# Patient Record
Sex: Female | Born: 1935
Health system: Southern US, Community
[De-identification: ages and names within clinical notes are randomized; demographics above are authoritative.]

## PROBLEM LIST (undated history)

## (undated) DIAGNOSIS — W57XXXA Bitten or stung by nonvenomous insect and other nonvenomous arthropods, initial encounter: Secondary | ICD-10-CM

## (undated) DIAGNOSIS — K635 Polyp of colon: Secondary | ICD-10-CM

## (undated) DIAGNOSIS — R350 Frequency of micturition: Secondary | ICD-10-CM

## (undated) DIAGNOSIS — M8580 Other specified disorders of bone density and structure, unspecified site: Secondary | ICD-10-CM

## (undated) DIAGNOSIS — L03115 Cellulitis of right lower limb: Secondary | ICD-10-CM

## (undated) DIAGNOSIS — H409 Unspecified glaucoma: Secondary | ICD-10-CM

## (undated) DIAGNOSIS — R197 Diarrhea, unspecified: Secondary | ICD-10-CM

## (undated) DIAGNOSIS — N809 Endometriosis, unspecified: Secondary | ICD-10-CM

## (undated) DIAGNOSIS — G43909 Migraine, unspecified, not intractable, without status migrainosus: Secondary | ICD-10-CM

## (undated) DIAGNOSIS — M545 Low back pain, unspecified: Secondary | ICD-10-CM

## (undated) DIAGNOSIS — T8859XA Other complications of anesthesia, initial encounter: Secondary | ICD-10-CM

## (undated) DIAGNOSIS — J479 Bronchiectasis, uncomplicated: Secondary | ICD-10-CM

## (undated) DIAGNOSIS — K219 Gastro-esophageal reflux disease without esophagitis: Secondary | ICD-10-CM

## (undated) DIAGNOSIS — Z973 Presence of spectacles and contact lenses: Secondary | ICD-10-CM

## (undated) DIAGNOSIS — J302 Other seasonal allergic rhinitis: Secondary | ICD-10-CM

## (undated) DIAGNOSIS — D473 Essential (hemorrhagic) thrombocythemia: Principal | ICD-10-CM

## (undated) DIAGNOSIS — Z972 Presence of dental prosthetic device (complete) (partial): Secondary | ICD-10-CM

## (undated) DIAGNOSIS — G250 Essential tremor: Secondary | ICD-10-CM

## (undated) HISTORY — DX: Essential tremor: G25.0

## (undated) HISTORY — DX: Unspecified glaucoma: H40.9

## (undated) HISTORY — DX: Bitten or stung by nonvenomous insect and other nonvenomous arthropods, initial encounter: W57.XXXA

## (undated) HISTORY — DX: Essential (hemorrhagic) thrombocythemia: D47.3

## (undated) HISTORY — DX: Migraine, unspecified, not intractable, without status migrainosus: G43.909

## (undated) HISTORY — PX: TUBAL LIGATION: SHX77

## (undated) HISTORY — DX: Low back pain, unspecified: M54.50

## (undated) HISTORY — DX: Other seasonal allergic rhinitis: J30.2

## (undated) HISTORY — DX: Endometriosis, unspecified: N80.9

## (undated) HISTORY — DX: Low back pain: M54.5

## (undated) HISTORY — DX: Polyp of colon: K63.5

## (undated) HISTORY — DX: Other specified disorders of bone density and structure, unspecified site: M85.80

## (undated) HISTORY — PX: OTHER SURGICAL HISTORY: SHX169

## (undated) HISTORY — DX: Gastro-esophageal reflux disease without esophagitis: K21.9

---

## 1968-09-10 HISTORY — PX: TOTAL ABDOMINAL HYSTERECTOMY: SHX209

## 1998-09-11 ENCOUNTER — Other Ambulatory Visit: Admission: RE | Admit: 1998-09-11 | Discharge: 1998-09-11 | Payer: Self-pay | Admitting: *Deleted

## 1999-04-29 ENCOUNTER — Emergency Department (HOSPITAL_COMMUNITY): Admission: EM | Admit: 1999-04-29 | Discharge: 1999-04-29 | Payer: Self-pay | Admitting: Emergency Medicine

## 1999-04-29 ENCOUNTER — Encounter: Payer: Self-pay | Admitting: Emergency Medicine

## 2006-03-07 ENCOUNTER — Encounter: Admission: RE | Admit: 2006-03-07 | Discharge: 2006-03-07 | Payer: Self-pay | Admitting: General Surgery

## 2010-07-20 ENCOUNTER — Emergency Department (HOSPITAL_COMMUNITY)
Admission: EM | Admit: 2010-07-20 | Discharge: 2010-07-21 | Disposition: A | Discharge status: Home / Self Care | Payer: Medicare Other | Attending: Emergency Medicine | Admitting: Emergency Medicine

## 2010-07-20 DIAGNOSIS — N390 Urinary tract infection, site not specified: Secondary | ICD-10-CM | POA: Insufficient documentation

## 2010-07-20 DIAGNOSIS — H409 Unspecified glaucoma: Secondary | ICD-10-CM | POA: Insufficient documentation

## 2010-07-20 DIAGNOSIS — R112 Nausea with vomiting, unspecified: Secondary | ICD-10-CM | POA: Insufficient documentation

## 2010-07-21 LAB — BASIC METABOLIC PANEL
BUN: 20 mg/dL (ref 6–23)
CO2: 24 mEq/L (ref 19–32)
Calcium: 9.9 mg/dL (ref 8.4–10.5)
Chloride: 98 mEq/L (ref 96–112)
Creatinine, Ser: 0.68 mg/dL (ref 0.50–1.10)
GFR calc Af Amer: >60 mL/min (ref >60)
GFR calc non Af Amer: >60 mL/min (ref >60)
Glucose, Bld: 140 mg/dL — ABNORMAL HIGH (ref 70–99)
Potassium: 3.7 mEq/L (ref 3.5–5.1)
Sodium: 134 mEq/L — ABNORMAL LOW (ref 135–145)

## 2010-07-21 LAB — URINALYSIS, ROUTINE W REFLEX MICROSCOPIC
Bilirubin Urine: NEGATIVE
Glucose, UA: NEGATIVE mg/dL
Hgb urine dipstick: NEGATIVE
Ketones, ur: 15 mg/dL — AB
Nitrite: NEGATIVE
Protein, ur: NEGATIVE mg/dL
Specific Gravity, Urine: 1.023 (ref 1.005–1.030)
Urobilinogen, UA: 0.2 mg/dL (ref 0.0–1.0)
pH: 6 (ref 5.0–8.0)

## 2010-07-21 LAB — URINE MICROSCOPIC-ADD ON

## 2010-07-22 LAB — URINE CULTURE: Culture  Setup Time: 201207110529

## 2010-08-17 ENCOUNTER — Other Ambulatory Visit: Payer: Self-pay | Admitting: Family Medicine

## 2010-08-17 ENCOUNTER — Ambulatory Visit
Admission: RE | Admit: 2010-08-17 | Discharge: 2010-08-17 | Disposition: A | Discharge status: Home / Self Care | Payer: Medicare Other | Source: Ambulatory Visit | Attending: Family Medicine | Admitting: Family Medicine

## 2010-08-17 DIAGNOSIS — M545 Low back pain, unspecified: Secondary | ICD-10-CM

## 2011-02-15 DIAGNOSIS — H6980 Other specified disorders of Eustachian tube, unspecified ear: Secondary | ICD-10-CM | POA: Diagnosis not present

## 2011-05-02 DIAGNOSIS — T148 Other injury of unspecified body region: Secondary | ICD-10-CM | POA: Diagnosis not present

## 2011-05-02 DIAGNOSIS — W57XXXA Bitten or stung by nonvenomous insect and other nonvenomous arthropods, initial encounter: Secondary | ICD-10-CM | POA: Diagnosis not present

## 2011-05-03 DIAGNOSIS — H409 Unspecified glaucoma: Secondary | ICD-10-CM | POA: Diagnosis not present

## 2011-05-03 DIAGNOSIS — H4011X0 Primary open-angle glaucoma, stage unspecified: Secondary | ICD-10-CM | POA: Diagnosis not present

## 2011-05-24 DIAGNOSIS — L259 Unspecified contact dermatitis, unspecified cause: Secondary | ICD-10-CM | POA: Diagnosis not present

## 2011-06-03 DIAGNOSIS — H4011X0 Primary open-angle glaucoma, stage unspecified: Secondary | ICD-10-CM | POA: Diagnosis not present

## 2011-06-03 DIAGNOSIS — H2510 Age-related nuclear cataract, unspecified eye: Secondary | ICD-10-CM | POA: Diagnosis not present

## 2011-06-03 DIAGNOSIS — H409 Unspecified glaucoma: Secondary | ICD-10-CM | POA: Diagnosis not present

## 2011-06-17 DIAGNOSIS — H409 Unspecified glaucoma: Secondary | ICD-10-CM | POA: Diagnosis not present

## 2011-06-17 DIAGNOSIS — H4011X0 Primary open-angle glaucoma, stage unspecified: Secondary | ICD-10-CM | POA: Diagnosis not present

## 2011-08-08 DIAGNOSIS — G43009 Migraine without aura, not intractable, without status migrainosus: Secondary | ICD-10-CM | POA: Diagnosis not present

## 2011-08-08 DIAGNOSIS — J301 Allergic rhinitis due to pollen: Secondary | ICD-10-CM | POA: Diagnosis not present

## 2011-08-08 DIAGNOSIS — Z23 Encounter for immunization: Secondary | ICD-10-CM | POA: Diagnosis not present

## 2011-08-23 DIAGNOSIS — J019 Acute sinusitis, unspecified: Secondary | ICD-10-CM | POA: Diagnosis not present

## 2011-08-30 DIAGNOSIS — J342 Deviated nasal septum: Secondary | ICD-10-CM | POA: Diagnosis not present

## 2011-08-30 DIAGNOSIS — J019 Acute sinusitis, unspecified: Secondary | ICD-10-CM | POA: Diagnosis not present

## 2011-08-30 DIAGNOSIS — J310 Chronic rhinitis: Secondary | ICD-10-CM | POA: Diagnosis not present

## 2011-10-28 DIAGNOSIS — H409 Unspecified glaucoma: Secondary | ICD-10-CM | POA: Diagnosis not present

## 2011-10-28 DIAGNOSIS — H4011X0 Primary open-angle glaucoma, stage unspecified: Secondary | ICD-10-CM | POA: Diagnosis not present

## 2011-12-16 DIAGNOSIS — G43009 Migraine without aura, not intractable, without status migrainosus: Secondary | ICD-10-CM | POA: Diagnosis not present

## 2011-12-28 DIAGNOSIS — S0993XA Unspecified injury of face, initial encounter: Secondary | ICD-10-CM | POA: Diagnosis not present

## 2011-12-30 DIAGNOSIS — L0201 Cutaneous abscess of face: Secondary | ICD-10-CM | POA: Diagnosis not present

## 2011-12-30 DIAGNOSIS — K130 Diseases of lips: Secondary | ICD-10-CM | POA: Diagnosis not present

## 2011-12-30 DIAGNOSIS — L03211 Cellulitis of face: Secondary | ICD-10-CM | POA: Diagnosis not present

## 2012-01-24 DIAGNOSIS — L0201 Cutaneous abscess of face: Secondary | ICD-10-CM | POA: Diagnosis not present

## 2012-01-24 DIAGNOSIS — L03211 Cellulitis of face: Secondary | ICD-10-CM | POA: Diagnosis not present

## 2012-02-27 DIAGNOSIS — H409 Unspecified glaucoma: Secondary | ICD-10-CM | POA: Diagnosis not present

## 2012-02-27 DIAGNOSIS — H4011X0 Primary open-angle glaucoma, stage unspecified: Secondary | ICD-10-CM | POA: Diagnosis not present

## 2012-03-05 DIAGNOSIS — L03211 Cellulitis of face: Secondary | ICD-10-CM | POA: Diagnosis not present

## 2012-03-05 DIAGNOSIS — L0201 Cutaneous abscess of face: Secondary | ICD-10-CM | POA: Diagnosis not present

## 2012-05-09 DIAGNOSIS — Z01419 Encounter for gynecological examination (general) (routine) without abnormal findings: Secondary | ICD-10-CM | POA: Diagnosis not present

## 2012-05-09 DIAGNOSIS — B373 Candidiasis of vulva and vagina: Secondary | ICD-10-CM | POA: Diagnosis not present

## 2012-05-09 DIAGNOSIS — N951 Menopausal and female climacteric states: Secondary | ICD-10-CM | POA: Diagnosis not present

## 2012-05-09 DIAGNOSIS — M899 Disorder of bone, unspecified: Secondary | ICD-10-CM | POA: Diagnosis not present

## 2012-05-09 DIAGNOSIS — Z124 Encounter for screening for malignant neoplasm of cervix: Secondary | ICD-10-CM | POA: Diagnosis not present

## 2012-07-09 DIAGNOSIS — H409 Unspecified glaucoma: Secondary | ICD-10-CM | POA: Diagnosis not present

## 2012-07-09 DIAGNOSIS — H4011X0 Primary open-angle glaucoma, stage unspecified: Secondary | ICD-10-CM | POA: Diagnosis not present

## 2012-07-16 DIAGNOSIS — R229 Localized swelling, mass and lump, unspecified: Secondary | ICD-10-CM | POA: Diagnosis not present

## 2012-07-24 DIAGNOSIS — H01009 Unspecified blepharitis unspecified eye, unspecified eyelid: Secondary | ICD-10-CM | POA: Diagnosis not present

## 2012-07-26 DIAGNOSIS — IMO0001 Reserved for inherently not codable concepts without codable children: Secondary | ICD-10-CM | POA: Diagnosis not present

## 2012-08-09 DIAGNOSIS — H409 Unspecified glaucoma: Secondary | ICD-10-CM | POA: Diagnosis not present

## 2012-08-09 DIAGNOSIS — H4011X0 Primary open-angle glaucoma, stage unspecified: Secondary | ICD-10-CM | POA: Diagnosis not present

## 2012-08-16 DIAGNOSIS — M949 Disorder of cartilage, unspecified: Secondary | ICD-10-CM | POA: Diagnosis not present

## 2012-08-16 DIAGNOSIS — M899 Disorder of bone, unspecified: Secondary | ICD-10-CM | POA: Diagnosis not present

## 2012-08-16 DIAGNOSIS — Z1231 Encounter for screening mammogram for malignant neoplasm of breast: Secondary | ICD-10-CM | POA: Diagnosis not present

## 2012-10-08 DIAGNOSIS — H6980 Other specified disorders of Eustachian tube, unspecified ear: Secondary | ICD-10-CM | POA: Diagnosis not present

## 2012-10-11 DIAGNOSIS — Z23 Encounter for immunization: Secondary | ICD-10-CM | POA: Diagnosis not present

## 2012-10-17 DIAGNOSIS — R229 Localized swelling, mass and lump, unspecified: Secondary | ICD-10-CM | POA: Diagnosis not present

## 2012-10-18 DIAGNOSIS — H93299 Other abnormal auditory perceptions, unspecified ear: Secondary | ICD-10-CM | POA: Diagnosis not present

## 2012-10-18 DIAGNOSIS — J310 Chronic rhinitis: Secondary | ICD-10-CM | POA: Diagnosis not present

## 2012-10-18 DIAGNOSIS — H6980 Other specified disorders of Eustachian tube, unspecified ear: Secondary | ICD-10-CM | POA: Diagnosis not present

## 2012-10-18 DIAGNOSIS — R52 Pain, unspecified: Secondary | ICD-10-CM | POA: Diagnosis not present

## 2012-11-28 DIAGNOSIS — H01009 Unspecified blepharitis unspecified eye, unspecified eyelid: Secondary | ICD-10-CM | POA: Diagnosis not present

## 2012-12-13 DIAGNOSIS — H4011X0 Primary open-angle glaucoma, stage unspecified: Secondary | ICD-10-CM | POA: Diagnosis not present

## 2012-12-13 DIAGNOSIS — H409 Unspecified glaucoma: Secondary | ICD-10-CM | POA: Diagnosis not present

## 2012-12-20 DIAGNOSIS — J069 Acute upper respiratory infection, unspecified: Secondary | ICD-10-CM | POA: Diagnosis not present

## 2013-01-15 DIAGNOSIS — H2510 Age-related nuclear cataract, unspecified eye: Secondary | ICD-10-CM | POA: Diagnosis not present

## 2013-01-15 DIAGNOSIS — H409 Unspecified glaucoma: Secondary | ICD-10-CM | POA: Diagnosis not present

## 2013-01-15 DIAGNOSIS — H4011X0 Primary open-angle glaucoma, stage unspecified: Secondary | ICD-10-CM | POA: Diagnosis not present

## 2013-01-18 DIAGNOSIS — M25579 Pain in unspecified ankle and joints of unspecified foot: Secondary | ICD-10-CM | POA: Diagnosis not present

## 2013-02-07 DIAGNOSIS — H2510 Age-related nuclear cataract, unspecified eye: Secondary | ICD-10-CM | POA: Diagnosis not present

## 2013-02-07 DIAGNOSIS — H2589 Other age-related cataract: Secondary | ICD-10-CM | POA: Diagnosis not present

## 2013-02-07 HISTORY — PX: CATARACT EXTRACTION: SUR2

## 2013-02-12 DIAGNOSIS — G43009 Migraine without aura, not intractable, without status migrainosus: Secondary | ICD-10-CM | POA: Diagnosis not present

## 2013-02-12 DIAGNOSIS — R197 Diarrhea, unspecified: Secondary | ICD-10-CM | POA: Diagnosis not present

## 2013-02-28 DIAGNOSIS — H2510 Age-related nuclear cataract, unspecified eye: Secondary | ICD-10-CM | POA: Diagnosis not present

## 2013-02-28 DIAGNOSIS — H2589 Other age-related cataract: Secondary | ICD-10-CM | POA: Diagnosis not present

## 2013-05-15 DIAGNOSIS — J329 Chronic sinusitis, unspecified: Secondary | ICD-10-CM | POA: Diagnosis not present

## 2013-07-01 DIAGNOSIS — H01009 Unspecified blepharitis unspecified eye, unspecified eyelid: Secondary | ICD-10-CM | POA: Diagnosis not present

## 2013-07-01 DIAGNOSIS — H409 Unspecified glaucoma: Secondary | ICD-10-CM | POA: Diagnosis not present

## 2013-07-01 DIAGNOSIS — H4011X0 Primary open-angle glaucoma, stage unspecified: Secondary | ICD-10-CM | POA: Diagnosis not present

## 2013-08-14 DIAGNOSIS — H4011X0 Primary open-angle glaucoma, stage unspecified: Secondary | ICD-10-CM | POA: Diagnosis not present

## 2013-09-10 DIAGNOSIS — D75839 Thrombocytosis, unspecified: Secondary | ICD-10-CM

## 2013-09-10 HISTORY — DX: Thrombocytosis, unspecified: D75.839

## 2013-09-12 DIAGNOSIS — Z23 Encounter for immunization: Secondary | ICD-10-CM | POA: Diagnosis not present

## 2013-09-12 DIAGNOSIS — R259 Unspecified abnormal involuntary movements: Secondary | ICD-10-CM | POA: Diagnosis not present

## 2013-09-12 DIAGNOSIS — R5383 Other fatigue: Secondary | ICD-10-CM | POA: Diagnosis not present

## 2013-09-12 DIAGNOSIS — R5381 Other malaise: Secondary | ICD-10-CM | POA: Diagnosis not present

## 2013-09-12 DIAGNOSIS — R799 Abnormal finding of blood chemistry, unspecified: Secondary | ICD-10-CM | POA: Diagnosis not present

## 2013-09-12 DIAGNOSIS — R197 Diarrhea, unspecified: Secondary | ICD-10-CM | POA: Diagnosis not present

## 2013-09-13 DIAGNOSIS — L570 Actinic keratosis: Secondary | ICD-10-CM | POA: Diagnosis not present

## 2013-09-13 DIAGNOSIS — L821 Other seborrheic keratosis: Secondary | ICD-10-CM | POA: Diagnosis not present

## 2013-09-18 DIAGNOSIS — R7989 Other specified abnormal findings of blood chemistry: Secondary | ICD-10-CM | POA: Diagnosis not present

## 2013-09-20 ENCOUNTER — Telehealth: Payer: Self-pay | Admitting: Hematology

## 2013-09-20 NOTE — Telephone Encounter (Signed)
C/D 09/20/13 for appt. 10/01/13

## 2013-09-20 NOTE — Telephone Encounter (Signed)
S/W PATIENT AND GAVE NP APPT FOR 09/22 @ 1:30 W/DR. SEHBAI.

## 2013-10-01 ENCOUNTER — Encounter: Payer: Self-pay | Admitting: Hematology

## 2013-10-01 ENCOUNTER — Ambulatory Visit (HOSPITAL_BASED_OUTPATIENT_CLINIC_OR_DEPARTMENT_OTHER): Payer: Medicare Other | Admitting: Hematology

## 2013-10-01 ENCOUNTER — Telehealth: Payer: Self-pay | Admitting: Hematology

## 2013-10-01 ENCOUNTER — Encounter (INDEPENDENT_AMBULATORY_CARE_PROVIDER_SITE_OTHER): Payer: Self-pay

## 2013-10-01 ENCOUNTER — Ambulatory Visit: Payer: Medicare Other

## 2013-10-01 VITALS — BP 125/69 | HR 75 | Temp 98.0°F | Resp 18 | Wt 130.2 lb

## 2013-10-01 DIAGNOSIS — D473 Essential (hemorrhagic) thrombocythemia: Secondary | ICD-10-CM | POA: Diagnosis not present

## 2013-10-01 DIAGNOSIS — D75839 Thrombocytosis, unspecified: Secondary | ICD-10-CM

## 2013-10-01 NOTE — Progress Notes (Signed)
[name] HEMATOLOGY CONSULT NOTE 10/01/2013  Patient Care Team: Carlos Levering, PA-C as PCP - General (Family Medicine) Khori Rosevear Marla Roe, MD as Consulting Physician (Hematology)  CHIEF COMPLAINTS/PURPOSE OF CONSULTATION:   Thrombocytosis  HISTORY OF PRESENTING ILLNESS:   Chelsea Garcia 80 y.o. female  from [place] who is being referred here for an elevated platelet count. She recently had blood testing done by her primary care Carlos Levering PA. CBC collected on 09/12/2013 showed a white count of 10.1, hemoglobin 17.4 g, hematocrit 54.7, MCV 87.5, and platelet count of 515. A repeat CBC was done on 09/18/2013. It showed a white count 11.5, hemoglobin 16.5, hematocrit 51, MCV 86 and platelet count was 546. Patient does not have any iron deficiency. She does not have any stressors such as recent surgery, inflammation, infection which can lead to reactive thrombocytosis. Her metabolic panel showed normal glucose, normal kidney function, electrolytes and liver function panel. A serum ferritin was 112. B12 was 521 normal. TSH 7.65 mildly elevated consistent with hypothyroidism. Free T4 0.86 normal. Vitamin D level was 37.2. Tissue transglutaminase IgA was less than 2.  Patient has been very healthy and does not suffer from any chronic ailments. She does have a history of an irritable bowel and bowel habits sent to alternate between constipation and diarrhea. She occasionally gets migraine headaches. She is going to go for a colonoscopy. She does have some fatigue symptoms. She has had tick bites going out door in [place]. She has no prior history of blood disorder. She does have a chronic low back pain. She denies any fevers night sweats or weight loss. She does have history of glaucoma and a cataract which was removed this year. She has never had a cardiovascular event such as MI, CVA, TIA, DVT. She does not take aspirin. She is here for further evaluation.  MEDICAL  HISTORY:  Past Medical History  Diagnosis Date  . Thrombocytosis September 2015  . Migraine     started in 50's  . Glaucoma     started in 40's  . Essential tremor     since 40's  . Seasonal allergies     to Dust, mold and pollen.  . Low back pain     since 40's  . Osteopenia     2014  . Endometriosis     required TAH when young  . Tick bites     she likes out door activities, 2 this year (2015)  . Colon polyps     Benign. 2 colonoscopies in past. Due now again.    SURGICAL HISTORY: Past Surgical History  Procedure Laterality Date  . Total abdominal hysterectomy  1970's  . Tubal ligation    . Cataract extraction Left 02/07/2013    SOCIAL HISTORY: History   Social History  . Marital Status: Married    Spouse Name: N/A    Number of Children: N/A  . Years of Education: N/A   Occupational History  . Not on file.   Social History Main Topics  . Smoking status: Former Smoker -- 0.25 packs/day    Types: Cigarettes    Quit date: 07/01/1961  . Smokeless tobacco: Not on file  . Alcohol Use: No  . Drug Use: No  . Sexual Activity: Yes   Other Topics Concern  . Not on file   Social History Narrative   Patient lives in [place]. Married. Came by herself. She has got 2 sons ages 73 and 67 and 2 grankids  boy 22 and daughter 99. She is active, walks every day, semi-retired accountant.     FAMILY HISTORY: Family History  Problem Relation Age of Onset  . Thyroid disease Mother   . Cancer Father   . Cancer Maternal Aunt 80    ALLERGIES:  has No Known Allergies.  MEDICATIONS:  Current Outpatient Prescriptions  Medication Sig Dispense Refill  . azelastine (ASTELIN) 0.1 % nasal spray Place 1 spray into both nostrils 2 (two) times daily. Use in each nostril as directed      . bacitracin ophthalmic ointment Place 1 application into both eyes 4 (four) times daily. Rub on eyelids      . bimatoprost (LUMIGAN) 0.03 % ophthalmic solution Place 1 drop into both eyes at  bedtime.      . bismuth subsalicylate (PEPTO BISMOL) 262 MG/15ML suspension Take 30 mLs by mouth every 6 (six) hours as needed for diarrhea or loose stools.      . dorzolamide-timolol (COSOPT) 22.3-6.8 MG/ML ophthalmic solution Place 1 drop into both eyes 2 (two) times daily.      . naproxen sodium (ANAPROX) 220 MG tablet Take 220 mg by mouth 2 (two) times daily as needed. This is Aleve.      . SUMAtriptan (IMITREX) 100 MG tablet Take 100 mg by mouth every 2 (two) hours as needed for migraine or headache. May repeat in 2 hours if headache persists or recurs.       No current facility-administered medications for this visit.    REVIEW OF SYSTEMS:   Constitutional: Denies fevers, chills or abnormal night sweats, + Fatigue Eyes: Denies blurriness of vision, double vision or watery eyes Ears, nose, mouth, throat, and face: Denies mucositis or sore throat Respiratory: Denies cough, dyspnea or wheezes, +seasonal allergies Cardiovascular: Denies palpitation, chest discomfort or lower extremity swelling Gastrointestinal:  Denies nausea, heartburn,+ change in bowel habits constipation alt with diarrhea Skin: Denies abnormal skin rashes Lymphatics: Denies new lymphadenopathy or easy bruising Neurological:Denies numbness, tingling or new weaknesses, low back pain Behavioral/Psych: Mood is stable, no new changes  All other systems were reviewed with the patient and are negative.  PHYSICAL EXAMINATION: ECOG PERFORMANCE STATUS: 0  Filed Vitals:   10/01/13 1355  BP: 125/69  Pulse: 75  Temp: 98 F (36.7 C)  Resp: 18   Filed Weights   10/01/13 1355  Weight: 130 lb 3.2 oz (59.058 kg)    GENERAL:alert, no distress and comfortable SKIN: skin color, texture, turgor are normal, no rashes or significant lesions EYES: normal, conjunctiva are pink and non-injected, sclera clear OROPHARYNX:no exudate, no erythema and lips, buccal mucosa, and tongue normal  NECK: supple, thyroid normal size,  non-tender, without nodularity LYMPH:  no palpable lymphadenopathy in the cervical, axillary or inguinal LUNGS: clear to auscultation and percussion with normal breathing effort HEART: regular rate & rhythm and no murmurs and no lower extremity edema ABDOMEN:abdomen soft, non-tender and normal bowel sounds, no HSM Musculoskeletal:no cyanosis of digits and no clubbing  PSYCH: alert & oriented x 3 with fluent speech NEURO: no focal motor/sensory deficits  LABORATORY DATA:  I have reviewed the data as listed  CBC results listed in HPI.  RADIOGRAPHIC STUDIES:  08/17/2010 LUMBAR SPINE - COMPLETE 4+ VIEW Comparison: Left hip film of 08/30/2006 Findings: There is slight exaggeration of the lumbar lordosis. There is degenerative disc disease particularly at L2-3 and L3-4 levels with loss of disc space, sclerosis, and spurring. There is some degenerative change involving the facet joints particularly of L4-5  and L5-S1. The SI joints appear normal. The bowel gas pattern is nonspecific.  IMPRESSION: Degenerative disc disease primarily at L2-3 and L3-4. No acute abnormality.    ASSESSMENT & PLAN:   1. Rhetta Cleek is a pleasant 80 years old female in good health who is being referred here for an abnormal high platelet count.  2. On 2 occasions, she had an elevated platelet count, a phenomenon known as thrombocytosis.  3. Thrombocytosis can increase the risk for cardiovascular events like MI, TIA, CVA, DVT. That is why it is important to treated and control the platelet count and try to keep it in a normal range.  4. One of the common causes for thrombocytosis is iron deficiency. Patient's ferritin level was normal. The other differential includes the possibility of a myeloproliferative disorder such as essential thrombocytosis also known as ET. It is a clonal disorder and can persist for many years. It also increases the risk for myelofibrosis and acute leukemia.   5. An important differential is CML  or chronic myeloid leukemia which can also manifest as an elevated platelet count. I will check a blood test called JAK2 mutation for ET and another test called BCR ABL for CML.   6. I will also order the iron studies and ferritin to be repeated.  7. I advised the patient to start taking an aspirin 325 mg daily for thromboprophylaxis.   8. I am repeating her CBC in 2 months. I will also check a comprehensive metabolic panel, sedimentation rate, serum LDH at the time of followup.  9. Sometimes the thrombocytosis is reactive and may correct itself without any intervention and a two-month followup and establish whether it is our persistent problem or a temporary one. We can also consider the use of hydroxyurea which can effectively bring down the platelet count in the normal range.  10. Patient should continue getting age appropriate screening for colon cancer, breast cancer, cervical cancer.  11. She was also told to avoid going outdoor in the forest and woods because of her history of tick bites and wear proper clothing.  12. I will keep you posted about her progress as her clinical course evolves.  Orders Placed This Encounter  Procedures  . CBC with Differential    Standing Status: Future     Number of Occurrences:      Standing Expiration Date: 10/02/2015  . Sedimentation rate    Standing Status: Future     Number of Occurrences:      Standing Expiration Date: 10/02/2015  . Lactate dehydrogenase (LDH) - [facility]    Standing Status: Future     Number of Occurrences:      Standing Expiration Date: 10/02/2015  . Comprehensive metabolic panel (Cmet) - [facility]    Standing Status: Future     Number of Occurrences:      Standing Expiration Date: 10/02/2015  . bcr/abl    Standing Status: Future     Number of Occurrences:      Standing Expiration Date: 10/02/2015  . [id number]    Standing Status: Future     Number of Occurrences:      Standing Expiration Date: 10/02/2015  . Ferritin     Standing Status: Future     Number of Occurrences:      Standing Expiration Date: 10/02/2015  . Iron and TIBC [facility]    Standing Status: Future     Number of Occurrences:      Standing Expiration Date: 10/02/2015  All questions were answered. The patient knows to call the clinic with any problems, questions or concerns. I spent 45 minutes counseling the patient face to face. The total time spent in the appointment was 60 minutes and more than 50% was on counseling.    Bernadene Bell, MD Medical Hematologist/Oncologist [place] Pager: [phone] Office No: [phone]

## 2013-10-01 NOTE — Progress Notes (Signed)
Checked in new pt with no financial concerns.  Pt has 2 insurances so no financial assistance should be needed but she has Raquel's card for any future questions or concerns.

## 2013-10-01 NOTE — Telephone Encounter (Signed)
Pt confirmed labs/ov per 09/22 POF, gave pt AVS.....KJ °

## 2013-10-07 DIAGNOSIS — R5381 Other malaise: Secondary | ICD-10-CM | POA: Diagnosis not present

## 2013-10-07 DIAGNOSIS — R5383 Other fatigue: Secondary | ICD-10-CM | POA: Diagnosis not present

## 2013-10-30 DIAGNOSIS — R5383 Other fatigue: Secondary | ICD-10-CM | POA: Diagnosis not present

## 2013-11-14 ENCOUNTER — Telehealth: Payer: Self-pay | Admitting: Hematology

## 2013-11-14 NOTE — Telephone Encounter (Signed)
MOVED 11/17 APPT TO 11/18 DUE TO CALL DAY. S/W PT SHE IS AWARE - DAY PER PT CANNOT COME 12PM ON 11/17

## 2013-11-26 ENCOUNTER — Other Ambulatory Visit: Payer: Medicare Other

## 2013-11-26 ENCOUNTER — Ambulatory Visit: Payer: Medicare Other

## 2013-11-27 ENCOUNTER — Ambulatory Visit (HOSPITAL_BASED_OUTPATIENT_CLINIC_OR_DEPARTMENT_OTHER): Payer: Medicare Other | Admitting: Hematology

## 2013-11-27 ENCOUNTER — Encounter: Payer: Self-pay | Admitting: Hematology

## 2013-11-27 ENCOUNTER — Telehealth: Payer: Self-pay | Admitting: Hematology

## 2013-11-27 ENCOUNTER — Other Ambulatory Visit (HOSPITAL_BASED_OUTPATIENT_CLINIC_OR_DEPARTMENT_OTHER): Payer: Medicare Other

## 2013-11-27 VITALS — BP 130/65 | HR 67 | Temp 98.0°F | Resp 18 | Ht 62.0 in | Wt 135.1 lb

## 2013-11-27 DIAGNOSIS — D473 Essential (hemorrhagic) thrombocythemia: Secondary | ICD-10-CM

## 2013-11-27 DIAGNOSIS — D649 Anemia, unspecified: Secondary | ICD-10-CM

## 2013-11-27 DIAGNOSIS — D72829 Elevated white blood cell count, unspecified: Secondary | ICD-10-CM

## 2013-11-27 DIAGNOSIS — D75839 Thrombocytosis, unspecified: Secondary | ICD-10-CM

## 2013-11-27 LAB — COMPREHENSIVE METABOLIC PANEL (CC13)
ALT: 16 U/L (ref 0–55)
AST: 21 U/L (ref 5–34)
Albumin: 4.3 g/dL (ref 3.5–5.0)
Alkaline Phosphatase: 62 U/L (ref 40–150)
Anion Gap: 9 mEq/L (ref 3–11)
BILIRUBIN TOTAL: 0.49 mg/dL (ref 0.20–1.20)
BUN: 23 mg/dL (ref 7.0–26.0)
CHLORIDE: 106 meq/L (ref 98–109)
CO2: 28 mEq/L (ref 22–29)
CREATININE: 0.8 mg/dL (ref 0.6–1.1)
Calcium: 10.1 mg/dL (ref 8.4–10.4)
Glucose: 89 mg/dl (ref 70–140)
Potassium: 4.6 mEq/L (ref 3.5–5.1)
Sodium: 143 mEq/L (ref 136–145)
Total Protein: 6.8 g/dL (ref 6.4–8.3)

## 2013-11-27 LAB — CBC WITH DIFFERENTIAL/PLATELET
BASO%: 0.3 % (ref 0.0–2.0)
Basophils Absolute: 0 10*3/uL (ref 0.0–0.1)
EOS ABS: 0.2 10*3/uL (ref 0.0–0.5)
EOS%: 1.5 % (ref 0.0–7.0)
HCT: 45.9 % (ref 34.8–46.6)
HGB: 15.1 g/dL (ref 11.6–15.9)
LYMPH#: 0.9 10*3/uL (ref 0.9–3.3)
LYMPH%: 7.9 % — ABNORMAL LOW (ref 14.0–49.7)
MCH: 30.1 pg (ref 25.1–34.0)
MCHC: 32.9 g/dL (ref 31.5–36.0)
MCV: 91.6 fL (ref 79.5–101.0)
MONO#: 0.5 10*3/uL (ref 0.1–0.9)
MONO%: 3.9 % (ref 0.0–14.0)
NEUT%: 86.4 % — ABNORMAL HIGH (ref 38.4–76.8)
NEUTROS ABS: 10.1 10*3/uL — AB (ref 1.5–6.5)
NRBC: 0 % (ref 0–0)
Platelets: 646 10*3/uL — ABNORMAL HIGH (ref 145–400)
RBC: 5.01 10*6/uL (ref 3.70–5.45)
RDW: 17.8 % — AB (ref 11.2–14.5)
WBC: 11.7 10*3/uL — ABNORMAL HIGH (ref 3.9–10.3)

## 2013-11-27 LAB — IRON AND TIBC CHCC
%SAT: 46 % (ref 21–57)
Iron: 112 ug/dL (ref 41–142)
TIBC: 245 ug/dL (ref 236–444)
UIBC: 133 ug/dL (ref 120–384)

## 2013-11-27 LAB — SEDIMENTATION RATE: SED RATE: 1 mm/h (ref 0–22)

## 2013-11-27 LAB — LACTATE DEHYDROGENASE (CC13): LDH: 319 U/L — AB (ref 125–245)

## 2013-11-27 LAB — FERRITIN CHCC: Ferritin: 291 ng/ml — ABNORMAL HIGH (ref 9–269)

## 2013-11-27 MED ORDER — HYDROXYUREA 500 MG PO CAPS: 500.0000 mg | ORAL_CAPSULE | Freq: Every day | ORAL | Status: DC

## 2013-11-27 NOTE — Telephone Encounter (Signed)
Gave avs & cal for Dec. °

## 2013-11-27 NOTE — Patient Instructions (Signed)
Written info on hydrea given

## 2013-11-27 NOTE — Progress Notes (Signed)
Chelsea Garcia HEMATOLOGY FOLLOW UP NOTE DATE OF VISIT: 11/27/2013  Patient Care Team: Carlos Levering, PA-C as PCP - General (Family Medicine) Jamarkis Branam Marla Roe, MD as Consulting Physician (Hematology)  CHIEF COMPLAINTS/PURPOSE OF CONSULTATION:   Thrombocytosis  HISTORY OF PRESENTING ILLNESS:   Chelsea Garcia 80 y.o. female  from [place] who was being referred here for an elevated platelet count and seen on 10/01/2013. She recently had blood testing done by her primary care Carlos Levering PA. CBC collected on 09/12/2013 showed a white count of 10.1, hemoglobin 17.4 g, hematocrit 54.7, MCV 87.5, and platelet count of 515. A repeat CBC was done on 09/18/2013. It showed a white count 11.5, hemoglobin 16.5, hematocrit 51, MCV 86 and platelet count was 546. Patient does not have any iron deficiency. She does not have any stressors such as recent surgery, inflammation, infection which can lead to reactive thrombocytosis. Her metabolic panel showed normal glucose, normal kidney function, electrolytes and liver function panel. A serum ferritin was 112. B12 was 521 normal. TSH 7.65 mildly elevated consistent with hypothyroidism. Free T4 0.86 normal. Vitamin D level was 37.2. Tissue transglutaminase IgA was less than 2.  Patient has been very healthy and does not suffer from any chronic ailments. She does have a history of an irritable bowel and bowel habits sent to alternate between constipation and diarrhea. She occasionally gets migraine headaches. She is going to go for a colonoscopy. She does have some fatigue symptoms. She has had tick bites going out door in [place]. She has no prior history of blood disorder. She does have a chronic low back pain. She denies any fevers night sweats or weight loss. She does have history of glaucoma and a cataract which was removed this year. She has never had a cardiovascular event such as MI, CVA, TIA, DVT. She does not take aspirin. She  is here for further evaluation.  INTERVAL HISTORY:  Patient was placed on an aspirin 325 mg daily. I wanted to do a surveillance CBC in 2 months to assess the trend of platelet count figure out whether it is physiological or pathological thrombocytosis. A CBC was done in the office today and clearly shows an increase in the platelet count pointing that we are dealing with a primary bone marrow myeloproliferative disorder likely essential thrombocythemia and less likely CML or JAK-2 to get if myeloproliferative disease. Today patient did undergo a jak 2 mutation, BCR/ABL RT PCR testing, iron studies, citrate and we're waiting for those results. I did discuss with the patient regarding initiation of Hydrea and she will start at a dose of 500 mg daily. I'm giving her a follow-up in 2 weeks to repeat a CBC and go over the lab results. She may need a bone marrow aspirate and biopsy if her Barnabas Lister 2 mutation is negative and BCR ABL testing is negative. She denies any shortness of breath, chest pain, headaches, numbness and tingling in her hands. She is taking aspirin on a daily basis.  MEDICAL HISTORY:  Past Medical History  Diagnosis Date  . Thrombocytosis September 2015  . Migraine     started in 50's  . Glaucoma     started in 40's  . Essential tremor     since 40's  . Seasonal allergies     to Dust, mold and pollen.  . Low back pain     since 40's  . Osteopenia     2014  . Endometriosis     required  TAH when young  . Tick bites     she likes out door activities, 2 this year (2015)  . Colon polyps     Benign. 2 colonoscopies in past. Due now again.    SURGICAL HISTORY: Past Surgical History  Procedure Laterality Date  . Total abdominal hysterectomy  1970's  . Tubal ligation    . Cataract extraction Left 02/07/2013    SOCIAL HISTORY: History   Social History  . Marital Status: Married    Spouse Name: N/A    Number of Children: N/A  . Years of Education: N/A   Occupational  History  . Not on file.   Social History Main Topics  . Smoking status: Former Smoker -- 0.25 packs/day    Types: Cigarettes    Quit date: 07/01/1961  . Smokeless tobacco: Not on file  . Alcohol Use: No  . Drug Use: No  . Sexual Activity: Yes   Other Topics Concern  . Not on file   Social History Narrative   Patient lives in [place]. Married. Came by herself. She has got 2 sons ages 57 and 41 and 2 grankids boy 84 and daughter 3. She is active, walks every day, semi-retired accountant.     FAMILY HISTORY: Family History  Problem Relation Age of Onset  . Thyroid disease Mother   . Cancer Father   . Cancer Maternal Aunt 80    ALLERGIES:  has No Known Allergies.  MEDICATIONS:  Current Outpatient Prescriptions  Medication Sig Dispense Refill  . aspirin 325 MG EC tablet Take 325 mg by mouth daily.    Marland Kitchen azelastine (ASTELIN) 0.1 % nasal spray Place 1 spray into both nostrils 2 (two) times daily. Use in each nostril as directed    . bacitracin ophthalmic ointment Place 1 application into both eyes 4 (four) times daily. Rub on eyelids    . bimatoprost (LUMIGAN) 0.03 % ophthalmic solution Place 1 drop into both eyes at bedtime.    . bismuth subsalicylate (PEPTO BISMOL) 262 MG/15ML suspension Take 30 mLs by mouth every 6 (six) hours as needed for diarrhea or loose stools.    . dorzolamide-timolol (COSOPT) 22.3-6.8 MG/ML ophthalmic solution Place 1 drop into both eyes 2 (two) times daily.    . SUMAtriptan (IMITREX) 100 MG tablet Take 100 mg by mouth every 2 (two) hours as needed for migraine or headache. May repeat in 2 hours if headache persists or recurs.    . hydroxyurea (HYDREA) 500 MG capsule Take 1 capsule (500 mg total) by mouth daily. May take with food to minimize GI side effects. 30 capsule 5   No current facility-administered medications for this visit.    REVIEW OF SYSTEMS:   Constitutional: Denies fevers, chills or abnormal night sweats, + Fatigue Eyes: Denies  blurriness of vision, double vision or watery eyes Ears, nose, mouth, throat, and face: Denies mucositis or sore throat Respiratory: Denies cough, dyspnea or wheezes, +seasonal allergies Cardiovascular: Denies palpitation, chest discomfort or lower extremity swelling Gastrointestinal:  Denies nausea, heartburn,+ change in bowel habits constipation alt with diarrhea Skin: Denies abnormal skin rashes Lymphatics: Denies new lymphadenopathy or easy bruising Neurological:Denies numbness, tingling or new weaknesses, low back pain Behavioral/Psych: Mood is stable, no new changes  All other systems were reviewed with the patient and are negative.  PHYSICAL EXAMINATION: ECOG PERFORMANCE STATUS: 0  Filed Vitals:   11/27/13 1414  BP: 130/65  Pulse: 67  Temp: 98 F (36.7 C)  Resp: 18   Filed Weights  11/27/13 1414  Weight: 135 lb 1.6 oz (61.281 kg)    GENERAL:alert, no distress and comfortable SKIN: skin color, texture, turgor are normal, no rashes or significant lesions EYES: normal, conjunctiva are pink and non-injected, sclera clear OROPHARYNX:no exudate, no erythema and lips, buccal mucosa, and tongue normal  NECK: supple, thyroid normal size, non-tender, without nodularity LYMPH:  no palpable lymphadenopathy in the cervical, axillary or inguinal LUNGS: clear to auscultation and percussion with normal breathing effort HEART: regular rate & rhythm and no murmurs and no lower extremity edema ABDOMEN:abdomen soft, non-tender and normal bowel sounds, no HSM Musculoskeletal:no cyanosis of digits and no clubbing  PSYCH: alert & oriented x 3 with fluent speech NEURO: no focal motor/sensory deficits  LABORATORY DATA:  I have reviewed the data as listed    RADIOGRAPHIC STUDIES:  08/17/2010 LUMBAR SPINE - COMPLETE 4+ VIEW Comparison: Left hip film of 08/30/2006 Findings: There is slight exaggeration of the lumbar lordosis. There is degenerative disc disease particularly at L2-3 and L3-4  levels with loss of disc space, sclerosis, and spurring. There is some degenerative change involving the facet joints particularly of L4-5 and L5-S1. The SI joints appear normal. The bowel gas pattern is nonspecific.  IMPRESSION: Degenerative disc disease primarily at L2-3 and L3-4. No acute abnormality.    ASSESSMENT & PLAN:   1. Chelsea Garcia is a pleasant 80 years old female in good health who is being referred here for an abnormal high platelet count. This is thrombocytosis and likely she has a myeloproliferative disorder. She also have mild anemia and mild leukocytosis.  2. On 2 occasions, she had an elevated platelet count, a phenomenon known as thrombocytosis.  3. Thrombocytosis can increase the risk for cardiovascular events like MI, TIA, CVA, DVT. That is why it is important to treated and control the platelet count and try to keep it in a normal range. I'm starting her today on Hydrea 500 mg daily. Side effects were discussed and she was given written information.  4. One of the common causes for thrombocytosis is iron deficiency. Patient's ferritin level was normal. The other differential includes the possibility of a myeloproliferative disorder such as essential thrombocytosis also known as ET. It is a clonal disorder and can persist for many years. It also increases the risk for myelofibrosis and acute leukemia.   5. An important differential is CML or chronic myeloid leukemia which can also manifest as an elevated platelet count. I will check a blood test called JAK2 mutation for ET and another test called BCR ABL for CML.   6. I will also order the iron studies and ferritin to be repeated.  7. I advised the patient to start taking an aspirin 325 mg daily for thromboprophylaxis.   8. I am repeating her CBC in 2 weeks. I will also check a comprehensive metabolic at the time of followup.  9. Sometimes the thrombocytosis is reactive and may correct itself without any intervention and  a two-month followup and establish whether it is our persistent problem or a temporary one. We can also consider the use of hydroxyurea which can effectively bring down the platelet count in the normal range.  10. Patient should continue getting age appropriate screening for colon cancer, breast cancer, cervical cancer.  11. She was also told to avoid going outdoor in the forest and woods because of her history of tick bites and wear proper clothing.  12. I will keep you posted about her progress as her clinical course  evolves.  Orders Placed This Encounter  Procedures  . CBC with Differential    Standing Status: Future     Number of Occurrences:      Standing Expiration Date: 11/28/2014  . Comprehensive metabolic panel (Cmet) - [facility]    Standing Status: Future     Number of Occurrences:      Standing Expiration Date: 11/28/2014    All questions were answered. The patient knows to call the clinic with any problems, questions or concerns. I spent 20 minutes counseling the patient face to face. The total time spent in the appointment was 25 minutes and more than 50% was on counseling.    Bernadene Bell, MD Medical Hematologist/Oncologist [place] Pager: [phone] Office No: [phone]

## 2013-12-02 DIAGNOSIS — R946 Abnormal results of thyroid function studies: Secondary | ICD-10-CM | POA: Diagnosis not present

## 2013-12-02 DIAGNOSIS — R5383 Other fatigue: Secondary | ICD-10-CM | POA: Diagnosis not present

## 2013-12-02 DIAGNOSIS — R7989 Other specified abnormal findings of blood chemistry: Secondary | ICD-10-CM | POA: Diagnosis not present

## 2013-12-11 DIAGNOSIS — H4011X3 Primary open-angle glaucoma, severe stage: Secondary | ICD-10-CM | POA: Diagnosis not present

## 2013-12-12 ENCOUNTER — Telehealth: Payer: Self-pay | Admitting: Hematology

## 2013-12-12 ENCOUNTER — Other Ambulatory Visit (HOSPITAL_BASED_OUTPATIENT_CLINIC_OR_DEPARTMENT_OTHER): Payer: Medicare Other

## 2013-12-12 ENCOUNTER — Encounter: Payer: Self-pay | Admitting: Hematology

## 2013-12-12 ENCOUNTER — Ambulatory Visit (HOSPITAL_BASED_OUTPATIENT_CLINIC_OR_DEPARTMENT_OTHER): Payer: Medicare Other | Admitting: Hematology

## 2013-12-12 VITALS — BP 107/50 | HR 80 | Temp 97.7°F | Resp 18 | Ht 62.0 in | Wt 135.4 lb

## 2013-12-12 DIAGNOSIS — D72829 Elevated white blood cell count, unspecified: Secondary | ICD-10-CM

## 2013-12-12 DIAGNOSIS — D473 Essential (hemorrhagic) thrombocythemia: Secondary | ICD-10-CM

## 2013-12-12 LAB — CBC WITH DIFFERENTIAL/PLATELET
BASO%: 0.7 % (ref 0.0–2.0)
Basophils Absolute: 0.1 10*3/uL (ref 0.0–0.1)
EOS%: 1.6 % (ref 0.0–7.0)
Eosinophils Absolute: 0.2 10*3/uL (ref 0.0–0.5)
HCT: 45.4 % (ref 34.8–46.6)
HGB: 14.6 g/dL (ref 11.6–15.9)
LYMPH#: 0.8 10*3/uL — AB (ref 0.9–3.3)
LYMPH%: 6.3 % — ABNORMAL LOW (ref 14.0–49.7)
MCH: 29.9 pg (ref 25.1–34.0)
MCHC: 32.1 g/dL (ref 31.5–36.0)
MCV: 93 fL (ref 79.5–101.0)
MONO#: 0.5 10*3/uL (ref 0.1–0.9)
MONO%: 3.9 % (ref 0.0–14.0)
NEUT#: 10.8 10*3/uL — ABNORMAL HIGH (ref 1.5–6.5)
NEUT%: 87.5 % — ABNORMAL HIGH (ref 38.4–76.8)
Platelets: 535 10*3/uL — ABNORMAL HIGH (ref 145–400)
RBC: 4.88 10*6/uL (ref 3.70–5.45)
RDW: 17.8 % — ABNORMAL HIGH (ref 11.2–14.5)
WBC: 12.4 10*3/uL — AB (ref 3.9–10.3)

## 2013-12-12 LAB — COMPREHENSIVE METABOLIC PANEL (CC13)
ALT: 24 U/L (ref 0–55)
ANION GAP: 7 meq/L (ref 3–11)
AST: 20 U/L (ref 5–34)
Albumin: 4.1 g/dL (ref 3.5–5.0)
Alkaline Phosphatase: 59 U/L (ref 40–150)
BILIRUBIN TOTAL: 0.64 mg/dL (ref 0.20–1.20)
BUN: 22.1 mg/dL (ref 7.0–26.0)
CHLORIDE: 107 meq/L (ref 98–109)
CO2: 27 meq/L (ref 22–29)
Calcium: 9.5 mg/dL (ref 8.4–10.4)
Creatinine: 0.8 mg/dL (ref 0.6–1.1)
EGFR: 67 mL/min/{1.73_m2} — ABNORMAL LOW (ref >90)
Glucose: 73 mg/dl (ref 70–140)
Potassium: 4 mEq/L (ref 3.5–5.1)
SODIUM: 141 meq/L (ref 136–145)
TOTAL PROTEIN: 6.5 g/dL (ref 6.4–8.3)

## 2013-12-12 NOTE — Telephone Encounter (Signed)
Gave avs & cal for Jan 2016. °

## 2013-12-12 NOTE — Progress Notes (Signed)
[facility] FOLLOW UP NOTE DATE OF VISIT: 11/27/2013  Patient Care Team: Carlos Levering, PA-C as PCP - General (Family Medicine) Aasim Marla Roe, MD as Consulting Physician (Hematology)  DIAGNOSIS: Essential Thrombocytosis, diagnosed in Sep 2015  CURRENT THERAPY: Hydroxyuria 500mg  daily started 11/27/2013, and ASA 325mg  daily   HISTORY OF PRESENTING ILLNESS:  Chelsea Garcia 80 y.o. female  from [place] who was being referred here for an elevated platelet count and seen on 10/01/2013. She recently had blood testing done by her primary care Carlos Levering PA. CBC collected on 09/12/2013 showed a white count of 10.1, hemoglobin 17.4 g, hematocrit 54.7, MCV 87.5, and platelet count of 515. A repeat CBC was done on 09/18/2013. It showed a white count 11.5, hemoglobin 16.5, hematocrit 51, MCV 86 and platelet count was 546. Patient does not have any iron deficiency. She does not have any stressors such as recent surgery, inflammation, infection which can lead to reactive thrombocytosis. Her metabolic panel showed normal glucose, normal kidney function, electrolytes and liver function panel. A serum ferritin was 112. B12 was 521 normal. TSH 7.65 mildly elevated consistent with hypothyroidism. Free T4 0.86 normal. Vitamin D level was 37.2. Tissue transglutaminase IgA was less than 2.   INTERVAL HISTORY: Jeana returns for follow-up. She started hydroxyurea from last visit about 3 weeks ago. She was previously seen by Dr. Lona Kettle, who has left the practice. She has been tolerating the hydroxyurea very well, no noticeable side effects. She denies any bleeding, fatigue, dizziness, or any others symptoms. No recent hospitalization or other medical events.   Patient Active Problem List   Diagnosis Date Noted  . Thrombocytosis 10/01/2013     MEDICAL HISTORY:  Past Medical History  Diagnosis Date  . Thrombocytosis September 2015  . Migraine     started in  50's  . Glaucoma     started in 40's  . Essential tremor     since 40's  . Seasonal allergies     to Dust, mold and pollen.  . Low back pain     since 40's  . Osteopenia     2014  . Endometriosis     required TAH when young  . Tick bites     she likes out door activities, 2 this year (2015)  . Colon polyps     Benign. 2 colonoscopies in past. Due now again.    SURGICAL HISTORY: Past Surgical History  Procedure Laterality Date  . Total abdominal hysterectomy  1970's  . Tubal ligation    . Cataract extraction Left 02/07/2013    SOCIAL HISTORY: History   Social History  . Marital Status: Married    Spouse Name: N/A    Number of Children: N/A  . Years of Education: N/A   Occupational History  . Not on file.   Social History Main Topics  . Smoking status: Former Smoker -- 0.25 packs/day    Types: Cigarettes    Quit date: 07/01/1961  . Smokeless tobacco: Not on file  . Alcohol Use: No  . Drug Use: No  . Sexual Activity: Yes   Other Topics Concern  . Not on file   Social History Narrative   Patient lives in [place]. Married. Came by herself. She has got 2 sons ages 13 and 60 and 2 grankids boy 107 and daughter 74. She is active, walks every day, semi-retired accountant.     FAMILY HISTORY: Family History  Problem Relation Age of Onset  .  Thyroid disease Mother   . Cancer Father   . Cancer Maternal Aunt 80    ALLERGIES:  has No Known Allergies.  MEDICATIONS:  Current Outpatient Prescriptions  Medication Sig Dispense Refill  . aspirin 325 MG EC tablet Take 325 mg by mouth daily.    Marland Kitchen azelastine (ASTELIN) 0.1 % nasal spray Place 1 spray into both nostrils 2 (two) times daily. Use in each nostril as directed    . bacitracin ophthalmic ointment Place 1 application into both eyes 4 (four) times daily. Rub on eyelids    . bimatoprost (LUMIGAN) 0.03 % ophthalmic solution Place 1 drop into both eyes at bedtime.    . dorzolamide-timolol (COSOPT) 22.3-6.8 MG/ML  ophthalmic solution Place 1 drop into both eyes 2 (two) times daily.    . hydroxyurea (HYDREA) 500 MG capsule Take 1 capsule (500 mg total) by mouth daily. May take with food to minimize GI side effects. 30 capsule 5  . Probiotic Product (PROBIOTIC DAILY PO) Take 1 tablet by mouth daily.    . SUMAtriptan (IMITREX) 100 MG tablet Take 100 mg by mouth every 2 (two) hours as needed for migraine or headache. May repeat in 2 hours if headache persists or recurs.     No current facility-administered medications for this visit.    REVIEW OF SYSTEMS:   Constitutional: Denies fevers, chills or abnormal night sweats, + Fatigue Eyes: Denies blurriness of vision, double vision or watery eyes Ears, nose, mouth, throat, and face: Denies mucositis or sore throat Respiratory: Denies cough, dyspnea or wheezes, +seasonal allergies Cardiovascular: Denies palpitation, chest discomfort or lower extremity swelling Gastrointestinal:  Denies nausea, heartburn,+ change in bowel habits constipation alt with diarrhea Skin: Denies abnormal skin rashes Lymphatics: Denies new lymphadenopathy or easy bruising Neurological:Denies numbness, tingling or new weaknesses, low back pain Behavioral/Psych: Mood is stable, no new changes  All other systems were reviewed with the patient and are negative.  PHYSICAL EXAMINATION: ECOG PERFORMANCE STATUS: 0  Filed Vitals:   12/12/13 0952  BP: 107/50  Pulse: 80  Temp: 97.7 F (36.5 C)  Resp: 18   Filed Weights   12/12/13 0952  Weight: 135 lb 6.4 oz (61.417 kg)    GENERAL:alert, no distress and comfortable SKIN: skin color, texture, turgor are normal, no rashes or significant lesions EYES: normal, conjunctiva are pink and non-injected, sclera clear OROPHARYNX:no exudate, no erythema and lips, buccal mucosa, and tongue normal  NECK: supple, thyroid normal size, non-tender, without nodularity LYMPH:  no palpable lymphadenopathy in the cervical, axillary or inguinal LUNGS:  clear to auscultation and percussion with normal breathing effort HEART: regular rate & rhythm and no murmurs and no lower extremity edema ABDOMEN:abdomen soft, non-tender and normal bowel sounds, no HSM Musculoskeletal:no cyanosis of digits and no clubbing  PSYCH: alert & oriented x 3 with fluent speech NEURO: no focal motor/sensory deficits  LABORATORY DATA:  I have reviewed the data as listed CBC with Differential  Status: Finalresult Visible to patient:  Not Released Nextappt: 01/16/2014 at 09:30 AM in Oncology [facility] 1) Dx:  Essential thrombocythemia              Ref Range 9:30 AM  2wk ago     WBC 3.9 - 10.3 10e3/uL 12.4 (H) 11.7 (H)    NEUT# 1.5 - 6.5 10e3/uL 10.8 (H) 10.1 (H)    HGB 11.6 - 15.9 g/dL 14.6 15.1    HCT 34.8 - 46.6 % 45.4 45.9    Platelets 145 - 400 10e3/uL  535 (H) 646 (H)    MCV 79.5 - 101.0 fL 93.0 91.6    MCH 25.1 - 34.0 pg 29.9 30.1    MCHC 31.5 - 36.0 g/dL 32.1 32.9    RBC 3.70 - 5.45 10e6/uL 4.88 5.01    RDW 11.2 - 14.5 % 17.8 (H) 17.8 (H)    lymph# 0.9 - 3.3 10e3/uL 0.8 (L) 0.9    MONO# 0.1 - 0.9 10e3/uL 0.5 0.5    Eosinophils Absolute 0.0 - 0.5 10e3/uL 0.2 0.2    Basophils Absolute 0.0 - 0.1 10e3/uL 0.1 0.0    NEUT% 38.4 - 76.8 % 87.5 (H) 86.4 (H)    LYMPH% 14.0 - 49.7 % 6.3 (L) 7.9 (L)    MONO% 0.0 - 14.0 % 3.9 3.9    EOS% 0.0 - 7.0 % 1.6 1.5    BASO% 0.0 - 2.0 % 0.7 0.3        Comprehensive metabolic panel (Cmet) - [facility]  Status: Finalresult Visible to patient:  Not Released Nextappt: 01/16/2014 at 09:30 AM in Oncology [facility] 1) Dx:  Essential thrombocythemia              Ref Range 9:30 AM  2wk ago  58yrago     Sodium 136 - 145 mEq/L 141 143 134 (L)R    Potassium 3.5 - 5.1 mEq/L 4.0 4.6 3.7    Chloride 98 - 109 mEq/L 107 106 98R    CO2 22 - 29 mEq/L 27 28 24R    Glucose 70 - 140 mg/dl 73 89 140 (H)R    BUN 7.0 - 26.0 mg/dL 22.1 23.0 20R     Creatinine 0.6 - 1.1 mg/dL 0.8 0.8 0.68R, CM    Total Bilirubin 0.20 - 1.20 mg/dL 0.64 0.49     Alkaline Phosphatase 40 - 150 U/L 59 62     AST 5 - 34 U/L 20 21     ALT 0 - 55 U/L 24 16     Total Protein 6.4 - 8.3 g/dL 6.5 6.8     Albumin 3.5 - 5.0 g/dL 4.1 4.3     Calcium 8.4 - 10.4 mg/dL 9.5 10.1 9.9R    Anion Gap 3 - 11 mEq/L 7 9     EGFR >90 ml/min/1.73 m2 67 (L)           RADIOGRAPHIC STUDIES: No new scans since last visit.   ASSESSMENT & PLAN:  MNikayla Madarisis a pleasant 80years old female in good health who presented with mild leukocytosis and thrombocytosis with dated count around 500-650K. JAK2 mutation was positive, BCR-ABL was negative. She has essential thrombocytosis. No history of thrombosis.  1. Essential thrombocytosis, JAK2(+) -She is tolerating hydroxyurea very well. Her today's count is 12.4, hemoglobin 14.6, platelets 535. We'll continue the current dose. If her platelet count is still above 500k on the visit, I'll probably increase her hydroxyurea to 500 twice daily. -Continue aspirin 325 mg daily. -We discussed the risk of thrombosis. She knows what to watch. -We discussed the goal of therapy is palliative, and prevent thrombosis. Unfortunately it is not curable, and there is a small risk in the involved to acute leukemia down the road.  Follow-up I'll see her back in 1 month's repeated CBC and differential.   Orders Placed This Encounter  Procedures  . CBC with Differential    Standing Status: Future     Number of Occurrences:      Standing Expiration Date: 12/13/2014  .  Comprehensive metabolic panel (Cmet) - [facility]    Standing Status: Future     Number of Occurrences:      Standing Expiration Date: 12/13/2014    All questions were answered. The patient knows to call the clinic with any problems, questions or concerns.  I spent 20 minutes counseling the patient face to face. The total time spent in the appointment was 25 minutes and more than  50% was on counseling.    Truitt Merle  12/12/2013

## 2013-12-20 DIAGNOSIS — H409 Unspecified glaucoma: Secondary | ICD-10-CM | POA: Diagnosis not present

## 2013-12-20 DIAGNOSIS — J301 Allergic rhinitis due to pollen: Secondary | ICD-10-CM | POA: Diagnosis not present

## 2013-12-20 DIAGNOSIS — R251 Tremor, unspecified: Secondary | ICD-10-CM | POA: Diagnosis not present

## 2013-12-20 DIAGNOSIS — R7989 Other specified abnormal findings of blood chemistry: Secondary | ICD-10-CM | POA: Diagnosis not present

## 2013-12-20 DIAGNOSIS — K589 Irritable bowel syndrome without diarrhea: Secondary | ICD-10-CM | POA: Diagnosis not present

## 2013-12-20 DIAGNOSIS — D473 Essential (hemorrhagic) thrombocythemia: Secondary | ICD-10-CM | POA: Diagnosis not present

## 2013-12-20 DIAGNOSIS — R5383 Other fatigue: Secondary | ICD-10-CM | POA: Diagnosis not present

## 2014-01-16 ENCOUNTER — Encounter: Payer: Self-pay | Admitting: Hematology

## 2014-01-16 ENCOUNTER — Telehealth: Payer: Self-pay | Admitting: Hematology

## 2014-01-16 ENCOUNTER — Ambulatory Visit (HOSPITAL_BASED_OUTPATIENT_CLINIC_OR_DEPARTMENT_OTHER): Payer: Medicare Other | Admitting: Hematology

## 2014-01-16 ENCOUNTER — Other Ambulatory Visit (HOSPITAL_BASED_OUTPATIENT_CLINIC_OR_DEPARTMENT_OTHER): Payer: Medicare Other

## 2014-01-16 VITALS — BP 122/57 | HR 76 | Temp 98.2°F | Resp 18 | Ht 62.0 in | Wt 136.3 lb

## 2014-01-16 DIAGNOSIS — D473 Essential (hemorrhagic) thrombocythemia: Secondary | ICD-10-CM | POA: Diagnosis not present

## 2014-01-16 LAB — COMPREHENSIVE METABOLIC PANEL (CC13)
ALT: 13 U/L (ref 0–55)
AST: 18 U/L (ref 5–34)
Albumin: 4.1 g/dL (ref 3.5–5.0)
Alkaline Phosphatase: 57 U/L (ref 40–150)
Anion Gap: 7 mEq/L (ref 3–11)
BILIRUBIN TOTAL: 0.62 mg/dL (ref 0.20–1.20)
BUN: 23.1 mg/dL (ref 7.0–26.0)
CALCIUM: 9.8 mg/dL (ref 8.4–10.4)
CHLORIDE: 108 meq/L (ref 98–109)
CO2: 29 mEq/L (ref 22–29)
CREATININE: 0.8 mg/dL (ref 0.6–1.1)
EGFR: 70 mL/min/{1.73_m2} — ABNORMAL LOW (ref >90)
Glucose: 76 mg/dl (ref 70–140)
Potassium: 4.4 mEq/L (ref 3.5–5.1)
Sodium: 144 mEq/L (ref 136–145)
Total Protein: 6.7 g/dL (ref 6.4–8.3)

## 2014-01-16 LAB — CBC WITH DIFFERENTIAL/PLATELET
BASO%: 0.5 % (ref 0.0–2.0)
Basophils Absolute: 0.1 10*3/uL (ref 0.0–0.1)
EOS%: 1.6 % (ref 0.0–7.0)
Eosinophils Absolute: 0.2 10*3/uL (ref 0.0–0.5)
HEMATOCRIT: 44.1 % (ref 34.8–46.6)
HGB: 14.3 g/dL (ref 11.6–15.9)
LYMPH#: 0.7 10*3/uL — AB (ref 0.9–3.3)
LYMPH%: 6.1 % — ABNORMAL LOW (ref 14.0–49.7)
MCH: 31.7 pg (ref 25.1–34.0)
MCHC: 32.6 g/dL (ref 31.5–36.0)
MCV: 97.5 fL (ref 79.5–101.0)
MONO#: 0.5 10*3/uL (ref 0.1–0.9)
MONO%: 4.5 % (ref 0.0–14.0)
NEUT#: 9.8 10*3/uL — ABNORMAL HIGH (ref 1.5–6.5)
NEUT%: 87.3 % — ABNORMAL HIGH (ref 38.4–76.8)
Platelets: 460 10*3/uL — ABNORMAL HIGH (ref 145–400)
RBC: 4.52 10*6/uL (ref 3.70–5.45)
RDW: 18.9 % — ABNORMAL HIGH (ref 11.2–14.5)
WBC: 11.2 10*3/uL — AB (ref 3.9–10.3)

## 2014-01-16 NOTE — Telephone Encounter (Signed)
Gave avs & cal for Feb. °

## 2014-01-16 NOTE — Progress Notes (Signed)
[facility] HEMATOLOGY FOLLOW UP NOTE DATE OF VISIT: 11/27/2013  Patient Care Team: Carlos Levering, PA-C as PCP - General (Family Medicine) Aasim Marla Roe, MD as Consulting Physician (Hematology)  DIAGNOSIS: Essential Thrombocytosis, diagnosed in Sep 2015  CURRENT THERAPY: Hydroxyuria 500mg  daily started 11/27/2013, and ASA 325mg  daily   HISTORY OF PRESENTING ILLNESS:  Chelsea Garcia 81 y.o. female  from [place] who was being referred here for an elevated platelet count and seen on 10/01/2013. She recently had blood testing done by her primary care Carlos Levering PA. CBC collected on 09/12/2013 showed a white count of 10.1, hemoglobin 17.4 g, hematocrit 54.7, MCV 87.5, and platelet count of 515. A repeat CBC was done on 09/18/2013. It showed a white count 11.5, hemoglobin 16.5, hematocrit 51, MCV 86 and platelet count was 546. Patient does not have any iron deficiency. She does not have any stressors such as recent surgery, inflammation, infection which can lead to reactive thrombocytosis. Her metabolic panel showed normal glucose, normal kidney function, electrolytes and liver function panel. A serum ferritin was 112. B12 was 521 normal. TSH 7.65 mildly elevated consistent with hypothyroidism. Free T4 0.86 normal. Vitamin D level was 37.2. Tissue transglutaminase IgA was less than 2.   INTERVAL HISTORY: Lailana returns for follow-up. She is toerating the hydrea very well, no nausea or other issues. Has goo denergy and appetite, no fever, chills or night sweats. No weight loss.   Patient Active Problem List   Diagnosis Date Noted  . Thrombocytosis 10/01/2013     MEDICAL HISTORY:  Past Medical History  Diagnosis Date  . Thrombocytosis September 2015  . Migraine     started in 50's  . Glaucoma     started in 40's  . Essential tremor     since 40's  . Seasonal allergies     to Dust, mold and pollen.  . Low back pain     since 40's  . Osteopenia      2014  . Endometriosis     required TAH when young  . Tick bites     she likes out door activities, 2 this year (2015)  . Colon polyps     Benign. 2 colonoscopies in past. Due now again.    SURGICAL HISTORY: Past Surgical History  Procedure Laterality Date  . Total abdominal hysterectomy  1970's  . Tubal ligation    . Cataract extraction Left 02/07/2013    SOCIAL HISTORY: History   Social History  . Marital Status: Married    Spouse Name: N/A    Number of Children: N/A  . Years of Education: N/A   Occupational History  . Not on file.   Social History Main Topics  . Smoking status: Former Smoker -- 0.25 packs/day    Types: Cigarettes    Quit date: 07/01/1961  . Smokeless tobacco: Not on file  . Alcohol Use: No  . Drug Use: No  . Sexual Activity: Yes   Other Topics Concern  . Not on file   Social History Narrative   Patient lives in [place]. Married. Came by herself. She has got 2 sons ages 53 and 33 and 2 grankids boy 58 and daughter 52. She is active, walks every day, semi-retired accountant.     FAMILY HISTORY: Family History  Problem Relation Age of Onset  . Thyroid disease Mother   . Cancer Father   . Cancer Maternal Aunt 80    ALLERGIES:  has No Known Allergies.  MEDICATIONS:  Current Outpatient Prescriptions  Medication Sig Dispense Refill  . aspirin 325 MG EC tablet Take 325 mg by mouth daily.    Marland Kitchen azelastine (ASTELIN) 0.1 % nasal spray Place 1 spray into both nostrils 2 (two) times daily. Use in each nostril as directed    . bacitracin ophthalmic ointment Place 1 application into both eyes 4 (four) times daily. Rub on eyelids    . bimatoprost (LUMIGAN) 0.03 % ophthalmic solution Place 1 drop into both eyes at bedtime.    . dorzolamide-timolol (COSOPT) 22.3-6.8 MG/ML ophthalmic solution Place 1 drop into both eyes 2 (two) times daily.    . hydroxyurea (HYDREA) 500 MG capsule Take 1 capsule (500 mg total) by mouth daily. May take with food to  minimize GI side effects. 30 capsule 5  . Probiotic Product (PROBIOTIC DAILY PO) Take 1 tablet by mouth daily.    . SUMAtriptan (IMITREX) 100 MG tablet Take 100 mg by mouth every 2 (two) hours as needed for migraine or headache. May repeat in 2 hours if headache persists or recurs.     No current facility-administered medications for this visit.    REVIEW OF SYSTEMS:   Constitutional: Denies fevers, chills or abnormal night sweats, + Fatigue Eyes: Denies blurriness of vision, double vision or watery eyes Ears, nose, mouth, throat, and face: Denies mucositis or sore throat Respiratory: Denies cough, dyspnea or wheezes, +seasonal allergies Cardiovascular: Denies palpitation, chest discomfort or lower extremity swelling Gastrointestinal:  Denies nausea, heartburn,+ change in bowel habits constipation alt with diarrhea Skin: Denies abnormal skin rashes Lymphatics: Denies new lymphadenopathy or easy bruising Neurological:Denies numbness, tingling or new weaknesses, low back pain Behavioral/Psych: Mood is stable, no new changes  All other systems were reviewed with the patient and are negative.  PHYSICAL EXAMINATION: ECOG PERFORMANCE STATUS: 0  Filed Vitals:   01/16/14 0952  BP: 122/57  Pulse: 76  Temp: 98.2 F (36.8 C)  Resp: 18   Filed Weights   01/16/14 0952  Weight: 136 lb 4.8 oz (61.825 kg)    GENERAL:alert, no distress and comfortable SKIN: skin color, texture, turgor are normal, no rashes or significant lesions EYES: normal, conjunctiva are pink and non-injected, sclera clear OROPHARYNX:no exudate, no erythema and lips, buccal mucosa, and tongue normal  NECK: supple, thyroid normal size, non-tender, without nodularity LYMPH:  no palpable lymphadenopathy in the cervical, axillary or inguinal LUNGS: clear to auscultation and percussion with normal breathing effort HEART: regular rate & rhythm and no murmurs and no lower extremity edema ABDOMEN:abdomen soft, non-tender and  normal bowel sounds, no HSM Musculoskeletal:no cyanosis of digits and no clubbing  PSYCH: alert & oriented x 3 with fluent speech NEURO: no focal motor/sensory deficits  LABORATORY DATA:  I have reviewed the data as listed  CBC Latest Ref Rng 01/16/2014 12/12/2013 11/27/2013  WBC 3.9 - 10.3 10e3/uL 11.2(H) 12.4(H) 11.7(H)  Hemoglobin 11.6 - 15.9 g/dL 14.3 14.6 15.1  Hematocrit 34.8 - 46.6 % 44.1 45.4 45.9  Platelets 145 - 400 10e3/uL 460(H) 535(H) 646(H)    CMP Latest Ref Rng 01/16/2014 12/12/2013 11/27/2013  Glucose 70 - 140 mg/dl 76 73 89  BUN 7.0 - 26.0 mg/dL 23.1 22.1 23.0  Creatinine 0.6 - 1.1 mg/dL 0.8 0.8 0.8  Sodium 136 - 145 mEq/L 144 141 143  Potassium 3.5 - 5.1 mEq/L 4.4 4.0 4.6  Chloride 96 - 112 mEq/L - - -  CO2 22 - 29 mEq/L 29 27 28   Calcium 8.4 - 10.4 mg/dL 9.8  9.5 10.1  Total Protein 6.4 - 8.3 g/dL 6.7 6.5 6.8  Total Bilirubin 0.20 - 1.20 mg/dL 0.62 0.64 0.49  Alkaline Phos 40 - 150 U/L 57 59 62  AST 5 - 34 U/L 18 20 21   ALT 0 - 55 U/L 13 24 16        RADIOGRAPHIC STUDIES: No new scans since last visit.   ASSESSMENT & PLAN:  Huntleigh Doolen is a pleasant 81 years old female in good health who presented with mild leukocytosis and thrombocytosis with dated count around 500-650K. JAK2 mutation was positive, BCR-ABL was negative. She has essential thrombocytosis. No history of thrombosis.  1. Essential thrombocytosis, JAK2(+) -She is tolerating hydroxyurea very well. Her today's platelet is 460K.  -We'll continue the current hydrea dose. -Continue aspirin 325 mg daily. -We discussed the risk of thrombosis. She knows what to watch. -We discussed the goal of therapy is palliative, and prevent thrombosis. Unfortunately it is not curable, and there is a small risk in the involved to acute leukemia down the road.  Follow-up I'll see her back in 1 month's repeated CBC and differential.   All questions were answered. The patient knows to call the clinic with any  problems, questions or concerns.  I spent 10 minutes counseling the patient face to face. The total time spent in the appointment was 15 minutes and more than 50% was on counseling.    Truitt Merle  01/16/2014

## 2014-01-20 DIAGNOSIS — R7989 Other specified abnormal findings of blood chemistry: Secondary | ICD-10-CM | POA: Diagnosis not present

## 2014-01-20 DIAGNOSIS — R946 Abnormal results of thyroid function studies: Secondary | ICD-10-CM | POA: Diagnosis not present

## 2014-02-11 ENCOUNTER — Other Ambulatory Visit: Payer: Self-pay | Admitting: *Deleted

## 2014-02-11 DIAGNOSIS — D473 Essential (hemorrhagic) thrombocythemia: Secondary | ICD-10-CM

## 2014-02-12 ENCOUNTER — Telehealth: Payer: Self-pay | Admitting: Hematology

## 2014-02-12 ENCOUNTER — Ambulatory Visit (HOSPITAL_BASED_OUTPATIENT_CLINIC_OR_DEPARTMENT_OTHER): Payer: Medicare Other | Admitting: Hematology

## 2014-02-12 ENCOUNTER — Encounter: Payer: Self-pay | Admitting: Hematology

## 2014-02-12 ENCOUNTER — Other Ambulatory Visit (HOSPITAL_BASED_OUTPATIENT_CLINIC_OR_DEPARTMENT_OTHER): Payer: Medicare Other

## 2014-02-12 VITALS — BP 110/55 | HR 65 | Temp 98.2°F | Resp 18 | Ht 62.0 in | Wt 138.4 lb

## 2014-02-12 DIAGNOSIS — D473 Essential (hemorrhagic) thrombocythemia: Secondary | ICD-10-CM | POA: Diagnosis not present

## 2014-02-12 LAB — CBC WITH DIFFERENTIAL/PLATELET
BASO%: 0.5 % (ref 0.0–2.0)
BASOS ABS: 0.1 10*3/uL (ref 0.0–0.1)
EOS%: 2.3 % (ref 0.0–7.0)
Eosinophils Absolute: 0.3 10*3/uL (ref 0.0–0.5)
HEMATOCRIT: 44.4 % (ref 34.8–46.6)
HGB: 14.3 g/dL (ref 11.6–15.9)
LYMPH#: 0.7 10*3/uL — AB (ref 0.9–3.3)
LYMPH%: 6.8 % — ABNORMAL LOW (ref 14.0–49.7)
MCH: 32 pg (ref 25.1–34.0)
MCHC: 32.2 g/dL (ref 31.5–36.0)
MCV: 99.3 fL (ref 79.5–101.0)
MONO#: 0.5 10*3/uL (ref 0.1–0.9)
MONO%: 4.6 % (ref 0.0–14.0)
NEUT%: 85.8 % — AB (ref 38.4–76.8)
NEUTROS ABS: 9.4 10*3/uL — AB (ref 1.5–6.5)
PLATELETS: 412 10*3/uL — AB (ref 145–400)
RBC: 4.47 10*6/uL (ref 3.70–5.45)
RDW: 17.5 % — ABNORMAL HIGH (ref 11.2–14.5)
WBC: 11 10*3/uL — AB (ref 3.9–10.3)

## 2014-02-12 NOTE — Telephone Encounter (Signed)
gv adn prnted appt sched and avs for pt for Feb thru May

## 2014-02-12 NOTE — Progress Notes (Signed)
[facility] FOLLOW UP NOTE DATE OF VISIT: 11/27/2013  Patient Care Team: Carlos Levering, PA-C as PCP - General (Family Medicine) Aasim Marla Roe, MD as Consulting Physician (Hematology)  DIAGNOSIS: Essential Thrombocytosis, diagnosed in Sep 2015  CURRENT THERAPY: Hydroxyuria 500mg  daily started 11/27/2013, and ASA 325mg  daily   HISTORY OF PRESENTING ILLNESS:  Chelsea Garcia 81 y.o. female  from [place] who was being referred here for an elevated platelet count and seen on 10/01/2013. She recently had blood testing done by her primary care Carlos Levering PA. CBC collected on 09/12/2013 showed a white count of 10.1, hemoglobin 17.4 g, hematocrit 54.7, MCV 87.5, and platelet count of 515. A repeat CBC was done on 09/18/2013. It showed a white count 11.5, hemoglobin 16.5, hematocrit 51, MCV 86 and platelet count was 546. Patient does not have any iron deficiency. She does not have any stressors such as recent surgery, inflammation, infection which can lead to reactive thrombocytosis. Her metabolic panel showed normal glucose, normal kidney function, electrolytes and liver function panel. A serum ferritin was 112. B12 was 521 normal. TSH 7.65 mildly elevated consistent with hypothyroidism. Free T4 0.86 normal. Vitamin D level was 37.2. Tissue transglutaminase IgA was less than 2.   INTERVAL HISTORY: Shenise returns for follow-up. She is toerating the hydrea very well, no nausea or other issues. Has goo denergy and appetite, no fever, chills or night sweats. No weight loss. No other new complains.   Patient Active Problem List   Diagnosis Date Noted  . Essential thrombocytosis 10/01/2013     MEDICAL HISTORY:  Past Medical History  Diagnosis Date  . Thrombocytosis September 2015  . Migraine     started in 50's  . Glaucoma     started in 40's  . Essential tremor     since 40's  . Seasonal allergies     to Dust, mold and pollen.  . Low back  pain     since 40's  . Osteopenia     2014  . Endometriosis     required TAH when young  . Tick bites     she likes out door activities, 2 this year (2015)  . Colon polyps     Benign. 2 colonoscopies in past. Due now again.    SURGICAL HISTORY: Past Surgical History  Procedure Laterality Date  . Total abdominal hysterectomy  1970's  . Tubal ligation    . Cataract extraction Left 02/07/2013    SOCIAL HISTORY: History   Social History  . Marital Status: Married    Spouse Name: N/A    Number of Children: N/A  . Years of Education: N/A   Occupational History  . Not on file.   Social History Main Topics  . Smoking status: Former Smoker -- 0.25 packs/day    Types: Cigarettes    Quit date: 07/01/1961  . Smokeless tobacco: Not on file  . Alcohol Use: No  . Drug Use: No  . Sexual Activity: Yes   Other Topics Concern  . Not on file   Social History Narrative   Patient lives in [place]. Married. Came by herself. She has got 2 sons ages 52 and 3 and 2 grankids boy 18 and daughter 66. She is active, walks every day, semi-retired accountant.     FAMILY HISTORY: Family History  Problem Relation Age of Onset  . Thyroid disease Mother   . Cancer Father   . Cancer Maternal Aunt 80    ALLERGIES:  is allergic to pollen extract.  MEDICATIONS:  Current Outpatient Prescriptions  Medication Sig Dispense Refill  . aspirin 325 MG EC tablet Take 325 mg by mouth daily.    Marland Kitchen azelastine (ASTELIN) 0.1 % nasal spray Place 1 spray into both nostrils 2 (two) times daily. Use in each nostril as directed    . bacitracin ophthalmic ointment Place 1 application into both eyes 4 (four) times daily. Rub on eyelids    . bimatoprost (LUMIGAN) 0.03 % ophthalmic solution Place 1 drop into both eyes at bedtime.    . dorzolamide-timolol (COSOPT) 22.3-6.8 MG/ML ophthalmic solution Place 1 drop into both eyes 2 (two) times daily.    . hydroxyurea (HYDREA) 500 MG capsule Take 1 capsule (500 mg  total) by mouth daily. May take with food to minimize GI side effects. 30 capsule 5  . Probiotic Product (PROBIOTIC DAILY PO) Take 1 tablet by mouth daily.    . SUMAtriptan (IMITREX) 100 MG tablet Take 100 mg by mouth every 2 (two) hours as needed for migraine or headache. May repeat in 2 hours if headache persists or recurs.     No current facility-administered medications for this visit.    REVIEW OF SYSTEMS:   Constitutional: Denies fevers, chills or abnormal night sweats, + Fatigue Eyes: Denies blurriness of vision, double vision or watery eyes Ears, nose, mouth, throat, and face: Denies mucositis or sore throat Respiratory: Denies cough, dyspnea or wheezes, +seasonal allergies Cardiovascular: Denies palpitation, chest discomfort or lower extremity swelling Gastrointestinal:  Denies nausea, heartburn,+ change in bowel habits constipation alt with diarrhea Skin: Denies abnormal skin rashes Lymphatics: Denies new lymphadenopathy or easy bruising Neurological:Denies numbness, tingling or new weaknesses, low back pain Behavioral/Psych: Mood is stable, no new changes  All other systems were reviewed with the patient and are negative.  PHYSICAL EXAMINATION: ECOG PERFORMANCE STATUS: 0  Filed Vitals:   02/12/14 0942  BP: 110/55  Pulse: 65  Temp: 98.2 F (36.8 C)  Resp: 18   Filed Weights   02/12/14 0942  Weight: 138 lb 6.4 oz (62.778 kg)    GENERAL:alert, no distress and comfortable SKIN: skin color, texture, turgor are normal, no rashes or significant lesions EYES: normal, conjunctiva are pink and non-injected, sclera clear OROPHARYNX:no exudate, no erythema and lips, buccal mucosa, and tongue normal  NECK: supple, thyroid normal size, non-tender, without nodularity LYMPH:  no palpable lymphadenopathy in the cervical, axillary or inguinal LUNGS: clear to auscultation and percussion with normal breathing effort HEART: regular rate & rhythm and no murmurs and no lower extremity  edema ABDOMEN:abdomen soft, non-tender and normal bowel sounds, no HSM Musculoskeletal:no cyanosis of digits and no clubbing  PSYCH: alert & oriented x 3 with fluent speech NEURO: no focal motor/sensory deficits  LABORATORY DATA:  I have reviewed the data as listed  CBC Latest Ref Rng 02/12/2014 01/16/2014 12/12/2013  WBC 3.9 - 10.3 10e3/uL 11.0(H) 11.2(H) 12.4(H)  Hemoglobin 11.6 - 15.9 g/dL 14.3 14.3 14.6  Hematocrit 34.8 - 46.6 % 44.4 44.1 45.4  Platelets 145 - 400 10e3/uL 412(H) 460(H) 535(H)    CMP Latest Ref Rng 01/16/2014 12/12/2013 11/27/2013  Glucose 70 - 140 mg/dl 76 73 89  BUN 7.0 - 26.0 mg/dL 23.1 22.1 23.0  Creatinine 0.6 - 1.1 mg/dL 0.8 0.8 0.8  Sodium 136 - 145 mEq/L 144 141 143  Potassium 3.5 - 5.1 mEq/L 4.4 4.0 4.6  Chloride 96 - 112 mEq/L - - -  CO2 22 - 29 mEq/L 29 27 28  Calcium 8.4 - 10.4 mg/dL 9.8 9.5 10.1  Total Protein 6.4 - 8.3 g/dL 6.7 6.5 6.8  Total Bilirubin 0.20 - 1.20 mg/dL 0.62 0.64 0.49  Alkaline Phos 40 - 150 U/L 57 59 62  AST 5 - 34 U/L 18 20 21   ALT 0 - 55 U/L 13 24 16        RADIOGRAPHIC STUDIES: No new scans since last visit.   ASSESSMENT & PLAN:  Haya Hemler is a pleasant 81 years old female in good health who presented with mild leukocytosis and thrombocytosis with dated count around 500-650K. JAK2 mutation was positive, BCR-ABL was negative. She has essential thrombocytosis. No history of thrombosis.  1. Essential thrombocytosis, JAK2(+) -She is tolerating hydroxyurea very well. Her today's platelet is 412K.  -We'll continue the current hydrea dose. -Continue aspirin 325 mg daily. -We discussed the risk of thrombosis. She knows what to watch. -We discussed the goal of therapy is palliative, and prevent thrombosis. Unfortunately it is not curable, and there is a small risk in the involved to acute leukemia down the road.  Follow-up Repeat CBC every months. I'll see her back in 3 months.    All questions were answered. The patient  knows to call the clinic with any problems, questions or concerns.  I spent 10 minutes counseling the patient face to face. The total time spent in the appointment was 15 minutes and more than 50% was on counseling.    Truitt Merle  02/12/2014

## 2014-03-03 DIAGNOSIS — Z86010 Personal history of colonic polyps: Secondary | ICD-10-CM | POA: Diagnosis not present

## 2014-03-03 DIAGNOSIS — K625 Hemorrhage of anus and rectum: Secondary | ICD-10-CM | POA: Diagnosis not present

## 2014-03-12 ENCOUNTER — Other Ambulatory Visit (HOSPITAL_BASED_OUTPATIENT_CLINIC_OR_DEPARTMENT_OTHER): Payer: Medicare Other

## 2014-03-12 DIAGNOSIS — D473 Essential (hemorrhagic) thrombocythemia: Secondary | ICD-10-CM

## 2014-03-12 LAB — CBC WITH DIFFERENTIAL/PLATELET
BASO%: 0.3 % (ref 0.0–2.0)
Basophils Absolute: 0 10*3/uL (ref 0.0–0.1)
EOS ABS: 0.2 10*3/uL (ref 0.0–0.5)
EOS%: 1.7 % (ref 0.0–7.0)
HCT: 47.1 % — ABNORMAL HIGH (ref 34.8–46.6)
HGB: 15.4 g/dL (ref 11.6–15.9)
LYMPH%: 7.7 % — ABNORMAL LOW (ref 14.0–49.7)
MCH: 33.2 pg (ref 25.1–34.0)
MCHC: 32.7 g/dL (ref 31.5–36.0)
MCV: 101.5 fL — AB (ref 79.5–101.0)
MONO#: 0.4 10*3/uL (ref 0.1–0.9)
MONO%: 4.7 % (ref 0.0–14.0)
NEUT%: 85.6 % — ABNORMAL HIGH (ref 38.4–76.8)
NEUTROS ABS: 8.1 10*3/uL — AB (ref 1.5–6.5)
Platelets: 398 10*3/uL (ref 145–400)
RBC: 4.64 10*6/uL (ref 3.70–5.45)
RDW: 14.7 % — AB (ref 11.2–14.5)
WBC: 9.4 10*3/uL (ref 3.9–10.3)
lymph#: 0.7 10*3/uL — ABNORMAL LOW (ref 0.9–3.3)

## 2014-03-13 ENCOUNTER — Telehealth: Payer: Self-pay | Admitting: Hematology

## 2014-03-13 NOTE — Telephone Encounter (Signed)
I called patient for her lab test results from yesterday. CBC Latest Ref Rng 03/12/2014 02/12/2014 01/16/2014  WBC 3.9 - 10.3 10e3/uL 9.4 11.0(H) 11.2(H)  Hemoglobin 11.6 - 15.9 g/dL 15.4 14.3 14.3  Hematocrit 34.8 - 46.6 % 47.1(H) 44.4 44.1  Platelets 145 - 400 10e3/uL 398 412(H) 460(H)   She will continue current Hydrea dose at 500 mg daily.  Truitt Merle  03/13/2014

## 2014-04-16 ENCOUNTER — Other Ambulatory Visit (HOSPITAL_BASED_OUTPATIENT_CLINIC_OR_DEPARTMENT_OTHER): Payer: Medicare Other

## 2014-04-16 DIAGNOSIS — D473 Essential (hemorrhagic) thrombocythemia: Secondary | ICD-10-CM | POA: Diagnosis present

## 2014-04-16 LAB — CBC WITH DIFFERENTIAL/PLATELET
BASO%: 0.6 % (ref 0.0–2.0)
Basophils Absolute: 0 10*3/uL (ref 0.0–0.1)
EOS%: 1.8 % (ref 0.0–7.0)
Eosinophils Absolute: 0.1 10*3/uL (ref 0.0–0.5)
HEMATOCRIT: 47.2 % — AB (ref 34.8–46.6)
HGB: 15.1 g/dL (ref 11.6–15.9)
LYMPH%: 9.7 % — AB (ref 14.0–49.7)
MCH: 31.5 pg (ref 25.1–34.0)
MCHC: 32 g/dL (ref 31.5–36.0)
MCV: 98.2 fL (ref 79.5–101.0)
MONO#: 0.4 10*3/uL (ref 0.1–0.9)
MONO%: 5.1 % (ref 0.0–14.0)
NEUT%: 82.8 % — AB (ref 38.4–76.8)
NEUTROS ABS: 6.3 10*3/uL (ref 1.5–6.5)
PLATELETS: 339 10*3/uL (ref 145–400)
RBC: 4.81 10*6/uL (ref 3.70–5.45)
RDW: 14.3 % (ref 11.2–14.5)
WBC: 7.6 10*3/uL (ref 3.9–10.3)
lymph#: 0.7 10*3/uL — ABNORMAL LOW (ref 0.9–3.3)

## 2014-04-25 DIAGNOSIS — L508 Other urticaria: Secondary | ICD-10-CM | POA: Diagnosis not present

## 2014-04-30 DIAGNOSIS — R7989 Other specified abnormal findings of blood chemistry: Secondary | ICD-10-CM | POA: Diagnosis not present

## 2014-04-30 DIAGNOSIS — R946 Abnormal results of thyroid function studies: Secondary | ICD-10-CM | POA: Diagnosis not present

## 2014-05-13 ENCOUNTER — Telehealth: Payer: Self-pay | Admitting: Hematology

## 2014-05-13 NOTE — Telephone Encounter (Signed)
per pof to sch pt appt-gave pt copy of sch °

## 2014-05-15 ENCOUNTER — Ambulatory Visit: Payer: Medicare Other | Admitting: Hematology

## 2014-05-15 ENCOUNTER — Other Ambulatory Visit: Payer: Medicare Other

## 2014-05-23 ENCOUNTER — Other Ambulatory Visit (HOSPITAL_BASED_OUTPATIENT_CLINIC_OR_DEPARTMENT_OTHER): Payer: Medicare Other

## 2014-05-23 ENCOUNTER — Ambulatory Visit (HOSPITAL_BASED_OUTPATIENT_CLINIC_OR_DEPARTMENT_OTHER): Payer: Medicare Other | Admitting: Hematology

## 2014-05-23 ENCOUNTER — Encounter: Payer: Self-pay | Admitting: Hematology

## 2014-05-23 ENCOUNTER — Telehealth: Payer: Self-pay | Admitting: Hematology

## 2014-05-23 VITALS — BP 127/68 | HR 65 | Temp 98.1°F | Resp 18 | Ht 62.0 in | Wt 140.5 lb

## 2014-05-23 DIAGNOSIS — D473 Essential (hemorrhagic) thrombocythemia: Secondary | ICD-10-CM | POA: Diagnosis not present

## 2014-05-23 LAB — CBC WITH DIFFERENTIAL/PLATELET
BASO%: 0.3 % (ref 0.0–2.0)
Basophils Absolute: 0 10*3/uL (ref 0.0–0.1)
EOS%: 2.8 % (ref 0.0–7.0)
Eosinophils Absolute: 0.2 10*3/uL (ref 0.0–0.5)
HCT: 44.5 % (ref 34.8–46.6)
HGB: 14.5 g/dL (ref 11.6–15.9)
LYMPH#: 0.9 10*3/uL (ref 0.9–3.3)
LYMPH%: 10.9 % — ABNORMAL LOW (ref 14.0–49.7)
MCH: 31.4 pg (ref 25.1–34.0)
MCHC: 32.6 g/dL (ref 31.5–36.0)
MCV: 96.4 fL (ref 79.5–101.0)
MONO#: 0.5 10*3/uL (ref 0.1–0.9)
MONO%: 6 % (ref 0.0–14.0)
NEUT%: 80 % — AB (ref 38.4–76.8)
NEUTROS ABS: 6.3 10*3/uL (ref 1.5–6.5)
PLATELETS: 350 10*3/uL (ref 145–400)
RBC: 4.62 10*6/uL (ref 3.70–5.45)
RDW: 14.9 % — AB (ref 11.2–14.5)
WBC: 7.9 10*3/uL (ref 3.9–10.3)

## 2014-05-23 MED ORDER — HYDROXYUREA 500 MG PO CAPS: 500.0000 mg | ORAL_CAPSULE | Freq: Every day | ORAL | Status: DC

## 2014-05-23 NOTE — Progress Notes (Signed)
[facility]AT[facility] FOLLOW UP NOTE DATE OF VISIT: 11/27/2013  Patient Care Team: Carlos Levering, PA-C as PCP - General (Family Medicine) Bernadene Bell, MD as Consulting Physician (Hematology)  DIAGNOSIS: Essential Thrombocytosis, diagnosed in Sep 2015  CURRENT THERAPY: Hydroxyuria 500mg  daily started 11/27/2013, and ASA 325mg  daily   HISTORY OF PRESENTING ILLNESS:  Chelsea Garcia 81 y.o. female  from [place] who was being referred here for an elevated platelet count and seen on 10/01/2013. She recently had blood testing done by her primary care Carlos Levering PA. CBC collected on 09/12/2013 showed a white count of 10.1, hemoglobin 17.4 g, hematocrit 54.7, MCV 87.5, and platelet count of 515. A repeat CBC was done on 09/18/2013. It showed a white count 11.5, hemoglobin 16.5, hematocrit 51, MCV 86 and platelet count was 546. Patient does not have any iron deficiency. She does not have any stressors such as recent surgery, inflammation, infection which can lead to reactive thrombocytosis. Her metabolic panel showed normal glucose, normal kidney function, electrolytes and liver function panel. A serum ferritin was 112. B12 was 521 normal. TSH 7.65 mildly elevated consistent with hypothyroidism. Free T4 0.86 normal. Vitamin D level was 37.2. Tissue transglutaminase IgA was less than 2.   INTERVAL HISTORY: Chelsea Garcia returns for follow-up. She is doing very well overall. She is compliant with Hydrea. She has been having some skin rash hives lately, which itches, most in her trunk and thighs. She denies any other new drugs, she wants to reduce her aspirin dose to see if we'll get better. She otherwise feels well no other complaints.  Patient Active Problem List   Diagnosis Date Noted  . Essential thrombocytosis 10/01/2013     MEDICAL HISTORY:  Past Medical History  Diagnosis Date  . Thrombocytosis September 2015  . Migraine     started in 50's  . Glaucoma    started in 40's  . Essential tremor     since 40's  . Seasonal allergies     to Dust, mold and pollen.  . Low back pain     since 40's  . Osteopenia     2014  . Endometriosis     required TAH when young  . Tick bites     she likes out door activities, 2 this year (2015)  . Colon polyps     Benign. 2 colonoscopies in past. Due now again.    SURGICAL HISTORY: Past Surgical History  Procedure Laterality Date  . Total abdominal hysterectomy  1970's  . Tubal ligation    . Cataract extraction Left 02/07/2013    SOCIAL HISTORY: History   Social History  . Marital Status: Married    Spouse Name: N/A  . Number of Children: N/A  . Years of Education: N/A   Occupational History  . Not on file.   Social History Main Topics  . Smoking status: Former Smoker -- 0.25 packs/day    Types: Cigarettes    Quit date: 07/01/1961  . Smokeless tobacco: Not on file  . Alcohol Use: No  . Drug Use: No  . Sexual Activity: Yes   Other Topics Concern  . Not on file   Social History Narrative   Patient lives in [place]. Married. Came by herself. She has got 2 sons ages 22 and 78 and 2 grankids boy 30 and daughter 37. She is active, walks every day, semi-retired accountant.     FAMILY HISTORY: Family History  Problem Relation Age of Onset  .  Thyroid disease Mother   . Cancer Father   . Cancer Maternal Aunt 16    ALLERGIES:  is allergic to pollen extract.  MEDICATIONS:  Current Outpatient Prescriptions  Medication Sig Dispense Refill  . aspirin 325 MG EC tablet Take 325 mg by mouth daily.    Marland Kitchen azelastine (ASTELIN) 0.1 % nasal spray Place 1 spray into both nostrils 2 (two) times daily. Use in each nostril as directed    . bacitracin ophthalmic ointment Place 1 application into both eyes 4 (four) times daily. Rub on eyelids    . bimatoprost (LUMIGAN) 0.03 % ophthalmic solution Place 1 drop into both eyes at bedtime.    . dorzolamide-timolol (COSOPT) 22.3-6.8 MG/ML ophthalmic  solution Place 1 drop into both eyes 2 (two) times daily.    . hydroxyurea (HYDREA) 500 MG capsule Take 1 capsule (500 mg total) by mouth daily. May take with food to minimize GI side effects. 30 capsule 5  . Probiotic Product (PROBIOTIC DAILY PO) Take 1 tablet by mouth daily.    . SUMAtriptan (IMITREX) 100 MG tablet Take 100 mg by mouth every 2 (two) hours as needed for migraine or headache. May repeat in 2 hours if headache persists or recurs.     No current facility-administered medications for this visit.    REVIEW OF SYSTEMS:   Constitutional: Denies fevers, chills or abnormal night sweats, + Fatigue Eyes: Denies blurriness of vision, double vision or watery eyes Ears, nose, mouth, throat, and face: Denies mucositis or sore throat Respiratory: Denies cough, dyspnea or wheezes, +seasonal allergies Cardiovascular: Denies palpitation, chest discomfort or lower extremity swelling Gastrointestinal:  Denies nausea, heartburn,+ change in bowel habits constipation alt with diarrhea Skin: Denies abnormal skin rashes Lymphatics: Denies new lymphadenopathy or easy bruising Neurological:Denies numbness, tingling or new weaknesses, low back pain Behavioral/Psych: Mood is stable, no new changes  All other systems were reviewed with the patient and are negative.  PHYSICAL EXAMINATION: ECOG PERFORMANCE STATUS: 0  Filed Vitals:   05/23/14 1452  BP: 127/68  Pulse: 65  Temp: 98.1 F (36.7 C)  Resp: 18   Filed Weights   05/23/14 1452  Weight: 140 lb 8 oz (63.73 kg)    GENERAL:alert, no distress and comfortable SKIN: skin color, texture, turgor are normal, no rashes or significant lesions, except a a few rash on the left hip area.  EYES: normal, conjunctiva are pink and non-injected, sclera clear OROPHARYNX:no exudate, no erythema and lips, buccal mucosa, and tongue normal  NECK: supple, thyroid normal size, non-tender, without nodularity LYMPH:  no palpable lymphadenopathy in the cervical,  axillary or inguinal LUNGS: clear to auscultation and percussion with normal breathing effort HEART: regular rate & rhythm and no murmurs and no lower extremity edema ABDOMEN:abdomen soft, non-tender and normal bowel sounds, no HSM Musculoskeletal:no cyanosis of digits and no clubbing  PSYCH: alert & oriented x 3 with fluent speech NEURO: no focal motor/sensory deficits  LABORATORY DATA:  I have reviewed the data as listed  CBC Latest Ref Rng 05/23/2014 04/16/2014 03/12/2014  WBC 3.9 - 10.3 10e3/uL 7.9 7.6 9.4  Hemoglobin 11.6 - 15.9 g/dL 14.5 15.1 15.4  Hematocrit 34.8 - 46.6 % 44.5 47.2(H) 47.1(H)  Platelets 145 - 400 10e3/uL 350 339 398    CMP Latest Ref Rng 01/16/2014 12/12/2013 11/27/2013  Glucose 70 - 140 mg/dl 76 73 89  BUN 7.0 - 26.0 mg/dL 23.1 22.1 23.0  Creatinine 0.6 - 1.1 mg/dL 0.8 0.8 0.8  Sodium 136 - 145 mEq/L  144 141 143  Potassium 3.5 - 5.1 mEq/L 4.4 4.0 4.6  Chloride 96 - 112 mEq/L - - -  CO2 22 - 29 mEq/L 29 27 28   Calcium 8.4 - 10.4 mg/dL 9.8 9.5 10.1  Total Protein 6.4 - 8.3 g/dL 6.7 6.5 6.8  Total Bilirubin 0.20 - 1.20 mg/dL 0.62 0.64 0.49  Alkaline Phos 40 - 150 U/L 57 59 62  AST 5 - 34 U/L 18 20 21   ALT 0 - 55 U/L 13 24 16        RADIOGRAPHIC STUDIES: No new scans since last visit.   ASSESSMENT & PLAN:  Chelsea Garcia is a pleasant 81 years old female in good health who presented with mild leukocytosis and thrombocytosis with dated count around 500-650K. JAK2 mutation was positive, BCR-ABL was negative. She has essential thrombocytosis. No history of thrombosis.  1. Essential thrombocytosis, JAK2(+) -She is tolerating hydroxyurea very well. Her today's platelet is 412K.  -We'll continue the current hydrea dose. -We'll change in her aspirin from 325 mg to 81 mg once daily, due to the skin rash  -We discussed the risk of thrombosis. She knows what to watch. -We discussed the goal of therapy is palliative, and prevent thrombosis. Unfortunately it is not  curable, and there is a small risk in the involved to acute leukemia  and myelofibrosis down the road.  Follow-up Repeat CBC every 2  months. I'll see her back in 4 months.    All questions were answered. The patient knows to call the clinic with any problems, questions or concerns.  I spent 10 minutes counseling the patient face to face. The total time spent in the appointment was 15 minutes and more than 50% was on counseling.    Truitt Merle  05/23/2014

## 2014-05-23 NOTE — Telephone Encounter (Signed)
Gave adn printed appt sched and avs fo rpt for July and Sept

## 2014-05-25 ENCOUNTER — Other Ambulatory Visit: Payer: Self-pay | Admitting: Hematology

## 2014-06-18 DIAGNOSIS — R946 Abnormal results of thyroid function studies: Secondary | ICD-10-CM | POA: Diagnosis not present

## 2014-07-11 DIAGNOSIS — H4011X3 Primary open-angle glaucoma, severe stage: Secondary | ICD-10-CM | POA: Diagnosis not present

## 2014-07-18 ENCOUNTER — Other Ambulatory Visit (HOSPITAL_BASED_OUTPATIENT_CLINIC_OR_DEPARTMENT_OTHER): Payer: Medicare Other

## 2014-07-18 DIAGNOSIS — D473 Essential (hemorrhagic) thrombocythemia: Secondary | ICD-10-CM

## 2014-07-18 LAB — CBC WITH DIFFERENTIAL/PLATELET
BASO%: 0.1 % (ref 0.0–2.0)
Basophils Absolute: 0 10*3/uL (ref 0.0–0.1)
EOS%: 1.6 % (ref 0.0–7.0)
Eosinophils Absolute: 0.1 10*3/uL (ref 0.0–0.5)
HCT: 43.6 % (ref 34.8–46.6)
HGB: 14.5 g/dL (ref 11.6–15.9)
LYMPH#: 0.9 10*3/uL (ref 0.9–3.3)
LYMPH%: 11.5 % — AB (ref 14.0–49.7)
MCH: 32.6 pg (ref 25.1–34.0)
MCHC: 33.3 g/dL (ref 31.5–36.0)
MCV: 98 fL (ref 79.5–101.0)
MONO#: 0.5 10*3/uL (ref 0.1–0.9)
MONO%: 6.4 % (ref 0.0–14.0)
NEUT#: 6.1 10*3/uL (ref 1.5–6.5)
NEUT%: 80.4 % — ABNORMAL HIGH (ref 38.4–76.8)
PLATELETS: 339 10*3/uL (ref 145–400)
RBC: 4.45 10*6/uL (ref 3.70–5.45)
RDW: 15.2 % — AB (ref 11.2–14.5)
WBC: 7.5 10*3/uL (ref 3.9–10.3)

## 2014-08-01 DIAGNOSIS — L821 Other seborrheic keratosis: Secondary | ICD-10-CM | POA: Diagnosis not present

## 2014-09-05 ENCOUNTER — Telehealth: Payer: Self-pay | Admitting: Hematology

## 2014-09-05 NOTE — Telephone Encounter (Signed)
per Dr Burr Medico to move appt-cld & spoke to pt and gave pt r/s time & date

## 2014-09-12 ENCOUNTER — Ambulatory Visit: Payer: Medicare Other | Admitting: Hematology

## 2014-09-12 ENCOUNTER — Other Ambulatory Visit: Payer: Medicare Other

## 2014-09-17 ENCOUNTER — Ambulatory Visit (HOSPITAL_BASED_OUTPATIENT_CLINIC_OR_DEPARTMENT_OTHER): Payer: Medicare Other | Admitting: Hematology

## 2014-09-17 ENCOUNTER — Other Ambulatory Visit (HOSPITAL_BASED_OUTPATIENT_CLINIC_OR_DEPARTMENT_OTHER): Payer: Medicare Other

## 2014-09-17 ENCOUNTER — Telehealth: Payer: Self-pay | Admitting: Hematology

## 2014-09-17 ENCOUNTER — Encounter: Payer: Self-pay | Admitting: Hematology

## 2014-09-17 VITALS — BP 120/58 | HR 69 | Temp 98.1°F | Resp 17 | Ht 62.0 in | Wt 138.9 lb

## 2014-09-17 DIAGNOSIS — D473 Essential (hemorrhagic) thrombocythemia: Secondary | ICD-10-CM

## 2014-09-17 LAB — CBC WITH DIFFERENTIAL/PLATELET
BASO%: 0.3 % (ref 0.0–2.0)
Basophils Absolute: 0 10*3/uL (ref 0.0–0.1)
EOS ABS: 0.1 10*3/uL (ref 0.0–0.5)
EOS%: 1.6 % (ref 0.0–7.0)
HEMATOCRIT: 45.8 % (ref 34.8–46.6)
HEMOGLOBIN: 15.1 g/dL (ref 11.6–15.9)
LYMPH#: 0.5 10*3/uL — AB (ref 0.9–3.3)
LYMPH%: 7.8 % — ABNORMAL LOW (ref 14.0–49.7)
MCH: 32.2 pg (ref 25.1–34.0)
MCHC: 33 g/dL (ref 31.5–36.0)
MCV: 97.5 fL (ref 79.5–101.0)
MONO#: 0.4 10*3/uL (ref 0.1–0.9)
MONO%: 5.3 % (ref 0.0–14.0)
NEUT%: 85 % — ABNORMAL HIGH (ref 38.4–76.8)
NEUTROS ABS: 5.9 10*3/uL (ref 1.5–6.5)
Platelets: 307 10*3/uL (ref 145–400)
RBC: 4.7 10*6/uL (ref 3.70–5.45)
RDW: 15.7 % — AB (ref 11.2–14.5)
WBC: 7 10*3/uL (ref 3.9–10.3)

## 2014-09-17 NOTE — Telephone Encounter (Signed)
Gave adn printed appt sched and avs for pt for DEC adn March 2017

## 2014-09-17 NOTE — Progress Notes (Signed)
[facility] HEMATOLOGY FOLLOW UP NOTE DATE OF VISIT: 09/17/2014   Patient Care Team: Carlos Levering, PA-C as PCP - General (Family Medicine) Bernadene Bell, MD as Consulting Physician (Hematology)  DIAGNOSIS: Essential Thrombocytosis, diagnosed in Sep 2015  CURRENT THERAPY: Hydroxyuria 500mg  daily started 11/27/2013, and ASA 325mg  daily   HISTORY OF PRESENTING ILLNESS:  Chelsea Garcia 81 y.o. female  from [place] who was being referred here for an elevated platelet count and seen on 10/01/2013. She recently had blood testing done by her primary care Carlos Levering PA. CBC collected on 09/12/2013 showed a white count of 10.1, hemoglobin 17.4 g, hematocrit 54.7, MCV 87.5, and platelet count of 515. A repeat CBC was done on 09/18/2013. It showed a white count 11.5, hemoglobin 16.5, hematocrit 51, MCV 86 and platelet count was 546. Patient does not have any iron deficiency. She does not have any stressors such as recent surgery, inflammation, infection which can lead to reactive thrombocytosis. Her metabolic panel showed normal glucose, normal kidney function, electrolytes and liver function panel. A serum ferritin was 112. B12 was 521 normal. TSH 7.65 mildly elevated consistent with hypothyroidism. Free T4 0.86 normal. Vitamin D level was 37.2. Tissue transglutaminase IgA was less than 2.   INTERVAL HISTORY: Chelsea Garcia returns for follow-up. She is doing very well overall. Her skin rash resolved after she decreased aspirin dose to 81 mg daily. She is very compliant with Hydrea, takes one tablet daily, and has no noticeable side effects. She feels well, denies any pain or other symptoms. She remains physically active at home.  Patient Active Problem List   Diagnosis Date Noted  . Essential thrombocytosis 10/01/2013     MEDICAL HISTORY:  Past Medical History  Diagnosis Date  . Thrombocytosis September 2015  . Migraine     started in 50's  . Glaucoma     started in  40's  . Essential tremor     since 40's  . Seasonal allergies     to Dust, mold and pollen.  . Low back pain     since 40's  . Osteopenia     2014  . Endometriosis     required TAH when young  . Tick bites     she likes out door activities, 2 this year (2015)  . Colon polyps     Benign. 2 colonoscopies in past. Due now again.    SURGICAL HISTORY: Past Surgical History  Procedure Laterality Date  . Total abdominal hysterectomy  1970's  . Tubal ligation    . Cataract extraction Left 02/07/2013    SOCIAL HISTORY: Social History   Social History  . Marital Status: Married    Spouse Name: N/A  . Number of Children: N/A  . Years of Education: N/A   Occupational History  . Not on file.   Social History Main Topics  . Smoking status: Former Smoker -- 0.25 packs/day    Types: Cigarettes    Quit date: 07/01/1961  . Smokeless tobacco: Not on file  . Alcohol Use: No  . Drug Use: No  . Sexual Activity: Yes   Other Topics Concern  . Not on file   Social History Narrative   Patient lives in [place]. Married. Came by herself. She has got 2 sons ages 55 and 79 and 2 grankids boy 19 and daughter 15. She is active, walks every day, semi-retired accountant.     FAMILY HISTORY: Family History  Problem Relation Age of Onset  .  Thyroid disease Mother   . Cancer Father   . Cancer Maternal Aunt 15    ALLERGIES:  is allergic to pollen extract.  MEDICATIONS:  Current Outpatient Prescriptions  Medication Sig Dispense Refill  . aspirin 81 MG tablet Take 81 mg by mouth daily.    Marland Kitchen azelastine (ASTELIN) 0.1 % nasal spray Place 1 spray into both nostrils 2 (two) times daily. Use in each nostril as directed    . bacitracin ophthalmic ointment Place 1 application into both eyes daily. Rub on eyelids    . bimatoprost (LUMIGAN) 0.03 % ophthalmic solution Place 1 drop into both eyes at bedtime.    . dorzolamide-timolol (COSOPT) 22.3-6.8 MG/ML ophthalmic solution Place 1 drop into  both eyes 2 (two) times daily.    . hydroxyurea (HYDREA) 500 MG capsule Take 1 capsule (500 mg total) by mouth daily. May take with food to minimize GI side effects. 30 capsule 5  . Probiotic Product (PROBIOTIC DAILY PO) Take 1 tablet by mouth daily.    . SUMAtriptan (IMITREX) 100 MG tablet Take 100 mg by mouth every 2 (two) hours as needed for migraine or headache. May repeat in 2 hours if headache persists or recurs.     No current facility-administered medications for this visit.    REVIEW OF SYSTEMS:   Constitutional: Denies fevers, chills or abnormal night sweats, + Fatigue Eyes: Denies blurriness of vision, double vision or watery eyes Ears, nose, mouth, throat, and face: Denies mucositis or sore throat Respiratory: Denies cough, dyspnea or wheezes, +seasonal allergies Cardiovascular: Denies palpitation, chest discomfort or lower extremity swelling Gastrointestinal:  Denies nausea, heartburn,+ change in bowel habits constipation alt with diarrhea Skin: Denies abnormal skin rashes Lymphatics: Denies new lymphadenopathy or easy bruising Neurological:Denies numbness, tingling or new weaknesses, low back pain Behavioral/Psych: Mood is stable, no new changes  All other systems were reviewed with the patient and are negative.  PHYSICAL EXAMINATION: ECOG PERFORMANCE STATUS: 0  Filed Vitals:   09/17/14 0911  BP: 120/58  Pulse: 69  Temp: 98.1 F (36.7 C)  Resp: 17   Filed Weights   09/17/14 0911  Weight: 138 lb 14.4 oz (63.005 kg)    GENERAL:alert, no distress and comfortable SKIN: skin color, texture, turgor are normal, no rashes or significant lesions, except a a few rash on the left hip area.  EYES: normal, conjunctiva are pink and non-injected, sclera clear OROPHARYNX:no exudate, no erythema and lips, buccal mucosa, and tongue normal  NECK: supple, thyroid normal size, non-tender, without nodularity LYMPH:  no palpable lymphadenopathy in the cervical, axillary or  inguinal LUNGS: clear to auscultation and percussion with normal breathing effort HEART: regular rate & rhythm and no murmurs and no lower extremity edema ABDOMEN:abdomen soft, non-tender and normal bowel sounds, no HSM Musculoskeletal:no cyanosis of digits and no clubbing  PSYCH: alert & oriented x 3 with fluent speech NEURO: no focal motor/sensory deficits  LABORATORY DATA:  I have reviewed the data as listed  CBC Latest Ref Rng 09/17/2014 07/18/2014 05/23/2014  WBC 3.9 - 10.3 10e3/uL 7.0 7.5 7.9  Hemoglobin 11.6 - 15.9 g/dL 15.1 14.5 14.5  Hematocrit 34.8 - 46.6 % 45.8 43.6 44.5  Platelets 145 - 400 10e3/uL 307 339 350    CMP Latest Ref Rng 01/16/2014 12/12/2013 11/27/2013  Glucose 70 - 140 mg/dl 76 73 89  BUN 7.0 - 26.0 mg/dL 23.1 22.1 23.0  Creatinine 0.6 - 1.1 mg/dL 0.8 0.8 0.8  Sodium 136 - 145 mEq/L 144 141 143  Potassium 3.5 - 5.1 mEq/L 4.4 4.0 4.6  Chloride 96 - 112 mEq/L - - -  CO2 22 - 29 mEq/L 29 27 28   Calcium 8.4 - 10.4 mg/dL 9.8 9.5 10.1  Total Protein 6.4 - 8.3 g/dL 6.7 6.5 6.8  Total Bilirubin 0.20 - 1.20 mg/dL 0.62 0.64 0.49  Alkaline Phos 40 - 150 U/L 57 59 62  AST 5 - 34 U/L 18 20 21   ALT 0 - 55 U/L 13 24 16        RADIOGRAPHIC STUDIES: No new scans since last visit.   ASSESSMENT & PLAN:  Chelsea Garcia is a pleasant 81 years old female in good health who presented with mild leukocytosis and thrombocytosis with dated count around 500-650K. JAK2 mutation was positive, BCR-ABL was negative. She has essential thrombocytosis. No history of thrombosis.  1. Essential thrombocytosis, JAK2(+) -She is tolerating hydroxyurea very well. Her today's platelet is 307K toay  -We'll continue the current hydrea dose. -continue aspirin 81 mg once daily -We discussed the risk of thrombosis. She knows what to watch. -We discussed the goal of therapy is palliative, and prevent thrombosis. Unfortunately it is not curable, and there is a small risk in the involved to acute leukemia   and myelofibrosis down the road.  Follow-up Repeat CBC every 3  months. I'll see her back in 6 months.    All questions were answered. The patient knows to call the clinic with any problems, questions or concerns.  I spent 10 minutes counseling the patient face to face. The total time spent in the appointment was 15 minutes and more than 50% was on counseling.    Truitt Merle  09/17/2014

## 2014-09-19 DIAGNOSIS — R946 Abnormal results of thyroid function studies: Secondary | ICD-10-CM | POA: Diagnosis not present

## 2014-10-02 DIAGNOSIS — R161 Splenomegaly, not elsewhere classified: Secondary | ICD-10-CM | POA: Diagnosis not present

## 2014-10-02 DIAGNOSIS — D473 Essential (hemorrhagic) thrombocythemia: Secondary | ICD-10-CM | POA: Diagnosis not present

## 2014-10-02 DIAGNOSIS — R1032 Left lower quadrant pain: Secondary | ICD-10-CM | POA: Diagnosis not present

## 2014-10-02 DIAGNOSIS — R195 Other fecal abnormalities: Secondary | ICD-10-CM | POA: Diagnosis not present

## 2014-10-02 DIAGNOSIS — K5732 Diverticulitis of large intestine without perforation or abscess without bleeding: Secondary | ICD-10-CM | POA: Diagnosis not present

## 2014-10-02 DIAGNOSIS — K921 Melena: Secondary | ICD-10-CM | POA: Diagnosis not present

## 2014-10-09 DIAGNOSIS — Z23 Encounter for immunization: Secondary | ICD-10-CM | POA: Diagnosis not present

## 2014-10-09 DIAGNOSIS — K5732 Diverticulitis of large intestine without perforation or abscess without bleeding: Secondary | ICD-10-CM | POA: Diagnosis not present

## 2014-10-13 DIAGNOSIS — Z86010 Personal history of colonic polyps: Secondary | ICD-10-CM | POA: Diagnosis not present

## 2014-10-13 DIAGNOSIS — R197 Diarrhea, unspecified: Secondary | ICD-10-CM | POA: Diagnosis not present

## 2014-10-13 DIAGNOSIS — K5792 Diverticulitis of intestine, part unspecified, without perforation or abscess without bleeding: Secondary | ICD-10-CM | POA: Diagnosis not present

## 2014-10-16 DIAGNOSIS — R197 Diarrhea, unspecified: Secondary | ICD-10-CM | POA: Diagnosis not present

## 2014-10-17 DIAGNOSIS — R197 Diarrhea, unspecified: Secondary | ICD-10-CM | POA: Diagnosis not present

## 2014-10-24 ENCOUNTER — Encounter: Payer: Self-pay | Admitting: Podiatry

## 2014-10-24 ENCOUNTER — Ambulatory Visit (INDEPENDENT_AMBULATORY_CARE_PROVIDER_SITE_OTHER): Payer: Medicare Other | Admitting: Podiatry

## 2014-10-24 VITALS — BP 116/68 | HR 67 | Resp 12

## 2014-10-24 DIAGNOSIS — M79673 Pain in unspecified foot: Secondary | ICD-10-CM | POA: Diagnosis not present

## 2014-10-24 DIAGNOSIS — B351 Tinea unguium: Secondary | ICD-10-CM

## 2014-10-24 DIAGNOSIS — M79609 Pain in unspecified limb: Principal | ICD-10-CM

## 2014-10-24 NOTE — Progress Notes (Signed)
   Subjective:    Patient ID: Chelsea Garcia, female    DOB: [birth year], 81 y.o.   MRN: [id number]  HPI  Patient presents here today for B/L toenail trim,right ball of foot pain. This patient presents to the office with long thick painful nails both feet.  Her right big toenail is especially thick and painful due to trauma.  She has pain walking and wearing her shoes.  No history of diabetes.  Review of Systems  All other systems reviewed and are negative.      Objective:   Physical Exam GENERAL APPEARANCE: Alert, conversant. Appropriately groomed. No acute distress.  VASCULAR: Pedal pulses palpable at  [facility] and PT bilateral.  Capillary refill time is immediate to all digits,  Normal temperature gradient.  Digital hair growth is present bilateral  NEUROLOGIC: sensation is normal to 5.07 monofilament at 5/5 sites bilateral.  Light touch is intact bilateral, Muscle strength normal.  MUSCULOSKELETAL: acceptable muscle strength, tone and stability bilateral.  Intrinsic muscluature intact bilateral.  Rectus appearance of foot and digits noted bilateral.   DERMATOLOGIC: skin color, texture, and turgor are within normal limits.  No preulcerative lesions or ulcers  are seen, no interdigital maceration noted.  No open lesions present.  . No drainage noted.  NAILS  Thick disfigured discolored nails both feet.         Assessment & Plan:  Onychomycosis  B/L  Debridement and grinding of long painful nails.

## 2014-11-11 DIAGNOSIS — H401133 Primary open-angle glaucoma, bilateral, severe stage: Secondary | ICD-10-CM | POA: Diagnosis not present

## 2014-11-11 DIAGNOSIS — H01002 Unspecified blepharitis right lower eyelid: Secondary | ICD-10-CM | POA: Diagnosis not present

## 2014-11-11 DIAGNOSIS — H01004 Unspecified blepharitis left upper eyelid: Secondary | ICD-10-CM | POA: Diagnosis not present

## 2014-11-11 DIAGNOSIS — H01001 Unspecified blepharitis right upper eyelid: Secondary | ICD-10-CM | POA: Diagnosis not present

## 2014-11-28 ENCOUNTER — Other Ambulatory Visit: Payer: Self-pay | Admitting: Hematology

## 2014-12-16 DIAGNOSIS — J301 Allergic rhinitis due to pollen: Secondary | ICD-10-CM | POA: Diagnosis not present

## 2014-12-16 DIAGNOSIS — R7989 Other specified abnormal findings of blood chemistry: Secondary | ICD-10-CM | POA: Diagnosis not present

## 2014-12-16 DIAGNOSIS — R946 Abnormal results of thyroid function studies: Secondary | ICD-10-CM | POA: Diagnosis not present

## 2014-12-16 DIAGNOSIS — R5383 Other fatigue: Secondary | ICD-10-CM | POA: Diagnosis not present

## 2014-12-16 DIAGNOSIS — D473 Essential (hemorrhagic) thrombocythemia: Secondary | ICD-10-CM | POA: Diagnosis not present

## 2014-12-16 DIAGNOSIS — G43109 Migraine with aura, not intractable, without status migrainosus: Secondary | ICD-10-CM | POA: Diagnosis not present

## 2014-12-17 ENCOUNTER — Other Ambulatory Visit (HOSPITAL_BASED_OUTPATIENT_CLINIC_OR_DEPARTMENT_OTHER): Payer: Medicare Other

## 2014-12-17 DIAGNOSIS — D473 Essential (hemorrhagic) thrombocythemia: Secondary | ICD-10-CM

## 2014-12-17 LAB — COMPREHENSIVE METABOLIC PANEL
ALT: 10 U/L (ref 0–55)
AST: 14 U/L (ref 5–34)
Albumin: 4.2 g/dL (ref 3.5–5.0)
Alkaline Phosphatase: 69 U/L (ref 40–150)
Anion Gap: 8 mEq/L (ref 3–11)
BUN: 23.1 mg/dL (ref 7.0–26.0)
CHLORIDE: 109 meq/L (ref 98–109)
CO2: 26 meq/L (ref 22–29)
CREATININE: 0.8 mg/dL (ref 0.6–1.1)
Calcium: 9.7 mg/dL (ref 8.4–10.4)
EGFR: 66 mL/min/{1.73_m2} — ABNORMAL LOW (ref >90)
Glucose: 81 mg/dl (ref 70–140)
Potassium: 4 mEq/L (ref 3.5–5.1)
Sodium: 143 mEq/L (ref 136–145)
Total Bilirubin: 0.71 mg/dL (ref 0.20–1.20)
Total Protein: 7.2 g/dL (ref 6.4–8.3)

## 2014-12-17 LAB — CBC WITH DIFFERENTIAL/PLATELET
BASO%: 0.3 % (ref 0.0–2.0)
Basophils Absolute: 0 10*3/uL (ref 0.0–0.1)
EOS%: 2.1 % (ref 0.0–7.0)
Eosinophils Absolute: 0.2 10*3/uL (ref 0.0–0.5)
HCT: 46.3 % (ref 34.8–46.6)
HGB: 15.2 g/dL (ref 11.6–15.9)
LYMPH#: 0.6 10*3/uL — AB (ref 0.9–3.3)
LYMPH%: 8.2 % — AB (ref 14.0–49.7)
MCH: 31.8 pg (ref 25.1–34.0)
MCHC: 32.9 g/dL (ref 31.5–36.0)
MCV: 96.6 fL (ref 79.5–101.0)
MONO#: 0.4 10*3/uL (ref 0.1–0.9)
MONO%: 5.3 % (ref 0.0–14.0)
NEUT#: 6.5 10*3/uL (ref 1.5–6.5)
NEUT%: 84.1 % — AB (ref 38.4–76.8)
Platelets: 318 10*3/uL (ref 145–400)
RBC: 4.8 10*6/uL (ref 3.70–5.45)
RDW: 16.4 % — ABNORMAL HIGH (ref 11.2–14.5)
WBC: 7.7 10*3/uL (ref 3.9–10.3)

## 2015-01-22 ENCOUNTER — Ambulatory Visit: Payer: Medicare Other | Admitting: Podiatry

## 2015-01-23 ENCOUNTER — Ambulatory Visit: Payer: Medicare Other | Admitting: Podiatry

## 2015-01-28 ENCOUNTER — Encounter: Payer: Self-pay | Admitting: Podiatry

## 2015-01-28 ENCOUNTER — Ambulatory Visit (INDEPENDENT_AMBULATORY_CARE_PROVIDER_SITE_OTHER): Payer: Medicare Other | Admitting: Podiatry

## 2015-01-28 DIAGNOSIS — M79673 Pain in unspecified foot: Secondary | ICD-10-CM | POA: Diagnosis not present

## 2015-01-28 DIAGNOSIS — B351 Tinea unguium: Secondary | ICD-10-CM | POA: Diagnosis not present

## 2015-01-28 DIAGNOSIS — M79609 Pain in unspecified limb: Principal | ICD-10-CM

## 2015-01-28 NOTE — Progress Notes (Signed)
   Subjective:    Patient ID: Chelsea Garcia, female    DOB: [birth year], 82 y.o.   MRN: [id number]  HPI  Patient presents here today for B/L toenail trim,right ball of foot pain. This patient presents to the office with long thick painful nails both feet.  Her right big toenail is especially thick and painful due to trauma.  She has pain walking and wearing her shoes.  No history of diabetes.  Review of Systems  All other systems reviewed and are negative.      Objective:   Physical Exam GENERAL APPEARANCE: Alert, conversant. Appropriately groomed. No acute distress.  VASCULAR: Pedal pulses palpable at  [facility] and PT bilateral.  Capillary refill time is immediate to all digits,  Normal temperature gradient.  Digital hair growth is present bilateral  NEUROLOGIC: sensation is normal to 5.07 monofilament at 5/5 sites bilateral.  Light touch is intact bilateral, Muscle strength normal.  MUSCULOSKELETAL: acceptable muscle strength, tone and stability bilateral.  Intrinsic muscluature intact bilateral.  Rectus appearance of foot and digits noted bilateral.   DERMATOLOGIC: skin color, texture, and turgor are within normal limits.  No preulcerative lesions or ulcers  are seen, no interdigital maceration noted.  No open lesions present.  . No drainage noted.  NAILS  Thick disfigured discolored nails both feet.         Assessment & Plan:  Onychomycosis  B/L  Debridement and grinding of long painful nails. RTC 3 months   Gardiner Barefoot DPM

## 2015-02-24 DIAGNOSIS — R197 Diarrhea, unspecified: Secondary | ICD-10-CM | POA: Diagnosis not present

## 2015-03-05 DIAGNOSIS — K640 First degree hemorrhoids: Secondary | ICD-10-CM | POA: Diagnosis not present

## 2015-03-05 DIAGNOSIS — D123 Benign neoplasm of transverse colon: Secondary | ICD-10-CM | POA: Diagnosis not present

## 2015-03-05 DIAGNOSIS — K621 Rectal polyp: Secondary | ICD-10-CM | POA: Diagnosis not present

## 2015-03-05 DIAGNOSIS — Z86010 Personal history of colonic polyps: Secondary | ICD-10-CM | POA: Diagnosis not present

## 2015-03-05 DIAGNOSIS — D120 Benign neoplasm of cecum: Secondary | ICD-10-CM | POA: Diagnosis not present

## 2015-03-05 DIAGNOSIS — K5520 Angiodysplasia of colon without hemorrhage: Secondary | ICD-10-CM | POA: Diagnosis not present

## 2015-03-05 DIAGNOSIS — D128 Benign neoplasm of rectum: Secondary | ICD-10-CM | POA: Diagnosis not present

## 2015-03-05 DIAGNOSIS — D126 Benign neoplasm of colon, unspecified: Secondary | ICD-10-CM | POA: Diagnosis not present

## 2015-03-05 DIAGNOSIS — K5730 Diverticulosis of large intestine without perforation or abscess without bleeding: Secondary | ICD-10-CM | POA: Diagnosis not present

## 2015-03-09 ENCOUNTER — Telehealth: Payer: Self-pay | Admitting: Hematology

## 2015-03-09 DIAGNOSIS — R197 Diarrhea, unspecified: Secondary | ICD-10-CM | POA: Diagnosis not present

## 2015-03-09 NOTE — Telephone Encounter (Signed)
Lvm advising appt 3/3 moved from 3/1 due to [facility].

## 2015-03-11 ENCOUNTER — Ambulatory Visit: Payer: Medicare Other | Admitting: Hematology

## 2015-03-11 ENCOUNTER — Telehealth: Payer: Self-pay | Admitting: Hematology

## 2015-03-11 ENCOUNTER — Other Ambulatory Visit: Payer: Medicare Other

## 2015-03-11 NOTE — Telephone Encounter (Signed)
Left message on pt's voicemail in regards to having to reschedule patient due to provider situation. Rescheduled appt for 3/23 with 8:45am labs and 9:15am w/ Dr. Burr Medico. Left callback number [phone].

## 2015-03-13 ENCOUNTER — Other Ambulatory Visit: Payer: Medicare Other

## 2015-03-13 ENCOUNTER — Ambulatory Visit: Payer: Medicare Other | Admitting: Hematology

## 2015-03-17 DIAGNOSIS — Z961 Presence of intraocular lens: Secondary | ICD-10-CM | POA: Diagnosis not present

## 2015-03-17 DIAGNOSIS — Z0100 Encounter for examination of eyes and vision without abnormal findings: Secondary | ICD-10-CM | POA: Diagnosis not present

## 2015-03-17 DIAGNOSIS — H401133 Primary open-angle glaucoma, bilateral, severe stage: Secondary | ICD-10-CM | POA: Diagnosis not present

## 2015-04-02 ENCOUNTER — Ambulatory Visit (HOSPITAL_BASED_OUTPATIENT_CLINIC_OR_DEPARTMENT_OTHER): Payer: Medicare Other | Admitting: Hematology

## 2015-04-02 ENCOUNTER — Other Ambulatory Visit (HOSPITAL_BASED_OUTPATIENT_CLINIC_OR_DEPARTMENT_OTHER): Payer: Medicare Other

## 2015-04-02 ENCOUNTER — Encounter: Payer: Self-pay | Admitting: Hematology

## 2015-04-02 ENCOUNTER — Telehealth: Payer: Self-pay | Admitting: Hematology

## 2015-04-02 VITALS — BP 114/66 | HR 69 | Temp 97.7°F | Resp 18 | Ht 62.0 in | Wt 134.6 lb

## 2015-04-02 DIAGNOSIS — D473 Essential (hemorrhagic) thrombocythemia: Secondary | ICD-10-CM

## 2015-04-02 LAB — CBC WITH DIFFERENTIAL/PLATELET
BASO%: 0.1 % (ref 0.0–2.0)
Basophils Absolute: 0 10*3/uL (ref 0.0–0.1)
EOS ABS: 0.1 10*3/uL (ref 0.0–0.5)
EOS%: 0.9 % (ref 0.0–7.0)
HCT: 46.4 % (ref 34.8–46.6)
HEMOGLOBIN: 15 g/dL (ref 11.6–15.9)
LYMPH%: 8.8 % — ABNORMAL LOW (ref 14.0–49.7)
MCH: 31.9 pg (ref 25.1–34.0)
MCHC: 32.3 g/dL (ref 31.5–36.0)
MCV: 98.7 fL (ref 79.5–101.0)
MONO#: 0.4 10*3/uL (ref 0.1–0.9)
MONO%: 5.1 % (ref 0.0–14.0)
NEUT%: 85.1 % — ABNORMAL HIGH (ref 38.4–76.8)
NEUTROS ABS: 6.8 10*3/uL — AB (ref 1.5–6.5)
Platelets: 342 10*3/uL (ref 145–400)
RBC: 4.7 10*6/uL (ref 3.70–5.45)
RDW: 15.1 % — AB (ref 11.2–14.5)
WBC: 8 10*3/uL (ref 3.9–10.3)
lymph#: 0.7 10*3/uL — ABNORMAL LOW (ref 0.9–3.3)

## 2015-04-02 NOTE — Progress Notes (Signed)
[place] HEMATOLOGY FOLLOW UP NOTE DATE OF VISIT: 04/02/2015   Patient Care Team: Carlos Levering, PA-C as PCP - General (Family Medicine) Bernadene Bell, MD as Consulting Physician (Hematology)  DIAGNOSIS: Essential Thrombocytosis, diagnosed in Sep 2015  CURRENT THERAPY: Hydroxyuria 500mg  daily started 11/27/2013, and ASA 325mg  daily   HISTORY OF PRESENTING ILLNESS:  Chelsea Garcia 82 y.o. female  from [place] who was being referred here for an elevated platelet count and seen on 10/01/2013. She recently had blood testing done by her primary care Carlos Levering PA. CBC collected on 09/12/2013 showed a white count of 10.1, hemoglobin 17.4 g, hematocrit 54.7, MCV 87.5, and platelet count of 515. A repeat CBC was done on 09/18/2013. It showed a white count 11.5, hemoglobin 16.5, hematocrit 51, MCV 86 and platelet count was 546. Patient does not have any iron deficiency. She does not have any stressors such as recent surgery, inflammation, infection which can lead to reactive thrombocytosis. Her metabolic panel showed normal glucose, normal kidney function, electrolytes and liver function panel. A serum ferritin was 112. B12 was 521 normal. TSH 7.65 mildly elevated consistent with hypothyroidism. Free T4 0.86 normal. Vitamin D level was 37.2. Tissue transglutaminase IgA was less than 2.   INTERVAL HISTORY: Wynnette returns for follow-up. She is doing well, denies any new medical event since her last visit 6 months ago, no pain, dyspnea, leg swollen or other symptoms. She is very compliant with Hydrea, tolerates very well, no noticeable side effects.  Patient Active Problem List   Diagnosis Date Noted  . Essential thrombocytosis ([place]) 10/01/2013     MEDICAL HISTORY:  Past Medical History  Diagnosis Date  . Thrombocytosis [facility]) September 2015  . Migraine     started in 50's  . Glaucoma     started in 40's  . Essential tremor     since 40's  . Seasonal  allergies     to Dust, mold and pollen.  . Low back pain     since 40's  . Osteopenia     2014  . Endometriosis     required TAH when young  . Tick bites     she likes out door activities, 2 this year (2015)  . Colon polyps     Benign. 2 colonoscopies in past. Due now again.    SURGICAL HISTORY: Past Surgical History  Procedure Laterality Date  . Total abdominal hysterectomy  1970's  . Tubal ligation    . Cataract extraction Left 02/07/2013    SOCIAL HISTORY: Social History   Social History  . Marital Status: Married    Spouse Name: N/A  . Number of Children: N/A  . Years of Education: N/A   Occupational History  . Not on file.   Social History Main Topics  . Smoking status: Former Smoker -- 0.25 packs/day    Types: Cigarettes    Quit date: 07/01/1961  . Smokeless tobacco: Not on file  . Alcohol Use: No  . Drug Use: No  . Sexual Activity: Yes   Other Topics Concern  . Not on file   Social History Narrative   Patient lives in [place]. Married. Came by herself. She has got 2 sons ages 73 and 22 and 2 grankids boy 86 and daughter 9. She is active, walks every day, semi-retired accountant.     FAMILY HISTORY: Family History  Problem Relation Age of Onset  . Thyroid disease Mother   . Cancer Father   .  Cancer Maternal Aunt 29    ALLERGIES:  is allergic to pollen extract.  MEDICATIONS:  Current Outpatient Prescriptions  Medication Sig Dispense Refill  . aspirin 81 MG tablet Take 81 mg by mouth daily.    Marland Kitchen azelastine (ASTELIN) 0.1 % nasal spray Place 1 spray into both nostrils 2 (two) times daily. Use in each nostril as directed    . bacitracin ophthalmic ointment Place 1 application into both eyes daily. Rub on eyelids    . bimatoprost (LUMIGAN) 0.03 % ophthalmic solution Place 1 drop into both eyes at bedtime.    . dorzolamide-timolol (COSOPT) 22.3-6.8 MG/ML ophthalmic solution Place 1 drop into both eyes 2 (two) times daily.    . hydroxyurea (HYDREA)  500 MG capsule TAKE 1 CAPSULE BY MOUTH DAILY WITH FOOD TO MINIMIZE GI SIDE EFFECTS 30 capsule 5  . Probiotic Product (PROBIOTIC DAILY PO) Take 1 tablet by mouth daily.    . SUMAtriptan (IMITREX) 100 MG tablet Take 100 mg by mouth every 2 (two) hours as needed for migraine or headache. May repeat in 2 hours if headache persists or recurs.     No current facility-administered medications for this visit.    REVIEW OF SYSTEMS:   Constitutional: Denies fevers, chills or abnormal night sweats, + Fatigue Eyes: Denies blurriness of vision, double vision or watery eyes Ears, nose, mouth, throat, and face: Denies mucositis or sore throat Respiratory: Denies cough, dyspnea or wheezes, +seasonal allergies Cardiovascular: Denies palpitation, chest discomfort or lower extremity swelling Gastrointestinal:  Denies nausea, heartburn,+ change in bowel habits constipation alt with diarrhea Skin: Denies abnormal skin rashes Lymphatics: Denies new lymphadenopathy or easy bruising Neurological:Denies numbness, tingling or new weaknesses, low back pain Behavioral/Psych: Mood is stable, no new changes  All other systems were reviewed with the patient and are negative.  PHYSICAL EXAMINATION: ECOG PERFORMANCE STATUS: 0  Filed Vitals:   04/02/15 0930  BP: 114/66  Pulse: 69  Temp: 97.7 F (36.5 C)  Resp: 18   Filed Weights   04/02/15 0930  Weight: 134 lb 9.6 oz (61.054 kg)    GENERAL:alert, no distress and comfortable SKIN: skin color, texture, turgor are normal, no rashes or significant lesions, except a a few rash on the left hip area.  EYES: normal, conjunctiva are pink and non-injected, sclera clear OROPHARYNX:no exudate, no erythema and lips, buccal mucosa, and tongue normal  NECK: supple, thyroid normal size, non-tender, without nodularity LYMPH:  no palpable lymphadenopathy in the cervical, axillary or inguinal LUNGS: clear to auscultation and percussion with normal breathing effort HEART:  regular rate & rhythm and no murmurs and no lower extremity edema ABDOMEN:abdomen soft, non-tender and normal bowel sounds, no HSM Musculoskeletal:no cyanosis of digits and no clubbing  PSYCH: alert & oriented x 3 with fluent speech NEURO: no focal motor/sensory deficits  LABORATORY DATA:  I have reviewed the data as listed  CBC Latest Ref Rng 04/02/2015 12/17/2014 09/17/2014  WBC 3.9 - 10.3 10e3/uL 8.0 7.7 7.0  Hemoglobin 11.6 - 15.9 g/dL 15.0 15.2 15.1  Hematocrit 34.8 - 46.6 % 46.4 46.3 45.8  Platelets 145 - 400 10e3/uL 342 318 307    CMP Latest Ref Rng 12/17/2014 01/16/2014 12/12/2013  Glucose 70 - 140 mg/dl 81 76 73  BUN 7.0 - 26.0 mg/dL 23.1 23.1 22.1  Creatinine 0.6 - 1.1 mg/dL 0.8 0.8 0.8  Sodium 136 - 145 mEq/L 143 144 141  Potassium 3.5 - 5.1 mEq/L 4.0 4.4 4.0  CO2 22 - 29 mEq/L 26 29  27  Calcium 8.4 - 10.4 mg/dL 9.7 9.8 9.5  Total Protein 6.4 - 8.3 g/dL 7.2 6.7 6.5  Total Bilirubin 0.20 - 1.20 mg/dL 0.71 0.62 0.64  Alkaline Phos 40 - 150 U/L 69 57 59  AST 5 - 34 U/L 14 18 20   ALT 0 - 55 U/L 10 13 24        RADIOGRAPHIC STUDIES: No new scans since last visit.   ASSESSMENT & PLAN:  Kiandra Joel is a pleasant 82 years old female in good health who presented with mild leukocytosis and thrombocytosis with plt count around 500-650K. JAK2 mutation was positive, BCR-ABL was negative. She has essential thrombocytosis. No history of thrombosis.  1. Essential thrombocytosis, JAK2(+) -She is tolerating hydroxyurea very well. Her plt has been normal and well controlled  -I reviewed her CBC results from today, and give him a copy of the report -We'll continue the current hydrea dose, she is tolerating very well, CBC normal. -continue aspirin 81 mg once daily -We discussed the risk of thrombosis. She knows what to watch. -We discussed the goal of therapy is palliative, and prevent thrombosis. Unfortunately it is not curable, and there is a small risk in the involved to acute leukemia   and myelofibrosis down the road.  Follow-up Repeat CBC every 3  months. I'll see her back in 6 months.    All questions were answered. The patient knows to call the clinic with any problems, questions or concerns.  I spent 10 minutes counseling the patient face to face. The total time spent in the appointment was 15 minutes and more than 50% was on counseling.    Truitt Merle  04/02/2015

## 2015-04-02 NOTE — Telephone Encounter (Signed)
Gave and printed appt sced and avs for pt for June and Sept

## 2015-04-22 ENCOUNTER — Ambulatory Visit (INDEPENDENT_AMBULATORY_CARE_PROVIDER_SITE_OTHER): Payer: Medicare Other | Admitting: Podiatry

## 2015-04-22 ENCOUNTER — Encounter: Payer: Self-pay | Admitting: Podiatry

## 2015-04-22 DIAGNOSIS — M79609 Pain in unspecified limb: Principal | ICD-10-CM

## 2015-04-22 DIAGNOSIS — B351 Tinea unguium: Secondary | ICD-10-CM | POA: Diagnosis not present

## 2015-04-22 NOTE — Progress Notes (Signed)
   Subjective:    Patient ID: Chelsea Garcia, female    DOB: May 08, 1933, 82 y.o.   MRN: [id number]  HPI  Patient presents here today for B/L toenail trim,right ball of foot pain. This patient presents to the office with long thick painful nails both feet.  Her right big toenail is especially thick and painful due to trauma.  She has pain walking and wearing her shoes.  No history of diabetes.  Review of Systems  All other systems reviewed and are negative.      Objective:   Physical Exam GENERAL APPEARANCE: Alert, conversant. Appropriately groomed. No acute distress.  VASCULAR: Pedal pulses palpable at  [facility] and PT bilateral.  Capillary refill time is immediate to all digits,  Normal temperature gradient.  Digital hair growth is present bilateral  NEUROLOGIC: sensation is normal to 5.07 monofilament at 5/5 sites bilateral.  Light touch is intact bilateral, Muscle strength normal.  MUSCULOSKELETAL: acceptable muscle strength, tone and stability bilateral.  Intrinsic muscluature intact bilateral.  Rectus appearance of foot and digits noted bilateral.   DERMATOLOGIC: skin color, texture, and turgor are within normal limits.  No preulcerative lesions or ulcers  are seen, no interdigital maceration noted.  No open lesions present.  . No drainage noted.  NAILS  Thick disfigured discolored nails both feet.         Assessment & Plan:  Onychomycosis  B/L  Debridement and grinding of long painful nails. RTC 3 months   Gardiner Barefoot DPM

## 2015-05-12 DIAGNOSIS — S71151A Open bite, right thigh, initial encounter: Secondary | ICD-10-CM | POA: Diagnosis not present

## 2015-05-12 DIAGNOSIS — W57XXXA Bitten or stung by nonvenomous insect and other nonvenomous arthropods, initial encounter: Secondary | ICD-10-CM | POA: Diagnosis not present

## 2015-06-07 ENCOUNTER — Other Ambulatory Visit: Payer: Self-pay | Admitting: Hematology

## 2015-06-25 ENCOUNTER — Other Ambulatory Visit (HOSPITAL_BASED_OUTPATIENT_CLINIC_OR_DEPARTMENT_OTHER): Payer: Medicare Other

## 2015-06-25 DIAGNOSIS — D473 Essential (hemorrhagic) thrombocythemia: Secondary | ICD-10-CM | POA: Diagnosis present

## 2015-06-25 LAB — CBC WITH DIFFERENTIAL/PLATELET
BASO%: 0.2 % (ref 0.0–2.0)
Basophils Absolute: 0 10*3/uL (ref 0.0–0.1)
EOS%: 0.9 % (ref 0.0–7.0)
Eosinophils Absolute: 0.1 10*3/uL (ref 0.0–0.5)
HEMATOCRIT: 45.3 % (ref 34.8–46.6)
HGB: 14.7 g/dL (ref 11.6–15.9)
LYMPH%: 9.9 % — AB (ref 14.0–49.7)
MCH: 31.5 pg (ref 25.1–34.0)
MCHC: 32.5 g/dL (ref 31.5–36.0)
MCV: 97.2 fL (ref 79.5–101.0)
MONO#: 0.3 10*3/uL (ref 0.1–0.9)
MONO%: 5.1 % (ref 0.0–14.0)
NEUT%: 83.9 % — ABNORMAL HIGH (ref 38.4–76.8)
NEUTROS ABS: 5.4 10*3/uL (ref 1.5–6.5)
Platelets: 306 10*3/uL (ref 145–400)
RBC: 4.66 10*6/uL (ref 3.70–5.45)
RDW: 15.5 % — ABNORMAL HIGH (ref 11.2–14.5)
WBC: 6.5 10*3/uL (ref 3.9–10.3)
lymph#: 0.6 10*3/uL — ABNORMAL LOW (ref 0.9–3.3)
nRBC: 0 % (ref 0–0)

## 2015-06-25 LAB — COMPREHENSIVE METABOLIC PANEL
ALK PHOS: 52 U/L (ref 40–150)
ALT: 12 U/L (ref 0–55)
AST: 15 U/L (ref 5–34)
Albumin: 4.2 g/dL (ref 3.5–5.0)
Anion Gap: 8 mEq/L (ref 3–11)
BILIRUBIN TOTAL: 0.77 mg/dL (ref 0.20–1.20)
BUN: 24.8 mg/dL (ref 7.0–26.0)
CHLORIDE: 107 meq/L (ref 98–109)
CO2: 29 meq/L (ref 22–29)
Calcium: 9.9 mg/dL (ref 8.4–10.4)
Creatinine: 0.9 mg/dL (ref 0.6–1.1)
EGFR: 60 mL/min/{1.73_m2} — ABNORMAL LOW (ref >90)
GLUCOSE: 73 mg/dL (ref 70–140)
POTASSIUM: 4.3 meq/L (ref 3.5–5.1)
SODIUM: 143 meq/L (ref 136–145)
Total Protein: 7.2 g/dL (ref 6.4–8.3)

## 2015-07-15 ENCOUNTER — Ambulatory Visit (INDEPENDENT_AMBULATORY_CARE_PROVIDER_SITE_OTHER): Payer: Medicare Other | Admitting: Podiatry

## 2015-07-15 DIAGNOSIS — M79676 Pain in unspecified toe(s): Secondary | ICD-10-CM | POA: Diagnosis not present

## 2015-07-15 DIAGNOSIS — B351 Tinea unguium: Secondary | ICD-10-CM

## 2015-07-15 DIAGNOSIS — M79609 Pain in unspecified limb: Principal | ICD-10-CM

## 2015-07-15 NOTE — Progress Notes (Signed)
   Subjective:    Patient ID: Chelsea Garcia, female    DOB: [birth year], 82 y.o.   MRN: [id number]  HPI  Patient presents here today for B/L toenail trim,right ball of foot pain. This patient presents to the office with long thick painful nails both feet.  Her right big toenail is especially thick and painful due to trauma.  She has pain walking and wearing her shoes.  No history of diabetes.  Review of Systems  All other systems reviewed and are negative.      Objective:   Physical Exam GENERAL APPEARANCE: Alert, conversant. Appropriately groomed. No acute distress.  VASCULAR: Pedal pulses palpable at  [facility] and PT bilateral.  Capillary refill time is immediate to all digits,  Normal temperature gradient.  Digital hair growth is present bilateral  NEUROLOGIC: sensation is normal to 5.07 monofilament at 5/5 sites bilateral.  Light touch is intact bilateral, Muscle strength normal.  MUSCULOSKELETAL: acceptable muscle strength, tone and stability bilateral.  Intrinsic muscluature intact bilateral.  Rectus appearance of foot and digits noted bilateral.   DERMATOLOGIC: skin color, texture, and turgor are within normal limits.  No preulcerative lesions or ulcers  are seen, no interdigital maceration noted.  No open lesions present.  . No drainage noted.  NAILS  Thick disfigured discolored nails both feet.         Assessment & Plan:  Onychomycosis  B/L  Debridement and grinding of long painful nails. RTC 3 months   Gardiner Barefoot DPM

## 2015-07-17 DIAGNOSIS — H401113 Primary open-angle glaucoma, right eye, severe stage: Secondary | ICD-10-CM | POA: Diagnosis not present

## 2015-07-17 DIAGNOSIS — H401123 Primary open-angle glaucoma, left eye, severe stage: Secondary | ICD-10-CM | POA: Diagnosis not present

## 2015-07-23 ENCOUNTER — Ambulatory Visit: Payer: Medicare Other | Admitting: Podiatry

## 2015-09-17 ENCOUNTER — Other Ambulatory Visit (HOSPITAL_BASED_OUTPATIENT_CLINIC_OR_DEPARTMENT_OTHER): Payer: Medicare Other

## 2015-09-17 ENCOUNTER — Telehealth: Payer: Self-pay | Admitting: Hematology

## 2015-09-17 ENCOUNTER — Ambulatory Visit (HOSPITAL_BASED_OUTPATIENT_CLINIC_OR_DEPARTMENT_OTHER): Payer: Medicare Other | Admitting: Hematology

## 2015-09-17 ENCOUNTER — Encounter: Payer: Self-pay | Admitting: Hematology

## 2015-09-17 ENCOUNTER — Other Ambulatory Visit: Payer: Medicare Other

## 2015-09-17 ENCOUNTER — Ambulatory Visit: Payer: Medicare Other | Admitting: Hematology

## 2015-09-17 VITALS — BP 118/64 | HR 70 | Temp 98.0°F | Resp 16 | Ht 62.0 in | Wt 137.9 lb

## 2015-09-17 DIAGNOSIS — D473 Essential (hemorrhagic) thrombocythemia: Secondary | ICD-10-CM | POA: Diagnosis not present

## 2015-09-17 LAB — CBC WITH DIFFERENTIAL/PLATELET
BASO%: 0.1 % (ref 0.0–2.0)
BASOS ABS: 0 10*3/uL (ref 0.0–0.1)
EOS%: 1.5 % (ref 0.0–7.0)
Eosinophils Absolute: 0.1 10*3/uL (ref 0.0–0.5)
HEMATOCRIT: 42.2 % (ref 34.8–46.6)
HGB: 14 g/dL (ref 11.6–15.9)
LYMPH#: 0.8 10*3/uL — AB (ref 0.9–3.3)
LYMPH%: 10.8 % — AB (ref 14.0–49.7)
MCH: 32.5 pg (ref 25.1–34.0)
MCHC: 33.2 g/dL (ref 31.5–36.0)
MCV: 97.9 fL (ref 79.5–101.0)
MONO#: 0.4 10*3/uL (ref 0.1–0.9)
MONO%: 5.5 % (ref 0.0–14.0)
NEUT#: 6 10*3/uL (ref 1.5–6.5)
NEUT%: 82.1 % — AB (ref 38.4–76.8)
Platelets: 293 10*3/uL (ref 145–400)
RBC: 4.31 10*6/uL (ref 3.70–5.45)
RDW: 15.7 % — ABNORMAL HIGH (ref 11.2–14.5)
WBC: 7.3 10*3/uL (ref 3.9–10.3)

## 2015-09-17 NOTE — Progress Notes (Signed)
[facility]EMAT[facility]Y FOLLOW UP NOTE DATE OF VISIT: 09/17/2015   Patient Care Team: Carlos Levering, PA-C as PCP - General (Family Medicine) Bernadene Bell, MD as Consulting Physician (Hematology)  DIAGNOSIS: Essential Thrombocytosis, diagnosed in Sep 2015  CURRENT THERAPY: Hydroxyuria 500mg  daily started 11/27/2013, and ASA 81mg  daily   HISTORY OF PRESENTING ILLNESS:  Chelsea Garcia 82 y.o. female  from [place] who was being referred here for an elevated platelet count and seen on 10/01/2013. Chelsea Garcia recently had blood testing done by her primary care Carlos Levering PA. CBC collected on 09/12/2013 showed a white count of 10.1, hemoglobin 17.4 g, hematocrit 54.7, MCV 87.5, and platelet count of 515. A repeat CBC was done on 09/18/2013. It showed a white count 11.5, hemoglobin 16.5, hematocrit 51, MCV 86 and platelet count was 546. Patient does not have any iron deficiency. Chelsea Garcia does not have any stressors such as recent surgery, inflammation, infection which can lead to reactive thrombocytosis. Her metabolic panel showed normal glucose, normal kidney function, electrolytes and liver function panel. A serum ferritin was 112. B12 was 521 normal. TSH 7.65 mildly elevated consistent with hypothyroidism. Free T4 0.86 normal. Vitamin D level was 37.2. Tissue transglutaminase IgA was less than 2.   INTERVAL HISTORY: Chelsea Garcia for follow-up. Chelsea Garcia is doing very well overall. Chelsea Garcia has occasional diarrhea, Chelsea Garcia takes probiotics which helps. Chelsea Garcia denies any significant pain, leg swelling, dyspnea, or other symptoms. Chelsea Garcia has good appetite and energy level. Chelsea Garcia is very compliant with Hydrea and tolerates very well.  Patient Active Problem List   Diagnosis Date Noted  . Essential thrombocytosis ([place]) 10/01/2013     MEDICAL HISTORY:  Past Medical History:  Diagnosis Date  . Colon polyps    Benign. 2 colonoscopies in past. Due now again.  . Endometriosis    required TAH when young   . Essential tremor    since 40's  . Glaucoma    started in 40's  . Low back pain    since 40's  . Migraine    started in 50's  . Osteopenia    2014  . Seasonal allergies    to Dust, mold and pollen.  . Thrombocytosis ([place]) September 2015  . Tick bites    Chelsea Garcia likes out door activities, 2 this year (2015)    SURGICAL HISTORY: Past Surgical History:  Procedure Laterality Date  . CATARACT EXTRACTION Left 02/07/2013  . TOTAL ABDOMINAL HYSTERECTOMY  1970's  . TUBAL LIGATION      SOCIAL HISTORY: Social History   Social History  . Marital status: Married    Spouse name: N/A  . Number of children: N/A  . Years of education: N/A   Occupational History  . Not on file.   Social History Main Topics  . Smoking status: Former Smoker    Packs/day: 0.25    Types: Cigarettes    Quit date: 07/01/1961  . Smokeless tobacco: Not on file  . Alcohol use No  . Drug use: No  . Sexual activity: Yes   Other Topics Concern  . Not on file   Social History Narrative   Patient lives in [place]. Married. Came by herself. Chelsea Garcia has got 2 sons ages 45 and 56 and 2 grankids boy 35 and daughter 13. 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     FAMILY HISTORY: Family History  Problem Relation Age of Onset  . Thyroid disease Mother   . Cancer Father   . Cancer  Maternal Aunt 80    ALLERGIES:  is allergic to pollen extract.  MEDICATIONS:  Current Outpatient Prescriptions  Medication Sig Dispense Refill  . aspirin 81 MG tablet Take 81 mg by mouth daily.    . Azelastine HCl 0.15 % SOLN     . bacitracin ophthalmic ointment Place 1 application into both eyes daily. Rub on eyelids    . bimatoprost (LUMIGAN) 0.03 % ophthalmic solution Place 1 drop into both eyes at bedtime.    . dorzolamide-timolol (COSOPT) 22.3-6.8 MG/ML ophthalmic solution Place 1 drop into both eyes 2 (two) times daily.    . hydroxyurea (HYDREA) 500 MG capsule TAKE 1 CAPSULE BY MOUTH ONCE DAILY WITH FOOD  TO MINIMIZE GI UPSET 30 capsule 5  . Probiotic Product (PROBIOTIC DAILY PO) Take 1 tablet by mouth daily.    . SUMAtriptan (IMITREX) 100 MG tablet Take 100 mg by mouth every 2 (two) hours as needed for migraine or headache. May repeat in 2 hours if headache persists or recurs.     No current facility-administered medications for this visit.     REVIEW OF SYSTEMS:   Constitutional: Denies fevers, chills or abnormal night sweats, + Fatigue Eyes: Denies blurriness of vision, double vision or watery eyes Ears, nose, mouth, throat, and face: Denies mucositis or sore throat Respiratory: Denies cough, dyspnea or wheezes, +seasonal allergies Cardiovascular: Denies palpitation, chest discomfort or lower extremity swelling Gastrointestinal:  Denies nausea, heartburn,+ change in bowel habits constipation alt with diarrhea Skin: Denies abnormal skin rashes Lymphatics: Denies new lymphadenopathy or easy bruising Neurological:Denies numbness, tingling or new weaknesses, low back pain Behavioral/Psych: Mood is stable, no new changes  All other systems were reviewed with the patient and are negative.  PHYSICAL EXAMINATION: ECOG PERFORMANCE STATUS: 0  Vitals:   09/17/15 1520  BP: 118/64  Pulse: 70  Resp: 16  Temp: 98 F (36.7 C)   Filed Weights   09/17/15 1520  Weight: 137 lb 14.4 oz (62.6 kg)    GENERAL:alert, no distress and comfortable SKIN: skin color, texture, turgor are normal, no rashes or significant lesions, except a a few rash on the left hip area.  EYES: normal, conjunctiva are pink and non-injected, sclera clear OROPHARYNX:no exudate, no erythema and lips, buccal mucosa, and tongue normal  NECK: supple, thyroid normal size, non-tender, without nodularity LYMPH:  no palpable lymphadenopathy in the cervical, axillary or inguinal LUNGS: clear to auscultation and percussion with normal breathing effort HEART: regular rate & rhythm and no murmurs and no lower extremity  edema ABDOMEN:abdomen soft, non-tender and normal bowel sounds, no HSM Musculoskeletal:no cyanosis of digits and no clubbing  PSYCH: alert & oriented x 3 with fluent speech NEURO: no focal motor/sensory deficits  LABORATORY DATA:  I have reviewed the data as listed  CBC Latest Ref Rng & Units 09/17/2015 06/25/2015 04/02/2015  WBC 3.9 - 10.3 10e3/uL 7.3 6.5 8.0  Hemoglobin 11.6 - 15.9 g/dL 14.0 14.7 15.0  Hematocrit 34.8 - 46.6 % 42.2 45.3 46.4  Platelets 145 - 400 10e3/uL 293 306 342    CMP Latest Ref Rng & Units 06/25/2015 12/17/2014 01/16/2014  Glucose 70 - 140 mg/dl 73 81 76  BUN 7.0 - 26.0 mg/dL 24.8 23.1 23.1  Creatinine 0.6 - 1.1 mg/dL 0.9 0.8 0.8  Sodium 136 - 145 mEq/L 143 143 144  Potassium 3.5 - 5.1 mEq/L 4.3 4.0 4.4  Chloride 96 - 112 mEq/L - - -  CO2 22 - 29 mEq/L 29 26 29   Calcium 8.4 -  10.4 mg/dL 9.9 9.7 9.8  Total Protein 6.4 - 8.3 g/dL 7.2 7.2 6.7  Total Bilirubin 0.20 - 1.20 mg/dL 0.77 0.71 0.62  Alkaline Phos 40 - 150 U/L 52 69 57  AST 5 - 34 U/L 15 14 18   ALT 0 - 55 U/L 12 10 13        RADIOGRAPHIC STUDIES: No new scans since last visit.   ASSESSMENT & PLAN:  Riah Fellenz is a pleasant 82 years old female in good health who presented with mild leukocytosis and thrombocytosis with plt count around 500-650K. JAK2 mutation was positive, BCR-ABL was negative. Chelsea Garcia has essential thrombocytosis. No history of thrombosis.  1. Essential thrombocytosis, JAK2(+) -Chelsea Garcia is tolerating hydroxyurea very well. Her plt has been normal and well controlled  -I reviewed her CBC results from today, and give her a copy of the report -We'll continue hydrea 500mg  daily, Chelsea Garcia is tolerating very well, CBC normal. -continue aspirin 81 mg once daily -We discussed the risk of thrombosis. Chelsea Garcia knows what to watch. -We discussed the goal of therapy is palliative, and prevent thrombosis. Unfortunately it is not curable, and there is a small risk in the involved to acute leukemia  and  myelofibrosis down the road.  Follow-up Repeat CBC every 3  months. I'll see her back in 6 months.    All questions were answered. The patient knows to call the clinic with any problems, questions or concerns.  I spent 10 minutes counseling the patient face to face. The total time spent in the appointment was 15 minutes and more than 50% was on counseling.    Truitt Merle  09/17/2015

## 2015-09-17 NOTE — Telephone Encounter (Signed)
09/17/2015 Appointments rescheduled per patient request. Patient missed morning appointments. 09/17/2015 Late afternoon appointments scheduled for patient to make up missed morning appointments.

## 2015-09-17 NOTE — Telephone Encounter (Signed)
Gave patient avs report and appointments for December and March.  °

## 2015-09-17 NOTE — Progress Notes (Deleted)
[facility] HEMATOLOGY FOLLOW UP NOTE DATE OF VISIT: 09/17/2015   Patient Care Team: Carlos Levering, PA-C as PCP - General (Family Medicine) Bernadene Bell, MD as Consulting Physician (Hematology)  DIAGNOSIS: Essential Thrombocytosis, diagnosed in Sep 2015  CURRENT THERAPY: Hydroxyuria 500mg  daily started 11/27/2013, and ASA 325mg  daily   HISTORY OF PRESENTING ILLNESS:  Chelsea Garcia 82 y.o. female  from [place] who was being referred here for an elevated platelet count and seen on 10/01/2013. She recently had blood testing done by her primary care Carlos Levering PA. CBC collected on 09/12/2013 showed a white count of 10.1, hemoglobin 17.4 g, hematocrit 54.7, MCV 87.5, and platelet count of 515. A repeat CBC was done on 09/18/2013. It showed a white count 11.5, hemoglobin 16.5, hematocrit 51, MCV 86 and platelet count was 546. Patient does not have any iron deficiency. She does not have any stressors such as recent surgery, inflammation, infection which can lead to reactive thrombocytosis. Her metabolic panel showed normal glucose, normal kidney function, electrolytes and liver function panel. A serum ferritin was 112. B12 was 521 normal. TSH 7.65 mildly elevated consistent with hypothyroidism. Free T4 0.86 normal. Vitamin D level was 37.2. Tissue transglutaminase IgA was less than 2.   INTERVAL HISTORY: Chelsea Garcia returns for follow-up. She is doing well, denies any new medical event since her last visit 6 months ago, no pain, dyspnea, leg swollen or other symptoms. She is very compliant with Hydrea, tolerates very well, no noticeable side effects.  Patient Active Problem List   Diagnosis Date Noted  . Essential thrombocytosis ([place]) 10/01/2013     MEDICAL HISTORY:  Past Medical History:  Diagnosis Date  . Colon polyps    Benign. 2 colonoscopies in past. Due now again.  . Endometriosis    required TAH when young  . Essential tremor    since 40's  . Glaucoma     started in 40's  . Low back pain    since 40's  . Migraine    started in 50's  . Osteopenia    2014  . Seasonal allergies    to Dust, mold and pollen.  . Thrombocytosis ([place]) September 2015  . Tick bites    she likes out door activities, 2 this year (2015)    SURGICAL HISTORY: Past Surgical History:  Procedure Laterality Date  . CATARACT EXTRACTION Left 02/07/2013  . TOTAL ABDOMINAL HYSTERECTOMY  1970's  . TUBAL LIGATION      SOCIAL HISTORY: Social History   Social History  . Marital status: Married    Spouse name: N/A  . Number of children: N/A  . Years of education: N/A   Occupational History  . Not on file.   Social History Main Topics  . Smoking status: Former Smoker    Packs/day: 0.25    Types: Cigarettes    Quit date: 07/01/1961  . Smokeless tobacco: Not on file  . Alcohol use No  . Drug use: No  . Sexual activity: Yes   Other Topics Concern  . Not on file   Social History Narrative   Patient lives in [place]. Married. Came by herself. She has got 2 sons ages 59 and 15 and 2 grankids boy 69 and daughter 53. She is active, walks every day, semi-retired accountant.     FAMILY HISTORY: Family History  Problem Relation Age of Onset  . Thyroid disease Mother   . Cancer Father   . Cancer Maternal Aunt 57  ALLERGIES:  is allergic to pollen extract.  MEDICATIONS:  Current Outpatient Prescriptions  Medication Sig Dispense Refill  . aspirin 81 MG tablet Take 81 mg by mouth daily.    . Azelastine HCl 0.15 % SOLN     . bacitracin ophthalmic ointment Place 1 application into both eyes daily. Rub on eyelids    . bimatoprost (LUMIGAN) 0.03 % ophthalmic solution Place 1 drop into both eyes at bedtime.    . dorzolamide-timolol (COSOPT) 22.3-6.8 MG/ML ophthalmic solution Place 1 drop into both eyes 2 (two) times daily.    . hydroxyurea (HYDREA) 500 MG capsule TAKE 1 CAPSULE BY MOUTH ONCE DAILY WITH FOOD TO MINIMIZE GI UPSET 30 capsule 5  . Probiotic  Product (PROBIOTIC DAILY PO) Take 1 tablet by mouth daily.    . SUMAtriptan (IMITREX) 100 MG tablet Take 100 mg by mouth every 2 (two) hours as needed for migraine or headache. May repeat in 2 hours if headache persists or recurs.     No current facility-administered medications for this visit.     REVIEW OF SYSTEMS:   Constitutional: Denies fevers, chills or abnormal night sweats, + Fatigue Eyes: Denies blurriness of vision, double vision or watery eyes Ears, nose, mouth, throat, and face: Denies mucositis or sore throat Respiratory: Denies cough, dyspnea or wheezes, +seasonal allergies Cardiovascular: Denies palpitation, chest discomfort or lower extremity swelling Gastrointestinal:  Denies nausea, heartburn,+ change in bowel habits constipation alt with diarrhea Skin: Denies abnormal skin rashes Lymphatics: Denies new lymphadenopathy or easy bruising Neurological:Denies numbness, tingling or new weaknesses, low back pain Behavioral/Psych: Mood is stable, no new changes  All other systems were reviewed with the patient and are negative.  PHYSICAL EXAMINATION: ECOG PERFORMANCE STATUS: 0  There were no vitals filed for this visit. There were no vitals filed for this visit.  GENERAL:alert, no distress and comfortable SKIN: skin color, texture, turgor are normal, no rashes or significant lesions, except a a few rash on the left hip area.  EYES: normal, conjunctiva are pink and non-injected, sclera clear OROPHARYNX:no exudate, no erythema and lips, buccal mucosa, and tongue normal  NECK: supple, thyroid normal size, non-tender, without nodularity LYMPH:  no palpable lymphadenopathy in the cervical, axillary or inguinal LUNGS: clear to auscultation and percussion with normal breathing effort HEART: regular rate & rhythm and no murmurs and no lower extremity edema ABDOMEN:abdomen soft, non-tender and normal bowel sounds, no HSM Musculoskeletal:no cyanosis of digits and no clubbing   PSYCH: alert & oriented x 3 with fluent speech NEURO: no focal motor/sensory deficits  LABORATORY DATA:  I have reviewed the data as listed  CBC Latest Ref Rng & Units 06/25/2015 04/02/2015 12/17/2014  WBC 3.9 - 10.3 10e3/uL 6.5 8.0 7.7  Hemoglobin 11.6 - 15.9 g/dL 14.7 15.0 15.2  Hematocrit 34.8 - 46.6 % 45.3 46.4 46.3  Platelets 145 - 400 10e3/uL 306 342 318    CMP Latest Ref Rng & Units 06/25/2015 12/17/2014 01/16/2014  Glucose 70 - 140 mg/dl 73 81 76  BUN 7.0 - 26.0 mg/dL 24.8 23.1 23.1  Creatinine 0.6 - 1.1 mg/dL 0.9 0.8 0.8  Sodium 136 - 145 mEq/L 143 143 144  Potassium 3.5 - 5.1 mEq/L 4.3 4.0 4.4  Chloride 96 - 112 mEq/L - - -  CO2 22 - 29 mEq/L 29 26 29   Calcium 8.4 - 10.4 mg/dL 9.9 9.7 9.8  Total Protein 6.4 - 8.3 g/dL 7.2 7.2 6.7  Total Bilirubin 0.20 - 1.20 mg/dL 0.77 0.71 0.62  Alkaline  Phos 40 - 150 U/L 52 69 57  AST 5 - 34 U/L 15 14 18   ALT 0 - 55 U/L 12 10 13        RADIOGRAPHIC STUDIES: No new scans since last visit.   ASSESSMENT & PLAN:  Chelsea Garcia is a pleasant 82 years old female in good health who presented with mild leukocytosis and thrombocytosis with plt count around 500-650K. JAK2 mutation was positive, BCR-ABL was negative. She has essential thrombocytosis. No history of thrombosis.  1. Essential thrombocytosis, JAK2(+) -She is tolerating hydroxyurea very well. Her plt has been normal and well controlled  -I reviewed her CBC results from today, and give him a copy of the report -We'll continue the current hydrea dose, she is tolerating very well, CBC normal. -continue aspirin 81 mg once daily -We discussed the risk of thrombosis. She knows what to watch. -We discussed the goal of therapy is palliative, and prevent thrombosis. Unfortunately it is not curable, and there is a small risk in the involved to acute leukemia  and myelofibrosis down the road.  Follow-up Repeat CBC every 3  months. I'll see her back in 6 months.    All questions were  answered. The patient knows to call the clinic with any problems, questions or concerns.  I spent 10 minutes counseling the patient face to face. The total time spent in the appointment was 15 minutes and more than 50% was on counseling.    Truitt Merle  09/17/2015

## 2015-10-03 DIAGNOSIS — G43909 Migraine, unspecified, not intractable, without status migrainosus: Secondary | ICD-10-CM | POA: Diagnosis not present

## 2015-10-07 ENCOUNTER — Ambulatory Visit: Payer: Medicare Other | Admitting: Podiatry

## 2015-10-30 DIAGNOSIS — Z23 Encounter for immunization: Secondary | ICD-10-CM | POA: Diagnosis not present

## 2015-11-20 DIAGNOSIS — H401113 Primary open-angle glaucoma, right eye, severe stage: Secondary | ICD-10-CM | POA: Diagnosis not present

## 2015-11-20 DIAGNOSIS — H401123 Primary open-angle glaucoma, left eye, severe stage: Secondary | ICD-10-CM | POA: Diagnosis not present

## 2015-11-23 DIAGNOSIS — R197 Diarrhea, unspecified: Secondary | ICD-10-CM | POA: Diagnosis not present

## 2015-12-11 ENCOUNTER — Telehealth: Payer: Self-pay | Admitting: Hematology

## 2015-12-11 NOTE — Telephone Encounter (Signed)
Appointment rescheduled per patient request. She has prior appointments on the same day.

## 2015-12-16 ENCOUNTER — Other Ambulatory Visit: Payer: Self-pay | Admitting: Medical Oncology

## 2015-12-16 ENCOUNTER — Other Ambulatory Visit: Payer: Self-pay | Admitting: Hematology

## 2015-12-16 ENCOUNTER — Telehealth: Payer: Self-pay | Admitting: Medical Oncology

## 2015-12-16 DIAGNOSIS — D473 Essential (hemorrhagic) thrombocythemia: Secondary | ICD-10-CM

## 2015-12-16 MED ORDER — HYDROXYUREA 500 MG PO CAPS: 500.0000 mg | ORAL_CAPSULE | Freq: Every day | ORAL | 0 refills | Status: DC

## 2015-12-16 MED ORDER — HYDROXYUREA 500 MG PO CAPS: 500.0000 mg | ORAL_CAPSULE | Freq: Every day | ORAL | 1 refills | Status: DC

## 2015-12-16 NOTE — Telephone Encounter (Signed)
I refilled.   Alaja Goldinger MD  

## 2015-12-16 NOTE — Telephone Encounter (Signed)
Requests Hydrea [facility]ill [facility] provider office for request. I sent rx refill request for Dr Burr Medico to approve. Next appt in march .

## 2015-12-17 ENCOUNTER — Other Ambulatory Visit: Payer: Medicare Other

## 2015-12-21 ENCOUNTER — Other Ambulatory Visit: Payer: Medicare Other

## 2015-12-23 ENCOUNTER — Telehealth: Payer: Self-pay | Admitting: Hematology

## 2015-12-23 NOTE — Telephone Encounter (Signed)
Missed lab appointment rescheduled per patient request.

## 2015-12-24 ENCOUNTER — Other Ambulatory Visit (HOSPITAL_BASED_OUTPATIENT_CLINIC_OR_DEPARTMENT_OTHER): Payer: Medicare Other

## 2015-12-24 DIAGNOSIS — D473 Essential (hemorrhagic) thrombocythemia: Secondary | ICD-10-CM | POA: Diagnosis present

## 2015-12-24 LAB — CBC WITH DIFFERENTIAL/PLATELET
BASO%: 0.2 % (ref 0.0–2.0)
BASOS ABS: 0 10*3/uL (ref 0.0–0.1)
EOS%: 2.6 % (ref 0.0–7.0)
Eosinophils Absolute: 0.1 10*3/uL (ref 0.0–0.5)
HEMATOCRIT: 38.7 % (ref 34.8–46.6)
HEMOGLOBIN: 12.7 g/dL (ref 11.6–15.9)
LYMPH#: 0.6 10*3/uL — AB (ref 0.9–3.3)
LYMPH%: 11.8 % — ABNORMAL LOW (ref 14.0–49.7)
MCH: 32.2 pg (ref 25.1–34.0)
MCHC: 32.8 g/dL (ref 31.5–36.0)
MCV: 98.2 fL (ref 79.5–101.0)
MONO#: 0.4 10*3/uL (ref 0.1–0.9)
MONO%: 7.3 % (ref 0.0–14.0)
NEUT%: 78.1 % — ABNORMAL HIGH (ref 38.4–76.8)
NEUTROS ABS: 3.8 10*3/uL (ref 1.5–6.5)
Platelets: 246 10*3/uL (ref 145–400)
RBC: 3.94 10*6/uL (ref 3.70–5.45)
RDW: 15.2 % — AB (ref 11.2–14.5)
WBC: 4.9 10*3/uL (ref 3.9–10.3)

## 2015-12-29 DIAGNOSIS — L720 Epidermal cyst: Secondary | ICD-10-CM | POA: Diagnosis not present

## 2016-01-15 DIAGNOSIS — D2312 Other benign neoplasm of skin of left eyelid, including canthus: Secondary | ICD-10-CM | POA: Diagnosis not present

## 2016-03-07 NOTE — Progress Notes (Signed)
[place] HEMATOLOGY FOLLOW UP NOTE DATE OF VISIT: 03/10/2016   Patient Care Team: Carlos Levering, PA-C as PCP - General (Family Medicine) Bernadene Bell, MD (Inactive) as Consulting Physician (Hematology)  DIAGNOSIS: Essential Thrombocytosis, diagnosed in Sep 2015  CURRENT THERAPY: Hydroxyuria 500mg  daily started 11/27/2013, and Aspirin 81mg  daily   HISTORY OF PRESENTING ILLNESS:  Chelsea Garcia 83 y.o. female  from [place] who was being referred here for an elevated platelet count and seen on 10/01/2013. She recently had blood testing done by her primary care Carlos Levering PA. CBC collected on 09/12/2013 showed a white count of 10.1, hemoglobin 17.4 g, hematocrit 54.7, MCV 87.5, and platelet count of 515. A repeat CBC was done on 09/18/2013. It showed a white count 11.5, hemoglobin 16.5, hematocrit 51, MCV 86 and platelet count was 546. Patient does not have any iron deficiency. She does not have any stressors such as recent surgery, inflammation, infection which can lead to reactive thrombocytosis. Her metabolic panel showed normal glucose, normal kidney function, electrolytes and liver function panel. A serum ferritin was 112. B12 was 521 normal. TSH 7.65 mildly elevated consistent with hypothyroidism. Free T4 0.86 normal. Vitamin D level was 37.2. Tissue transglutaminase IgA was less than 2.   INTERVAL HISTORY: Chelsea Garcia returns for follow-up. She is doing very well overall. She denies any problems at this time. She reports being compliant with Hydrea and is tolerating it well. She has good appetite and energy level.  Patient Active Problem List   Diagnosis Date Noted  . Essential thrombocytosis ([place]) 10/01/2013     MEDICAL HISTORY:  Past Medical History:  Diagnosis Date  . Colon polyps    Benign. 2 colonoscopies in past. Due now again.  . Endometriosis    required TAH when young  . Essential tremor    since 40's  . Glaucoma    started in 40's  . Low  back pain    since 40's  . Migraine    started in 50's  . Osteopenia    2014  . Seasonal allergies    to Dust, mold and pollen.  . Thrombocytosis ([place]) September 2015  . Tick bites    she likes out door activities, 2 this year (2015)    SURGICAL HISTORY: Past Surgical History:  Procedure Laterality Date  . CATARACT EXTRACTION Left 02/07/2013  . TOTAL ABDOMINAL HYSTERECTOMY  1970's  . TUBAL LIGATION      SOCIAL HISTORY: Social History   Social History  . Marital status: Married    Spouse name: N/A  . Number of children: N/A  . Years of education: N/A   Occupational History  . Not on file.   Social History Main Topics  . Smoking status: Former Smoker    Packs/day: 0.25    Types: Cigarettes    Quit date: 07/01/1961  . Smokeless tobacco: Never Used  . Alcohol use No  . Drug use: No  . Sexual activity: Yes   Other Topics Concern  . Not on file   Social History Narrative   Patient lives in [place]. Married. Came by herself. She has got 2 sons ages 46 and 81 and 2 grankids boy 66 and daughter 46. She is active, walks every day, semi-retired accountant.     FAMILY HISTORY: Family History  Problem Relation Age of Onset  . Thyroid disease Mother   . Cancer Father   . Cancer Maternal Aunt 31    ALLERGIES:  is allergic to pollen  extract.  MEDICATIONS:  Current Outpatient Prescriptions  Medication Sig Dispense Refill  . aspirin 81 MG tablet Take 81 mg by mouth daily.    . Azelastine HCl 0.15 % SOLN     . bacitracin ophthalmic ointment Place 1 application into both eyes daily. Rub on eyelids    . bimatoprost (LUMIGAN) 0.03 % ophthalmic solution Place 1 drop into both eyes at bedtime.    . dorzolamide-timolol (COSOPT) 22.3-6.8 MG/ML ophthalmic solution Place 1 drop into both eyes 2 (two) times daily.    . hydroxyurea (HYDREA) 500 MG capsule Take 1 capsule (500 mg total) by mouth daily. May take with food to minimize GI side effects. 90 capsule 1  . Probiotic  Product (PROBIOTIC DAILY PO) Take 1 tablet by mouth daily.    . SUMAtriptan (IMITREX) 100 MG tablet Take 100 mg by mouth every 2 (two) hours as needed for migraine or headache. May repeat in 2 hours if headache persists or recurs.     No current facility-administered medications for this visit.     REVIEW OF SYSTEMS:   Constitutional: Denies fevers, chills or abnormal night sweats Eyes: Denies blurriness of vision, double vision or watery eyes Ears, nose, mouth, throat, and face: Denies mucositis or sore throat Respiratory: Denies cough, dyspnea or wheezes, +seasonal allergies Cardiovascular: Denies palpitation, chest discomfort or lower extremity swelling Gastrointestinal:  Denies nausea, heartburn, no bowel complaints Skin: Denies abnormal skin rashes Lymphatics: Denies new lymphadenopathy or easy bruising Neurological:Denies numbness, tingling or new weaknesses, low back pain Behavioral/Psych: Mood is stable, no new changes  All other systems were reviewed with the patient and are negative.  PHYSICAL EXAMINATION: ECOG PERFORMANCE STATUS: 0  Vitals:   03/10/16 1258  BP: 116/66  Pulse: 70  Resp: 17  Temp: 98 F (36.7 C)   Filed Weights   03/10/16 1258  Weight: 133 lb 12.8 oz (60.7 kg)    GENERAL:alert, no distress and comfortable SKIN: skin color, texture, turgor are normal, no rashes or significant lesions EYES: normal, conjunctiva are pink and non-injected, sclera clear OROPHARYNX:no exudate, no erythema and lips, buccal mucosa, and tongue normal  NECK: supple, thyroid normal size, non-tender, without nodularity LYMPH:  no palpable lymphadenopathy in the cervical, axillary or inguinal LUNGS: clear to auscultation and percussion with normal breathing effort HEART: regular rate & rhythm and no murmurs and no lower extremity edema ABDOMEN:abdomen soft, non-tender and normal bowel sounds, no HSM Musculoskeletal:no cyanosis of digits and no clubbing  PSYCH: alert & oriented  x 3 with fluent speech NEURO: no focal motor/sensory deficits  LABORATORY DATA:  I have reviewed the data as listed  CBC Latest Ref Rng & Units 03/10/2016 12/24/2015 09/17/2015  WBC 3.9 - 10.3 10e3/uL 7.6 4.9 7.3  Hemoglobin 11.6 - 15.9 g/dL 14.4 12.7 14.0  Hematocrit 34.8 - 46.6 % 44.5 38.7 42.2  Platelets 145 - 400 10e3/uL 280 246 293    CMP Latest Ref Rng & Units 03/10/2016 06/25/2015 12/17/2014  Glucose 70 - 140 mg/dl 87 73 81  BUN 7.0 - 26.0 mg/dL 21.1 24.8 23.1  Creatinine 0.6 - 1.1 mg/dL 0.8 0.9 0.8  Sodium 136 - 145 mEq/L 142 143 143  Potassium 3.5 - 5.1 mEq/L 4.3 4.3 4.0  Chloride 96 - 112 mEq/L - - -  CO2 22 - 29 mEq/L 28 29 26   Calcium 8.4 - 10.4 mg/dL 10.0 9.9 9.7  Total Protein 6.4 - 8.3 g/dL 7.1 7.2 7.2  Total Bilirubin 0.20 - 1.20 mg/dL 0.58 0.77  0.71  Alkaline Phos 40 - 150 U/L 58 52 69  AST 5 - 34 U/L 14 15 14   ALT 0 - 55 U/L 11 12 10        RADIOGRAPHIC STUDIES: No new scans since last visit.   ASSESSMENT & PLAN:  Chelsea Garcia is a pleasant 83 y.o. female in good health who presented with mild leukocytosis and thrombocytosis with plt count around 500-650K. JAK2 mutation was positive, BCR-ABL was negative. She has essential thrombocytosis. No history of thrombosis.  1. Essential thrombocytosis, JAK2(+) -due to her advanced age, she is at high risk for thrombosis secondary to ET, and hydrea is recommend.  -She is clinically doing very well, tolerating hydroxyurea without any noticeable side effects. Her plt has been normal and well controlled  -I reviewed her CBC results from today, and give her a copy of the report -We'll continue hydrea 500mg  daily, she is tolerating very well, CBC normal. -continue aspirin 81 mg once daily -We discussed the risk of thrombosis. She knows what to watch. -We discussed the goal of therapy is palliative, and prevent thrombosis. Unfortunately it is not curable, and there is a small risk in the involved to acute leukemia  and  myelofibrosis down the road.  Follow-up Repeat CBC every 3  months. I'll see her back in 6 months.  All questions were answered. The patient knows to call the clinic with any problems, questions or concerns.  I spent 10 minutes counseling the patient face to face. The total time spent in the appointment was 15 minutes and more than 50% was on counseling.    Truitt Merle  03/10/2016   This document serves as a record of services personally performed by Truitt Merle, MD. It was created on her behalf by Darcus Austin, a trained medical scribe. The creation of this record is based on the scribe's personal observations and the provider's statements to them. This document has been checked and approved by the attending provider.

## 2016-03-10 ENCOUNTER — Other Ambulatory Visit (HOSPITAL_BASED_OUTPATIENT_CLINIC_OR_DEPARTMENT_OTHER): Payer: Medicare Other

## 2016-03-10 ENCOUNTER — Ambulatory Visit (HOSPITAL_BASED_OUTPATIENT_CLINIC_OR_DEPARTMENT_OTHER): Payer: Medicare Other | Admitting: Hematology

## 2016-03-10 ENCOUNTER — Telehealth: Payer: Self-pay | Admitting: Hematology

## 2016-03-10 ENCOUNTER — Encounter: Payer: Self-pay | Admitting: Hematology

## 2016-03-10 VITALS — BP 116/66 | HR 70 | Temp 98.0°F | Resp 17 | Ht 62.0 in | Wt 133.8 lb

## 2016-03-10 DIAGNOSIS — D473 Essential (hemorrhagic) thrombocythemia: Secondary | ICD-10-CM

## 2016-03-10 LAB — COMPREHENSIVE METABOLIC PANEL
ALK PHOS: 58 U/L (ref 40–150)
ALT: 11 U/L (ref 0–55)
AST: 14 U/L (ref 5–34)
Albumin: 4.3 g/dL (ref 3.5–5.0)
Anion Gap: 7 mEq/L (ref 3–11)
BUN: 21.1 mg/dL (ref 7.0–26.0)
CHLORIDE: 108 meq/L (ref 98–109)
CO2: 28 meq/L (ref 22–29)
Calcium: 10 mg/dL (ref 8.4–10.4)
Creatinine: 0.8 mg/dL (ref 0.6–1.1)
EGFR: 68 mL/min/{1.73_m2} — AB (ref >90)
GLUCOSE: 87 mg/dL (ref 70–140)
POTASSIUM: 4.3 meq/L (ref 3.5–5.1)
SODIUM: 142 meq/L (ref 136–145)
Total Bilirubin: 0.58 mg/dL (ref 0.20–1.20)
Total Protein: 7.1 g/dL (ref 6.4–8.3)

## 2016-03-10 LAB — CBC WITH DIFFERENTIAL/PLATELET
BASO%: 0.3 % (ref 0.0–2.0)
BASOS ABS: 0 10*3/uL (ref 0.0–0.1)
EOS ABS: 0.2 10*3/uL (ref 0.0–0.5)
EOS%: 2 % (ref 0.0–7.0)
HCT: 44.5 % (ref 34.8–46.6)
HEMOGLOBIN: 14.4 g/dL (ref 11.6–15.9)
LYMPH%: 10.7 % — ABNORMAL LOW (ref 14.0–49.7)
MCH: 31.9 pg (ref 25.1–34.0)
MCHC: 32.4 g/dL (ref 31.5–36.0)
MCV: 98.5 fL (ref 79.5–101.0)
MONO#: 0.3 10*3/uL (ref 0.1–0.9)
MONO%: 3.8 % (ref 0.0–14.0)
NEUT#: 6.3 10*3/uL (ref 1.5–6.5)
NEUT%: 83.2 % — AB (ref 38.4–76.8)
Platelets: 280 10*3/uL (ref 145–400)
RBC: 4.52 10*6/uL (ref 3.70–5.45)
RDW: 14.9 % — AB (ref 11.2–14.5)
WBC: 7.6 10*3/uL (ref 3.9–10.3)
lymph#: 0.8 10*3/uL — ABNORMAL LOW (ref 0.9–3.3)

## 2016-03-10 NOTE — Telephone Encounter (Signed)
Gave patient AVS and calender for June and September per 03/10/2016 los.

## 2016-03-13 ENCOUNTER — Other Ambulatory Visit: Payer: Self-pay | Admitting: Hematology

## 2016-03-13 DIAGNOSIS — D473 Essential (hemorrhagic) thrombocythemia: Secondary | ICD-10-CM

## 2016-04-29 DIAGNOSIS — H401133 Primary open-angle glaucoma, bilateral, severe stage: Secondary | ICD-10-CM | POA: Diagnosis not present

## 2016-04-29 DIAGNOSIS — H26493 Other secondary cataract, bilateral: Secondary | ICD-10-CM | POA: Diagnosis not present

## 2016-04-29 DIAGNOSIS — H52203 Unspecified astigmatism, bilateral: Secondary | ICD-10-CM | POA: Diagnosis not present

## 2016-06-10 ENCOUNTER — Other Ambulatory Visit: Payer: Self-pay | Admitting: Hematology

## 2016-06-10 ENCOUNTER — Other Ambulatory Visit (HOSPITAL_BASED_OUTPATIENT_CLINIC_OR_DEPARTMENT_OTHER): Payer: Medicare Other

## 2016-06-10 DIAGNOSIS — D473 Essential (hemorrhagic) thrombocythemia: Secondary | ICD-10-CM

## 2016-06-10 LAB — CBC WITH DIFFERENTIAL/PLATELET
BASO%: 0.4 % (ref 0.0–2.0)
Basophils Absolute: 0 10*3/uL (ref 0.0–0.1)
EOS%: 1.8 % (ref 0.0–7.0)
Eosinophils Absolute: 0.1 10*3/uL (ref 0.0–0.5)
HCT: 39.8 % (ref 34.8–46.6)
HGB: 13.6 g/dL (ref 11.6–15.9)
LYMPH%: 9 % — AB (ref 14.0–49.7)
MCH: 33.4 pg (ref 25.1–34.0)
MCHC: 34.3 g/dL (ref 31.5–36.0)
MCV: 97.5 fL (ref 79.5–101.0)
MONO#: 0.2 10*3/uL (ref 0.1–0.9)
MONO%: 3.8 % (ref 0.0–14.0)
NEUT%: 85 % — ABNORMAL HIGH (ref 38.4–76.8)
NEUTROS ABS: 4.7 10*3/uL (ref 1.5–6.5)
Platelets: 267 10*3/uL (ref 145–400)
RBC: 4.08 10*6/uL (ref 3.70–5.45)
RDW: 16.5 % — ABNORMAL HIGH (ref 11.2–14.5)
WBC: 5.5 10*3/uL (ref 3.9–10.3)
lymph#: 0.5 10*3/uL — ABNORMAL LOW (ref 0.9–3.3)

## 2016-06-10 MED ORDER — HYDROXYUREA 500 MG PO CAPS: ORAL_CAPSULE | ORAL | 1 refills | Status: DC

## 2016-08-25 ENCOUNTER — Encounter: Payer: Self-pay | Admitting: Podiatry

## 2016-08-25 ENCOUNTER — Ambulatory Visit (INDEPENDENT_AMBULATORY_CARE_PROVIDER_SITE_OTHER): Payer: Medicare Other | Admitting: Podiatry

## 2016-08-25 DIAGNOSIS — M79609 Pain in unspecified limb: Secondary | ICD-10-CM | POA: Diagnosis not present

## 2016-08-25 DIAGNOSIS — B351 Tinea unguium: Secondary | ICD-10-CM | POA: Diagnosis not present

## 2016-08-25 NOTE — Progress Notes (Signed)
   Subjective:    Patient ID: Chelsea Garcia, female    DOB: 1933/05/10, 83 y.o.   MRN: [id number]  HPI  Patient presents here today for B/L toenail trim,right ball of foot pain. This patient presents to the office with long thick painful nails both feet.  Her right big toenail is especially thick and painful due to trauma.  She has pain walking and wearing her shoes.  No history of diabetes.  Review of Systems  All other systems reviewed and are negative.      Objective:   Physical Exam GENERAL APPEARANCE: Alert, conversant. Appropriately groomed. No acute distress.  VASCULAR: Pedal pulses palpable at  [facility] and PT bilateral.  Capillary refill time is immediate to all digits,  Normal temperature gradient.  Digital hair growth is present bilateral  NEUROLOGIC: sensation is normal to 5.07 monofilament at 5/5 sites bilateral.  Light touch is intact bilateral, Muscle strength normal.  MUSCULOSKELETAL: acceptable muscle strength, tone and stability bilateral.  Intrinsic muscluature intact bilateral.  Rectus appearance of foot and digits noted bilateral.   DERMATOLOGIC: skin color, texture, and turgor are within normal limits.  No preulcerative lesions or ulcers  are seen, no interdigital maceration noted.  No open lesions present.  . No drainage noted.  NAILS  Thick disfigured discolored nails both feet.         Assessment & Plan:  Onychomycosis  B/L  Debridement and grinding of long painful nails. RTC 3 months   Gardiner Barefoot DPM

## 2016-09-14 NOTE — Progress Notes (Signed)
[place] HEMATOLOGY FOLLOW UP NOTE DATE OF VISIT: 09/16/2016   Patient Care Team: Carlos Levering, PA-C as PCP - General (Family Medicine) Bernadene Bell, MD (Inactive) as Consulting Physician (Hematology)  DIAGNOSIS: Essential Thrombocytosis, diagnosed in Sep 2015  HISTORY OF PRESENTING ILLNESS:  Megan Presti 83 y.o. female  from [place] who was being referred here for an elevated platelet count and seen on 10/01/2013. She recently had blood testing done by her primary care Carlos Levering PA. CBC collected on 09/12/2013 showed a white count of 10.1, hemoglobin 17.4 g, hematocrit 54.7, MCV 87.5, and platelet count of 515. A repeat CBC was done on 09/18/2013. It showed a white count 11.5, hemoglobin 16.5, hematocrit 51, MCV 86 and platelet count was 546. Patient does not have any iron deficiency. She does not have any stressors such as recent surgery, inflammation, infection which can lead to reactive thrombocytosis. Her metabolic panel showed normal glucose, normal kidney function, electrolytes and liver function panel. A serum ferritin was 112. B12 was 521 normal. TSH 7.65 mildly elevated consistent with hypothyroidism. Free T4 0.86 normal. Vitamin D level was 37.2. Tissue transglutaminase IgA was less than 2.   CURRENT THERAPY: Hydroxyuria 500mg  daily started 11/27/2013, and Aspirin 81mg  daily    INTERVAL HISTORY:  Jaclynn returns for follow-up. She presents to the clinic today noting she is taking hydrea and she is taking ASA and a tolerating well. She denies anything new since last visit. She is doing very well overall. No pain, dyspnea or other symptoms.    Patient Active Problem List   Diagnosis Date Noted  . Essential thrombocytosis ([place]) 10/01/2013     MEDICAL HISTORY:  Past Medical History:  Diagnosis Date  . Colon polyps    Benign. 2 colonoscopies in past. Due now again.  . Endometriosis    required TAH when young  . Essential tremor     since 40's  . Glaucoma    started in 40's  . Low back pain    since 40's  . Migraine    started in 50's  . Osteopenia    2014  . Seasonal allergies    to Dust, mold and pollen.  . Thrombocytosis ([place]) September 2015  . Tick bites    she likes out door activities, 2 this year (2015)    SURGICAL HISTORY: Past Surgical History:  Procedure Laterality Date  . CATARACT EXTRACTION Left 02/07/2013  . TOTAL ABDOMINAL HYSTERECTOMY  1970's  . TUBAL LIGATION      SOCIAL HISTORY: Social History   Social History  . Marital status: Married    Spouse name: N/A  . Number of children: N/A  . Years of education: N/A   Occupational History  . Not on file.   Social History Main Topics  . Smoking status: Former Smoker    Packs/day: 0.25    Types: Cigarettes    Quit date: 07/01/1961  . Smokeless tobacco: Never Used  . Alcohol use No  . Drug use: No  . Sexual activity: Yes   Other Topics Concern  . Not on file   Social History Narrative   Patient lives in [place]. Married. Came by herself. She has got 2 sons ages 40 and 22 and 2 grankids boy 45 and daughter 31. She is active, walks every day, semi-retired accountant.     FAMILY HISTORY: Family History  Problem Relation Age of Onset  . Thyroid disease Mother   . Cancer Father   . Cancer  Maternal Aunt 80    ALLERGIES:  is allergic to pollen extract.  MEDICATIONS:  Current Outpatient Prescriptions  Medication Sig Dispense Refill  . aspirin 81 MG tablet Take 81 mg by mouth daily.    . Azelastine HCl 0.15 % SOLN     . bacitracin ophthalmic ointment Place 1 application into both eyes daily. Rub on eyelids    . bimatoprost (LUMIGAN) 0.03 % ophthalmic solution Place 1 drop into both eyes at bedtime.    . dorzolamide-timolol (COSOPT) 22.3-6.8 MG/ML ophthalmic solution Place 1 drop into both eyes 2 (two) times daily.    . hydroxyurea (HYDREA) 500 MG capsule TAKE ONE CAPSULE BY MOUTH DAILY WITH FOOD TO MINIMIZE GI SIDE EFFECTS 90  capsule 1  . Probiotic Product (PROBIOTIC DAILY PO) Take 1 tablet by mouth daily.    . SUMAtriptan (IMITREX) 100 MG tablet Take 100 mg by mouth every 2 (two) hours as needed for migraine or headache. May repeat in 2 hours if headache persists or recurs.     No current facility-administered medications for this visit.     REVIEW OF SYSTEMS:   Constitutional: Denies fevers, chills or abnormal night sweats Eyes: Denies blurriness of vision, double vision or watery eyes Ears, nose, mouth, throat, and face: Denies mucositis or sore throat Respiratory: Denies cough, dyspnea or wheezes, +seasonal allergies Cardiovascular: Denies palpitation, chest discomfort or lower extremity swelling Gastrointestinal:  Denies nausea, heartburn, no bowel complaints Skin: Denies abnormal skin rashes Lymphatics: Denies new lymphadenopathy or easy bruising Neurological:Denies numbness, tingling or new weaknesses, low back pain Behavioral/Psych: Mood is stable, no new changes  All other systems were reviewed with the patient and are negative.  PHYSICAL EXAMINATION: ECOG PERFORMANCE STATUS: 0  Vitals:   09/16/16 0928  BP: (!) 109/48  Pulse: 66  Resp: 20  Temp: 97.8 F (36.6 C)  SpO2: 98%   Filed Weights   09/16/16 0928  Weight: 132 lb 4.8 oz (60 kg)    GENERAL:alert, no distress and comfortable SKIN: skin color, texture, turgor are normal, no rashes or significant lesions EYES: normal, conjunctiva are pink and non-injected, sclera clear OROPHARYNX:no exudate, no erythema and lips, buccal mucosa, and tongue normal  NECK: supple, thyroid normal size, non-tender, without nodularity LYMPH:  no palpable lymphadenopathy in the cervical, axillary or inguinal LUNGS: clear to auscultation and percussion with normal breathing effort HEART: regular rate & rhythm and no murmurs and no lower extremity edema ABDOMEN:abdomen soft, non-tender and normal bowel sounds, no HSM Musculoskeletal:no cyanosis of digits  and no clubbing  PSYCH: alert & oriented x 3 with fluent speech NEURO: no focal motor/sensory deficits  LABORATORY DATA:  I have reviewed the data as listed  CBC Latest Ref Rng & Units 09/16/2016 06/10/2016 03/10/2016  WBC 3.9 - 10.3 10e3/uL 5.7 5.5 7.6  Hemoglobin 11.6 - 15.9 g/dL 13.3 13.6 14.4  Hematocrit 34.8 - 46.6 % 40.7 39.8 44.5  Platelets 145 - 400 10e3/uL 263 267 280    CMP Latest Ref Rng & Units 03/10/2016 06/25/2015 12/17/2014  Glucose 70 - 140 mg/dl 87 73 81  BUN 7.0 - 26.0 mg/dL 21.1 24.8 23.1  Creatinine 0.6 - 1.1 mg/dL 0.8 0.9 0.8  Sodium 136 - 145 mEq/L 142 143 143  Potassium 3.5 - 5.1 mEq/L 4.3 4.3 4.0  Chloride 96 - 112 mEq/L - - -  CO2 22 - 29 mEq/L 28 29 26   Calcium 8.4 - 10.4 mg/dL 10.0 9.9 9.7  Total Protein 6.4 - 8.3 g/dL 7.1  7.2 7.2  Total Bilirubin 0.20 - 1.20 mg/dL 0.58 0.77 0.71  Alkaline Phos 40 - 150 U/L 58 52 69  AST 5 - 34 U/L 14 15 14   ALT 0 - 55 U/L 11 12 10        RADIOGRAPHIC STUDIES: No new scans since last visit.   ASSESSMENT & PLAN:  Anisha Starliper is a pleasant 83 y.o. female in good health who presented with mild leukocytosis and thrombocytosis with plt count around 500-650K. JAK2 mutation was positive, BCR-ABL was negative. She has essential thrombocytosis. No history of thrombosis.  1. Essential thrombocytosis, JAK2(+) -due to her advanced age, she is at high risk for thrombosis secondary to ET, and hydrea is recommend.  -She is clinically doing very well, tolerating hydroxyurea without any noticeable side effects. Her plt have previously been normal and well controlled  -We previously discussed the risk of thrombosis. She knows what to watch. -We previously discussed the goal of therapy is palliative, and prevent thrombosis. Unfortunately it is not curable, and there is a small risk in the involved to acute leukemia  and myelofibrosis down the road. -We'll continue hydrea 500mg  daily, she is tolerating very well, CBC normal, anemia resolved.   -She has been taking hydrea for 3 years now. I discussed the side effects and risk of hydrea and she understands to keep taking ASA  -continue aspirin 81 mg once daily -She has changed her PCP with [facility]  -F/iu in 6 months    Follow-up: Repeat CBC every 3  months. I'll see her back in 6 months.  All questions were answered. The patient knows to call the clinic with any problems, questions or concerns.  I spent 10 minutes counseling the patient face to face. The total time spent in the appointment was 15 minutes and more than 50% was on counseling.    Truitt Merle  09/16/2016   This document serves as a record of services personally performed by Truitt Merle, MD. It was created on her behalf by Joslyn Devon, a trained medical scribe. The creation of this record is based on the scribe's personal observations and the provider's statements to them. This document has been checked and approved by the attending provider.

## 2016-09-16 ENCOUNTER — Ambulatory Visit (HOSPITAL_BASED_OUTPATIENT_CLINIC_OR_DEPARTMENT_OTHER): Payer: Medicare Other | Admitting: Hematology

## 2016-09-16 ENCOUNTER — Encounter: Payer: Self-pay | Admitting: Hematology

## 2016-09-16 ENCOUNTER — Other Ambulatory Visit (HOSPITAL_BASED_OUTPATIENT_CLINIC_OR_DEPARTMENT_OTHER): Payer: Medicare Other

## 2016-09-16 ENCOUNTER — Telehealth: Payer: Self-pay

## 2016-09-16 VITALS — BP 109/48 | HR 66 | Temp 97.8°F | Resp 20 | Ht 62.0 in | Wt 132.3 lb

## 2016-09-16 DIAGNOSIS — D473 Essential (hemorrhagic) thrombocythemia: Secondary | ICD-10-CM | POA: Diagnosis not present

## 2016-09-16 LAB — COMPREHENSIVE METABOLIC PANEL
ALK PHOS: 49 U/L (ref 40–150)
ALT: 9 U/L (ref 0–55)
AST: 15 U/L (ref 5–34)
Albumin: 4.2 g/dL (ref 3.5–5.0)
Anion Gap: 8 mEq/L (ref 3–11)
BILIRUBIN TOTAL: 0.79 mg/dL (ref 0.20–1.20)
BUN: 18.3 mg/dL (ref 7.0–26.0)
CO2: 23 meq/L (ref 22–29)
Calcium: 9.8 mg/dL (ref 8.4–10.4)
Chloride: 111 mEq/L — ABNORMAL HIGH (ref 98–109)
Creatinine: 0.8 mg/dL (ref 0.6–1.1)
EGFR: 66 mL/min/{1.73_m2} — AB (ref >90)
Glucose: 107 mg/dl (ref 70–140)
POTASSIUM: 4 meq/L (ref 3.5–5.1)
SODIUM: 142 meq/L (ref 136–145)
TOTAL PROTEIN: 7.1 g/dL (ref 6.4–8.3)

## 2016-09-16 LAB — CBC WITH DIFFERENTIAL/PLATELET
BASO%: 0.2 % (ref 0.0–2.0)
Basophils Absolute: 0 10*3/uL (ref 0.0–0.1)
EOS%: 1.2 % (ref 0.0–7.0)
Eosinophils Absolute: 0.1 10*3/uL (ref 0.0–0.5)
HCT: 40.7 % (ref 34.8–46.6)
HGB: 13.3 g/dL (ref 11.6–15.9)
LYMPH%: 8.7 % — AB (ref 14.0–49.7)
MCH: 33 pg (ref 25.1–34.0)
MCHC: 32.7 g/dL (ref 31.5–36.0)
MCV: 101 fL (ref 79.5–101.0)
MONO#: 0.3 10*3/uL (ref 0.1–0.9)
MONO%: 4.4 % (ref 0.0–14.0)
NEUT%: 85.5 % — ABNORMAL HIGH (ref 38.4–76.8)
NEUTROS ABS: 4.9 10*3/uL (ref 1.5–6.5)
Platelets: 263 10*3/uL (ref 145–400)
RBC: 4.03 10*6/uL (ref 3.70–5.45)
RDW: 15.3 % — ABNORMAL HIGH (ref 11.2–14.5)
WBC: 5.7 10*3/uL (ref 3.9–10.3)
lymph#: 0.5 10*3/uL — ABNORMAL LOW (ref 0.9–3.3)

## 2016-09-16 NOTE — Telephone Encounter (Signed)
Gave patient avs and calender for 12/7 and 03/17/17. Per los

## 2016-09-17 ENCOUNTER — Encounter: Payer: Self-pay | Admitting: Hematology

## 2016-09-21 DIAGNOSIS — J302 Other seasonal allergic rhinitis: Secondary | ICD-10-CM | POA: Diagnosis not present

## 2016-09-21 DIAGNOSIS — J342 Deviated nasal septum: Secondary | ICD-10-CM | POA: Insufficient documentation

## 2016-09-21 DIAGNOSIS — J343 Hypertrophy of nasal turbinates: Secondary | ICD-10-CM | POA: Diagnosis not present

## 2016-09-21 DIAGNOSIS — H6121 Impacted cerumen, right ear: Secondary | ICD-10-CM | POA: Diagnosis not present

## 2016-09-21 DIAGNOSIS — Z87891 Personal history of nicotine dependence: Secondary | ICD-10-CM | POA: Diagnosis not present

## 2016-09-21 DIAGNOSIS — Z7289 Other problems related to lifestyle: Secondary | ICD-10-CM | POA: Diagnosis not present

## 2016-11-03 DIAGNOSIS — Z23 Encounter for immunization: Secondary | ICD-10-CM | POA: Diagnosis not present

## 2016-11-23 ENCOUNTER — Ambulatory Visit: Payer: Medicare Other | Admitting: Podiatry

## 2016-12-07 ENCOUNTER — Other Ambulatory Visit: Payer: Self-pay | Admitting: *Deleted

## 2016-12-07 DIAGNOSIS — D473 Essential (hemorrhagic) thrombocythemia: Secondary | ICD-10-CM

## 2016-12-07 MED ORDER — HYDROXYUREA 500 MG PO CAPS: ORAL_CAPSULE | ORAL | 1 refills | Status: DC

## 2016-12-16 ENCOUNTER — Other Ambulatory Visit (HOSPITAL_BASED_OUTPATIENT_CLINIC_OR_DEPARTMENT_OTHER): Payer: Medicare Other

## 2016-12-16 DIAGNOSIS — D473 Essential (hemorrhagic) thrombocythemia: Secondary | ICD-10-CM

## 2016-12-16 LAB — CBC WITH DIFFERENTIAL/PLATELET
BASO%: 0.2 % (ref 0.0–2.0)
Basophils Absolute: 0 10*3/uL (ref 0.0–0.1)
EOS%: 1.1 % (ref 0.0–7.0)
Eosinophils Absolute: 0.1 10*3/uL (ref 0.0–0.5)
HEMATOCRIT: 38.6 % (ref 34.8–46.6)
HEMOGLOBIN: 12.4 g/dL (ref 11.6–15.9)
LYMPH#: 0.6 10*3/uL — AB (ref 0.9–3.3)
LYMPH%: 10.3 % — ABNORMAL LOW (ref 14.0–49.7)
MCH: 32.7 pg (ref 25.1–34.0)
MCHC: 32.1 g/dL (ref 31.5–36.0)
MCV: 101.8 fL — ABNORMAL HIGH (ref 79.5–101.0)
MONO#: 0.3 10*3/uL (ref 0.1–0.9)
MONO%: 5.4 % (ref 0.0–14.0)
NEUT%: 83 % — AB (ref 38.4–76.8)
NEUTROS ABS: 4.6 10*3/uL (ref 1.5–6.5)
PLATELETS: 271 10*3/uL (ref 145–400)
RBC: 3.79 10*6/uL (ref 3.70–5.45)
RDW: 15.9 % — AB (ref 11.2–14.5)
WBC: 5.5 10*3/uL (ref 3.9–10.3)

## 2016-12-23 DIAGNOSIS — H401133 Primary open-angle glaucoma, bilateral, severe stage: Secondary | ICD-10-CM | POA: Diagnosis not present

## 2016-12-23 DIAGNOSIS — H26493 Other secondary cataract, bilateral: Secondary | ICD-10-CM | POA: Diagnosis not present

## 2016-12-27 ENCOUNTER — Ambulatory Visit (INDEPENDENT_AMBULATORY_CARE_PROVIDER_SITE_OTHER): Payer: Medicare Other | Admitting: Podiatry

## 2016-12-27 ENCOUNTER — Encounter: Payer: Self-pay | Admitting: Podiatry

## 2016-12-27 DIAGNOSIS — M79609 Pain in unspecified limb: Secondary | ICD-10-CM

## 2016-12-27 DIAGNOSIS — B351 Tinea unguium: Secondary | ICD-10-CM

## 2016-12-27 NOTE — Progress Notes (Signed)
   Subjective:    Patient ID: Chelsea Garcia, female    DOB: November 10, 1933, 83 y.o.   MRN: [id number]  HPI  Patient presents here today for B/L toenail trim,right ball of foot pain. This patient presents to the office with long thick painful nails both feet.  Her right big toenail is especially thick and painful due to trauma.  She has pain walking and wearing her shoes.  No history of diabetes.  Review of Systems  All other systems reviewed and are negative.      Objective:   Physical Exam GENERAL APPEARANCE: Alert, conversant. Appropriately groomed. No acute distress.  VASCULAR: Pedal pulses palpable at  [facility] and PT bilateral.  Capillary refill time is immediate to all digits,  Normal temperature gradient.  Digital hair growth is present bilateral  NEUROLOGIC: sensation is normal to 5.07 monofilament at 5/5 sites bilateral.  Light touch is intact bilateral, Muscle strength normal.  MUSCULOSKELETAL: acceptable muscle strength, tone and stability bilateral.  Intrinsic muscluature intact bilateral.  Rectus appearance of foot and digits noted bilateral.   DERMATOLOGIC: skin color, texture, and turgor are within normal limits.  No preulcerative lesions or ulcers  are seen, no interdigital maceration noted.  No open lesions present.  . No drainage noted.  NAILS  Thick disfigured discolored nails both feet.         Assessment & Plan:  Onychomycosis  B/L  Debridement and grinding of long painful nails. RTC4  months   Gardiner Barefoot DPM

## 2017-01-12 DIAGNOSIS — H26492 Other secondary cataract, left eye: Secondary | ICD-10-CM | POA: Diagnosis not present

## 2017-01-26 DIAGNOSIS — H26491 Other secondary cataract, right eye: Secondary | ICD-10-CM | POA: Diagnosis not present

## 2017-02-15 DIAGNOSIS — D473 Essential (hemorrhagic) thrombocythemia: Secondary | ICD-10-CM | POA: Diagnosis not present

## 2017-02-15 DIAGNOSIS — R5383 Other fatigue: Secondary | ICD-10-CM | POA: Diagnosis not present

## 2017-03-17 ENCOUNTER — Ambulatory Visit: Payer: Medicare Other | Admitting: Hematology

## 2017-03-17 ENCOUNTER — Other Ambulatory Visit: Payer: Medicare Other

## 2017-03-20 DIAGNOSIS — Z79899 Other long term (current) drug therapy: Secondary | ICD-10-CM | POA: Diagnosis not present

## 2017-03-20 DIAGNOSIS — Z7982 Long term (current) use of aspirin: Secondary | ICD-10-CM | POA: Insufficient documentation

## 2017-03-20 DIAGNOSIS — Z936 Other artificial openings of urinary tract status: Secondary | ICD-10-CM | POA: Insufficient documentation

## 2017-03-20 DIAGNOSIS — Z87891 Personal history of nicotine dependence: Secondary | ICD-10-CM | POA: Insufficient documentation

## 2017-03-20 DIAGNOSIS — K5730 Diverticulosis of large intestine without perforation or abscess without bleeding: Secondary | ICD-10-CM | POA: Diagnosis not present

## 2017-03-20 DIAGNOSIS — R509 Fever, unspecified: Secondary | ICD-10-CM | POA: Diagnosis not present

## 2017-03-20 DIAGNOSIS — R05 Cough: Secondary | ICD-10-CM | POA: Diagnosis not present

## 2017-03-20 DIAGNOSIS — R1032 Left lower quadrant pain: Secondary | ICD-10-CM | POA: Insufficient documentation

## 2017-03-21 ENCOUNTER — Emergency Department (HOSPITAL_COMMUNITY): Payer: Medicare Other

## 2017-03-21 ENCOUNTER — Emergency Department (HOSPITAL_COMMUNITY)
Admission: EM | Admit: 2017-03-21 | Discharge: 2017-03-21 | Disposition: A | Discharge status: Home / Self Care | Payer: Medicare Other | Attending: Emergency Medicine | Admitting: Emergency Medicine

## 2017-03-21 ENCOUNTER — Encounter (HOSPITAL_COMMUNITY): Payer: Self-pay

## 2017-03-21 DIAGNOSIS — K5730 Diverticulosis of large intestine without perforation or abscess without bleeding: Secondary | ICD-10-CM | POA: Diagnosis not present

## 2017-03-21 DIAGNOSIS — R1032 Left lower quadrant pain: Secondary | ICD-10-CM | POA: Diagnosis not present

## 2017-03-21 LAB — COMPREHENSIVE METABOLIC PANEL
ALT: 13 U/L — AB (ref 14–54)
AST: 16 U/L (ref 15–41)
Albumin: 4.2 g/dL (ref 3.5–5.0)
Alkaline Phosphatase: 55 U/L (ref 38–126)
Anion gap: 9 (ref 5–15)
BUN: 24 mg/dL — AB (ref 6–20)
CO2: 21 mmol/L — AB (ref 22–32)
CREATININE: 0.78 mg/dL (ref 0.44–1.00)
Calcium: 9.7 mg/dL (ref 8.9–10.3)
Chloride: 108 mmol/L (ref 101–111)
GFR calc Af Amer: >60 mL/min (ref >60)
Glucose, Bld: 112 mg/dL — ABNORMAL HIGH (ref 65–99)
Potassium: 4 mmol/L (ref 3.5–5.1)
SODIUM: 138 mmol/L (ref 135–145)
Total Bilirubin: 0.8 mg/dL (ref 0.3–1.2)
Total Protein: 6.8 g/dL (ref 6.5–8.1)

## 2017-03-21 LAB — URINALYSIS, ROUTINE W REFLEX MICROSCOPIC
Bilirubin Urine: NEGATIVE
Glucose, UA: NEGATIVE mg/dL
Hgb urine dipstick: NEGATIVE
Ketones, ur: NEGATIVE mg/dL
Nitrite: NEGATIVE
Protein, ur: NEGATIVE mg/dL
SPECIFIC GRAVITY, URINE: 1.018 (ref 1.005–1.030)
pH: 5 (ref 5.0–8.0)

## 2017-03-21 LAB — LIPASE, BLOOD: LIPASE: 39 U/L (ref 11–51)

## 2017-03-21 LAB — CBC
HCT: 35.6 % — ABNORMAL LOW (ref 36.0–46.0)
Hemoglobin: 11.4 g/dL — ABNORMAL LOW (ref 12.0–15.0)
MCH: 32.6 pg (ref 26.0–34.0)
MCHC: 32 g/dL (ref 30.0–36.0)
MCV: 101.7 fL — AB (ref 78.0–100.0)
PLATELETS: 249 10*3/uL (ref 150–400)
RBC: 3.5 MIL/uL — ABNORMAL LOW (ref 3.87–5.11)
RDW: 15.9 % — AB (ref 11.5–15.5)
WBC: 4.6 10*3/uL (ref 4.0–10.5)

## 2017-03-21 MED ORDER — IOPAMIDOL (ISOVUE-300) INJECTION 61%
INTRAVENOUS | Status: AC
  Filled 2017-03-21: qty 100

## 2017-03-21 MED ORDER — PREDNISONE 20 MG PO TABS
60.0000 mg | ORAL_TABLET | ORAL | Status: AC
  Administered 2017-03-21: 60 mg via ORAL
  Filled 2017-03-21 (×2): qty 3

## 2017-03-21 MED ORDER — IOPAMIDOL (ISOVUE-300) INJECTION 61%
100.0000 mL | Freq: Once | INTRAVENOUS | Status: AC | PRN
  Administered 2017-03-21: 80 mL via INTRAVENOUS

## 2017-03-21 MED ORDER — PREDNISONE 20 MG PO TABS: 40.0000 mg | ORAL_TABLET | Freq: Every day | ORAL | 0 refills | Status: DC

## 2017-03-21 MED ORDER — SODIUM CHLORIDE 0.9 % IJ SOLN
INTRAMUSCULAR | Status: AC
  Filled 2017-03-21: qty 50

## 2017-03-21 NOTE — ED Triage Notes (Signed)
Pt presents with c/o left lower abdominal pain that started around 10:30 pm last night (Monday night). Pt reports the pain intensified at this time but initially began on Friday of last week. Pt reports she does have an appointment later today but the pain became too great to wait. Pt denies any N/V.

## 2017-03-21 NOTE — ED Notes (Signed)
Pt returned from CT °

## 2017-03-21 NOTE — ED Notes (Signed)
Patient transported to CT 

## 2017-03-21 NOTE — ED Provider Notes (Signed)
[facility] DEPT Provider Note   CSN: [id number] Arrival date & time: 03/20/17  2345     History   Chief Complaint Chief Complaint  Patient presents with  . Abdominal Pain    HPI Chelsea Garcia is a 84 y.o. female.  HPI Patient with multiple medical issues, including recent placement of nephrostomy tube on the left side, ureteral stent on the right side presents with concern of fever. Patient is here with her son who assists with the HPI. She notes that she was hospitalized 1 week ago for sepsis, during that hospitalization had after mentioned procedures. She is no longer on antibiotics, and was recovering generally well aside from sinus congestion, cough, fatigue. Yesterday, the patient's son noticed a fever, and on wound evaluation without redness, possible cutaneous changes. After speaking with urologist on call, she was sent here for evaluation. Patient has mild discomfort in the left flank, but no other abdominal or flank pain. No confusion, disorientation. She is taking all other medication as directed.  Past Medical History:  Diagnosis Date  . Colon polyps    Benign. 2 colonoscopies in past. Due now again.  . Endometriosis    required TAH when young  . Essential tremor    since 40's  . Glaucoma    started in 40's  . Low back pain    since 40's  . Migraine    started in 50's  . Osteopenia    2014  . Seasonal allergies    to Dust, mold and pollen.  . Thrombocytosis ([place]) September 2015  . Tick bites    she likes out door activities, 2 this year (2015)    Patient Active Problem List   Diagnosis Date Noted  . Essential thrombocytosis ([place]) 10/01/2013    Past Surgical History:  Procedure Laterality Date  . CATARACT EXTRACTION Left 02/07/2013  . TOTAL ABDOMINAL HYSTERECTOMY  1970's  . TUBAL LIGATION      OB History    No data available       Home Medications    Prior to Admission medications   Medication Sig Start  Date End Date Taking? Authorizing Provider  aspirin 81 MG tablet Take 81 mg by mouth daily.   Yes [provider]  azelastine (ASTELIN) 0.1 % nasal spray Place 1 spray into the nose 2 (two) times daily as needed for allergies. 09/21/16  Yes [provider]  bacitracin ophthalmic ointment Apply 1 application to eye at bedtime. 10/01/13  Yes [provider]  bimatoprost (LUMIGAN) 0.03 % ophthalmic solution Place 1 drop into both eyes at bedtime. 10/01/13  Yes [provider]  dorzolamide-timolol (COSOPT) 22.3-6.8 MG/ML ophthalmic solution Place 1 drop into both eyes 2 (two) times daily. 10/01/13  Yes [provider]  hydroxyurea (HYDREA) 500 MG capsule TAKE ONE CAPSULE BY MOUTH DAILY WITH FOOD TO MINIMIZE GI SIDE EFFECTS 12/07/16  Yes Truitt Merle, MD  naproxen sodium (ALEVE) 220 MG tablet Take 220-440 mg by mouth daily as needed (pain.).   Yes [provider]  SUMAtriptan (IMITREX) 100 MG tablet Take 100 mg by mouth every 2 (two) hours as needed for migraine or headache. May repeat in 2 hours if headache persists or recurs. 10/01/13  Yes [provider]  Probiotic Product (PROBIOTIC DAILY PO) Take 1 tablet by mouth daily.    [provider]    Family History Family History  Problem Relation Age of Onset  . Thyroid disease Mother   . Cancer  Father   . Cancer Maternal Aunt 76    Social History Social History   Tobacco Use  . Smoking status: Former Smoker    Packs/day: 0.25    Types: Cigarettes    Last attempt to quit: 07/01/1961    Years since quitting: 55.7  . Smokeless tobacco: Never Used  Substance Use Topics  . Alcohol use: No  . Drug use: No     Allergies   Bee pollen; Pollen extract; and Sulfamethoxazole   Review of Systems Review of Systems  Constitutional:       Per HPI, otherwise negative  HENT:       Per HPI, otherwise negative  Respiratory:       Per HPI, otherwise negative  Cardiovascular:        Per HPI, otherwise negative  Gastrointestinal: Negative for vomiting.  Endocrine:       Negative aside from HPI  Genitourinary:       Neg aside from HPI   Musculoskeletal:       Per HPI, otherwise negative  Skin: Positive for color change and wound.  Allergic/Immunologic: Negative for immunocompromised state.  Neurological: Negative for syncope.     Physical Exam Updated Vital Signs BP (!) 107/51   Pulse 65   Temp 98.1 F (36.7 C) (Oral)   Resp 18   SpO2 95%   Physical Exam  Constitutional: She is oriented to person, place, and time. She appears well-developed and well-nourished. No distress.  HENT:  Head: Normocephalic and atraumatic.  Eyes: Conjunctivae and EOM are normal.  Cardiovascular: Normal rate and regular rhythm.  Pulmonary/Chest: Effort normal and breath sounds normal. No stridor. No respiratory distress.  Abdominal: She exhibits no distension. There is no tenderness.  Musculoskeletal: She exhibits no edema.       Arms: Neurological: She is alert and oriented to person, place, and time. No cranial nerve deficit.  Skin: Skin is warm and dry.  Psychiatric: She has a normal mood and affect.  Nursing note and vitals reviewed.    ED Treatments / Results  Labs (all labs ordered are listed, but only abnormal results are displayed) Labs Reviewed  COMPREHENSIVE METABOLIC PANEL - Abnormal; Notable for the following components:      Result Value   CO2 21 (*)    Glucose, Bld 112 (*)    BUN 24 (*)    ALT 13 (*)    All other components within normal limits  CBC - Abnormal; Notable for the following components:   RBC 3.50 (*)    Hemoglobin 11.4 (*)    HCT 35.6 (*)    MCV 101.7 (*)    RDW 15.9 (*)    All other components within normal limits  URINALYSIS, ROUTINE W REFLEX MICROSCOPIC - Abnormal; Notable for the following components:   Leukocytes, UA TRACE (*)    Bacteria, UA RARE (*)    Squamous Epithelial / LPF 0-5 (*)    All other components within normal  limits  LIPASE, BLOOD    EKG  EKG Interpretation None       Radiology Ct Abdomen Pelvis W Contrast  Result Date: 03/21/2017 CLINICAL DATA:  Left lower quadrant abdominal pain since last night. Prior hysterectomy. EXAM: CT ABDOMEN AND PELVIS WITH CONTRAST TECHNIQUE: Multidetector CT imaging of the abdomen and pelvis was performed using the standard protocol following bolus administration of intravenous contrast. CONTRAST:  61mL ISOVUE-300 IOPAMIDOL (ISOVUE-300) INJECTION 61% COMPARISON:  None. FINDINGS: Lower chest: Mild cylindrical and varicoid bronchiectasis  in the lingula. Parenchymal bands in the right middle lobe and lingula. Minimal patchy tree-in-bud opacity at the anterior left lower lobe base compatible with mild nonspecific bronchiolitis. Medial left lower lobe 3 mm solid pulmonary nodule (series 4/image 6). Hepatobiliary: Normal liver size. No liver mass. Normal gallbladder with no radiopaque cholelithiasis. No biliary ductal dilatation. Pancreas: Normal, with no mass or duct dilation. Spleen: Moderate splenomegaly (craniocaudal splenic length 17.0 cm. No splenic mass. Adrenals/Urinary Tract: Normal adrenals. No hydronephrosis. No renal masses. Moderate dilated left extrarenal pelvis. Normal bladder. Stomach/Bowel: Small hiatal hernia. Otherwise normal nondistended stomach. Normal caliber small bowel with no small bowel wall thickening. Normal appendix. Moderate sigmoid diverticulosis, with no large bowel wall thickening or pericolonic fat stranding. Vascular/Lymphatic: Atherosclerotic nonaneurysmal abdominal aorta. Patent portal, splenic, hepatic and renal veins. No pathologically enlarged lymph nodes in the abdomen or pelvis. Reproductive: Status post hysterectomy, with no abnormal findings at the vaginal cuff. No adnexal mass. Other: No pneumoperitoneum, ascites or focal fluid collection. Musculoskeletal: Nonspecific small 1.5 cm central L4 vertebral lytic lesion (series 6/image 60).  Marked lumbar spondylosis. IMPRESSION: 1. Moderate sigmoid diverticulosis, with no evidence of acute diverticulitis. No evidence of bowel obstruction or acute bowel inflammation. 2. Moderate splenomegaly, nonspecific. No abdominopelvic adenopathy. Recommend clinical and laboratory evaluation to assess for the possibility of a lymphoproliferative disorder. 3. Nonspecific small L4 vertebral lytic lesion. Consider further evaluation with lumbar spine MRI without and with IV contrast or with bone scintigraphy study. 4. Mild bronchiectasis in the lingula. Mild nonspecific bronchiolitis at the left lung base. Left lower lobe 3 mm solid pulmonary nodule. No follow-up needed if patient is low-risk. Non-contrast chest CT can be considered in 12 months if patient is high-risk. This recommendation follows the consensus statement: Guidelines for Management of Incidental Pulmonary Nodules Detected on CT Images:From the Fleischner Society 2017; published online before print (10.1148/radiol.4034742595). 5. Small hiatal hernia. 6.  Aortic Atherosclerosis (ICD10-I70.0). Electronically Signed   By: Ilona Sorrel M.D.   On: 03/21/2017 09:26    Procedures Procedures (including critical care time)  Medications Ordered in ED Medications  iopamidol (ISOVUE-300) 61 % injection (not administered)  sodium chloride 0.9 % injection (not administered)  predniSONE (DELTASONE) tablet 60 mg (not administered)  iopamidol (ISOVUE-300) 61 % injection 100 mL (80 mLs Intravenous Contrast Given 03/21/17 0833)     Initial Impression / Assessment and Plan / ED Course  I have reviewed the triage vital signs and the nursing notes.  Pertinent labs & imaging results that were available during my care of the patient were reviewed by me and considered in my medical decision making (see chart for details).     10:26 AM On repeat exam the patient sitting upright, in no distress.  We discussed all findings including reassuring labs,  urinalysis, CT scan. I discussed the vertebral body lesion, need for outpatient follow-up with MRI, primary care. We discussed possibility for bronchiectasis, she states that she does have ongoing mild cough, though she attributes this to sinusitis. She remains afebrile, no hypoxia, there is low suspicion for occult pneumonia. Given the patient's inflammatory bowel disease history, abnormalities in the lungs on CT scan, patient will start a short course of steroids. With reassuring CT scan, no evidence for diverticulitis, infection, urinary tract infection, and with a generally reassuring physical exam, the patient is appropriate for close outpatient follow-up.  Final Clinical Impressions(s) / ED Diagnoses  Abdominal pain, left lower quadrant   Carmin Muskrat, MD 03/21/17 1027

## 2017-03-21 NOTE — ED Notes (Signed)
Pt ambulatory to restroom without complication. Pt provided warm blanket.

## 2017-03-21 NOTE — Discharge Instructions (Signed)
As discussed, your evaluation today has been largely reassuring.  But, it is important that you monitor your condition carefully, and do not hesitate to return to the ED if you develop new, or concerning changes in your condition. ? ?Otherwise, please follow-up with your physician for appropriate ongoing care. ? ?

## 2017-03-24 DIAGNOSIS — R197 Diarrhea, unspecified: Secondary | ICD-10-CM | POA: Diagnosis not present

## 2017-03-24 DIAGNOSIS — R161 Splenomegaly, not elsewhere classified: Secondary | ICD-10-CM | POA: Diagnosis not present

## 2017-03-24 DIAGNOSIS — K5790 Diverticulosis of intestine, part unspecified, without perforation or abscess without bleeding: Secondary | ICD-10-CM | POA: Diagnosis not present

## 2017-03-24 DIAGNOSIS — R109 Unspecified abdominal pain: Secondary | ICD-10-CM | POA: Diagnosis not present

## 2017-03-27 ENCOUNTER — Ambulatory Visit: Payer: Medicare Other | Admitting: Hematology

## 2017-03-27 ENCOUNTER — Other Ambulatory Visit: Payer: Medicare Other

## 2017-03-28 ENCOUNTER — Telehealth: Payer: Self-pay | Admitting: Hematology

## 2017-03-28 ENCOUNTER — Telehealth: Payer: Self-pay

## 2017-03-28 NOTE — Telephone Encounter (Signed)
Appointments rescheduled / Spoke with patient per 3/15 sch msg

## 2017-03-28 NOTE — Telephone Encounter (Signed)
Pt of Dr. Burr Medico. Called to request medical diagnosis based on recent appt with hematologist. Pt able to explain condition to PCP, but wanted to know the correct medical term. Found diagnosis in recent OV note, "Essential thrombocytosis." Spelled it out for the pt, who then performed readback. Pt verbalized thanks for the information.

## 2017-03-29 DIAGNOSIS — M899 Disorder of bone, unspecified: Secondary | ICD-10-CM | POA: Diagnosis not present

## 2017-03-29 DIAGNOSIS — R197 Diarrhea, unspecified: Secondary | ICD-10-CM | POA: Diagnosis not present

## 2017-03-29 DIAGNOSIS — D473 Essential (hemorrhagic) thrombocythemia: Secondary | ICD-10-CM | POA: Diagnosis not present

## 2017-03-30 ENCOUNTER — Other Ambulatory Visit: Payer: Self-pay | Admitting: Family Medicine

## 2017-03-31 ENCOUNTER — Other Ambulatory Visit: Payer: Self-pay | Admitting: Family Medicine

## 2017-03-31 DIAGNOSIS — M899 Disorder of bone, unspecified: Secondary | ICD-10-CM

## 2017-04-03 ENCOUNTER — Ambulatory Visit
Admission: RE | Admit: 2017-04-03 | Discharge: 2017-04-03 | Disposition: A | Discharge status: Home / Self Care | Payer: Medicare Other | Source: Ambulatory Visit | Attending: Family Medicine | Admitting: Family Medicine

## 2017-04-03 DIAGNOSIS — M899 Disorder of bone, unspecified: Secondary | ICD-10-CM

## 2017-04-03 DIAGNOSIS — M48061 Spinal stenosis, lumbar region without neurogenic claudication: Secondary | ICD-10-CM | POA: Diagnosis not present

## 2017-04-04 NOTE — Progress Notes (Signed)
[facility]EMAT[facility]Y FOLLOW UP NOTE DATE OF VISIT: 04/05/2017   Patient Care Team: Lujean Amel, MD as PCP - General (Family Medicine) Bernadene Bell, MD (Inactive) as Consulting Physician (Hematology)  DIAGNOSIS: Essential Thrombocytosis, diagnosed in Sep 2015  HISTORY OF PRESENTING ILLNESS:  Chelsea Garcia 84 y.o. female  from [place] who was being referred here for an elevated platelet count and seen on 10/01/2013. She recently had blood testing done by her primary care Carlos Levering PA. CBC collected on 09/12/2013 showed a white count of 10.1, hemoglobin 17.4 g, hematocrit 54.7, MCV 87.5, and platelet count of 515. A repeat CBC was done on 09/18/2013. It showed a white count 11.5, hemoglobin 16.5, hematocrit 51, MCV 86 and platelet count was 546. Patient does not have any iron deficiency. She does not have any stressors such as recent surgery, inflammation, infection which can lead to reactive thrombocytosis. Her metabolic panel showed normal glucose, normal kidney function, electrolytes and liver function panel. A serum ferritin was 112. B12 was 521 normal. TSH 7.65 mildly elevated consistent with hypothyroidism. Free T4 0.86 normal. Vitamin D level was 37.2. Tissue transglutaminase IgA was less than 2.   CURRENT THERAPY: Hydroxyuria 500mg  daily started 11/27/2013, and Aspirin 81mg  daily   INTERVAL HISTORY:  Chelsea Garcia returns for follow-up. She presents to the clinic today by herself. Pt went to the ED on 03/21/17 for LLQ abdominal pain onset 3 weeks prior to ED visit. She was discharged with a short course of steriods She reports to only have pain once a week now that she takes Tylenol as needed for. She has an upcoming appointment with a gastroenterologist, Dr. Michail Sermon.   She is compliant with 500mg  Hydrea and 81mg  a ASA daily.  On review of systems, pt denies any other complaints at this time. Pertinent positives are listed and detailed within the above  HPI.   Patient Active Problem List   Diagnosis Date Noted  . Essential thrombocytosis ([place]) 10/01/2013     MEDICAL HISTORY:  Past Medical History:  Diagnosis Date  . Colon polyps    Benign. 2 colonoscopies in past. Due now again.  . Endometriosis    required TAH when young  . Essential tremor    since 40's  . Glaucoma    started in 40's  . Low back pain    since 40's  . Migraine    started in 50's  . Osteopenia    2014  . Seasonal allergies    to Dust, mold and pollen.  . Thrombocytosis ([place]) September 2015  . Tick bites    she likes out door activities, 2 this year (2015)    SURGICAL HISTORY: Past Surgical History:  Procedure Laterality Date  . CATARACT EXTRACTION Left 02/07/2013  . TOTAL ABDOMINAL HYSTERECTOMY  1970's  . TUBAL LIGATION      SOCIAL HISTORY: Social History   Socioeconomic History  . Marital status: Married    Spouse name: Not on file  . Number of children: Not on file  . Years of education: Not on file  . Highest education level: Not on file  Occupational History  . Not on file  Social Needs  . Financial resource strain: Not on file  . Food insecurity:    Worry: Not on file    Inability: Not on file  . Transportation needs:    Medical: Not on file    Non-medical: Not on file  Tobacco Use  . Smoking status: Former Smoker  Packs/day: 0.25    Types: Cigarettes    Last attempt to quit: 07/01/1961    Years since quitting: 55.8  . Smokeless tobacco: Never Used  Substance and Sexual Activity  . Alcohol use: No  . Drug use: No  . Sexual activity: Yes  Lifestyle  . Physical activity:    Days per week: Not on file    Minutes per session: Not on file  . Stress: Not on file  Relationships  . Social connections:    Talks on phone: Not on file    Gets together: Not on file    Attends religious service: Not on file    Active member of club or organization: Not on file    Attends meetings of clubs or organizations: Not on file     Relationship status: Not on file  . Intimate partner violence:    Fear of current or ex partner: Not on file    Emotionally abused: Not on file    Physically abused: Not on file    Forced sexual activity: Not on file  Other Topics Concern  . Not on file  Social History Narrative   Patient lives in [place]. Married. Came by herself. She has got 2 sons ages 54 and 34 and 2 grankids boy 73 and daughter 54. She is active, walks every day, semi-retired accountant.     FAMILY HISTORY: Family History  Problem Relation Age of Onset  . Thyroid disease Mother   . Cancer Father   . Cancer Maternal Aunt 80    ALLERGIES:  is allergic to bee pollen; pollen extract; and sulfamethoxazole.  MEDICATIONS:  Current Outpatient Medications  Medication Sig Dispense Refill  . aspirin 81 MG tablet Take 81 mg by mouth daily.    Marland Kitchen azelastine (ASTELIN) 0.1 % nasal spray Place 1 spray into the nose 2 (two) times daily as needed for allergies.    . bacitracin ophthalmic ointment Apply 1 application to eye at bedtime.    . bimatoprost (LUMIGAN) 0.03 % ophthalmic solution Place 1 drop into both eyes at bedtime.    . dorzolamide-timolol (COSOPT) 22.3-6.8 MG/ML ophthalmic solution Place 1 drop into both eyes 2 (two) times daily.    . hydroxyurea (HYDREA) 500 MG capsule TAKE ONE CAPSULE BY MOUTH DAILY WITH FOOD TO MINIMIZE GI SIDE EFFECTS 90 capsule 1  . naproxen sodium (ALEVE) 220 MG tablet Take 220-440 mg by mouth daily as needed (pain.).    Marland Kitchen predniSONE (DELTASONE) 20 MG tablet Take 2 tablets (40 mg total) by mouth daily with breakfast. For the next four days 8 tablet 0  . Probiotic Product (PROBIOTIC DAILY PO) Take 1 tablet by mouth daily.    . SUMAtriptan (IMITREX) 100 MG tablet Take 100 mg by mouth every 2 (two) hours as needed for migraine or headache. May repeat in 2 hours if headache persists or recurs.     No current facility-administered medications for this visit.     REVIEW OF SYSTEMS:    Constitutional: Denies fevers, chills or abnormal night sweats Eyes: Denies blurriness of vision, double vision or watery eyes Ears, nose, mouth, throat, and face: Denies mucositis or sore throat Respiratory: Denies cough, dyspnea or wheezes, +seasonal allergies Cardiovascular: Denies palpitation, chest discomfort or lower extremity swelling Gastrointestinal:  Denies nausea, heartburn, no bowel complaints (+) intermittent left abdomin pain  Skin: Denies abnormal skin rashes Lymphatics: Denies new lymphadenopathy or easy bruising Neurological:Denies numbness, tingling or new weaknesses, low back pain Behavioral/Psych: Mood is stable,  no new changes  All other systems were reviewed with the patient and are negative.  PHYSICAL EXAMINATION: ECOG PERFORMANCE STATUS: 0  Vitals:   04/05/17 1307  BP: 120/68  Pulse: 68  Temp: (!) 97.5 F (36.4 C)  SpO2: 100%   Filed Weights   04/05/17 1307  Weight: 130 lb 6.4 oz (59.1 kg)    GENERAL:alert, no distress and comfortable SKIN: skin color, texture, turgor are normal, no rashes or significant lesions EYES: normal, conjunctiva are pink and non-injected, sclera clear OROPHARYNX:no exudate, no erythema and lips, buccal mucosa, and tongue normal  NECK: supple, thyroid normal size, non-tender, without nodularity LYMPH:  no palpable lymphadenopathy in the cervical, axillary or inguinal LUNGS: clear to auscultation and percussion with normal breathing effort HEART: regular rate & rhythm and no murmurs and no lower extremity edema ABDOMEN:abdomen soft, non-tender and normal bowel sounds, no HSM (+) moderate splenomegaly about 4 cm below ribcage  Musculoskeletal:no cyanosis of digits and no clubbing  PSYCH: alert & oriented x 3 with fluent speech NEURO: no focal motor/sensory deficits  LABORATORY DATA:  I have reviewed the data as listed  CBC Latest Ref Rng & Units 04/05/2017 03/21/2017 12/16/2016  WBC 3.9 - 10.3 K/uL 5.2 4.6 5.5  Hemoglobin  11.6 - 15.9 g/dL 12.5 11.4(L) 12.4  Hematocrit 34.8 - 46.6 % 37.8 35.6(L) 38.6  Platelets 145 - 400 K/uL 237 249 271    CMP Latest Ref Rng & Units 04/05/2017 03/21/2017 09/16/2016  Glucose 70 - 140 mg/dL 109 112(H) 107  BUN 7 - 26 mg/dL 20 24(H) 18.3  Creatinine 0.60 - 1.10 mg/dL 0.83 0.78 0.8  Sodium 136 - 145 mmol/L 141 138 142  Potassium 3.5 - 5.1 mmol/L 4.0 4.0 4.0  Chloride 98 - 109 mmol/L 107 108 -  CO2 22 - 29 mmol/L 26 21(L) 23  Calcium 8.4 - 10.4 mg/dL 9.8 9.7 9.8  Total Protein 6.4 - 8.3 g/dL 7.1 6.8 7.1  Total Bilirubin 0.2 - 1.2 mg/dL 0.6 0.8 0.79  Alkaline Phos 40 - 150 U/L 54 55 49  AST 5 - 34 U/L 14 16 15   ALT 0 - 55 U/L 12 13(L) 9   RADIOGRAPHIC STUDIES: No new scans since last visit.  ASSESSMENT & PLAN:  Chelsea Garcia is a pleasant 84 y.o. female in good health who presented with mild leukocytosis and thrombocytosis with plt count around 500-650K. JAK2 mutation was positive, BCR-ABL was negative. She has essential thrombocytosis. No history of thrombosis.  1. Essential thrombocytosis, JAK2(+) -due to her advanced age, she is at high risk for thrombosis secondary to ET, and hydrea is recommend.  -We previously discussed the risk of thrombosis. She knows what to watch. -We previously discussed the goal of therapy is palliative, and prevent thrombosis. Unfortunately it is not curable, and there is a small risk in the involved to acute leukemia  and myelofibrosis down the road. -She has been taking hydrea for 3.5 years now. I again discussed the side effects and risk of hydrea and she understands to keep taking ASA  -continue aspirin 81 mg once daily -She has changed her PCP with [facility] physicians  -She is clinically doing very well, tolerating 500mg  hydroxyurea without any noticeable side effects. Her plt are normal today at 237K and have previously been normal and well controlled.  -F/iu in 6 months, lab every 3 months   2. Intermittent Left-sided Abdominal pain -Pt went  to the ED on 03/21/17 for LLQ abdominal pain onset 3 weeks  prior to ED visit. She was discharged with a short course of steriods She reports to only have pain once a week now that she takes Tylenol as needed for. She has an upcoming appointment with a gastroenterologist, Dr. Michail Sermon.  -I discussed her CT AP W Contrast from 03/21/17 findings with her today. She has diverticulosis and moderate splenomegaly. I suspect her diverticulosis was likely the cause of her pain, certainly splenomegaly can cause abdominal pain also, but her pain is not very typical.   PLAN: -Repeat CBC every 3  months. I'll see her back in 6 months. -continue Hydrea 500 mg daily  All questions were answered. The patient knows to call the clinic with any problems, questions or concerns.  I spent 15 minutes counseling the patient face to face. The total time spent in the appointment was 20 minutes and more than 50% was on counseling.  This document serves as a record of services personally performed by Truitt Merle, MD. It was created on her behalf by Theresia Bough, a trained medical scribe. The creation of this record is based on the scribe's personal observations and the provider's statements to them.   I have reviewed the above documentation for accuracy and completeness, and I agree with the above.   Truitt Merle  04/05/2017 4:03 PM

## 2017-04-05 ENCOUNTER — Telehealth: Payer: Self-pay | Admitting: Hematology

## 2017-04-05 ENCOUNTER — Inpatient Hospital Stay: Payer: Medicare Other | Attending: Hematology

## 2017-04-05 ENCOUNTER — Inpatient Hospital Stay (HOSPITAL_BASED_OUTPATIENT_CLINIC_OR_DEPARTMENT_OTHER): Payer: Medicare Other | Admitting: Hematology

## 2017-04-05 ENCOUNTER — Encounter: Payer: Self-pay | Admitting: Hematology

## 2017-04-05 VITALS — BP 120/68 | HR 68 | Temp 97.5°F | Ht 62.0 in | Wt 130.4 lb

## 2017-04-05 DIAGNOSIS — D473 Essential (hemorrhagic) thrombocythemia: Secondary | ICD-10-CM

## 2017-04-05 DIAGNOSIS — R109 Unspecified abdominal pain: Secondary | ICD-10-CM | POA: Insufficient documentation

## 2017-04-05 LAB — COMPREHENSIVE METABOLIC PANEL
ALK PHOS: 54 U/L (ref 40–150)
ALT: 12 U/L (ref 0–55)
ANION GAP: 8 (ref 3–11)
AST: 14 U/L (ref 5–34)
Albumin: 4.2 g/dL (ref 3.5–5.0)
BILIRUBIN TOTAL: 0.6 mg/dL (ref 0.2–1.2)
BUN: 20 mg/dL (ref 7–26)
CO2: 26 mmol/L (ref 22–29)
Calcium: 9.8 mg/dL (ref 8.4–10.4)
Chloride: 107 mmol/L (ref 98–109)
Creatinine, Ser: 0.83 mg/dL (ref 0.60–1.10)
GFR calc non Af Amer: >60 mL/min (ref >60)
Glucose, Bld: 109 mg/dL (ref 70–140)
Potassium: 4 mmol/L (ref 3.5–5.1)
Sodium: 141 mmol/L (ref 136–145)
TOTAL PROTEIN: 7.1 g/dL (ref 6.4–8.3)

## 2017-04-05 LAB — CBC WITH DIFFERENTIAL/PLATELET
Basophils Absolute: 0 10*3/uL (ref 0.0–0.1)
Basophils Relative: 1 %
Eosinophils Absolute: 0.1 10*3/uL (ref 0.0–0.5)
Eosinophils Relative: 1 %
HEMATOCRIT: 37.8 % (ref 34.8–46.6)
HEMOGLOBIN: 12.5 g/dL (ref 11.6–15.9)
LYMPHS ABS: 0.5 10*3/uL — AB (ref 0.9–3.3)
Lymphocytes Relative: 10 %
MCH: 32.8 pg (ref 25.1–34.0)
MCHC: 33 g/dL (ref 31.5–36.0)
MCV: 99.2 fL (ref 79.5–101.0)
MONO ABS: 0.2 10*3/uL (ref 0.1–0.9)
MONOS PCT: 4 %
NEUTROS ABS: 4.4 10*3/uL (ref 1.5–6.5)
NEUTROS PCT: 84 %
Platelets: 237 10*3/uL (ref 145–400)
RBC: 3.81 MIL/uL (ref 3.70–5.45)
RDW: 16.9 % — AB (ref 11.2–14.5)
WBC: 5.2 10*3/uL (ref 3.9–10.3)

## 2017-04-05 MED ORDER — HYDROXYUREA 500 MG PO CAPS: ORAL_CAPSULE | ORAL | 1 refills | Status: DC

## 2017-04-05 NOTE — Telephone Encounter (Signed)
Appointments scheduled AVS/Calendar printed per 3/27 los °

## 2017-04-11 DIAGNOSIS — R1012 Left upper quadrant pain: Secondary | ICD-10-CM | POA: Diagnosis not present

## 2017-04-11 DIAGNOSIS — A045 Campylobacter enteritis: Secondary | ICD-10-CM | POA: Diagnosis not present

## 2017-04-18 DIAGNOSIS — H401133 Primary open-angle glaucoma, bilateral, severe stage: Secondary | ICD-10-CM | POA: Diagnosis not present

## 2017-04-25 ENCOUNTER — Ambulatory Visit: Payer: Medicare Other | Admitting: Podiatry

## 2017-05-31 DIAGNOSIS — S0101XA Laceration without foreign body of scalp, initial encounter: Secondary | ICD-10-CM | POA: Diagnosis not present

## 2017-05-31 DIAGNOSIS — W19XXXA Unspecified fall, initial encounter: Secondary | ICD-10-CM | POA: Diagnosis not present

## 2017-06-21 ENCOUNTER — Other Ambulatory Visit: Payer: Self-pay

## 2017-06-21 DIAGNOSIS — D473 Essential (hemorrhagic) thrombocythemia: Secondary | ICD-10-CM

## 2017-06-21 DIAGNOSIS — E038 Other specified hypothyroidism: Secondary | ICD-10-CM | POA: Diagnosis not present

## 2017-06-21 MED ORDER — HYDROXYUREA 500 MG PO CAPS: ORAL_CAPSULE | ORAL | 1 refills | Status: DC

## 2017-06-21 NOTE — Telephone Encounter (Signed)
Refilled script for patient sent into [facility] on [address]. Called patient back to left her know has been done.

## 2017-07-03 ENCOUNTER — Other Ambulatory Visit: Payer: Self-pay | Admitting: Hematology

## 2017-07-03 DIAGNOSIS — D473 Essential (hemorrhagic) thrombocythemia: Secondary | ICD-10-CM

## 2017-07-04 DIAGNOSIS — W57XXXA Bitten or stung by nonvenomous insect and other nonvenomous arthropods, initial encounter: Secondary | ICD-10-CM | POA: Diagnosis not present

## 2017-07-04 DIAGNOSIS — S0086XA Insect bite (nonvenomous) of other part of head, initial encounter: Secondary | ICD-10-CM | POA: Diagnosis not present

## 2017-07-06 ENCOUNTER — Inpatient Hospital Stay: Payer: Medicare Other | Attending: Hematology

## 2017-07-06 DIAGNOSIS — D473 Essential (hemorrhagic) thrombocythemia: Secondary | ICD-10-CM | POA: Diagnosis not present

## 2017-07-06 LAB — CBC WITH DIFFERENTIAL/PLATELET
BASOS ABS: 0 10*3/uL (ref 0.0–0.1)
Basophils Relative: 0 %
Eosinophils Absolute: 0.1 10*3/uL (ref 0.0–0.5)
Eosinophils Relative: 1 %
HEMATOCRIT: 37.7 % (ref 34.8–46.6)
HEMOGLOBIN: 12.2 g/dL (ref 11.6–15.9)
LYMPHS ABS: 0.8 10*3/uL — AB (ref 0.9–3.3)
LYMPHS PCT: 15 %
MCH: 32.3 pg (ref 25.1–34.0)
MCHC: 32.4 g/dL (ref 31.5–36.0)
MCV: 99.7 fL (ref 79.5–101.0)
Monocytes Absolute: 0.2 10*3/uL (ref 0.1–0.9)
Monocytes Relative: 5 %
NEUTROS ABS: 4 10*3/uL (ref 1.5–6.5)
NEUTROS PCT: 79 %
Platelets: 262 10*3/uL (ref 145–400)
RBC: 3.78 MIL/uL (ref 3.70–5.45)
RDW: 15.9 % — ABNORMAL HIGH (ref 11.2–14.5)
WBC: 5.1 10*3/uL (ref 3.9–10.3)

## 2017-07-07 ENCOUNTER — Telehealth: Payer: Self-pay

## 2017-07-07 NOTE — Telephone Encounter (Signed)
-----   Message from Truitt Merle, MD sent at 07/06/2017  5:12 PM EDT ----- Please let pt know that her CBC is normal, continue current dose hydrea, no concerns, thanks  Truitt Merle  07/06/2017

## 2017-07-07 NOTE — Telephone Encounter (Signed)
Left voice message per Dr. Burr Medico notifying patient the her CBC was normal, continue current dose of hydrea, no concerns at presents.  Encouraged patient to call back if she has questions.

## 2017-07-19 DIAGNOSIS — M25561 Pain in right knee: Secondary | ICD-10-CM | POA: Diagnosis not present

## 2017-08-25 ENCOUNTER — Telehealth: Payer: Self-pay | Admitting: Hematology

## 2017-08-25 NOTE — Telephone Encounter (Signed)
Left message for patient regarding upcoming sept appt updates.

## 2017-09-01 DIAGNOSIS — H401133 Primary open-angle glaucoma, bilateral, severe stage: Secondary | ICD-10-CM | POA: Diagnosis not present

## 2017-09-06 ENCOUNTER — Ambulatory Visit (INDEPENDENT_AMBULATORY_CARE_PROVIDER_SITE_OTHER): Payer: Medicare Other | Admitting: Podiatry

## 2017-09-06 ENCOUNTER — Encounter: Payer: Self-pay | Admitting: Podiatry

## 2017-09-06 DIAGNOSIS — M79609 Pain in unspecified limb: Secondary | ICD-10-CM | POA: Diagnosis not present

## 2017-09-06 DIAGNOSIS — B351 Tinea unguium: Secondary | ICD-10-CM | POA: Diagnosis not present

## 2017-09-06 NOTE — Progress Notes (Signed)
   Subjective:    Patient ID: Chelsea Garcia, female    DOB: 06-16-1933, 84 y.o.   MRN: [id number]  HPI  Patient presents here today for B/L toenail trim,. This patient presents to the office with long thick painful nails both feet.  Her right big toenail is especially thick and painful due to trauma.  She has pain walking and wearing her shoes.  No history of diabetes.  Review of Systems  All other systems reviewed and are negative.      Objective:   Physical Exam GENERAL APPEARANCE: Alert, conversant. Appropriately groomed. No acute distress.  VASCULAR: Pedal pulses palpable at  [facility] and PT bilateral.  Capillary refill time is immediate to all digits,  Normal temperature gradient.  Digital hair growth is present bilateral  NEUROLOGIC: sensation is normal to 5.07 monofilament at 5/5 sites bilateral.  Light touch is intact bilateral, Muscle strength normal.  MUSCULOSKELETAL: acceptable muscle strength, tone and stability bilateral.  Intrinsic muscluature intact bilateral.  Rectus appearance of foot and digits noted bilateral.   DERMATOLOGIC: skin color, texture, and turgor are within normal limits.  No preulcerative lesions or ulcers  are seen, no interdigital maceration noted.  No open lesions present.  . No drainage noted.  NAILS  Thick disfigured discolored nails both feet.         Assessment & Plan:  Onychomycosis  B/L  Debridement and grinding of long painful nails. RTC4  months   Gardiner Barefoot DPM

## 2017-09-18 DIAGNOSIS — H401133 Primary open-angle glaucoma, bilateral, severe stage: Secondary | ICD-10-CM | POA: Diagnosis not present

## 2017-09-21 DIAGNOSIS — Z0001 Encounter for general adult medical examination with abnormal findings: Secondary | ICD-10-CM | POA: Diagnosis not present

## 2017-09-21 DIAGNOSIS — E038 Other specified hypothyroidism: Secondary | ICD-10-CM | POA: Diagnosis not present

## 2017-09-21 DIAGNOSIS — E7800 Pure hypercholesterolemia, unspecified: Secondary | ICD-10-CM | POA: Diagnosis not present

## 2017-09-21 DIAGNOSIS — Z131 Encounter for screening for diabetes mellitus: Secondary | ICD-10-CM | POA: Diagnosis not present

## 2017-09-27 DIAGNOSIS — R197 Diarrhea, unspecified: Secondary | ICD-10-CM | POA: Diagnosis not present

## 2017-09-27 DIAGNOSIS — Z0001 Encounter for general adult medical examination with abnormal findings: Secondary | ICD-10-CM | POA: Diagnosis not present

## 2017-09-27 DIAGNOSIS — E038 Other specified hypothyroidism: Secondary | ICD-10-CM | POA: Diagnosis not present

## 2017-09-27 DIAGNOSIS — E7800 Pure hypercholesterolemia, unspecified: Secondary | ICD-10-CM | POA: Diagnosis not present

## 2017-09-27 DIAGNOSIS — Z79899 Other long term (current) drug therapy: Secondary | ICD-10-CM | POA: Diagnosis not present

## 2017-09-28 DIAGNOSIS — H401133 Primary open-angle glaucoma, bilateral, severe stage: Secondary | ICD-10-CM | POA: Diagnosis not present

## 2017-10-06 ENCOUNTER — Encounter: Payer: Self-pay | Admitting: Hematology

## 2017-10-06 ENCOUNTER — Inpatient Hospital Stay: Payer: Medicare Other | Attending: Hematology | Admitting: Hematology

## 2017-10-06 ENCOUNTER — Telehealth: Payer: Self-pay

## 2017-10-06 ENCOUNTER — Inpatient Hospital Stay: Payer: Medicare Other

## 2017-10-06 VITALS — BP 114/60 | HR 78 | Temp 98.0°F | Resp 18 | Ht 62.0 in | Wt 133.9 lb

## 2017-10-06 DIAGNOSIS — D473 Essential (hemorrhagic) thrombocythemia: Secondary | ICD-10-CM | POA: Insufficient documentation

## 2017-10-06 DIAGNOSIS — Z87891 Personal history of nicotine dependence: Secondary | ICD-10-CM | POA: Insufficient documentation

## 2017-10-06 DIAGNOSIS — Z7982 Long term (current) use of aspirin: Secondary | ICD-10-CM | POA: Diagnosis not present

## 2017-10-06 LAB — CBC WITH DIFFERENTIAL/PLATELET
BASOS ABS: 0 10*3/uL (ref 0.0–0.1)
BASOS PCT: 0 %
EOS ABS: 0.1 10*3/uL (ref 0.0–0.5)
Eosinophils Relative: 1 %
HCT: 35.6 % (ref 34.8–46.6)
Hemoglobin: 11.9 g/dL (ref 11.6–15.9)
LYMPHS PCT: 10 %
Lymphs Abs: 0.4 10*3/uL — ABNORMAL LOW (ref 0.9–3.3)
MCH: 33.3 pg (ref 25.1–34.0)
MCHC: 33.3 g/dL (ref 31.5–36.0)
MCV: 100 fL (ref 79.5–101.0)
Monocytes Absolute: 0.2 10*3/uL (ref 0.1–0.9)
Monocytes Relative: 4 %
Neutro Abs: 3.7 10*3/uL (ref 1.5–6.5)
Neutrophils Relative %: 85 %
Platelets: 253 10*3/uL (ref 145–400)
RBC: 3.56 MIL/uL — AB (ref 3.70–5.45)
RDW: 16.2 % — AB (ref 11.2–14.5)
WBC: 4.3 10*3/uL (ref 3.9–10.3)

## 2017-10-06 LAB — COMPREHENSIVE METABOLIC PANEL
ALBUMIN: 4.3 g/dL (ref 3.5–5.0)
ALT: 10 U/L (ref 0–44)
AST: 13 U/L — AB (ref 15–41)
Alkaline Phosphatase: 48 U/L (ref 38–126)
Anion gap: 7 (ref 5–15)
BUN: 21 mg/dL (ref 8–23)
CHLORIDE: 110 mmol/L (ref 98–111)
CO2: 26 mmol/L (ref 22–32)
CREATININE: 0.77 mg/dL (ref 0.44–1.00)
Calcium: 9.5 mg/dL (ref 8.9–10.3)
GFR calc Af Amer: >60 mL/min (ref >60)
GFR calc non Af Amer: >60 mL/min (ref >60)
GLUCOSE: 122 mg/dL — AB (ref 70–99)
POTASSIUM: 4.1 mmol/L (ref 3.5–5.1)
SODIUM: 143 mmol/L (ref 135–145)
Total Bilirubin: 0.5 mg/dL (ref 0.3–1.2)
Total Protein: 7 g/dL (ref 6.5–8.1)

## 2017-10-06 MED ORDER — HYDROXYUREA 500 MG PO CAPS: ORAL_CAPSULE | ORAL | 1 refills | Status: DC

## 2017-10-06 NOTE — Progress Notes (Addendum)
[facility] HEMATOLOGY FOLLOW UP NOTE DATE OF VISIT: 10/06/2017   Patient Care Team: Lujean Amel, MD as PCP - General (Family Medicine) Bernadene Bell, MD (Inactive) as Consulting Physician (Hematology)  DIAGNOSIS: Essential Thrombocytosis, diagnosed in Sep 2015  HISTORY OF PRESENTING ILLNESS:  Chelsea Garcia 84 y.o. female  from [place] who was being referred here for an elevated platelet count and seen on 10/01/2013. She recently had blood testing done by her primary care Carlos Levering PA. CBC collected on 09/12/2013 showed a white count of 10.1, hemoglobin 17.4 g, hematocrit 54.7, MCV 87.5, and platelet count of 515. A repeat CBC was done on 09/18/2013. It showed a white count 11.5, hemoglobin 16.5, hematocrit 51, MCV 86 and platelet count was 546. Patient does not have any iron deficiency. She does not have any stressors such as recent surgery, inflammation, infection which can lead to reactive thrombocytosis. Her metabolic panel showed normal glucose, normal kidney function, electrolytes and liver function panel. A serum ferritin was 112. B12 was 521 normal. TSH 7.65 mildly elevated consistent with hypothyroidism. Free T4 0.86 normal. Vitamin D level was 37.2. Tissue transglutaminase IgA was less than 2.   CURRENT THERAPY: Hydroxyuria 500mg  daily started 11/27/2013, and Aspirin 81mg  daily   INTERVAL HISTORY:  Chelsea Garcia returns for follow-up of her Essential Thrombocytosis. She was last seen by me 6 months ago. She presents to the clinic today by herself.  She notes she is doing well overall. She also notes she has glaucoma and taking drops for it. She is still able to see.  She has been taking hydrea and tolerating well. She sees her PCP every 3 months to check her thyroid. She finds it easily to check her labs with our clinic.  She notes her occasional abdominal pain comes from irritable bowel syndrome.      Patient Active Problem List   Diagnosis  Date Noted  . Essential thrombocytosis ([place]) 10/01/2013     MEDICAL HISTORY:  Past Medical History:  Diagnosis Date  . Colon polyps    Benign. 2 colonoscopies in past. Due now again.  . Endometriosis    required TAH when young  . Essential tremor    since 40's  . Glaucoma    started in 40's  . Low back pain    since 40's  . Migraine    started in 50's  . Osteopenia    2014  . Seasonal allergies    to Dust, mold and pollen.  . Thrombocytosis ([place]) September 2015  . Tick bites    she likes out door activities, 2 this year (2015)    SURGICAL HISTORY: Past Surgical History:  Procedure Laterality Date  . CATARACT EXTRACTION Left 02/07/2013  . TOTAL ABDOMINAL HYSTERECTOMY  1970's  . TUBAL LIGATION      SOCIAL HISTORY: Social History   Socioeconomic History  . Marital status: Married    Spouse name: Not on file  . Number of children: Not on file  . Years of education: Not on file  . Highest education level: Not on file  Occupational History  . Not on file  Social Needs  . Financial resource strain: Not on file  . Food insecurity:    Worry: Not on file    Inability: Not on file  . Transportation needs:    Medical: Not on file    Non-medical: Not on file  Tobacco Use  . Smoking status: Former Smoker    Packs/day: 0.25  Types: Cigarettes    Last attempt to quit: 07/01/1961    Years since quitting: 56.3  . Smokeless tobacco: Never Used  Substance and Sexual Activity  . Alcohol use: No  . Drug use: No  . Sexual activity: Yes  Lifestyle  . Physical activity:    Days per week: Not on file    Minutes per session: Not on file  . Stress: Not on file  Relationships  . Social connections:    Talks on phone: Not on file    Gets together: Not on file    Attends religious service: Not on file    Active member of club or organization: Not on file    Attends meetings of clubs or organizations: Not on file    Relationship status: Not on file  . Intimate partner  violence:    Fear of current or ex partner: Not on file    Emotionally abused: Not on file    Physically abused: Not on file    Forced sexual activity: Not on file  Other Topics Concern  . Not on file  Social History Narrative   Patient lives in [place]. Married. Came by herself. She has got 2 sons ages 64 and 75 and 2 grankids boy 48 and daughter 6. She is active, walks every day, semi-retired accountant.     FAMILY HISTORY: Family History  Problem Relation Age of Onset  . Thyroid disease Mother   . Cancer Father   . Cancer Maternal Aunt 80    ALLERGIES:  is allergic to bee pollen; pollen extract; and sulfamethoxazole.  MEDICATIONS:  Current Outpatient Medications  Medication Sig Dispense Refill  . aspirin 81 MG tablet Take 81 mg by mouth daily.    . Aspirin-Calcium Carbonate 81-777 MG TABS Take by mouth.    Marland Kitchen azelastine (ASTELIN) 0.1 % nasal spray Place 1 spray into the nose 2 (two) times daily as needed for allergies.    . bacitracin ophthalmic ointment Apply 1 application to eye at bedtime.    . bimatoprost (LUMIGAN) 0.03 % ophthalmic solution Place 1 drop into both eyes at bedtime.    . dorzolamidel-timolol (COSOPT) 22.3-6.8 MG/ML SOLN ophthalmic solution     . hydroxyurea (HYDREA) 500 MG capsule TAKE ONE CAPSULE BY MOUTH DAILY WITH FOOD TO MINIMIZE GI SIDE EFFECTS 90 capsule 1  . naproxen sodium (ALEVE) 220 MG tablet Take 220-440 mg by mouth daily as needed (pain.).    Marland Kitchen Probiotic Product (PROBIOTIC DAILY PO) Take 1 tablet by mouth daily.    . SUMAtriptan (IMITREX) 100 MG tablet Take 100 mg by mouth every 2 (two) hours as needed for migraine or headache. May repeat in 2 hours if headache persists or recurs.    Marland Kitchen VYZULTA 0.024 % SOLN      No current facility-administered medications for this visit.     REVIEW OF SYSTEMS:   Constitutional: Denies fevers, chills or abnormal night sweats Eyes: Denies blurriness of vision, double vision or watery eyes Ears, nose, mouth,  throat, and face: Denies mucositis or sore throat Respiratory: Denies cough, dyspnea or wheezes, +seasonal allergies Cardiovascular: Denies palpitation, chest discomfort or lower extremity swelling Gastrointestinal:  Denies nausea, heartburn, no bowel complaints (+) IBS with intermittent left abdomin pain  Skin: Denies abnormal skin rashes Lymphatics: Denies new lymphadenopathy or easy bruising Neurological:Denies numbness, tingling or new weaknesses, low back pain Behavioral/Psych: Mood is stable, no new changes  All other systems were reviewed with the patient and are negative.  PHYSICAL EXAMINATION: ECOG PERFORMANCE STATUS: 0  Vitals:   10/06/17 1359  BP: 114/60  Pulse: 78  Resp: 18  Temp: 98 F (36.7 C)  SpO2: 97%   Filed Weights   10/06/17 1359  Weight: 133 lb 14.4 oz (60.7 kg)    GENERAL:alert, no distress and comfortable SKIN: skin color, texture, turgor are normal, no rashes or significant lesions EYES: normal, conjunctiva are pink and non-injected, sclera clear OROPHARYNX:no exudate, no erythema and lips, buccal mucosa, and tongue normal  NECK: supple, thyroid normal size, non-tender, without nodularity LYMPH:  no palpable lymphadenopathy in the cervical, axillary or inguinal LUNGS: clear to auscultation and percussion with normal breathing effort HEART: regular rate & rhythm and no murmurs and no lower extremity edema ABDOMEN:abdomen soft, non-tender and normal bowel sounds, no HSM (+) moderate splenomegaly about 4 cm below ribcage  Musculoskeletal:no cyanosis of digits and no clubbing  PSYCH: alert & oriented x 3 with fluent speech NEURO: no focal motor/sensory deficits  LABORATORY DATA:  I have reviewed the data as listed  CBC Latest Ref Rng & Units 10/06/2017 07/06/2017 04/05/2017  WBC 3.9 - 10.3 K/uL 4.3 5.1 5.2  Hemoglobin 11.6 - 15.9 g/dL 11.9 12.2 12.5  Hematocrit 34.8 - 46.6 % 35.6 37.7 37.8  Platelets 145 - 400 K/uL 253 262 237    CMP Latest Ref Rng &  Units 10/06/2017 04/05/2017 03/21/2017  Glucose 70 - 99 mg/dL 122(H) 109 112(H)  BUN 8 - 23 mg/dL 21 20 24(H)  Creatinine 0.44 - 1.00 mg/dL 0.77 0.83 0.78  Sodium 135 - 145 mmol/L 143 141 138  Potassium 3.5 - 5.1 mmol/L 4.1 4.0 4.0  Chloride 98 - 111 mmol/L 110 107 108  CO2 22 - 32 mmol/L 26 26 21(L)  Calcium 8.9 - 10.3 mg/dL 9.5 9.8 9.7  Total Protein 6.5 - 8.1 g/dL 7.0 7.1 6.8  Total Bilirubin 0.3 - 1.2 mg/dL 0.5 0.6 0.8  Alkaline Phos 38 - 126 U/L 48 54 55  AST 15 - 41 U/L 13(L) 14 16  ALT 0 - 44 U/L 10 12 13(L)   RADIOGRAPHIC STUDIES: No new scans since last visit.  ASSESSMENT & PLAN:  Kittie Krizan is a pleasant 84 y.o. female in good health who presented with mild leukocytosis and thrombocytosis with plt count around 500-650K. JAK2 mutation was positive, BCR-ABL was negative. She has essential thrombocytosis. No history of thrombosis.  1. Essential thrombocytosis, JAK2(+) -due to her advanced age, she is at high risk for thrombosis secondary to ET, and hydrea is recommend.  -We previously discussed the risk of thrombosis. She knows what to watch. -We previously discussed the goal of therapy is palliative, and prevent thrombosis. Unfortunately it is not curable, and there is a small risk in the involved to acute leukemia  and myelofibrosis down the road. -She has been taking hydrea for about 4 years now. I again discussed the side effects and risk of hydrea and she understands to keep taking ASA.  -Continue Hydrea 500mg  daily.  -continue aspirin 81 mg once daily -She is clinically doing very well, tolerating 500mg  hydroxyurea without any noticeable side effects. Her plt are normal today at 253K and have previously been normal and well controlled.  -Continue labs every 3 months and follow up with me in 9 months. She will continue to see her PCP every 3 months in interim. I can see her sooner if needed.    2. IBS -Pt went to the ED on 03/21/17 for LLQ abdominal  pain onset 3 weeks prior  to ED visit. She was discharged with a short course of steroids She reports to only have pain once a week now that she takes Tylenol as needed for. She has an upcoming appointment with a gastroenterologist, Dr. Michail Sermon.  -I previously discussed her CT AP W Contrast from 03/21/17 findings with her. She has diverticulosis and moderate splenomegaly. I suspect her diverticulosis was likely the cause of her pain, certainly splenomegaly can cause abdominal pain also, but her pain is not very typical. -Pt contribute her pain to IBS, it's overall mild and manageable   PLAN: -Repeat CBC every 3  months. I'll see her back in 9 months. -continue Hydrea 500 mg daily  All questions were answered. The patient knows to call the clinic with any problems, questions or concerns.  I spent 15 minutes counseling the patient face to face. The total time spent in the appointment was 20 minutes and more than 50% was on counseling.  Oneal Deputy, am acting as scribe for Truitt Merle, MD.   I have reviewed the above documentation for accuracy and completeness, and I agree with the above.    Truitt Merle  10/06/2017 3:19 PM

## 2017-10-06 NOTE — Telephone Encounter (Signed)
Printed avs and calender of upcoming appointment. Per 9/27 los

## 2017-11-01 DIAGNOSIS — H6123 Impacted cerumen, bilateral: Secondary | ICD-10-CM | POA: Diagnosis not present

## 2017-11-01 DIAGNOSIS — Z87891 Personal history of nicotine dependence: Secondary | ICD-10-CM | POA: Diagnosis not present

## 2017-11-01 DIAGNOSIS — J342 Deviated nasal septum: Secondary | ICD-10-CM | POA: Diagnosis not present

## 2017-11-01 DIAGNOSIS — H9193 Unspecified hearing loss, bilateral: Secondary | ICD-10-CM | POA: Insufficient documentation

## 2017-11-01 DIAGNOSIS — Z7289 Other problems related to lifestyle: Secondary | ICD-10-CM | POA: Diagnosis not present

## 2017-11-01 DIAGNOSIS — J302 Other seasonal allergic rhinitis: Secondary | ICD-10-CM | POA: Diagnosis not present

## 2017-11-11 DIAGNOSIS — Z23 Encounter for immunization: Secondary | ICD-10-CM | POA: Diagnosis not present

## 2017-12-11 DIAGNOSIS — H401133 Primary open-angle glaucoma, bilateral, severe stage: Secondary | ICD-10-CM | POA: Diagnosis not present

## 2017-12-17 ENCOUNTER — Other Ambulatory Visit: Payer: Self-pay | Admitting: Hematology

## 2017-12-17 DIAGNOSIS — D473 Essential (hemorrhagic) thrombocythemia: Secondary | ICD-10-CM

## 2017-12-29 ENCOUNTER — Ambulatory Visit (INDEPENDENT_AMBULATORY_CARE_PROVIDER_SITE_OTHER): Payer: Medicare Other | Admitting: Podiatry

## 2017-12-29 ENCOUNTER — Encounter: Payer: Self-pay | Admitting: Podiatry

## 2017-12-29 DIAGNOSIS — M79676 Pain in unspecified toe(s): Secondary | ICD-10-CM

## 2017-12-29 DIAGNOSIS — B351 Tinea unguium: Secondary | ICD-10-CM

## 2017-12-29 DIAGNOSIS — M79609 Pain in unspecified limb: Principal | ICD-10-CM

## 2017-12-29 NOTE — Progress Notes (Signed)
   Subjective:    Patient ID: Chelsea Garcia, female    DOB: 05/08/1933, 84 y.o.   MRN: [id number]  HPI  Patient presents here today for B/L toenail trim,. This patient presents to the office with long thick painful nails both feet.  Her right big toenail is especially thick and painful due to trauma.  She has pain walking and wearing her shoes.  No history of diabetes.  Review of Systems  All other systems reviewed and are negative.      Objective:   Physical Exam GENERAL APPEARANCE: Alert, conversant. Appropriately groomed. No acute distress.  VASCULAR: Pedal pulses palpable at  [facility] and PT bilateral.  Capillary refill time is immediate to all digits,  Normal temperature gradient.  Digital hair growth is present bilateral  NEUROLOGIC: sensation is normal to 5.07 monofilament at 5/5 sites bilateral.  Light touch is intact bilateral, Muscle strength normal.  MUSCULOSKELETAL: acceptable muscle strength, tone and stability bilateral.  Intrinsic muscluature intact bilateral.  Rectus appearance of foot and digits noted bilateral.   DERMATOLOGIC: skin color, texture, and turgor are within normal limits.  No preulcerative lesions or ulcers  are seen, no interdigital maceration noted.  No open lesions present.  . No drainage noted.  NAILS  Thick disfigured discolored nails both feet.         Assessment & Plan:  Onychomycosis  B/L  Debridement and grinding of long painful nails. RTC 3  months   Gardiner Barefoot DPM

## 2018-01-04 ENCOUNTER — Other Ambulatory Visit: Payer: Self-pay

## 2018-01-04 DIAGNOSIS — E039 Hypothyroidism, unspecified: Secondary | ICD-10-CM

## 2018-01-04 DIAGNOSIS — D473 Essential (hemorrhagic) thrombocythemia: Secondary | ICD-10-CM

## 2018-01-05 ENCOUNTER — Inpatient Hospital Stay: Payer: Medicare Other | Attending: Hematology

## 2018-01-05 DIAGNOSIS — D473 Essential (hemorrhagic) thrombocythemia: Secondary | ICD-10-CM | POA: Insufficient documentation

## 2018-01-05 DIAGNOSIS — E039 Hypothyroidism, unspecified: Secondary | ICD-10-CM | POA: Insufficient documentation

## 2018-01-05 LAB — CBC WITH DIFFERENTIAL/PLATELET
Abs Immature Granulocytes: 0.03 10*3/uL (ref 0.00–0.07)
BASOS ABS: 0 10*3/uL (ref 0.0–0.1)
Basophils Relative: 0 %
Eosinophils Absolute: 0.1 10*3/uL (ref 0.0–0.5)
Eosinophils Relative: 1 %
HCT: 36.3 % (ref 36.0–46.0)
Hemoglobin: 11.6 g/dL — ABNORMAL LOW (ref 12.0–15.0)
Immature Granulocytes: 1 %
LYMPHS PCT: 10 %
Lymphs Abs: 0.6 10*3/uL — ABNORMAL LOW (ref 0.7–4.0)
MCH: 31.8 pg (ref 26.0–34.0)
MCHC: 32 g/dL (ref 30.0–36.0)
MCV: 99.5 fL (ref 80.0–100.0)
Monocytes Absolute: 0.2 10*3/uL (ref 0.1–1.0)
Monocytes Relative: 4 %
NEUTROS ABS: 5 10*3/uL (ref 1.7–7.7)
Neutrophils Relative %: 84 %
PLATELETS: 258 10*3/uL (ref 150–400)
RBC: 3.65 MIL/uL — AB (ref 3.87–5.11)
RDW: 15.8 % — AB (ref 11.5–15.5)
WBC: 5.9 10*3/uL (ref 4.0–10.5)
nRBC: 0 % (ref 0.0–0.2)

## 2018-01-05 LAB — TSH: TSH: 3.075 u[IU]/mL (ref 0.308–3.960)

## 2018-01-08 ENCOUNTER — Telehealth: Payer: Self-pay

## 2018-01-08 NOTE — Telephone Encounter (Signed)
-----   Message from Truitt Merle, MD sent at 01/08/2018  8:02 AM EST ----- Please let pt know the lab results, she has developed mild anemia, will decrease her hydrea from 500mg  daily to 500mg  M-F only, and skip weekends, and please schedule lab in 6 weeks (she is on lab every 3 months now) for close monitoring, thanks   Truitt Merle  01/08/2018

## 2018-01-08 NOTE — Telephone Encounter (Signed)
Left voice message for patient to decrease Hydrea from 500 mg daily to taking 500 mg daily Monday through Friday, do not take it all on Saturday and Sundays.  Will recheck lab in 6 weeks, scheduling will call with an appointment.   Faxed copy of recent labs to patient's PCP Dr. Lujean Amel

## 2018-02-21 ENCOUNTER — Inpatient Hospital Stay: Payer: Medicare Other | Attending: Hematology

## 2018-02-21 DIAGNOSIS — D473 Essential (hemorrhagic) thrombocythemia: Secondary | ICD-10-CM | POA: Diagnosis not present

## 2018-02-21 DIAGNOSIS — Z7982 Long term (current) use of aspirin: Secondary | ICD-10-CM | POA: Insufficient documentation

## 2018-02-21 DIAGNOSIS — Z87891 Personal history of nicotine dependence: Secondary | ICD-10-CM | POA: Insufficient documentation

## 2018-02-21 LAB — CBC WITH DIFFERENTIAL/PLATELET
Abs Immature Granulocytes: 0.03 10*3/uL (ref 0.00–0.07)
BASOS PCT: 0 %
Basophils Absolute: 0 10*3/uL (ref 0.0–0.1)
EOS ABS: 0.1 10*3/uL (ref 0.0–0.5)
EOS PCT: 1 %
HEMATOCRIT: 37 % (ref 36.0–46.0)
Hemoglobin: 11.7 g/dL — ABNORMAL LOW (ref 12.0–15.0)
IMMATURE GRANULOCYTES: 1 %
Lymphocytes Relative: 10 %
Lymphs Abs: 0.4 10*3/uL — ABNORMAL LOW (ref 0.7–4.0)
MCH: 31 pg (ref 26.0–34.0)
MCHC: 31.6 g/dL (ref 30.0–36.0)
MCV: 97.9 fL (ref 80.0–100.0)
MONOS PCT: 5 %
Monocytes Absolute: 0.3 10*3/uL (ref 0.1–1.0)
NEUTROS PCT: 83 %
Neutro Abs: 3.9 10*3/uL (ref 1.7–7.7)
PLATELETS: 264 10*3/uL (ref 150–400)
RBC: 3.78 MIL/uL — ABNORMAL LOW (ref 3.87–5.11)
RDW: 15.6 % — AB (ref 11.5–15.5)
WBC: 4.7 10*3/uL (ref 4.0–10.5)
nRBC: 0 % (ref 0.0–0.2)

## 2018-02-21 LAB — COMPREHENSIVE METABOLIC PANEL
ALK PHOS: 55 U/L (ref 38–126)
ALT: 10 U/L (ref 0–44)
ANION GAP: 9 (ref 5–15)
AST: 15 U/L (ref 15–41)
Albumin: 4.1 g/dL (ref 3.5–5.0)
BILIRUBIN TOTAL: 0.6 mg/dL (ref 0.3–1.2)
BUN: 21 mg/dL (ref 8–23)
CALCIUM: 9.2 mg/dL (ref 8.9–10.3)
CO2: 24 mmol/L (ref 22–32)
CREATININE: 0.86 mg/dL (ref 0.44–1.00)
Chloride: 107 mmol/L (ref 98–111)
GFR calc non Af Amer: >60 mL/min (ref >60)
GLUCOSE: 93 mg/dL (ref 70–99)
Potassium: 4.4 mmol/L (ref 3.5–5.1)
Sodium: 140 mmol/L (ref 135–145)
TOTAL PROTEIN: 6.9 g/dL (ref 6.5–8.1)

## 2018-03-28 ENCOUNTER — Ambulatory Visit: Payer: Medicare Other | Admitting: Podiatry

## 2018-04-06 ENCOUNTER — Other Ambulatory Visit: Payer: Self-pay

## 2018-04-06 ENCOUNTER — Inpatient Hospital Stay: Payer: Medicare Other | Attending: Hematology

## 2018-04-06 DIAGNOSIS — D473 Essential (hemorrhagic) thrombocythemia: Secondary | ICD-10-CM | POA: Insufficient documentation

## 2018-04-06 LAB — CBC WITH DIFFERENTIAL/PLATELET
Abs Immature Granulocytes: 0.03 10*3/uL (ref 0.00–0.07)
BASOS PCT: 0 %
Basophils Absolute: 0 10*3/uL (ref 0.0–0.1)
EOS ABS: 0.1 10*3/uL (ref 0.0–0.5)
EOS PCT: 1 %
HCT: 38.2 % (ref 36.0–46.0)
Hemoglobin: 11.7 g/dL — ABNORMAL LOW (ref 12.0–15.0)
Immature Granulocytes: 1 %
Lymphocytes Relative: 9 %
Lymphs Abs: 0.5 10*3/uL — ABNORMAL LOW (ref 0.7–4.0)
MCH: 30.5 pg (ref 26.0–34.0)
MCHC: 30.6 g/dL (ref 30.0–36.0)
MCV: 99.7 fL (ref 80.0–100.0)
MONO ABS: 0.2 10*3/uL (ref 0.1–1.0)
MONOS PCT: 4 %
NEUTROS ABS: 4.8 10*3/uL (ref 1.7–7.7)
Neutrophils Relative %: 85 %
PLATELETS: 297 10*3/uL (ref 150–400)
RBC: 3.83 MIL/uL — AB (ref 3.87–5.11)
RDW: 16.2 % — AB (ref 11.5–15.5)
WBC: 5.6 10*3/uL (ref 4.0–10.5)
nRBC: 0 % (ref 0.0–0.2)

## 2018-04-06 LAB — COMPREHENSIVE METABOLIC PANEL
ALT: 13 U/L (ref 0–44)
ANION GAP: 9 (ref 5–15)
AST: 15 U/L (ref 15–41)
Albumin: 4.2 g/dL (ref 3.5–5.0)
Alkaline Phosphatase: 55 U/L (ref 38–126)
BUN: 24 mg/dL — ABNORMAL HIGH (ref 8–23)
CHLORIDE: 107 mmol/L (ref 98–111)
CO2: 24 mmol/L (ref 22–32)
Calcium: 9.5 mg/dL (ref 8.9–10.3)
Creatinine, Ser: 0.79 mg/dL (ref 0.44–1.00)
Glucose, Bld: 100 mg/dL — ABNORMAL HIGH (ref 70–99)
Potassium: 4.2 mmol/L (ref 3.5–5.1)
SODIUM: 140 mmol/L (ref 135–145)
Total Bilirubin: 0.5 mg/dL (ref 0.3–1.2)
Total Protein: 7.1 g/dL (ref 6.5–8.1)

## 2018-04-09 ENCOUNTER — Telehealth: Payer: Self-pay

## 2018-04-09 NOTE — Telephone Encounter (Signed)
-----   Message from Truitt Merle, MD sent at 04/06/2018  9:31 PM EDT ----- Please let her know the lab results, no concerns, thanks   Truitt Merle  04/06/2018

## 2018-04-09 NOTE — Telephone Encounter (Signed)
Left voice message for patient that lab results no concerns per Dr. Burr Medico.

## 2018-06-29 ENCOUNTER — Telehealth: Payer: Self-pay | Admitting: Hematology

## 2018-06-29 NOTE — Telephone Encounter (Signed)
Patient wishes to keep her 6/26 appointments as scheduled.

## 2018-06-29 NOTE — Progress Notes (Signed)
[name]   [phone] [phone]   Clinic Follow up Note   Patient Care Team: Lujean Amel, MD as PCP - General (Family Medicine) Bernadene Bell, MD (Inactive) as Consulting Physician (Hematology)  Date of Service:  07/06/2018  CHIEF COMPLAINT: Essential Thrombocytosis, diagnosed in Sep 2015  SUMMARY OF ONCOLOGIC HISTORY: Oncology History  Essential thrombocytosis ([place])  10/01/2013 Initial Diagnosis   Essential thrombocytosis      CURRENT THERAPY:  -Hydroxyuria 500mg  daily started 11/27/2013. Decreased to 500mg  M-F in 12/2017, further decrease to 500mg  MWF on 07/06/2018  -Aspirin 81mg  daily   INTERVAL HISTORY:  Chelsea Garcia is here for a follow up ET. She was last seen by me 9 months ago. She presents to the clinic alone. She notes he feels well and denies fatigue. She denies chest pain or SOB. She is taking hydrea 500mg  M-F. She notes ne changes in her medication other than eye drops.  She notes she has been tolerating Hydrea well with no side effects.    REVIEW OF SYSTEMS:   Constitutional: Denies fevers, chills or abnormal weight loss Eyes: Denies blurriness of vision Ears, nose, mouth, throat, and face: Denies mucositis or sore throat Respiratory: Denies cough, dyspnea or wheezes Cardiovascular: Denies palpitation, chest discomfort or lower extremity swelling Gastrointestinal:  Denies nausea, heartburn or change in bowel habits Skin: Denies abnormal skin rashes Lymphatics: Denies new lymphadenopathy or easy bruising Neurological:Denies numbness, tingling or new weaknesses Behavioral/Psych: Mood is stable, no new changes  All other systems were reviewed with the patient and are negative.  MEDICAL HISTORY:  Past Medical History:  Diagnosis Date  . Colon polyps    Benign. 2 colonoscopies in past. Due now again.  . Endometriosis    required TAH when young  . Essential tremor    since 40's  . Glaucoma    started in 40's  . Low  back pain    since 40's  . Migraine    started in 50's  . Osteopenia    2014  . Seasonal allergies    to Dust, mold and pollen.  . Thrombocytosis ([place]) September 2015  . Tick bites    she likes out door activities, 2 this year (2015)    SURGICAL HISTORY: Past Surgical History:  Procedure Laterality Date  . CATARACT EXTRACTION Left 02/07/2013  . TOTAL ABDOMINAL HYSTERECTOMY  1970's  . TUBAL LIGATION      I have reviewed the social history and family history with the patient and they are unchanged from previous note.  ALLERGIES:  is allergic to bee pollen; pollen extract; and sulfamethoxazole.  MEDICATIONS:  Current Outpatient Medications  Medication Sig Dispense Refill  . aspirin 81 MG tablet Take 81 mg by mouth daily.    Marland Kitchen azelastine (ASTELIN) 0.1 % nasal spray Place 1 spray into the nose 2 (two) times daily as needed for allergies.    . bacitracin ophthalmic ointment Apply 1 application to eye at bedtime.    . dorzolamidel-timolol (COSOPT) 22.3-6.8 MG/ML SOLN ophthalmic solution     . hydroxyurea (HYDREA) 500 MG capsule TAKE ONE CAPSULE BY MOUTH DAILY WITH FOOD TO MINIMIZE GI SIDE EFFECTS (Patient taking differently: Take by mouth. TAKE ONE CAPSULE BY MOUTH DAILY WITH FOOD TO MINIMIZE GI SIDE EFFECTS  M - F .  Nothing on  Sat &  Sun.) 90 capsule 1  . Latanoprostene Bunod 0.024 % SOLN     . naproxen sodium (ALEVE) 220 MG tablet Take by mouth.    Marland Kitchen  Netarsudil Dimesylate (RHOPRESSA) 0.02 % SOLN INT 1 GTT INTO OU QD    . Probiotic Product (PROBIOTIC DAILY PO) Take 1 tablet by mouth daily.    . SUMAtriptan (IMITREX) 100 MG tablet Take 100 mg by mouth every 2 (two) hours as needed for migraine or headache. May repeat in 2 hours if headache persists or recurs.     No current facility-administered medications for this visit.     PHYSICAL EXAMINATION: ECOG PERFORMANCE STATUS: 1 - Symptomatic but completely ambulatory  Vitals:   07/06/18 1401  BP: (!) 125/52  Pulse: 79  Resp:  17  Temp: 98.2 F (36.8 C)  SpO2: 100%   Filed Weights   07/06/18 1401  Weight: 137 lb 9.6 oz (62.4 kg)    GENERAL:alert, no distress and comfortable SKIN: skin color, texture, turgor are normal, no rashes or significant lesions EYES: normal, Conjunctiva are pink and non-injected, sclera clear  NECK: supple, thyroid normal size, non-tender, without nodularity LYMPH:  no palpable lymphadenopathy in the cervical, axillary  LUNGS: clear to auscultation and percussion with normal breathing effort HEART: regular rate & rhythm and no murmurs and no lower extremity edema ABDOMEN:abdomen soft, non-tender and normal bowel sounds Musculoskeletal:no cyanosis of digits and no clubbing  NEURO: alert & oriented x 3 with fluent speech, no focal motor/sensory deficits  LABORATORY DATA:  I have reviewed the data as listed CBC Latest Ref Rng & Units 07/06/2018 04/06/2018 02/21/2018  WBC 4.0 - 10.5 K/uL 5.8 5.6 4.7  Hemoglobin 12.0 - 15.0 g/dL 10.9(L) 11.7(L) 11.7(L)  Hematocrit 36.0 - 46.0 % 34.7(L) 38.2 37.0  Platelets 150 - 400 K/uL 293 297 264     CMP Latest Ref Rng & Units 04/06/2018 02/21/2018 10/06/2017  Glucose 70 - 99 mg/dL 100(H) 93 122(H)  BUN 8 - 23 mg/dL 24(H) 21 21  Creatinine 0.44 - 1.00 mg/dL 0.79 0.86 0.77  Sodium 135 - 145 mmol/L 140 140 143  Potassium 3.5 - 5.1 mmol/L 4.2 4.4 4.1  Chloride 98 - 111 mmol/L 107 107 110  CO2 22 - 32 mmol/L 24 24 26   Calcium 8.9 - 10.3 mg/dL 9.5 9.2 9.5  Total Protein 6.5 - 8.1 g/dL 7.1 6.9 7.0  Total Bilirubin 0.3 - 1.2 mg/dL 0.5 0.6 0.5  Alkaline Phos 38 - 126 U/L 55 55 48  AST 15 - 41 U/L 15 15 13(L)  ALT 0 - 44 U/L 13 10 10       RADIOGRAPHIC STUDIES: I have personally reviewed the radiological images as listed and agreed with the findings in the report. No results found.   ASSESSMENT & PLAN:  Chelsea Garcia is a 85 y.o. female with    1. Essential thrombocytosis, JAK2(+) -Due to her advanced age, she is at high risk for thrombosis  secondary to ET, and hydrea is recommend.  -We previously discussed the risk of thrombosis. She knows what to watch. -We previously discussed the goal of therapy is palliative, and prevent thrombosis. Unfortunately it is not curable, and there is a small risk in the involved to acute leukemia  and myelofibrosis down the road. -She has been taking hydrea since 2015. I again discussed the side effects and risk of hydrea and she understands to keep taking ASA.  -continue aspirin 81 mg once daily -She is overall clinically doing well. Labs reviewed, CBC WNL except Hg 10.9. Will further reduce her hydrea to 500mg  MWF only due to her worsening anemia and normal plt.  -Will recheck labs in 2  months  -F/u in 5 months    2. IBS -Pt went to the ED on 03/21/17 for LLQ abdominal pain onset 3 weeks prior to ED visit. She was discharged with a short course of steroids She reports to only have pain once a week now that she takes Tylenol as needed for. She has an upcoming appointment with a gastroenterologist, Dr. Michail Sermon.  -I previously discussed her CT AP W Contrast from 03/21/17 findings with her. She has diverticulosis and moderate splenomegaly. I suspect her diverticulosis was likely the cause of her pain, certainly splenomegaly can cause abdominal pain also, but her pain is not very typical. -Pt contribute her pain to IBS, it's overall mild and manageable  -Not mentioned today, likely pain much improved or resolved.   PLAN: -labs reviewed Hg at 10.9 Will decrease Hydrea to 500mg  WMF. Refilled today  -Lab in 2 months  -Lab and f/u in 5 months    No problem-specific Assessment & Plan notes found for this encounter.   No orders of the defined types were placed in this encounter.  All questions were answered. The patient knows to call the clinic with any problems, questions or concerns. No barriers to learning was detected. I spent 10 minutes counseling the patient face to face. The total time spent  in the appointment was 15 minutes and more than 50% was on counseling and review of test results     Truitt Merle, MD 07/06/2018   I, Joslyn Devon, am acting as scribe for Truitt Merle, MD.   I have reviewed the above documentation for accuracy and completeness, and I agree with the above.

## 2018-07-05 ENCOUNTER — Telehealth: Payer: Self-pay

## 2018-07-05 NOTE — Telephone Encounter (Signed)
Called and left voicemail regarding pre-screening questions for appt on 6/26  

## 2018-07-06 ENCOUNTER — Telehealth: Payer: Self-pay | Admitting: Hematology

## 2018-07-06 ENCOUNTER — Inpatient Hospital Stay: Payer: Medicare Other | Attending: Hematology | Admitting: Hematology

## 2018-07-06 ENCOUNTER — Encounter: Payer: Self-pay | Admitting: Hematology

## 2018-07-06 ENCOUNTER — Inpatient Hospital Stay: Payer: Medicare Other

## 2018-07-06 ENCOUNTER — Other Ambulatory Visit: Payer: Self-pay

## 2018-07-06 VITALS — BP 125/52 | HR 79 | Temp 98.2°F | Resp 17 | Ht 62.0 in | Wt 137.6 lb

## 2018-07-06 DIAGNOSIS — D473 Essential (hemorrhagic) thrombocythemia: Secondary | ICD-10-CM

## 2018-07-06 DIAGNOSIS — D649 Anemia, unspecified: Secondary | ICD-10-CM | POA: Insufficient documentation

## 2018-07-06 DIAGNOSIS — Z8719 Personal history of other diseases of the digestive system: Secondary | ICD-10-CM | POA: Diagnosis not present

## 2018-07-06 DIAGNOSIS — Z882 Allergy status to sulfonamides status: Secondary | ICD-10-CM | POA: Diagnosis not present

## 2018-07-06 DIAGNOSIS — K589 Irritable bowel syndrome without diarrhea: Secondary | ICD-10-CM | POA: Diagnosis not present

## 2018-07-06 LAB — CBC WITH DIFFERENTIAL/PLATELET
Abs Immature Granulocytes: 0.03 10*3/uL (ref 0.00–0.07)
Basophils Absolute: 0 10*3/uL (ref 0.0–0.1)
Basophils Relative: 0 %
Eosinophils Absolute: 0.1 10*3/uL (ref 0.0–0.5)
Eosinophils Relative: 1 %
HCT: 34.7 % — ABNORMAL LOW (ref 36.0–46.0)
Hemoglobin: 10.9 g/dL — ABNORMAL LOW (ref 12.0–15.0)
Immature Granulocytes: 1 %
Lymphocytes Relative: 8 %
Lymphs Abs: 0.5 10*3/uL — ABNORMAL LOW (ref 0.7–4.0)
MCH: 30.4 pg (ref 26.0–34.0)
MCHC: 31.4 g/dL (ref 30.0–36.0)
MCV: 96.9 fL (ref 80.0–100.0)
Monocytes Absolute: 0.3 10*3/uL (ref 0.1–1.0)
Monocytes Relative: 6 %
Neutro Abs: 4.9 10*3/uL (ref 1.7–7.7)
Neutrophils Relative %: 84 %
Platelets: 293 10*3/uL (ref 150–400)
RBC: 3.58 MIL/uL — ABNORMAL LOW (ref 3.87–5.11)
RDW: 17.3 % — ABNORMAL HIGH (ref 11.5–15.5)
WBC: 5.8 10*3/uL (ref 4.0–10.5)
nRBC: 0 % (ref 0.0–0.2)

## 2018-07-06 NOTE — Telephone Encounter (Signed)
Scheduled appt per 6/26 los. Left a voice message of appt date and time.

## 2018-07-25 DIAGNOSIS — H401133 Primary open-angle glaucoma, bilateral, severe stage: Secondary | ICD-10-CM | POA: Diagnosis not present

## 2018-08-09 DIAGNOSIS — R197 Diarrhea, unspecified: Secondary | ICD-10-CM | POA: Diagnosis not present

## 2018-09-05 ENCOUNTER — Other Ambulatory Visit: Payer: Self-pay

## 2018-09-05 ENCOUNTER — Inpatient Hospital Stay: Payer: Medicare Other | Attending: Hematology

## 2018-09-05 DIAGNOSIS — D473 Essential (hemorrhagic) thrombocythemia: Secondary | ICD-10-CM | POA: Diagnosis not present

## 2018-09-05 LAB — CBC WITH DIFFERENTIAL/PLATELET
Abs Immature Granulocytes: 0.09 10*3/uL — ABNORMAL HIGH (ref 0.00–0.07)
Basophils Absolute: 0 10*3/uL (ref 0.0–0.1)
Basophils Relative: 0 %
Eosinophils Absolute: 0.1 10*3/uL (ref 0.0–0.5)
Eosinophils Relative: 1 %
HCT: 35.1 % — ABNORMAL LOW (ref 36.0–46.0)
Hemoglobin: 10.9 g/dL — ABNORMAL LOW (ref 12.0–15.0)
Immature Granulocytes: 1 %
Lymphocytes Relative: 8 %
Lymphs Abs: 0.5 10*3/uL — ABNORMAL LOW (ref 0.7–4.0)
MCH: 29.3 pg (ref 26.0–34.0)
MCHC: 31.1 g/dL (ref 30.0–36.0)
MCV: 94.4 fL (ref 80.0–100.0)
Monocytes Absolute: 0.4 10*3/uL (ref 0.1–1.0)
Monocytes Relative: 5 %
Neutro Abs: 5.8 10*3/uL (ref 1.7–7.7)
Neutrophils Relative %: 85 %
Platelets: 324 10*3/uL (ref 150–400)
RBC: 3.72 MIL/uL — ABNORMAL LOW (ref 3.87–5.11)
RDW: 17 % — ABNORMAL HIGH (ref 11.5–15.5)
WBC: 6.8 10*3/uL (ref 4.0–10.5)
nRBC: 0 % (ref 0.0–0.2)

## 2018-09-05 LAB — COMPREHENSIVE METABOLIC PANEL
ALT: 17 U/L (ref 0–44)
AST: 15 U/L (ref 15–41)
Albumin: 4.2 g/dL (ref 3.5–5.0)
Alkaline Phosphatase: 49 U/L (ref 38–126)
Anion gap: 8 (ref 5–15)
BUN: 20 mg/dL (ref 8–23)
CO2: 24 mmol/L (ref 22–32)
Calcium: 9.2 mg/dL (ref 8.9–10.3)
Chloride: 110 mmol/L (ref 98–111)
Creatinine, Ser: 0.91 mg/dL (ref 0.44–1.00)
GFR calc Af Amer: >60 mL/min (ref >60)
GFR calc non Af Amer: 58 mL/min — ABNORMAL LOW (ref >60)
Glucose, Bld: 103 mg/dL — ABNORMAL HIGH (ref 70–99)
Potassium: 3.9 mmol/L (ref 3.5–5.1)
Sodium: 142 mmol/L (ref 135–145)
Total Bilirubin: 0.4 mg/dL (ref 0.3–1.2)
Total Protein: 7 g/dL (ref 6.5–8.1)

## 2018-10-08 DIAGNOSIS — E038 Other specified hypothyroidism: Secondary | ICD-10-CM | POA: Diagnosis not present

## 2018-10-08 DIAGNOSIS — R7301 Impaired fasting glucose: Secondary | ICD-10-CM | POA: Diagnosis not present

## 2018-10-08 DIAGNOSIS — Z0001 Encounter for general adult medical examination with abnormal findings: Secondary | ICD-10-CM | POA: Diagnosis not present

## 2018-10-08 DIAGNOSIS — R109 Unspecified abdominal pain: Secondary | ICD-10-CM | POA: Diagnosis not present

## 2018-10-08 DIAGNOSIS — E2839 Other primary ovarian failure: Secondary | ICD-10-CM | POA: Diagnosis not present

## 2018-10-08 DIAGNOSIS — E7800 Pure hypercholesterolemia, unspecified: Secondary | ICD-10-CM | POA: Diagnosis not present

## 2018-10-08 DIAGNOSIS — Z79899 Other long term (current) drug therapy: Secondary | ICD-10-CM | POA: Diagnosis not present

## 2018-10-19 DIAGNOSIS — R7301 Impaired fasting glucose: Secondary | ICD-10-CM | POA: Diagnosis not present

## 2018-10-19 DIAGNOSIS — E038 Other specified hypothyroidism: Secondary | ICD-10-CM | POA: Diagnosis not present

## 2018-10-19 DIAGNOSIS — E7800 Pure hypercholesterolemia, unspecified: Secondary | ICD-10-CM | POA: Diagnosis not present

## 2018-11-05 ENCOUNTER — Telehealth: Payer: Self-pay | Admitting: Hematology

## 2018-11-05 NOTE — Telephone Encounter (Signed)
YF PAL 11/25. Moved appointment from 11/25 to 12/3. Left message. Schedule mailed.

## 2018-11-13 DIAGNOSIS — H18832 Recurrent erosion of cornea, left eye: Secondary | ICD-10-CM | POA: Diagnosis not present

## 2018-11-14 ENCOUNTER — Ambulatory Visit: Payer: Medicare Other | Admitting: Podiatry

## 2018-12-03 ENCOUNTER — Other Ambulatory Visit: Payer: Self-pay | Admitting: Family Medicine

## 2018-12-03 ENCOUNTER — Ambulatory Visit
Admission: RE | Admit: 2018-12-03 | Discharge: 2018-12-03 | Disposition: A | Discharge status: Home / Self Care | Payer: Medicare Other | Source: Ambulatory Visit | Attending: Family Medicine | Admitting: Family Medicine

## 2018-12-03 DIAGNOSIS — R05 Cough: Secondary | ICD-10-CM

## 2018-12-03 DIAGNOSIS — R059 Cough, unspecified: Secondary | ICD-10-CM

## 2018-12-05 ENCOUNTER — Other Ambulatory Visit: Payer: Medicare Other

## 2018-12-05 ENCOUNTER — Ambulatory Visit: Payer: Medicare Other | Admitting: Hematology

## 2018-12-13 ENCOUNTER — Inpatient Hospital Stay: Payer: Medicare Other

## 2018-12-13 ENCOUNTER — Inpatient Hospital Stay: Payer: Medicare Other | Admitting: Hematology

## 2018-12-13 ENCOUNTER — Telehealth: Payer: Self-pay | Admitting: Hematology

## 2018-12-13 NOTE — Telephone Encounter (Signed)
Returned patient's phone call regarding rescheduling an appointment, left a voicemail. 

## 2018-12-13 NOTE — Telephone Encounter (Signed)
Patient returned phone call regarding rescheduling 12/03 appointment, per patient's request appointment has moved to 12/15.

## 2018-12-21 NOTE — Progress Notes (Signed)
[place]   Telephone:(336) [phone] [phone]   Clinic Follow up Note   Patient Care Team: Lujean Amel, MD as PCP - General (Family Medicine) Bernadene Bell, MD (Inactive) as Consulting Physician (Hematology)  Date of Service:  12/25/2018  CHIEF COMPLAINT: Essential Thrombocytosis, diagnosed in Sep 2015  SUMMARY OF ONCOLOGIC HISTORY: Oncology History  Essential thrombocytosis ([place])  10/01/2013 Initial Diagnosis   Essential thrombocytosis      CURRENT THERAPY:  -Hydroxyuria '500mg'$  daily started 11/27/2013. Decreased to '500mg'$  M-F in 12/2017, further decrease to '500mg'$  MWF on 07/06/2018. Reduced to '500mg'$  on Mondays and Thursday starting 12/25/18.  -Aspirin '81mg'$  daily   INTERVAL HISTORY:  Chelsea Garcia is here for a follow up ET. She was last seen by me 6 months ago. She presents to the clinic alone. She notes she is doing well. She denies any new changes, recent bleeding or infections. She notes her eye drops are changed but her other medications are the same. She takes Hydrea once on MWF, tolerates well.  She notes she lives 10-15 miles from clinic and has family transport her.     REVIEW OF SYSTEMS:   Constitutional: Denies fevers, chills or abnormal weight loss Eyes: Denies blurriness of vision Ears, nose, mouth, throat, and face: Denies mucositis or sore throat Respiratory: Denies cough, dyspnea or wheezes Cardiovascular: Denies palpitation, chest discomfort or lower extremity swelling Gastrointestinal:  Denies nausea, heartburn or change in bowel habits Skin: Denies abnormal skin rashes Lymphatics: Denies new lymphadenopathy or easy bruising Neurological:Denies numbness, tingling or new weaknesses Behavioral/Psych: Mood is stable, no new changes  All other systems were reviewed with the patient and are negative.  MEDICAL HISTORY:  Past Medical History:  Diagnosis Date  . Colon polyps    Benign. 2 colonoscopies in past. Due now again.  .  Endometriosis    required TAH when young  . Essential tremor    since 40's  . Glaucoma    started in 40's  . Low back pain    since 40's  . Migraine    started in 50's  . Osteopenia    2014  . Seasonal allergies    to Dust, mold and pollen.  . Thrombocytosis ([name]) September 2015  . Tick bites    she likes out door activities, 2 this year (2015)    SURGICAL HISTORY: Past Surgical History:  Procedure Laterality Date  . CATARACT EXTRACTION Left 02/07/2013  . TOTAL ABDOMINAL HYSTERECTOMY  1970's  . TUBAL LIGATION      I have reviewed the social history and family history with the patient and they are unchanged from previous note.  ALLERGIES:  is allergic to bee pollen; pollen extract; and sulfamethoxazole.  MEDICATIONS:  Current Outpatient Medications  Medication Sig Dispense Refill  . aspirin 81 MG tablet Take 81 mg by mouth daily.    Marland Kitchen azelastine (ASTELIN) 0.1 % nasal spray Place 1 spray into the nose 2 (two) times daily as needed for allergies.    . bacitracin ophthalmic ointment Apply 1 application to eye at bedtime.    . dorzolamidel-timolol (COSOPT) 22.3-6.8 MG/ML SOLN ophthalmic solution     . hydroxyurea (HYDREA) 500 MG capsule TAKE ONE CAPSULE BY MOUTH DAILY WITH FOOD TO MINIMIZE GI SIDE EFFECTS (Patient taking differently: Take by mouth. TAKE ONE CAPSULE BY MOUTH DAILY WITH FOOD TO MINIMIZE GI SIDE EFFECTS  M - F .  Nothing on  Sat &  Sun.) 90 capsule 1  . Latanoprostene Bunod 0.024 %  SOLN     . naproxen sodium (ALEVE) 220 MG tablet Take by mouth.    Mckinley Jewel Dimesylate (RHOPRESSA) 0.02 % SOLN INT 1 GTT INTO OU QD    . Probiotic Product (PROBIOTIC DAILY PO) Take 1 tablet by mouth daily.    . SUMAtriptan (IMITREX) 100 MG tablet Take 100 mg by mouth every 2 (two) hours as needed for migraine or headache. May repeat in 2 hours if headache persists or recurs.     No current facility-administered medications for this visit.    PHYSICAL EXAMINATION: ECOG  PERFORMANCE STATUS: 1 - Symptomatic but completely ambulatory  Vitals:   12/25/18 0928  BP: (!) 143/72  Pulse: 66  Resp: 18  Temp: 97.9 F (36.6 C)  SpO2: 100%   Filed Weights   12/25/18 0928  Weight: 135 lb 4.8 oz (61.4 kg)    GENERAL:alert, no distress and comfortable SKIN: skin color, texture, turgor are normal, no rashes or significant lesions EYES: normal, Conjunctiva are pink and non-injected, sclera clear  NECK: supple, thyroid normal size, non-tender, without nodularity LYMPH:  no palpable lymphadenopathy in the cervical, axillary  LUNGS: clear to auscultation and percussion with normal breathing effort HEART: regular rate & rhythm and no murmurs and no lower extremity edema ABDOMEN:abdomen soft, non-tender and normal bowel sounds Musculoskeletal:no cyanosis of digits and no clubbing  NEURO: alert & oriented x 3 with fluent speech, no focal motor/sensory deficits  LABORATORY DATA:  I have reviewed the data as listed CBC Latest Ref Rng & Units 12/25/2018 09/05/2018 07/06/2018  WBC 4.0 - 10.5 K/uL 8.3 6.8 5.8  Hemoglobin 12.0 - 15.0 g/dL 10.9(L) 10.9(L) 10.9(L)  Hematocrit 36.0 - 46.0 % 34.9(L) 35.1(L) 34.7(L)  Platelets 150 - 400 K/uL 329 324 293     CMP Latest Ref Rng & Units 12/25/2018 09/05/2018 04/06/2018  Glucose 70 - 99 mg/dL 98 103(H) 100(H)  BUN 8 - 23 mg/dL 25(H) 20 24(H)  Creatinine 0.44 - 1.00 mg/dL 0.88 0.91 0.79  Sodium 135 - 145 mmol/L 142 142 140  Potassium 3.5 - 5.1 mmol/L 4.3 3.9 4.2  Chloride 98 - 111 mmol/L 109 110 107  CO2 22 - 32 mmol/L 26 24 24   Calcium 8.9 - 10.3 mg/dL 9.1 9.2 9.5  Total Protein 6.5 - 8.1 g/dL 6.8 7.0 7.1  Total Bilirubin 0.3 - 1.2 mg/dL 0.5 0.4 0.5  Alkaline Phos 38 - 126 U/L 49 49 55  AST 15 - 41 U/L 17 15 15   ALT 0 - 44 U/L 14 17 13       RADIOGRAPHIC STUDIES: I have personally reviewed the radiological images as listed and agreed with the findings in the report. No results found.   ASSESSMENT & PLAN:  Chelsea Garcia is a 85 y.o. female with   1. Essential thrombocytosis, JAK2(+) -Diagnosed in 11/2013. Due to her advanced age, she is at high risk for thrombosis secondary to ET. We previously discussed the goal of therapy is palliative, and prevent thrombosis. Unfortunately it is not curable, and there is a small risk in the involved to acute leukemia and myelofibrosis down the road. -She has been taking hydrea since 2015, currently on 531m MWF due to anemia in 2020. Continue aspirin 81 mg once daily. -She is overall clinically doing well and stable. Labs reviewed, Her anemia has not improved with lower dose Hydrea. I will further reduce her hydrea to 5079mon Mondays and Thursday for the next 6 weeks. If her plt remains in normal range  on next lab, I will reduce to once a week.  -I discussed the possibility of her ET envolving to myelofibrosis leukemia, so far her WBC has been normal, if the concerns arises in future, I may recommend repeating bone marrow biopsy to further evaluate.  -will monitor her labs in 6 weeks. F/u in 12 weeks.   2.IBS She had LLQ in 2019 which she attributed to IBS.  -Her CT CAP from 03/2017 showed diverticulosis and moderate splenomegaly. -Pain has resolved. Her IBS is overall mild and manageable. Stable   PLAN: -Reduce Hydea to 538m on Mondays and Thursdays -Lab in 6 and 12 weeks. If his platelet count remained to be in normal range, and anemia does not improve on next lab, I will reduce her Hydrea to 500 mg once a week -f/u in 12 weeks    No problem-specific Assessment & Plan notes found for this encounter.   No orders of the defined types were placed in this encounter.  All questions were answered. The patient knows to call the clinic with any problems, questions or concerns. No barriers to learning was detected. I spent 10 minutes counseling the patient face to face. The total time spent in the appointment was 15 minutes and more than 50% was on counseling and  review of test results     YTruitt Merle MD 12/25/2018   I, AJoslyn Devon am acting as scribe for YTruitt Merle MD.   I have reviewed the above documentation for accuracy and completeness, and I agree with the above.

## 2018-12-25 ENCOUNTER — Encounter: Payer: Self-pay | Admitting: Hematology

## 2018-12-25 ENCOUNTER — Inpatient Hospital Stay (HOSPITAL_BASED_OUTPATIENT_CLINIC_OR_DEPARTMENT_OTHER): Payer: Medicare Other | Admitting: Hematology

## 2018-12-25 ENCOUNTER — Inpatient Hospital Stay: Payer: Medicare Other | Attending: Hematology

## 2018-12-25 ENCOUNTER — Other Ambulatory Visit: Payer: Self-pay

## 2018-12-25 VITALS — BP 143/72 | HR 66 | Temp 97.9°F | Resp 18 | Ht 62.0 in | Wt 135.3 lb

## 2018-12-25 DIAGNOSIS — D649 Anemia, unspecified: Secondary | ICD-10-CM | POA: Insufficient documentation

## 2018-12-25 DIAGNOSIS — Z7982 Long term (current) use of aspirin: Secondary | ICD-10-CM | POA: Diagnosis not present

## 2018-12-25 DIAGNOSIS — D473 Essential (hemorrhagic) thrombocythemia: Secondary | ICD-10-CM | POA: Diagnosis not present

## 2018-12-25 DIAGNOSIS — Z79899 Other long term (current) drug therapy: Secondary | ICD-10-CM | POA: Diagnosis not present

## 2018-12-25 DIAGNOSIS — H409 Unspecified glaucoma: Secondary | ICD-10-CM | POA: Insufficient documentation

## 2018-12-25 DIAGNOSIS — K589 Irritable bowel syndrome without diarrhea: Secondary | ICD-10-CM | POA: Insufficient documentation

## 2018-12-25 DIAGNOSIS — M8580 Other specified disorders of bone density and structure, unspecified site: Secondary | ICD-10-CM | POA: Insufficient documentation

## 2018-12-25 LAB — COMPREHENSIVE METABOLIC PANEL
ALT: 14 U/L (ref 0–44)
AST: 17 U/L (ref 15–41)
Albumin: 4.2 g/dL (ref 3.5–5.0)
Alkaline Phosphatase: 49 U/L (ref 38–126)
Anion gap: 7 (ref 5–15)
BUN: 25 mg/dL — ABNORMAL HIGH (ref 8–23)
CO2: 26 mmol/L (ref 22–32)
Calcium: 9.1 mg/dL (ref 8.9–10.3)
Chloride: 109 mmol/L (ref 98–111)
Creatinine, Ser: 0.88 mg/dL (ref 0.44–1.00)
GFR calc Af Amer: >60 mL/min (ref >60)
GFR calc non Af Amer: >60 mL/min (ref >60)
Glucose, Bld: 98 mg/dL (ref 70–99)
Potassium: 4.3 mmol/L (ref 3.5–5.1)
Sodium: 142 mmol/L (ref 135–145)
Total Bilirubin: 0.5 mg/dL (ref 0.3–1.2)
Total Protein: 6.8 g/dL (ref 6.5–8.1)

## 2018-12-25 LAB — CBC WITH DIFFERENTIAL/PLATELET
Abs Immature Granulocytes: 0.16 10*3/uL — ABNORMAL HIGH (ref 0.00–0.07)
Basophils Absolute: 0.1 10*3/uL (ref 0.0–0.1)
Basophils Relative: 1 %
Eosinophils Absolute: 0.1 10*3/uL (ref 0.0–0.5)
Eosinophils Relative: 1 %
HCT: 34.9 % — ABNORMAL LOW (ref 36.0–46.0)
Hemoglobin: 10.9 g/dL — ABNORMAL LOW (ref 12.0–15.0)
Immature Granulocytes: 2 %
Lymphocytes Relative: 9 %
Lymphs Abs: 0.7 10*3/uL (ref 0.7–4.0)
MCH: 29.4 pg (ref 26.0–34.0)
MCHC: 31.2 g/dL (ref 30.0–36.0)
MCV: 94.1 fL (ref 80.0–100.0)
Monocytes Absolute: 0.4 10*3/uL (ref 0.1–1.0)
Monocytes Relative: 5 %
Neutro Abs: 6.9 10*3/uL (ref 1.7–7.7)
Neutrophils Relative %: 82 %
Platelets: 329 10*3/uL (ref 150–400)
RBC: 3.71 MIL/uL — ABNORMAL LOW (ref 3.87–5.11)
RDW: 17.4 % — ABNORMAL HIGH (ref 11.5–15.5)
WBC: 8.3 10*3/uL (ref 4.0–10.5)
nRBC: 0 % (ref 0.0–0.2)

## 2018-12-26 ENCOUNTER — Telehealth: Payer: Self-pay | Admitting: Hematology

## 2018-12-26 NOTE — Telephone Encounter (Signed)
Scheduled appt per 12/15 los.  Printed and mailed appt calendar

## 2019-01-01 ENCOUNTER — Telehealth: Payer: Self-pay | Admitting: Hematology

## 2019-01-01 NOTE — Telephone Encounter (Signed)
Returned patient's phone call regarding rescheduling appointment time for January and March appointments, per patient's request appointment time has been rescheduled.

## 2019-01-28 DIAGNOSIS — H1789 Other corneal scars and opacities: Secondary | ICD-10-CM | POA: Diagnosis not present

## 2019-01-28 DIAGNOSIS — H52203 Unspecified astigmatism, bilateral: Secondary | ICD-10-CM | POA: Diagnosis not present

## 2019-01-28 DIAGNOSIS — H532 Diplopia: Secondary | ICD-10-CM | POA: Diagnosis not present

## 2019-01-28 DIAGNOSIS — H401133 Primary open-angle glaucoma, bilateral, severe stage: Secondary | ICD-10-CM | POA: Diagnosis not present

## 2019-02-05 ENCOUNTER — Inpatient Hospital Stay: Payer: Medicare Other

## 2019-02-05 ENCOUNTER — Other Ambulatory Visit: Payer: Medicare Other

## 2019-02-07 ENCOUNTER — Telehealth: Payer: Self-pay | Admitting: Hematology

## 2019-02-07 NOTE — Telephone Encounter (Signed)
Rescheduled per 1/26 sch msg. pts req. Called and left a msg. Mailing printout

## 2019-02-14 ENCOUNTER — Other Ambulatory Visit: Payer: Self-pay

## 2019-02-14 ENCOUNTER — Inpatient Hospital Stay: Payer: Medicare Other | Attending: Hematology

## 2019-02-14 DIAGNOSIS — D473 Essential (hemorrhagic) thrombocythemia: Secondary | ICD-10-CM | POA: Diagnosis not present

## 2019-02-14 LAB — CBC WITH DIFFERENTIAL/PLATELET
Abs Immature Granulocytes: 0.19 10*3/uL — ABNORMAL HIGH (ref 0.00–0.07)
Basophils Absolute: 0.1 10*3/uL (ref 0.0–0.1)
Basophils Relative: 1 %
Eosinophils Absolute: 0.1 10*3/uL (ref 0.0–0.5)
Eosinophils Relative: 1 %
HCT: 37.7 % (ref 36.0–46.0)
Hemoglobin: 11.5 g/dL — ABNORMAL LOW (ref 12.0–15.0)
Immature Granulocytes: 2 %
Lymphocytes Relative: 9 %
Lymphs Abs: 0.8 10*3/uL (ref 0.7–4.0)
MCH: 28.3 pg (ref 26.0–34.0)
MCHC: 30.5 g/dL (ref 30.0–36.0)
MCV: 92.9 fL (ref 80.0–100.0)
Monocytes Absolute: 0.5 10*3/uL (ref 0.1–1.0)
Monocytes Relative: 5 %
Neutro Abs: 7.1 10*3/uL (ref 1.7–7.7)
Neutrophils Relative %: 82 %
Platelets: 356 10*3/uL (ref 150–400)
RBC: 4.06 MIL/uL (ref 3.87–5.11)
RDW: 17.2 % — ABNORMAL HIGH (ref 11.5–15.5)
WBC: 8.7 10*3/uL (ref 4.0–10.5)
nRBC: 0 % (ref 0.0–0.2)

## 2019-02-20 ENCOUNTER — Telehealth: Payer: Self-pay

## 2019-02-20 NOTE — Telephone Encounter (Signed)
-----   Message from Truitt Merle, MD sent at 02/16/2019 12:43 PM EST ----- Please let pt know her CBC result, anemia improved, plt is controlled, continue current dose hydrea, thanks   Truitt Merle

## 2019-02-20 NOTE — Telephone Encounter (Signed)
Unable to leave vm.

## 2019-02-21 NOTE — Telephone Encounter (Signed)
I spoke with Ms Champ Mungo,  I let her know her CBC results, anemia improved and plt controlled.  I instructed her to continue hydrea at the same dose.  She verbalized understanding.

## 2019-03-11 NOTE — Progress Notes (Signed)
[place]   Telephone:(336) [phone] Fax:(336) [phone]   Clinic Follow up Note   Patient Care Team: Lujean Amel, MD as PCP - General (Family Medicine) Bernadene Bell, MD (Inactive) as Consulting Physician (Hematology)  Date of Service:  03/18/2019  CHIEF COMPLAINT: Essential Thrombocytosis  SUMMARY OF ONCOLOGIC HISTORY: Oncology History  Essential thrombocytosis ([place])  10/01/2013 Initial Diagnosis   Essential thrombocytosis     CURRENT THERAPY:  -Hydroxyuria 511m daily started 11/27/2013. Decreased to 5090mM-F in 12/2017, further decrease to 50014mWF on 07/06/2018. Reduced to 500m35m 2 days a week starting 12/25/18. Reduced to 500mg21me weekly starting 03/18/19.  -Aspirin 81mg 49my  INTERVAL HISTORY:  Chelsea GVera Furnissre for a follow up of ET. She presents to the clinic alone. She notes she is doing well. She notes she is currently taking hydrea twice a week. She is managing well.      REVIEW OF SYSTEMS:   Constitutional: Denies fevers, chills or abnormal weight loss Eyes: Denies blurriness of vision Ears, nose, mouth, throat, and face: Denies mucositis or sore throat Respiratory: Denies cough, dyspnea or wheezes Cardiovascular: Denies palpitation, chest discomfort or lower extremity swelling Gastrointestinal:  Denies nausea, heartburn or change in bowel habits Skin: Denies abnormal skin rashes Lymphatics: Denies new lymphadenopathy or easy bruising Neurological:Denies numbness, tingling or new weaknesses Behavioral/Psych: Mood is stable, no new changes  All other systems were reviewed with the patient and are negative.  MEDICAL HISTORY:  Past Medical History:  Diagnosis Date  . Colon polyps    Benign. 2 colonoscopies in past. Due now again.  . Endometriosis    required TAH when young  . Essential tremor    since 40's  . Glaucoma    started in 40's  . Low back pain    since 40's  . Migraine    started in 50's  . Osteopenia    2014  .  Seasonal allergies    to Dust, mold and pollen.  . [place]  . Tick bites    she likes out door activities, 2 this year (2015)    SURGICAL HISTORY: Past Surgical History:  Procedure Laterality Date  . CATARACT EXTRACTION Left 02/07/2013  . TOTAL ABDOMINAL HYSTERECTOMY  1970's  . TUBAL LIGATION      I have reviewed the social history and family history with the patient and they are unchanged from previous note.  ALLERGIES:  is allergic to bee pollen; pollen extract; and sulfamethoxazole.  MEDICATIONS:  Current Outpatient Medications  Medication Sig Dispense Refill  . aspirin 81 MG tablet Take 81 mg by mouth daily.    . azelMarland Kitchenstine (ASTELIN) 0.1 % nasal spray Place 1 spray into the nose 2 (two) times daily as needed for allergies.    . dorzMarland Kitchenlamidel-timolol (COSOPT) 22.3-6.8 MG/ML SOLN ophthalmic solution     . hydroxyurea (HYDREA) 500 MG capsule TAKE ONE CAPSULE BY MOUTH DAILY WITH FOOD TO MINIMIZE GI SIDE EFFECTS (Patient taking differently: Take by mouth 2 (two) times a week. TAKE ONE CAPSULE BY MOUTH 2 times weekly WITH FOOD TO MINIMIZE GI SIDE EFFECTS) 90 capsule 1  . Latanoprostene Bunod 0.024 % SOLN     . naproxen sodium (ALEVE) 220 MG tablet Take by mouth.    . NetaMckinley Jewelylate (RHOPRESSA) 0.02 % SOLN INT 1 GTT INTO OU QD    . Probiotic Product (PROBIOTIC DAILY PO) Take 1 tablet by mouth daily.    . SUMAtriptan (IMITREX) 100 MG tablet  Take 100 mg by mouth every 2 (two) hours as needed for migraine or headache. May repeat in 2 hours if headache persists or recurs.     No current facility-administered medications for this visit.    PHYSICAL EXAMINATION: ECOG PERFORMANCE STATUS: 1 - Symptomatic but completely ambulatory  Vitals:   03/18/19 1008  BP: 136/61  Pulse: 68  Resp: 16  Temp: 98.2 F (36.8 C)  SpO2: 100%   Filed Weights   03/18/19 1008  Weight: 134 lb 8 oz (61 kg)   Due to COVID19 we will limit examination to appearance.  Patient had no complaints.  GENERAL:alert, no distress and comfortable SKIN: skin color normal, no rashes or significant lesions EYES: normal, Conjunctiva are pink and non-injected, sclera clear  NEURO: alert & oriented x 3 with fluent speech   LABORATORY DATA:  I have reviewed the data as listed CBC Latest Ref Rng & Units 03/18/2019 02/14/2019 12/25/2018  WBC 4.0 - 10.5 K/uL 8.8 8.7 8.3  Hemoglobin 12.0 - 15.0 g/dL 11.0(L) 11.5(L) 10.9(L)  Hematocrit 36.0 - 46.0 % 36.1 37.7 34.9(L)  Platelets 150 - 400 K/uL 395 356 329     CMP Latest Ref Rng & Units 12/25/2018 09/05/2018 04/06/2018  Glucose 70 - 99 mg/dL 98 103(H) 100(H)  BUN 8 - 23 mg/dL 25(H) 20 24(H)  Creatinine 0.44 - 1.00 mg/dL 0.88 0.91 0.79  Sodium 135 - 145 mmol/L 142 142 140  Potassium 3.5 - 5.1 mmol/L 4.3 3.9 4.2  Chloride 98 - 111 mmol/L 109 110 107  CO2 22 - 32 mmol/L 26 24 24   Calcium 8.9 - 10.3 mg/dL 9.1 9.2 9.5  Total Protein 6.5 - 8.1 g/dL 6.8 7.0 7.1  Total Bilirubin 0.3 - 1.2 mg/dL 0.5 0.4 0.5  Alkaline Phos 38 - 126 U/L 49 49 55  AST 15 - 41 U/L 17 15 15   ALT 0 - 44 U/L 14 17 13       RADIOGRAPHIC STUDIES: I have personally reviewed the radiological images as listed and agreed with the findings in the report. No results found.   ASSESSMENT & PLAN:  Chelsea Garcia is a 86 y.o. female with   1. Essential thrombocytosis, JAK2(+) -Diagnosed in 11/2013 with plt initially in 600 range. Due to her advanced age, she is at high risk for thrombosis secondary to ET. We previously discussed the goal of therapy is palliative, and prevent thrombosis. Unfortunately it is not curable, and there is a small risk in the involved to acute leukemia and myelofibrosis down the road. -I previously discussed the possibility of her ET evolving to myelofibrosis leukemia, so far her WBC has been normal, if the concerns arises in future, I may recommend repeating bone marrow biopsy to further evaluate.  -She has been taking hydreasince  2015, currently on 542m Monday and Thursdays due to anemia. Continue aspirin 81 mg once daily. -She is clinically doing well. She continues to tolerate Hydrea twice weekly. Labs reviewed, Hg 11, platelets normal. Given Anemia has not completely recovered and plt are normal, will reduce Hydrea to 5024monce weekly. She is agreeable.  -If her platelet count remained to be in normal range, and anemia does not improve on next lab, I will hold hydrea.  -will monitor her labs in 2 months and f/u in 4 months.  -I encouraged her to remain active at home.    2.IBS She had LLQ in 2019 which she attributed to IBS.  -Her CT CAP from 03/2017 showed diverticulosis and moderate  splenomegaly. -Pain has resolved. Her IBS is overall mild and manageable. Stable    PLAN: -Reduce Hydrea to 563m once weekly  -Lab in 2 months -Lab and F/u in 4 months     No problem-specific Assessment & Plan notes found for this encounter.   No orders of the defined types were placed in this encounter.  All questions were answered. The patient knows to call the clinic with any problems, questions or concerns. No barriers to learning was detected. The total time spent in the appointment was 20 minutes.     YTruitt Merle MD 03/18/2019   I, AJoslyn Devon am acting as scribe for YTruitt Merle MD.   I have reviewed the above documentation for accuracy and completeness, and I agree with the above.

## 2019-03-18 ENCOUNTER — Ambulatory Visit: Payer: Medicare Other | Admitting: Hematology

## 2019-03-18 ENCOUNTER — Inpatient Hospital Stay: Payer: Medicare Other | Attending: Hematology

## 2019-03-18 ENCOUNTER — Other Ambulatory Visit: Payer: Medicare Other

## 2019-03-18 ENCOUNTER — Inpatient Hospital Stay (HOSPITAL_BASED_OUTPATIENT_CLINIC_OR_DEPARTMENT_OTHER): Payer: Medicare Other | Admitting: Hematology

## 2019-03-18 ENCOUNTER — Encounter: Payer: Self-pay | Admitting: Hematology

## 2019-03-18 ENCOUNTER — Other Ambulatory Visit: Payer: Self-pay

## 2019-03-18 VITALS — BP 136/61 | HR 68 | Temp 98.2°F | Resp 16 | Ht 62.0 in | Wt 134.5 lb

## 2019-03-18 DIAGNOSIS — K589 Irritable bowel syndrome without diarrhea: Secondary | ICD-10-CM | POA: Diagnosis not present

## 2019-03-18 DIAGNOSIS — D473 Essential (hemorrhagic) thrombocythemia: Secondary | ICD-10-CM | POA: Diagnosis not present

## 2019-03-18 DIAGNOSIS — Z7982 Long term (current) use of aspirin: Secondary | ICD-10-CM | POA: Insufficient documentation

## 2019-03-18 DIAGNOSIS — Z79899 Other long term (current) drug therapy: Secondary | ICD-10-CM | POA: Insufficient documentation

## 2019-03-18 LAB — CBC WITH DIFFERENTIAL/PLATELET
Abs Immature Granulocytes: 0.28 10*3/uL — ABNORMAL HIGH (ref 0.00–0.07)
Basophils Absolute: 0.1 10*3/uL (ref 0.0–0.1)
Basophils Relative: 1 %
Eosinophils Absolute: 0.1 10*3/uL (ref 0.0–0.5)
Eosinophils Relative: 1 %
HCT: 36.1 % (ref 36.0–46.0)
Hemoglobin: 11 g/dL — ABNORMAL LOW (ref 12.0–15.0)
Immature Granulocytes: 3 %
Lymphocytes Relative: 8 %
Lymphs Abs: 0.7 10*3/uL (ref 0.7–4.0)
MCH: 28.3 pg (ref 26.0–34.0)
MCHC: 30.5 g/dL (ref 30.0–36.0)
MCV: 92.8 fL (ref 80.0–100.0)
Monocytes Absolute: 0.5 10*3/uL (ref 0.1–1.0)
Monocytes Relative: 6 %
Neutro Abs: 7.2 10*3/uL (ref 1.7–7.7)
Neutrophils Relative %: 81 %
Platelets: 395 10*3/uL (ref 150–400)
RBC: 3.89 MIL/uL (ref 3.87–5.11)
RDW: 17.4 % — ABNORMAL HIGH (ref 11.5–15.5)
WBC: 8.8 10*3/uL (ref 4.0–10.5)
nRBC: 0 % (ref 0.0–0.2)

## 2019-03-19 ENCOUNTER — Telehealth: Payer: Self-pay | Admitting: Hematology

## 2019-03-19 NOTE — Telephone Encounter (Signed)
Scheduled appt per 3/8 los.  Sent a message to HIM pool to get a calendar mailed out.

## 2019-04-16 DIAGNOSIS — M85851 Other specified disorders of bone density and structure, right thigh: Secondary | ICD-10-CM | POA: Diagnosis not present

## 2019-04-16 DIAGNOSIS — R197 Diarrhea, unspecified: Secondary | ICD-10-CM | POA: Diagnosis not present

## 2019-04-16 DIAGNOSIS — M85852 Other specified disorders of bone density and structure, left thigh: Secondary | ICD-10-CM | POA: Diagnosis not present

## 2019-05-06 DIAGNOSIS — R946 Abnormal results of thyroid function studies: Secondary | ICD-10-CM | POA: Diagnosis not present

## 2019-05-17 ENCOUNTER — Inpatient Hospital Stay: Payer: Medicare Other | Attending: Hematology

## 2019-05-17 ENCOUNTER — Other Ambulatory Visit: Payer: Self-pay

## 2019-05-17 DIAGNOSIS — D473 Essential (hemorrhagic) thrombocythemia: Secondary | ICD-10-CM | POA: Insufficient documentation

## 2019-05-17 LAB — CBC WITH DIFFERENTIAL/PLATELET
Abs Immature Granulocytes: 0.35 10*3/uL — ABNORMAL HIGH (ref 0.00–0.07)
Basophils Absolute: 0.1 10*3/uL (ref 0.0–0.1)
Basophils Relative: 1 %
Eosinophils Absolute: 0.1 10*3/uL (ref 0.0–0.5)
Eosinophils Relative: 1 %
HCT: 36.3 % (ref 36.0–46.0)
Hemoglobin: 11 g/dL — ABNORMAL LOW (ref 12.0–15.0)
Immature Granulocytes: 4 %
Lymphocytes Relative: 8 %
Lymphs Abs: 0.7 10*3/uL (ref 0.7–4.0)
MCH: 27.7 pg (ref 26.0–34.0)
MCHC: 30.3 g/dL (ref 30.0–36.0)
MCV: 91.4 fL (ref 80.0–100.0)
Monocytes Absolute: 0.5 10*3/uL (ref 0.1–1.0)
Monocytes Relative: 5 %
Neutro Abs: 7.1 10*3/uL (ref 1.7–7.7)
Neutrophils Relative %: 81 %
Platelets: 415 10*3/uL — ABNORMAL HIGH (ref 150–400)
RBC: 3.97 MIL/uL (ref 3.87–5.11)
RDW: 17.8 % — ABNORMAL HIGH (ref 11.5–15.5)
WBC: 8.8 10*3/uL (ref 4.0–10.5)
nRBC: 0.2 % (ref 0.0–0.2)

## 2019-05-22 ENCOUNTER — Telehealth: Payer: Self-pay | Admitting: Emergency Medicine

## 2019-05-22 NOTE — Telephone Encounter (Signed)
-----   Message from Truitt Merle, MD sent at 05/21/2019  7:39 PM EDT ----- Please let pt know her CBC result from last week, plt is controlled, continue current dose hydrea, thanks  Truitt Merle

## 2019-05-22 NOTE — Telephone Encounter (Signed)
Contacted pt regarding lab results and recommendations per MD Burr Medico, pt verbalized understanding and denies any further questions/concerns at this time.

## 2019-05-27 DIAGNOSIS — S61439A Puncture wound without foreign body of unspecified hand, initial encounter: Secondary | ICD-10-CM | POA: Diagnosis not present

## 2019-06-04 ENCOUNTER — Ambulatory Visit (INDEPENDENT_AMBULATORY_CARE_PROVIDER_SITE_OTHER): Payer: Medicare Other | Admitting: Podiatry

## 2019-06-04 ENCOUNTER — Other Ambulatory Visit: Payer: Self-pay

## 2019-06-04 ENCOUNTER — Encounter: Payer: Self-pay | Admitting: Podiatry

## 2019-06-04 VITALS — Temp 96.8°F

## 2019-06-04 DIAGNOSIS — M79676 Pain in unspecified toe(s): Secondary | ICD-10-CM

## 2019-06-04 DIAGNOSIS — B351 Tinea unguium: Secondary | ICD-10-CM | POA: Diagnosis not present

## 2019-06-04 DIAGNOSIS — M79609 Pain in unspecified limb: Secondary | ICD-10-CM

## 2019-06-04 NOTE — Progress Notes (Signed)
This patient returns to the office for evaluation and treatment of long thick painful nails .  This patient is unable to trim his own nails since the patient cannot reach his feet.  Patient says the nails are painful walking and wearing his shoes.  He returns for preventive foot care services.  General Appearance  Alert, conversant and in no acute stress.  Vascular  Dorsalis pedis and posterior tibial  pulses are palpable  bilaterally.  Capillary return is within normal limits  bilaterally. Temperature is within normal limits  bilaterally.  Neurologic  Senn-Weinstein monofilament wire test within normal limits  bilaterally. Muscle power within normal limits bilaterally.  Nails Thick disfigured discolored nails with subungual debris  from hallux to fifth toes bilaterally. No evidence of bacterial infection or drainage bilaterally.  Orthopedic  No limitations of motion  feet .  No crepitus or effusions noted.  No bony pathology or digital deformities noted.  Skin  normotropic skin with no porokeratosis noted bilaterally.  No signs of infections or ulcers noted.     Onychomycosis  Pain in toes right foot  Pain in toes left foot  Debridement  of nails  1-5  B/L with a nail nipper.  Nails were then filed using a dremel tool with no incidents.    RTC  12 weeks    Leilyn Frayre DPM  

## 2019-07-10 NOTE — Progress Notes (Signed)
Chelsea Garcia   [phone] [phone]) [phone]   Clinic Follow up Note   Patient Care Team: Lujean Amel, MD as PCP - General (Family Medicine) Bernadene Bell, MD (Inactive) as Consulting Physician (Hematology)  Date of Service:  07/17/2019  CHIEF COMPLAINT: F/u of Essential Thrombocytosis  SUMMARY OF ONCOLOGIC HISTORY: Oncology History  Essential thrombocytosis ([place])  10/01/2013 Initial Diagnosis   Essential thrombocytosis      CURRENT THERAPY:  -Hydroxyuria 568m daily started 11/27/2013. Decreased to 5029mM-F in 12/2017, further decrease to 50052mWF on 07/06/2018. Reduced to 500m60m 2 days a week starting 12/25/18.Reduced to 500mg39me weekly starting 03/18/19.  -Aspirin 81mg 22my  INTERVAL HISTORY:  Chelsea GAideliz Garcia for a follow up of ET. She presents to the clinic alone. She notes she is doing well. She notes she has acid reflux and is currently taking something for this. She is currently on Hydrea 500mg o6mweekly and tolerating well. She notes on low dose she has less IBS.     REVIEW OF SYSTEMS:   Constitutional: Denies fevers, chills or abnormal weight loss Eyes: Denies blurriness of vision Ears, nose, mouth, throat, and face: Denies mucositis or sore throat Respiratory: Denies cough, dyspnea or wheezes Cardiovascular: Denies palpitation, chest discomfort or lower extremity swelling Gastrointestinal:  Denies nausea, heartburn or change in bowel habits Skin: Denies abnormal skin rashes Lymphatics: Denies new lymphadenopathy or easy bruising Neurological:Denies numbness, tingling or new weaknesses Behavioral/Psych: Mood is stable, no new changes  All other systems were reviewed with the patient and are negative.  MEDICAL HISTORY:  Past Medical History:  Diagnosis Date  . Colon polyps    Benign. 2 colonoscopies in past. Due now again.  . Endometriosis    required TAH when young  . Essential tremor    since 40's  . Glaucoma     started in 40's  . Low back pain    since 40's  . Migraine    started in 50's  . Osteopenia    2014  . Seasonal allergies    to Dust, mold and pollen.  . Thrombocytosis ([facility]) [place]  . Tick bites    she likes out door activities, 2 this year (2015)    SURGICAL HISTORY: Past Surgical History:  Procedure Laterality Date  . CATARACT EXTRACTION Left 02/07/2013  . TOTAL ABDOMINAL HYSTERECTOMY  1970's  . TUBAL LIGATION      I have reviewed the social history and family history with the patient and they are unchanged from previous note.  ALLERGIES:  is allergic to bee pollen, pollen extract, and sulfamethoxazole.  MEDICATIONS:  Current Outpatient Medications  Medication Sig Dispense Refill  . hydroxyurea (HYDREA) 500 MG capsule Take 500 mg by mouth once a week. May take with food to minimize GI side effects.    . aspirMarland Kitchenn 81 MG tablet Take 81 mg by mouth daily.    . azelaMarland Kitchentine (ASTELIN) 0.1 % nasal spray Place 1 spray into the nose 2 (two) times daily as needed for allergies.    . dorzoMarland Kitchenamidel-timolol (COSOPT) 22.3-6.8 MG/ML SOLN ophthalmic solution     . Latanoprostene Bunod 0.024 % SOLN     . naproxen sodium (ALEVE) 220 MG tablet Take by mouth.    . NetarMckinley Jewellate (RHOPRESSA) 0.02 % SOLN INT 1 GTT INTO OU QD    . omeprazole (PRILOSEC) 40 MG capsule Take 40 mg by mouth every morning.    . Probiotic Product (PROBIOTIC DAILY PO) Take 1  tablet by mouth daily.    . SUMAtriptan (IMITREX) 100 MG tablet Take 100 mg by mouth every 2 (two) hours as needed for migraine or headache. May repeat in 2 hours if headache persists or recurs.     No current facility-administered medications for this visit.    PHYSICAL EXAMINATION: ECOG PERFORMANCE STATUS: 0 - Asymptomatic  Vitals:   07/17/19 1115  BP: (!) 123/55  Pulse: 63  Resp: 18  Temp: 98.9 F (37.2 C)  SpO2: 99%   Filed Weights   07/17/19 1115  Weight: 129 lb (58.5 kg)    Due to COVID19 we will limit examination  to appearance. Patient had no complaints.  GENERAL:alert, no distress and comfortable SKIN: skin color normal, no rashes or significant lesions EYES: normal, Conjunctiva are pink and non-injected, sclera clear  NEURO: alert & oriented x 3 with fluent speech   LABORATORY DATA:  I have reviewed the data as listed CBC Latest Ref Rng & Units 07/17/2019 05/17/2019 03/18/2019  WBC 4.0 - 10.5 K/uL 8.8 8.8 8.8  Hemoglobin 12.0 - 15.0 g/dL 10.8(L) 11.0(L) 11.0(L)  Hematocrit 36 - 46 % 35.3(L) 36.3 36.1  Platelets 150 - 400 K/uL 382 415(H) 395     CMP Latest Ref Rng & Units 07/17/2019 12/25/2018 09/05/2018  Glucose 70 - 99 mg/dL 90 98 103(H)  BUN 8 - 23 mg/dL 16 25(H) 20  Creatinine 0.44 - 1.00 mg/dL 0.92 0.88 0.91  Sodium 135 - 145 mmol/L 140 142 142  Potassium 3.5 - 5.1 mmol/L 4.1 4.3 3.9  Chloride 98 - 111 mmol/L 110 109 110  CO2 22 - 32 mmol/L _0 Calcium 8.9 - 10.3 mg/dL 9.3 9.1 9.2  Total Protein 6.5 - 8.1 g/dL 6.8 6.8 7.0  Total Bilirubin 0.3 - 1.2 mg/dL 0.5 0.5 0.4  Alkaline Phos 38 - 126 U/L 45 49 49  AST 15 - 41 U/L 14(L) 17 15  ALT 0 - 44 U/L _1 RADIOGRAPHIC STUDIES: I have personally reviewed the radiological images as listed and agreed with the findings in the report. No results found.   ASSESSMENT & PLAN:  Chelsea Garcia is a 86 y.o. female with    1. Essential thrombocytosis, JAK2(+) -Diagnosed in 11/2013 with plt initially in 600 range.Due to her advanced age, she is at high risk for thrombosis secondary to ET.We previously discussed the goal of therapy is palliative, and prevent thrombosis. Unfortunately it is not curable, and there is a small risk in the involved to acute leukemia and myelofibrosis down the road. -I previously discussedthe possibility ofher ETevolvingtomyelofibrosis leukemia, so far her WBC has been normal, if the concerns arises in future, I mayrecommend repeatingbone marrow biopsy to further evaluate.  -She has been taking  hydreasince 2015, currently on 598m once weekly due to anemia.Continue aspirin 81 mg once daily. -Labs reviewed, CBC and CMP WNL except Hg 10.8. Plt are normal. Given her labs have improved will continue current dose.  -Repeat labs in 3 months  -f/u in 6 months.   2.IBS, Acid Reflux  -Her CT CAP from 03/2017 showeddiverticulosis and moderate splenomegaly. -Her IBS isoverall mild and manageable on lower dose hydrea.    PLAN: -continue Hydrea to 5041monce weekly  -Lab in 3 months  -Lab and F/u in 6 months    No problem-specific Assessment & Plan notes found for this encounter.   No orders of the defined types were placed in this encounter.  All  questions were answered. The patient knows to call the clinic with any problems, questions or concerns. No barriers to learning was detected. The total time spent in the appointment was 20 minutes.     Truitt Merle, MD 07/17/2019   I, Joslyn Devon, am acting as scribe for Truitt Merle, MD.   I have reviewed the above documentation for accuracy and completeness, and I agree with the above.

## 2019-07-12 ENCOUNTER — Other Ambulatory Visit: Payer: Self-pay | Admitting: Family Medicine

## 2019-07-12 DIAGNOSIS — R059 Cough, unspecified: Secondary | ICD-10-CM

## 2019-07-12 DIAGNOSIS — D75839 Thrombocytosis, unspecified: Secondary | ICD-10-CM

## 2019-07-16 DIAGNOSIS — J302 Other seasonal allergic rhinitis: Secondary | ICD-10-CM | POA: Diagnosis not present

## 2019-07-16 DIAGNOSIS — R09A2 Foreign body sensation, throat: Secondary | ICD-10-CM | POA: Insufficient documentation

## 2019-07-16 DIAGNOSIS — J342 Deviated nasal septum: Secondary | ICD-10-CM | POA: Diagnosis not present

## 2019-07-16 DIAGNOSIS — R198 Other specified symptoms and signs involving the digestive system and abdomen: Secondary | ICD-10-CM | POA: Insufficient documentation

## 2019-07-16 DIAGNOSIS — R0989 Other specified symptoms and signs involving the circulatory and respiratory systems: Secondary | ICD-10-CM | POA: Insufficient documentation

## 2019-07-16 DIAGNOSIS — K219 Gastro-esophageal reflux disease without esophagitis: Secondary | ICD-10-CM | POA: Diagnosis not present

## 2019-07-16 DIAGNOSIS — H6121 Impacted cerumen, right ear: Secondary | ICD-10-CM | POA: Diagnosis not present

## 2019-07-17 ENCOUNTER — Encounter: Payer: Self-pay | Admitting: Hematology

## 2019-07-17 ENCOUNTER — Other Ambulatory Visit: Payer: Self-pay

## 2019-07-17 ENCOUNTER — Inpatient Hospital Stay: Payer: Medicare Other | Attending: Hematology | Admitting: Hematology

## 2019-07-17 ENCOUNTER — Telehealth: Payer: Self-pay | Admitting: Hematology

## 2019-07-17 ENCOUNTER — Inpatient Hospital Stay: Payer: Medicare Other

## 2019-07-17 VITALS — BP 123/55 | HR 63 | Temp 98.9°F | Resp 18 | Wt 129.0 lb

## 2019-07-17 DIAGNOSIS — D473 Essential (hemorrhagic) thrombocythemia: Secondary | ICD-10-CM | POA: Diagnosis not present

## 2019-07-17 DIAGNOSIS — D649 Anemia, unspecified: Secondary | ICD-10-CM | POA: Insufficient documentation

## 2019-07-17 DIAGNOSIS — Z79899 Other long term (current) drug therapy: Secondary | ICD-10-CM | POA: Diagnosis not present

## 2019-07-17 DIAGNOSIS — M8580 Other specified disorders of bone density and structure, unspecified site: Secondary | ICD-10-CM | POA: Diagnosis not present

## 2019-07-17 DIAGNOSIS — G250 Essential tremor: Secondary | ICD-10-CM | POA: Diagnosis not present

## 2019-07-17 DIAGNOSIS — K219 Gastro-esophageal reflux disease without esophagitis: Secondary | ICD-10-CM | POA: Insufficient documentation

## 2019-07-17 DIAGNOSIS — Z7982 Long term (current) use of aspirin: Secondary | ICD-10-CM | POA: Insufficient documentation

## 2019-07-17 DIAGNOSIS — K589 Irritable bowel syndrome without diarrhea: Secondary | ICD-10-CM | POA: Diagnosis not present

## 2019-07-17 LAB — CBC WITH DIFFERENTIAL (CANCER CENTER ONLY)
Abs Immature Granulocytes: 0.38 10*3/uL — ABNORMAL HIGH (ref 0.00–0.07)
Basophils Absolute: 0.1 10*3/uL (ref 0.0–0.1)
Basophils Relative: 1 %
Eosinophils Absolute: 0.1 10*3/uL (ref 0.0–0.5)
Eosinophils Relative: 1 %
HCT: 35.3 % — ABNORMAL LOW (ref 36.0–46.0)
Hemoglobin: 10.8 g/dL — ABNORMAL LOW (ref 12.0–15.0)
Immature Granulocytes: 4 %
Lymphocytes Relative: 6 %
Lymphs Abs: 0.5 10*3/uL — ABNORMAL LOW (ref 0.7–4.0)
MCH: 27.9 pg (ref 26.0–34.0)
MCHC: 30.6 g/dL (ref 30.0–36.0)
MCV: 91.2 fL (ref 80.0–100.0)
Monocytes Absolute: 0.5 10*3/uL (ref 0.1–1.0)
Monocytes Relative: 6 %
Neutro Abs: 7.2 10*3/uL (ref 1.7–7.7)
Neutrophils Relative %: 82 %
Platelet Count: 382 10*3/uL (ref 150–400)
RBC: 3.87 MIL/uL (ref 3.87–5.11)
RDW: 18.7 % — ABNORMAL HIGH (ref 11.5–15.5)
WBC Count: 8.8 10*3/uL (ref 4.0–10.5)
nRBC: 0 % (ref 0.0–0.2)

## 2019-07-17 LAB — COMPREHENSIVE METABOLIC PANEL
ALT: 11 U/L (ref 0–44)
AST: 14 U/L — ABNORMAL LOW (ref 15–41)
Albumin: 4.1 g/dL (ref 3.5–5.0)
Alkaline Phosphatase: 45 U/L (ref 38–126)
Anion gap: 6 (ref 5–15)
BUN: 16 mg/dL (ref 8–23)
CO2: 24 mmol/L (ref 22–32)
Calcium: 9.3 mg/dL (ref 8.9–10.3)
Chloride: 110 mmol/L (ref 98–111)
Creatinine, Ser: 0.92 mg/dL (ref 0.44–1.00)
GFR calc Af Amer: >60 mL/min (ref >60)
GFR calc non Af Amer: 57 mL/min — ABNORMAL LOW (ref >60)
Glucose, Bld: 90 mg/dL (ref 70–99)
Potassium: 4.1 mmol/L (ref 3.5–5.1)
Sodium: 140 mmol/L (ref 135–145)
Total Bilirubin: 0.5 mg/dL (ref 0.3–1.2)
Total Protein: 6.8 g/dL (ref 6.5–8.1)

## 2019-07-17 NOTE — Telephone Encounter (Signed)
Scheduled per 7/7 los. Pt is aware of appt times and dates. No avs or calendar needed to be printed.

## 2019-07-25 ENCOUNTER — Ambulatory Visit
Admission: RE | Admit: 2019-07-25 | Discharge: 2019-07-25 | Disposition: A | Discharge status: Home / Self Care | Payer: Medicare Other | Source: Ambulatory Visit | Attending: Family Medicine | Admitting: Family Medicine

## 2019-07-25 DIAGNOSIS — J984 Other disorders of lung: Secondary | ICD-10-CM | POA: Diagnosis not present

## 2019-07-25 DIAGNOSIS — J479 Bronchiectasis, uncomplicated: Secondary | ICD-10-CM | POA: Diagnosis not present

## 2019-07-25 DIAGNOSIS — R059 Cough, unspecified: Secondary | ICD-10-CM

## 2019-07-25 DIAGNOSIS — I700 Atherosclerosis of aorta: Secondary | ICD-10-CM | POA: Diagnosis not present

## 2019-07-25 DIAGNOSIS — D75839 Thrombocytosis, unspecified: Secondary | ICD-10-CM

## 2019-07-25 DIAGNOSIS — I2510 Atherosclerotic heart disease of native coronary artery without angina pectoris: Secondary | ICD-10-CM | POA: Diagnosis not present

## 2019-08-06 DIAGNOSIS — Z79899 Other long term (current) drug therapy: Secondary | ICD-10-CM | POA: Diagnosis not present

## 2019-08-06 DIAGNOSIS — E038 Other specified hypothyroidism: Secondary | ICD-10-CM | POA: Diagnosis not present

## 2019-08-06 DIAGNOSIS — D649 Anemia, unspecified: Secondary | ICD-10-CM | POA: Diagnosis not present

## 2019-08-06 DIAGNOSIS — R609 Edema, unspecified: Secondary | ICD-10-CM | POA: Diagnosis not present

## 2019-08-16 DIAGNOSIS — H401133 Primary open-angle glaucoma, bilateral, severe stage: Secondary | ICD-10-CM | POA: Diagnosis not present

## 2019-08-19 DIAGNOSIS — R05 Cough: Secondary | ICD-10-CM | POA: Diagnosis not present

## 2019-08-19 DIAGNOSIS — K219 Gastro-esophageal reflux disease without esophagitis: Secondary | ICD-10-CM | POA: Diagnosis not present

## 2019-08-19 DIAGNOSIS — J3489 Other specified disorders of nose and nasal sinuses: Secondary | ICD-10-CM | POA: Diagnosis not present

## 2019-08-19 DIAGNOSIS — R053 Chronic cough: Secondary | ICD-10-CM | POA: Insufficient documentation

## 2019-08-19 DIAGNOSIS — J321 Chronic frontal sinusitis: Secondary | ICD-10-CM | POA: Diagnosis not present

## 2019-08-19 DIAGNOSIS — J479 Bronchiectasis, uncomplicated: Secondary | ICD-10-CM | POA: Diagnosis not present

## 2019-08-20 DIAGNOSIS — K219 Gastro-esophageal reflux disease without esophagitis: Secondary | ICD-10-CM | POA: Insufficient documentation

## 2019-08-21 DIAGNOSIS — M419 Scoliosis, unspecified: Secondary | ICD-10-CM | POA: Diagnosis not present

## 2019-08-21 DIAGNOSIS — R05 Cough: Secondary | ICD-10-CM | POA: Diagnosis not present

## 2019-08-23 ENCOUNTER — Other Ambulatory Visit: Payer: Self-pay | Admitting: Family Medicine

## 2019-08-23 ENCOUNTER — Ambulatory Visit
Admission: RE | Admit: 2019-08-23 | Discharge: 2019-08-23 | Disposition: A | Discharge status: Home / Self Care | Payer: Medicare Other | Source: Ambulatory Visit | Attending: Family Medicine | Admitting: Family Medicine

## 2019-08-23 DIAGNOSIS — M4185 Other forms of scoliosis, thoracolumbar region: Secondary | ICD-10-CM | POA: Diagnosis not present

## 2019-08-23 DIAGNOSIS — M419 Scoliosis, unspecified: Secondary | ICD-10-CM

## 2019-08-24 DIAGNOSIS — Z1231 Encounter for screening mammogram for malignant neoplasm of breast: Secondary | ICD-10-CM | POA: Diagnosis not present

## 2019-08-30 ENCOUNTER — Encounter: Payer: Self-pay | Admitting: Podiatry

## 2019-08-30 ENCOUNTER — Other Ambulatory Visit: Payer: Self-pay

## 2019-08-30 ENCOUNTER — Ambulatory Visit (INDEPENDENT_AMBULATORY_CARE_PROVIDER_SITE_OTHER): Payer: Medicare Other | Admitting: Podiatry

## 2019-08-30 DIAGNOSIS — B351 Tinea unguium: Secondary | ICD-10-CM | POA: Diagnosis not present

## 2019-08-30 DIAGNOSIS — D473 Essential (hemorrhagic) thrombocythemia: Secondary | ICD-10-CM

## 2019-08-30 DIAGNOSIS — M79676 Pain in unspecified toe(s): Secondary | ICD-10-CM | POA: Diagnosis not present

## 2019-08-30 NOTE — Progress Notes (Signed)
This patient returns to the office for evaluation and treatment of long thick painful nails .  This patient is unable to trim his own nails since the patient cannot reach his feet.  Patient says the nails are painful walking and wearing his shoes.  He returns for preventive foot care services.  General Appearance  Alert, conversant and in no acute stress.  Vascular  Dorsalis pedis and posterior tibial  pulses are palpable  bilaterally.  Capillary return is within normal limits  bilaterally. Temperature is within normal limits  bilaterally.  Neurologic  Senn-Weinstein monofilament wire test within normal limits  bilaterally. Muscle power within normal limits bilaterally.  Nails Thick disfigured discolored nails with subungual debris  from hallux to fifth toes bilaterally. No evidence of bacterial infection or drainage bilaterally.  Orthopedic  No limitations of motion  feet .  No crepitus or effusions noted.  No bony pathology or digital deformities noted.  Skin  normotropic skin with no porokeratosis noted bilaterally.  No signs of infections or ulcers noted.     Onychomycosis  Pain in toes right foot  Pain in toes left foot  Debridement  of nails  1-5  B/L with a nail nipper.  Nails were then filed using a dremel tool with no incidents.    RTC  12 weeks    Gardiner Barefoot DPM

## 2019-09-11 DIAGNOSIS — N289 Disorder of kidney and ureter, unspecified: Secondary | ICD-10-CM | POA: Diagnosis not present

## 2019-09-11 DIAGNOSIS — D649 Anemia, unspecified: Secondary | ICD-10-CM | POA: Diagnosis not present

## 2019-09-12 ENCOUNTER — Telehealth: Payer: Self-pay | Admitting: Pulmonary Disease

## 2019-09-12 ENCOUNTER — Ambulatory Visit (INDEPENDENT_AMBULATORY_CARE_PROVIDER_SITE_OTHER): Payer: Medicare Other | Admitting: Pulmonary Disease

## 2019-09-12 ENCOUNTER — Encounter: Payer: Self-pay | Admitting: Pulmonary Disease

## 2019-09-12 ENCOUNTER — Other Ambulatory Visit: Payer: Self-pay

## 2019-09-12 VITALS — BP 140/68 | HR 87 | Temp 98.6°F | Ht 60.0 in | Wt 116.0 lb

## 2019-09-12 DIAGNOSIS — J471 Bronchiectasis with (acute) exacerbation: Secondary | ICD-10-CM

## 2019-09-12 DIAGNOSIS — J479 Bronchiectasis, uncomplicated: Secondary | ICD-10-CM | POA: Insufficient documentation

## 2019-09-12 DIAGNOSIS — R05 Cough: Secondary | ICD-10-CM

## 2019-09-12 DIAGNOSIS — R058 Other specified cough: Secondary | ICD-10-CM

## 2019-09-12 MED ORDER — FLUTICASONE-SALMETEROL 250-50 MCG/DOSE IN AEPB: 1.0000 | INHALATION_SPRAY | Freq: Two times a day (BID) | RESPIRATORY_TRACT | 11 refills | Status: DC

## 2019-09-12 MED ORDER — LEVOFLOXACIN 500 MG PO TABS: 500.0000 mg | ORAL_TABLET | Freq: Every day | ORAL | 0 refills | Status: DC

## 2019-09-12 MED ORDER — BENZONATATE 100 MG PO CAPS: 100.0000 mg | ORAL_CAPSULE | Freq: Four times a day (QID) | ORAL | 1 refills | Status: DC | PRN

## 2019-09-12 NOTE — Patient Instructions (Signed)
Bronchiectasis --CONTINUE Advair as directed. Please call us with the dose so we can update our records --CONTINUE Albuterol as needed for shortness of breath or wheezing --START Flutter valve 10 x daily  --START mucinex as directed with breaks between dosages. Stay hydrated --We provide a sputum collection cup for AFB --START levaquin 500mg  x 7 days --Antibiotics are ok as needed. Avoid azithromycin as you may need this antibiotic in the future  We will fax notes to Dr. [facility]

## 2019-09-12 NOTE — Progress Notes (Signed)
Subjective:   PATIENT ID: Chelsea Garcia GENDER: female DOB: 1933/12/05, MRN: [id number]   HPI  Chief Complaint  Patient presents with  . Consult    dry cough, SOB with activity x 70months     Reason for Visit: New consult for abnormal CT Chest. Referred from PCP  Ms. Pooja Camuso is an 86 year old female former smoker who presents as a referral as anew consult for abnormal CT.  Faxed PCP notes from [place] was reviewed. She was seen by Dr. Orland Mustard on 08/20/19 for bilateral lower extremity swelling. She also recently had a CT lung obtained for shortness of breath and cough. CT 07/25/19 has bronchiectasis involving in the right middle and lingula associated with volume loss, scarring and few tree-in-bud opacities.  She reports long history of cough that has worsened in the last three months. She has chest congestion but unable to expel it. She feels she cannot take a deep breath. Has wheezing. She is unable to lay down due to coughing fits. She was started Advair last week. She takes one puff twice a day but not sure of the strength. She recently completed a course of azithromycin which helped but did not completely resolve her symptoms. Denies fevers, chills, weight loss, chest pain or fatigue.  Social History: Former smoker. Quit in 1963.  I have personally reviewed patient's past medical/family/social history, allergies, current medications.  Past Medical History:  Diagnosis Date  . Colon polyps    Benign. 2 colonoscopies in past. Due now again.  . Endometriosis    required TAH when young  . Essential tremor    since 40's  . GERD (gastroesophageal reflux disease)   . Glaucoma    started in 40's  . Low back pain    since 40's  . Migraine    started in 50's  . Osteopenia    2014  . Seasonal allergies    to Dust, mold and pollen.  . Thrombocytosis ([place]) September 2015  . Tick bites    she likes out door activities, 2 this year (2015)     Family History  Problem  Relation Age of Onset  . Thyroid disease Mother   . Cancer Father   . Cancer Maternal Aunt 4     Social History   Occupational History  . Not on file  Tobacco Use  . Smoking status: Former Smoker    Packs/day: 1.00    Years: 26.00    Pack years: 26.00    Types: Cigarettes    Quit date: 07/01/1961    Years since quitting: 58.2  . Smokeless tobacco: Never Used  Substance and Sexual Activity  . Alcohol use: No  . Drug use: No  . Sexual activity: Yes    Allergies  Allergen Reactions  . Bee Pollen Other (See Comments) and Cough    Mill dust, pollen. Mill dust, pollen.  . Pollen Extract Cough    Mill dust, pollen.  . Mite (D. Farinae) Other (See Comments)  . Sulfamethoxazole Other (See Comments)    Irritation in eyes  Other Other     Outpatient Medications Prior to Visit  Medication Sig Dispense Refill  . aspirin 81 MG tablet Take 81 mg by mouth daily.    . brimonidine (ALPHAGAN) 0.15 % ophthalmic solution 1 drop 2 (two) times daily.    . dorzolamidel-timolol (COSOPT) 22.3-6.8 MG/ML SOLN ophthalmic solution     . hydroxyurea (HYDREA) 500 MG capsule Take 500 mg by mouth once  a week. May take with food to minimize GI side effects.    . Latanoprostene Bunod 0.024 % SOLN     . naproxen sodium (ALEVE) 220 MG tablet Take by mouth.    Mckinley Jewel Dimesylate (RHOPRESSA) 0.02 % SOLN INT 1 GTT INTO OU QD    . omeprazole (PRILOSEC) 40 MG capsule Take 40 mg by mouth every morning.    . SUMAtriptan (IMITREX) 100 MG tablet Take 100 mg by mouth every 2 (two) hours as needed for migraine or headache. May repeat in 2 hours if headache persists or recurs.    Marland Kitchen albuterol (VENTOLIN HFA) 108 (90 Base) MCG/ACT inhaler INHALE 1 PUFF BY MOUTH EVERY 4 HOURS AS NEEDED (Patient not taking: Reported on 09/12/2019)    . azelastine (ASTELIN) 0.1 % nasal spray Place 1 spray into the nose 2 (two) times daily as needed for allergies. (Patient not taking: Reported on 09/12/2019)     No  facility-administered medications prior to visit.    Review of Systems  Constitutional: Negative for chills, diaphoresis, fever, malaise/fatigue and weight loss.  HENT: Negative for congestion, ear pain and sore throat.   Respiratory: Positive for cough, shortness of breath and wheezing. Negative for hemoptysis and sputum production.   Cardiovascular: Negative for chest pain, palpitations and leg swelling.  Gastrointestinal: Negative for abdominal pain, heartburn and nausea.  Genitourinary: Negative for frequency.  Musculoskeletal: Negative for joint pain and myalgias.  Skin: Negative for itching and rash.  Neurological: Negative for dizziness, weakness and headaches.  Endo/Heme/Allergies: Does not bruise/bleed easily.  Psychiatric/Behavioral: Negative for depression. The patient is not nervous/anxious.      Objective:   Vitals:   09/12/19 1130  BP: 140/68  Pulse: 87  Temp: 98.6 F (37 C)  TempSrc: Temporal  SpO2: 98%  Weight: 116 lb (52.6 kg)  Height: 5' (1.524 m)   SpO2: 98 % O2 Device: None (Room air)  Physical Exam: General: Elderly-appearing, no acute distress, mild scoliosis HENT: [place], AT Eyes: EOMI, no scleral icterus Respiratory: Clear to auscultation bilaterally.  No crackles, wheezing or rales Cardiovascular: RRR, -M/R/G, no JVD GI: BS+, soft, nontender Extremities:-Edema,-tenderness Neuro: AAO x4, CNII-XII grossly intact Skin: Intact, no rashes or bruising Psych: Normal mood, normal affect  Data Reviewed:  Imaging: CT 07/25/19 has bronchiectasis involving in the right middle and lingula associated with volume loss, scarring and few tree-in-bud opacities  PFT: None on file  Labs: CBC    Component Value Date/Time   WBC 8.8 07/17/2019 1005   WBC 8.8 05/17/2019 1127   RBC 3.87 07/17/2019 1005   HGB 10.8 (L) 07/17/2019 1005   HGB 12.4 12/16/2016 0908   HCT 35.3 (L) 07/17/2019 1005   HCT 38.6 12/16/2016 0908   PLT 382 07/17/2019 1005   PLT 271  12/16/2016 0908   MCV 91.2 07/17/2019 1005   MCV 101.8 (H) 12/16/2016 0908   MCH 27.9 07/17/2019 1005   MCHC 30.6 07/17/2019 1005   RDW 18.7 (H) 07/17/2019 1005   RDW 15.9 (H) 12/16/2016 0908   LYMPHSABS 0.5 (L) 07/17/2019 1005   LYMPHSABS 0.6 (L) 12/16/2016 0908   MONOABS 0.5 07/17/2019 1005   MONOABS 0.3 12/16/2016 0908   EOSABS 0.1 07/17/2019 1005   EOSABS 0.1 12/16/2016 0908   BASOSABS 0.1 07/17/2019 1005   BASOSABS 0.0 12/16/2016 0908   BMET    Component Value Date/Time   NA 140 07/17/2019 1005   NA 142 09/16/2016 0905   K 4.1 07/17/2019 1005   K  4.0 09/16/2016 0905   CL 110 07/17/2019 1005   CO2 24 07/17/2019 1005   CO2 23 09/16/2016 0905   GLUCOSE 90 07/17/2019 1005   GLUCOSE 107 09/16/2016 0905   BUN 16 07/17/2019 1005   BUN 18.3 09/16/2016 0905   CREATININE 0.92 07/17/2019 1005   CREATININE 0.8 09/16/2016 0905   CALCIUM 9.3 07/17/2019 1005   CALCIUM 9.8 09/16/2016 0905   GFRNONAA 57 (L) 07/17/2019 1005   GFRAA >60 07/17/2019 1005      Assessment & Plan:   Discussion: 86 year old female with bronchiectasis. May be related to prior/recurrent infections. Currently with active symptoms. Will treat with abx (avoid azithro), obtain sputum culture and educated on pulmonary hygiene as noted below. If unable to obtain sputum sample, may require diagnostic bronchoscopy  Bronchiectasis with exacerbation --CONTINUE Advair as directed. Please call us with the dose so we can update our records --CONTINUE Albuterol as needed for shortness of breath or wheezing --START Flutter valve 10 x daily  --START mucinex as directed with breaks between dosages. Stay hydrated --We provide a sputum collection cup for AFB --START levaquin 500mg  x 7 days --Antibiotics are ok as needed. Avoid azithromycin as you may need this antibiotic in the future  We will fax notes to Dr. Koriala, [place] Maintenance Immunization History  Administered Date(s) Administered  .  Influenza-Unspecified 10/11/2013  . PFIZER SARS-COV-2 Vaccination 01/15/2019, 02/20/2019   CT Lung Screen - not qualified  Orders Placed This Encounter  Procedures  . AFB Culture & Smear    Standing Status:   Future    Standing Expiration Date:   09/11/2020  . Flutter valve    Standing Status:   Future    Standing Expiration Date:   09/11/2020   Meds ordered this encounter  Medications  . levofloxacin (LEVAQUIN) 500 MG tablet    Sig: Take 1 tablet (500 mg total) by mouth daily.    Dispense:  7 tablet    Refill:  0  . benzonatate (TESSALON) 100 MG capsule    Sig: Take 1 capsule (100 mg total) by mouth every 6 (six) hours as needed for cough.    Dispense:  30 capsule    Refill:  1    Return in about 1 month (around 10/12/2019).  I have spent a total time of 48-minutes on the day of the appointment reviewing prior documentation, coordinating care and discussing medical diagnosis and plan with the patient/family. Imaging, labs and tests included in this note have been reviewed and interpreted independently by me.  [place], MD [facility] 09/12/2019 1:31 PM  Office Number [phone]

## 2019-09-12 NOTE — Telephone Encounter (Signed)
Spoke with pt, she just needs a refill of her Advair discus.  This has been sent to pharmacy as requested.  Nothing further needed at this time- will close encounter.

## 2019-09-13 ENCOUNTER — Telehealth: Payer: Self-pay | Admitting: Pulmonary Disease

## 2019-09-13 NOTE — Telephone Encounter (Signed)
Patient would like to know if flutter valve is the one you open to clean. Patient phone number is [phone].

## 2019-09-13 NOTE — Telephone Encounter (Signed)
Spoke with patient regarding prior message.Pateint stated that they figured out how to open a flutter valve that was ordered online to clean it out. Patient's voice was understanding. Nothing else further needed. Will close encounter

## 2019-09-17 ENCOUNTER — Telehealth: Payer: Self-pay | Admitting: Pulmonary Disease

## 2019-09-17 ENCOUNTER — Telehealth: Payer: Self-pay

## 2019-09-17 NOTE — Telephone Encounter (Signed)
Spoke with patient regarding prior message. Patient stated that she has been having real bad shaking in her hands from taking the albuterol last time patient used her albuterol was last night . Advised patient to not to use the medication. Also have not been able to produce any sputum since starting to use the flutter valve.Dr.Ellison can you please advise. Thank you

## 2019-09-17 NOTE — Telephone Encounter (Signed)
Left voice message for patient that Dr. Burr Medico has reviewed her lab work (CBC) and all looks good, no changes.  Encouraged her to call back if she has any questions.

## 2019-09-18 ENCOUNTER — Telehealth: Payer: Self-pay | Admitting: Pulmonary Disease

## 2019-09-18 DIAGNOSIS — R000 Tachycardia, unspecified: Secondary | ICD-10-CM | POA: Diagnosis not present

## 2019-09-18 DIAGNOSIS — G250 Essential tremor: Secondary | ICD-10-CM | POA: Diagnosis not present

## 2019-09-18 NOTE — Telephone Encounter (Signed)
Pt calling in again to let us know about the shaking and racing heart - states that she thinks it is a reaction between the Advair and antibiotics that she is on - she has stopped taking the Advair for now and the albuterol as well - please advise pt if this is ok - states that she will be done with the antibiotics tonight - please call back [phone]

## 2019-09-18 NOTE — Telephone Encounter (Signed)
It is fine to stop advair while on Levaquin

## 2019-09-18 NOTE — Telephone Encounter (Signed)
Spoke with the pt  She states that she had been taking advair bid, albuterol tid and the levaquin given to her on 09/12/19  She states that she started having shakiness in her hands and feeling like her heart was racing  She looked online and read albuterol could cause this as well as possible interaction with advair and levaquin  She states now she has stopped the albuterol as well as the advair  I reminded her that the albuterol was only prn anyway  She is asking if okay to stop advair while on abx  Her shaking and heart racing has completely stopped advair and albuterol  Please advise thanks

## 2019-09-18 NOTE — Telephone Encounter (Signed)
Spoke with the pt  She states went ahead and took her advair and her levaquin and noticed her heart started racing again  She does not want to take the advair any more  Has not taken any more albuterol  She states will have her spouse take her to UC  We have no openings this afternoon  Will forward to SG since [place] out today and she addressed msg regarding this earlier

## 2019-09-18 NOTE — Telephone Encounter (Signed)
Tried calling pt and there was no answer- LMTCB.  

## 2019-09-18 NOTE — Telephone Encounter (Signed)
If she wants to stop the prn albuterol while she is on the Levaquin that is fine, since she only has one more day of treatment. . She should not use  the rescue inhaler unless absolutely needed and only only if she gets short or breath. ( But no more than 2 puffs spaced by at least 6 hours . I am unsure how much Albuterol she was taking, and wonder if she was over using it. She  Should be ok using her Advair. If she is still having heart racing issues, she needs an EKG ( which we can do here) .Have her seek emergency care if she has heart racing that does not resolve.   Thanks so much

## 2019-09-18 NOTE — Telephone Encounter (Signed)
Spoke with the pt and made aware of response per Judson Roch and she verbalized understanding  Nothing further needed

## 2019-09-19 NOTE — Telephone Encounter (Signed)
Spoke with the pt and notified of response per Chelsea Garcia and she verbalized understanding. Nothing further needed.

## 2019-09-23 ENCOUNTER — Other Ambulatory Visit: Payer: Self-pay | Admitting: Hematology

## 2019-09-23 ENCOUNTER — Other Ambulatory Visit: Payer: Self-pay

## 2019-09-23 ENCOUNTER — Other Ambulatory Visit: Payer: Self-pay | Admitting: Pulmonary Disease

## 2019-09-23 DIAGNOSIS — G250 Essential tremor: Secondary | ICD-10-CM | POA: Diagnosis not present

## 2019-09-23 DIAGNOSIS — B370 Candidal stomatitis: Secondary | ICD-10-CM | POA: Diagnosis not present

## 2019-09-23 DIAGNOSIS — E038 Other specified hypothyroidism: Secondary | ICD-10-CM | POA: Diagnosis not present

## 2019-09-23 NOTE — Telephone Encounter (Signed)
Spoke with patient regarding prior message. Patient stated she will need a refill on advair sent into her pharmacy advised patient she has 11 refills and needed to contact her pharmacy  . Patient stated her lips was peeling for a few weeks but stopped and the inside of her mouth been rough for 5 days no white patches .Pateint stated she will see her GP today and they can look in her mouth. Patient wanted to know if this was side effects from the inhaler Patient's voice was understanding. Nothing else further needed.   Beth can you please advise.  Thank you

## 2019-09-23 NOTE — Telephone Encounter (Signed)
Potentially a side effect, see what PCP has to say. May want to try Nystatin swish and swallow

## 2019-09-23 NOTE — Telephone Encounter (Signed)
LMTCB x 1 

## 2019-09-23 NOTE — Progress Notes (Signed)
Hydroxyurea refilled as requested

## 2019-09-24 ENCOUNTER — Telehealth: Payer: Self-pay | Admitting: Pulmonary Disease

## 2019-09-24 NOTE — Telephone Encounter (Signed)
Returned patient, she advised me that her pcp office sent in a prescription to her pharmacy, but did not call her.  Nothing further needed.

## 2019-09-24 NOTE — Telephone Encounter (Signed)
Continued  Her mouth is still swollen. She has reached out to her pcp but has not been able to get through to the office,

## 2019-09-26 DIAGNOSIS — M40209 Unspecified kyphosis, site unspecified: Secondary | ICD-10-CM | POA: Diagnosis not present

## 2019-09-26 DIAGNOSIS — M419 Scoliosis, unspecified: Secondary | ICD-10-CM | POA: Diagnosis not present

## 2019-09-26 NOTE — Telephone Encounter (Signed)
Called and spoke to pt. Pt states she was seen by her PCP but the swelling was going down and the PCP didn't think a script was needed. Pt states she is now having some yellow crusting on her lower lip. Pt does not think it is thrush but a possible infection. Pt denies any other s/s. Pt states she has sent messages to her PCP but has not heard back yet.   I called her PCP office, Dr. Dorthy Cooler, and advised that pt needs another appt to eval her mouth. Will keep message open to assure pt gets a follow up with her PCP.   Will forward to [facility] as FYI.

## 2019-10-01 ENCOUNTER — Telehealth: Payer: Self-pay | Admitting: Pulmonary Disease

## 2019-10-01 DIAGNOSIS — J471 Bronchiectasis with (acute) exacerbation: Secondary | ICD-10-CM

## 2019-10-01 NOTE — Telephone Encounter (Signed)
Called and spoke to pt. Pt states she was able to see her PCP. Pt states she thinks it a cold sore, it is getting better. Pt denied any further questions or concerns at this time. Will sign off.

## 2019-10-01 NOTE — Telephone Encounter (Signed)
I called and spoke with the patient, she stated she needs another flutter valve.  I let her know that I put one up front for her and she can come by and get it.  I let her know that she would need to sign a form to receive it.  She verbalized understanding.  Nothing further needed.

## 2019-10-04 DIAGNOSIS — K130 Diseases of lips: Secondary | ICD-10-CM | POA: Diagnosis not present

## 2019-10-08 DIAGNOSIS — M6281 Muscle weakness (generalized): Secondary | ICD-10-CM | POA: Diagnosis not present

## 2019-10-08 DIAGNOSIS — M419 Scoliosis, unspecified: Secondary | ICD-10-CM | POA: Diagnosis not present

## 2019-10-13 DIAGNOSIS — Z23 Encounter for immunization: Secondary | ICD-10-CM | POA: Diagnosis not present

## 2019-10-15 DIAGNOSIS — M6281 Muscle weakness (generalized): Secondary | ICD-10-CM | POA: Diagnosis not present

## 2019-10-15 DIAGNOSIS — M419 Scoliosis, unspecified: Secondary | ICD-10-CM | POA: Diagnosis not present

## 2019-10-17 ENCOUNTER — Inpatient Hospital Stay: Payer: Medicare Other | Attending: Hematology

## 2019-10-17 ENCOUNTER — Other Ambulatory Visit: Payer: Self-pay

## 2019-10-17 DIAGNOSIS — D473 Essential (hemorrhagic) thrombocythemia: Secondary | ICD-10-CM | POA: Diagnosis not present

## 2019-10-17 DIAGNOSIS — M419 Scoliosis, unspecified: Secondary | ICD-10-CM | POA: Diagnosis not present

## 2019-10-17 DIAGNOSIS — M6281 Muscle weakness (generalized): Secondary | ICD-10-CM | POA: Diagnosis not present

## 2019-10-17 LAB — CBC WITH DIFFERENTIAL (CANCER CENTER ONLY)
Abs Immature Granulocytes: 0.14 10*3/uL — ABNORMAL HIGH (ref 0.00–0.07)
Basophils Absolute: 0 10*3/uL (ref 0.0–0.1)
Basophils Relative: 0 %
Eosinophils Absolute: 0.1 10*3/uL (ref 0.0–0.5)
Eosinophils Relative: 1 %
HCT: 36.5 % (ref 36.0–46.0)
Hemoglobin: 11.1 g/dL — ABNORMAL LOW (ref 12.0–15.0)
Immature Granulocytes: 2 %
Lymphocytes Relative: 6 %
Lymphs Abs: 0.6 10*3/uL — ABNORMAL LOW (ref 0.7–4.0)
MCH: 26.5 pg (ref 26.0–34.0)
MCHC: 30.4 g/dL (ref 30.0–36.0)
MCV: 87.1 fL (ref 80.0–100.0)
Monocytes Absolute: 0.5 10*3/uL (ref 0.1–1.0)
Monocytes Relative: 6 %
Neutro Abs: 8 10*3/uL — ABNORMAL HIGH (ref 1.7–7.7)
Neutrophils Relative %: 85 %
Platelet Count: 400 10*3/uL (ref 150–400)
RBC: 4.19 MIL/uL (ref 3.87–5.11)
RDW: 18 % — ABNORMAL HIGH (ref 11.5–15.5)
WBC Count: 9.3 10*3/uL (ref 4.0–10.5)
nRBC: 0 % (ref 0.0–0.2)

## 2019-10-17 LAB — COMPREHENSIVE METABOLIC PANEL
ALT: 11 U/L (ref 0–44)
AST: 15 U/L (ref 15–41)
Albumin: 4 g/dL (ref 3.5–5.0)
Alkaline Phosphatase: 47 U/L (ref 38–126)
Anion gap: 4 — ABNORMAL LOW (ref 5–15)
BUN: 30 mg/dL — ABNORMAL HIGH (ref 8–23)
CO2: 28 mmol/L (ref 22–32)
Calcium: 9.4 mg/dL (ref 8.9–10.3)
Chloride: 107 mmol/L (ref 98–111)
Creatinine, Ser: 1.01 mg/dL — ABNORMAL HIGH (ref 0.44–1.00)
GFR calc non Af Amer: 51 mL/min — ABNORMAL LOW (ref >60)
Glucose, Bld: 103 mg/dL — ABNORMAL HIGH (ref 70–99)
Potassium: 4.3 mmol/L (ref 3.5–5.1)
Sodium: 139 mmol/L (ref 135–145)
Total Bilirubin: 0.5 mg/dL (ref 0.3–1.2)
Total Protein: 6.8 g/dL (ref 6.5–8.1)

## 2019-10-22 DIAGNOSIS — K130 Diseases of lips: Secondary | ICD-10-CM | POA: Diagnosis not present

## 2019-10-22 DIAGNOSIS — M419 Scoliosis, unspecified: Secondary | ICD-10-CM | POA: Diagnosis not present

## 2019-10-22 DIAGNOSIS — M6281 Muscle weakness (generalized): Secondary | ICD-10-CM | POA: Diagnosis not present

## 2019-10-23 ENCOUNTER — Encounter: Payer: Self-pay | Admitting: Pulmonary Disease

## 2019-10-23 ENCOUNTER — Other Ambulatory Visit: Payer: Self-pay

## 2019-10-23 ENCOUNTER — Ambulatory Visit (INDEPENDENT_AMBULATORY_CARE_PROVIDER_SITE_OTHER): Payer: Medicare Other | Admitting: Pulmonary Disease

## 2019-10-23 VITALS — BP 110/60 | HR 68 | Temp 96.5°F | Ht 63.0 in | Wt 112.2 lb

## 2019-10-23 DIAGNOSIS — J479 Bronchiectasis, uncomplicated: Secondary | ICD-10-CM

## 2019-10-23 MED ORDER — FLUTICASONE-SALMETEROL 100-50 MCG/DOSE IN AEPB: 1.0000 | INHALATION_SPRAY | Freq: Two times a day (BID) | RESPIRATORY_TRACT | 12 refills | Status: DC

## 2019-10-23 NOTE — Patient Instructions (Addendum)
Bronchiectasis  --DECREASE Advair to 100 mcg. Take ONE puff TWICE a day --DISCONTINUE Albuterol due to side effects --CONTINUE Flutter valve as needed up to 10 x daily  --Antibiotics are ok as needed. Avoid azithromycin as you may need this antibiotic in the future  We will fax notes to Dr. [facility]   Follow-up with me in 3 months

## 2019-10-23 NOTE — Progress Notes (Signed)
Subjective:   PATIENT ID: [name] GENDER: female DOB: 1933-09-10, MRN: [id number]   HPI  Chief Complaint  Patient presents with  . Follow-up    wants to know if she can come off the advair.  Wants another flutter valve.    Reason for Visit: Follow-up bronchiectasis  Ms. Steffany Schoenfelder is an 86 year old female former smoker who presents for follow-up for bronchiectasis.  Synopsis: Referred to Pulmonary clinic for CT demonstrating bronchiectasis and tree-in-bud opacities. She has long history of cough, wheezing and chest congestion that has worsened in the summer of 2021.   10/23/19 Last visit started Advair and started however developed shakiness and heart racing. However her cough has improved and wheezing has resolved. Sputum has improved with antibiotics and bronchiectasis. She has been compliant with her flutter valve and has had good results. She is currently doing physical therapy at [facility]. Denies fevers, chills, weight loss, night sweats  Social History: Former smoker. Quit in 1963.  I have personally reviewed patient's past medical/family/social history/allergies/current medications.  Past Medical History:  Diagnosis Date  . Colon polyps    Benign. 2 colonoscopies in past. Due now again.  . Endometriosis    required TAH when young  . Essential tremor    since 40's  . GERD (gastroesophageal reflux disease)   . Glaucoma    started in 40's  . Low back pain    since 40's  . Migraine    started in 50's  . Osteopenia    2014  . Seasonal allergies    to Dust, mold and pollen.  . Thrombocytosis ([place]) September 2015  . Tick bites    she likes out door activities, 2 this year (2015)     Allergies  Allergen Reactions  . Bee Pollen Other (See Comments) and Cough    Mill dust, pollen. Mill dust, pollen.  . Pollen Extract Cough    Mill dust, pollen.  . Mite (D. Farinae) Other (See Comments)  . Sulfamethoxazole Other (See Comments)     Irritation in eyes  Other Other     Outpatient Medications Prior to Visit  Medication Sig Dispense Refill  . albuterol (VENTOLIN HFA) 108 (90 Base) MCG/ACT inhaler INHALE 1 PUFF BY MOUTH EVERY 4 HOURS AS NEEDED (Patient not taking: Reported on 09/12/2019)    . aspirin 81 MG tablet Take 81 mg by mouth daily.    Marland Kitchen azelastine (ASTELIN) 0.1 % nasal spray Place 1 spray into the nose 2 (two) times daily as needed for allergies. (Patient not taking: Reported on 09/12/2019)    . benzonatate (TESSALON) 100 MG capsule Take 1 capsule (100 mg total) by mouth every 6 (six) hours as needed for cough. 30 capsule 1  . brimonidine (ALPHAGAN) 0.15 % ophthalmic solution 1 drop 2 (two) times daily.    . dorzolamidel-timolol (COSOPT) 22.3-6.8 MG/ML SOLN ophthalmic solution     . Fluticasone-Salmeterol (ADVAIR) 250-50 MCG/DOSE AEPB Inhale 1 puff into the lungs every 12 (twelve) hours. 60 each 11  . hydroxyurea (HYDREA) 500 MG capsule TAKE ONE CAPSULE BY MOUTH DAILY WITH FOOD TO MINIMIZE GI SIDE EFFECTS 90 capsule 2  . Latanoprostene Bunod 0.024 % SOLN     . levofloxacin (LEVAQUIN) 500 MG tablet Take 1 tablet (500 mg total) by mouth daily. 7 tablet 0  . naproxen sodium (ALEVE) 220 MG tablet Take by mouth.    Mckinley Jewel Dimesylate (RHOPRESSA) 0.02 % SOLN INT 1 GTT INTO OU QD    .  omeprazole (PRILOSEC) 40 MG capsule Take 40 mg by mouth every morning.    . SUMAtriptan (IMITREX) 100 MG tablet Take 100 mg by mouth every 2 (two) hours as needed for migraine or headache. May repeat in 2 hours if headache persists or recurs.     No facility-administered medications prior to visit.    Review of Systems  Constitutional: Negative for chills, diaphoresis, fever, malaise/fatigue and weight loss.  HENT: Negative for congestion.   Respiratory: Positive for cough and wheezing. Negative for hemoptysis, sputum production and shortness of breath.   Cardiovascular: Negative for chest pain, palpitations and leg swelling.   Neurological: Positive for tremors.     Objective:   Vitals:   10/23/19 1217  BP: 110/60  Pulse: 68  Temp: (!) 96.5 F (35.8 C)  TempSrc: Temporal  SpO2: 97%  Weight: 112 lb 3.2 oz (50.9 kg)  Height: 5\' 3"  (1.6 m)      Physical Exam: General: Elderly-appearing, no acute distress HENT: [place], AT, OP clear, MMM Eyes: EOMI, no scleral icterus Respiratory: Clear to auscultation bilaterally.  No crackles, wheezing or rales Cardiovascular: RRR, -M/R/G, no JVD Extremities: Minimal non-pitting lower extremity edema,-tenderness Neuro: AAO x4, CNII-XII grossly intact Skin: Intact, no rashes or bruising Psych: Normal mood, normal affect  Data Reviewed:  Imaging: CT 07/25/19 has bronchiectasis involving in the right middle and lingula associated with volume loss, scarring and few tree-in-bud opacities  PFT: None on file  Labs: CBC    Component Value Date/Time   WBC 9.3 10/17/2019 1018   WBC 8.8 05/17/2019 1127   RBC 4.19 10/17/2019 1018   HGB 11.1 (L) 10/17/2019 1018   HGB 12.4 12/16/2016 0908   HCT 36.5 10/17/2019 1018   HCT 38.6 12/16/2016 0908   PLT 400 10/17/2019 1018   PLT 271 12/16/2016 0908   MCV 87.1 10/17/2019 1018   MCV 101.8 (H) 12/16/2016 0908   MCH 26.5 10/17/2019 1018   MCHC 30.4 10/17/2019 1018   RDW 18.0 (H) 10/17/2019 1018   RDW 15.9 (H) 12/16/2016 0908   LYMPHSABS 0.6 (L) 10/17/2019 1018   LYMPHSABS 0.6 (L) 12/16/2016 0908   MONOABS 0.5 10/17/2019 1018   MONOABS 0.3 12/16/2016 0908   EOSABS 0.1 10/17/2019 1018   EOSABS 0.1 12/16/2016 0908   BASOSABS 0.0 10/17/2019 1018   BASOSABS 0.0 12/16/2016 0908   BMET    Component Value Date/Time   NA 139 10/17/2019 1018   NA 142 09/16/2016 0905   K 4.3 10/17/2019 1018   K 4.0 09/16/2016 0905   CL 107 10/17/2019 1018   CO2 28 10/17/2019 1018   CO2 23 09/16/2016 0905   GLUCOSE 103 (H) 10/17/2019 1018   GLUCOSE 107 09/16/2016 0905   BUN 30 (H) 10/17/2019 1018   BUN 18.3 09/16/2016 0905    CREATININE 1.01 (H) 10/17/2019 1018   CREATININE 0.8 09/16/2016 0905   CALCIUM 9.4 10/17/2019 1018   CALCIUM 9.8 09/16/2016 0905   GFRNONAA 51 (L) 10/17/2019 1018   GFRAA >60 07/17/2019 1005   Imaging, labs and test noted above have been reviewed independently by me.  Assessment & Plan:   Discussion: 86 year old female with bronchiectasis. Suspect etiology may be related to prior/recurrent infections. Has had benefit with pulmonary hygiene with bronchodilators and flutter valve. However reports tremors related to inhalers. Discussed adverse effects of inhalers. We agreed to try lower strength of inhaler to see if this improves the side effects while still delivering the same benefit.  Bronchiectasis without complication --DECREASE Advair to 100 mcg. Take ONE puff TWICE a day --DISCONTINUE Albuterol due to side effects --CONTINUE Flutter valve as needed up to 10 x daily  --Antibiotics are ok as needed. Avoid azithromycin as you may need this antibiotic in the future  We will fax notes to Dr. Koriala, [place] Maintenance Immunization History  Administered Date(s) Administered  . Influenza-Unspecified 10/11/2013  . PFIZER SARS-COV-2 Vaccination 01/15/2019, 02/20/2019   CT Lung Screen - not qualified  No orders of the defined types were placed in this encounter.  Meds ordered this encounter  Medications  . Fluticasone-Salmeterol (ADVAIR DISKUS) 100-50 MCG/DOSE AEPB    Sig: Inhale 1 puff into the lungs in the morning and at bedtime.    Dispense:  60 each    Refill:  12    Return in about 3 months (around 01/23/2020).  I have spent a total time of 31-minutes on the day of the appointment reviewing prior documentation, coordinating care and discussing medical diagnosis and plan with the patient/family. Imaging, labs and tests included in this note have been reviewed and interpreted independently by me.  [place], MD [facility] 10/23/2019 7:44 AM  Office Number [phone]

## 2019-10-28 ENCOUNTER — Other Ambulatory Visit: Payer: Self-pay | Admitting: Family Medicine

## 2019-10-28 DIAGNOSIS — K469 Unspecified abdominal hernia without obstruction or gangrene: Secondary | ICD-10-CM

## 2019-10-28 DIAGNOSIS — R1909 Other intra-abdominal and pelvic swelling, mass and lump: Secondary | ICD-10-CM | POA: Diagnosis not present

## 2019-10-29 DIAGNOSIS — M419 Scoliosis, unspecified: Secondary | ICD-10-CM | POA: Diagnosis not present

## 2019-10-29 DIAGNOSIS — M6281 Muscle weakness (generalized): Secondary | ICD-10-CM | POA: Diagnosis not present

## 2019-10-31 DIAGNOSIS — M6281 Muscle weakness (generalized): Secondary | ICD-10-CM | POA: Diagnosis not present

## 2019-10-31 DIAGNOSIS — M419 Scoliosis, unspecified: Secondary | ICD-10-CM | POA: Diagnosis not present

## 2019-11-05 DIAGNOSIS — M6281 Muscle weakness (generalized): Secondary | ICD-10-CM | POA: Diagnosis not present

## 2019-11-05 DIAGNOSIS — M419 Scoliosis, unspecified: Secondary | ICD-10-CM | POA: Diagnosis not present

## 2019-11-06 ENCOUNTER — Ambulatory Visit
Admission: RE | Admit: 2019-11-06 | Discharge: 2019-11-06 | Disposition: A | Discharge status: Home / Self Care | Payer: Medicare Other | Source: Ambulatory Visit | Attending: Family Medicine | Admitting: Family Medicine

## 2019-11-06 DIAGNOSIS — K469 Unspecified abdominal hernia without obstruction or gangrene: Secondary | ICD-10-CM

## 2019-11-07 ENCOUNTER — Ambulatory Visit
Admission: RE | Admit: 2019-11-07 | Discharge: 2019-11-07 | Disposition: A | Discharge status: Home / Self Care | Payer: Medicare Other | Source: Ambulatory Visit | Attending: Family Medicine | Admitting: Family Medicine

## 2019-11-07 DIAGNOSIS — K4090 Unilateral inguinal hernia, without obstruction or gangrene, not specified as recurrent: Secondary | ICD-10-CM | POA: Diagnosis not present

## 2019-11-07 DIAGNOSIS — R1909 Other intra-abdominal and pelvic swelling, mass and lump: Secondary | ICD-10-CM | POA: Diagnosis not present

## 2019-11-13 DIAGNOSIS — M6281 Muscle weakness (generalized): Secondary | ICD-10-CM | POA: Diagnosis not present

## 2019-11-13 DIAGNOSIS — M419 Scoliosis, unspecified: Secondary | ICD-10-CM | POA: Diagnosis not present

## 2019-11-14 DIAGNOSIS — R202 Paresthesia of skin: Secondary | ICD-10-CM | POA: Diagnosis not present

## 2019-11-14 DIAGNOSIS — B370 Candidal stomatitis: Secondary | ICD-10-CM | POA: Diagnosis not present

## 2019-11-18 ENCOUNTER — Ambulatory Visit: Payer: Self-pay | Admitting: Surgery

## 2019-11-18 DIAGNOSIS — K4090 Unilateral inguinal hernia, without obstruction or gangrene, not specified as recurrent: Secondary | ICD-10-CM | POA: Diagnosis not present

## 2019-11-18 NOTE — H&P (Signed)
Chelsea Garcia Appointment: 11/18/2019 1:50 PM Location: [facility] Patient #: [id number] DOB: 03/14/1933 Married / Language: Chelsea Garcia / Race: White Female  History of Present Illness Chelsea Moores A. [name] MD; 11/18/2019 3:40 PM) Patient words: Patient presents for evaluation of the left groin bulge for one week. His pops in and pops out. It does cause mild to moderate discomfort especially when she is active during her physical therapy. She was seen by her physician and ultrasound was used to evaluate this. This is consistent with a left inguinal hernia. She has no nausea vomiting or change in bowel or bladder function currently. The pain is moderate in nature and the hernia pops in and pops out quite easily she states.  The patient is a 86 year old female.   Past Surgical History (Chelsea Garcia, Chelsea Garcia; 11/18/2019 1:43 PM) Breast Biopsy Left. Cataract Surgery Left. Cesarean Section - Multiple Colon Polyp Removal - Colonoscopy Hysterectomy (not due to cancer) - Complete  Diagnostic Studies History (Chelsea Garcia, Garcia; 11/18/2019 1:43 PM) Colonoscopy 5-10 years ago Mammogram within last year  Allergies (Chelsea Garcia, Garcia; 11/18/2019 1:44 PM) Nystatin *ANTIFUNGALS* Albuterol *ANTIASTHMATIC AND BRONCHODILATOR AGENTS* Sulfamethoxazole *SULFONAMIDES* Allergies Reconciled  Medication History (Chelsea Garcia, Garcia; 11/18/2019 1:44 PM) Azelastine HCl (0.1% Solution, Nasal) Active. Advair Diskus (250-50MCG/DOSE Aero Pow Br Act, Inhalation) Active. Benzonatate (100MG  Capsule, Oral) Active. Brimonidine Tartrate (0.15% Solution, Ophthalmic) Active. Medications Reconciled  Social History Chelsea Garcia, Garcia; 11/18/2019 1:43 PM) Alcohol use Occasional alcohol use. Caffeine use Tea. No drug use Tobacco use Former smoker.  Family History Chelsea Garcia, Chelsea Garcia; 11/18/2019 1:43 PM) Alcohol Abuse Brother. Arthritis Mother. Cerebrovascular Accident Brother. Depression  Sister. Malignant Neoplasm Of Pancreas Father. Thyroid problems Mother.  Pregnancy / Birth History Chelsea Garcia, [place]; 11/18/2019 1:43 PM) Age at menarche 20 years. Age of menopause 34-50 Gravida 2 Length (months) of breastfeeding 3-6 Maternal age 91-25 Para 2  Other Problems Chelsea Garcia, [place]; 11/18/2019 1:43 PM) Back Pain Gastroesophageal Reflux Disease General anesthesia - complications Hemorrhoids Inguinal Hernia Migraine Headache     Review of Systems (Chelsea Garcia; 11/18/2019 1:43 PM) General Present- Fatigue and Weight Loss. Not Present- Appetite Loss, Chills, Fever, Night Sweats and Weight Gain. Skin Not Present- Change in Wart/Mole, Dryness, Hives, Jaundice, New Lesions, Non-Healing Wounds, Rash and Ulcer. HEENT Present- Wears glasses/contact lenses. Not Present- Earache, Hearing Loss, Hoarseness, Nose Bleed, Oral Ulcers, Ringing in the Ears, Seasonal Allergies, Sinus Pain, Sore Throat, Visual Disturbances and Yellow Eyes. Respiratory Not Present- Bloody sputum, Chronic Cough, Difficulty Breathing, Snoring and Wheezing. Cardiovascular Present- Difficulty Breathing Lying Down, Leg Cramps and Shortness of Breath. Not Present- Chest Pain, Palpitations, Rapid Heart Rate and Swelling of Extremities. Gastrointestinal Not Present- Abdominal Pain, Bloating, Bloody Stool, Change in Bowel Habits, Chronic diarrhea, Constipation, Difficulty Swallowing, Excessive gas, Gets full quickly at meals, Hemorrhoids, Indigestion, Nausea, Rectal Pain and Vomiting. Female Genitourinary Present- Frequency, Nocturia and Urgency. Not Present- Painful Urination and Pelvic Pain. Musculoskeletal Present- Joint Stiffness and Muscle Weakness. Not Present- Back Pain, Joint Pain, Muscle Pain and Swelling of Extremities. Neurological Present- Numbness, Tremor and Weakness. Not Present- Decreased Memory, Fainting, Headaches, Seizures, Tingling and Trouble walking. Psychiatric Not Present-  Anxiety, Bipolar, Change in Sleep Pattern, Depression, Fearful and Frequent crying. Endocrine Present- Hair Changes. Not Present- Cold Intolerance, Excessive Hunger, Heat Intolerance, Hot flashes and New Diabetes.  Vitals (Chelsea Garcia; 11/18/2019 1:45 PM) 11/18/2019 1:44 PM Weight: 114.13 lb Height: 61.5in Body Surface Area: 1.5 m Body Mass Index: 21.21 kg/m  Temp.:  97.33F  Pulse: 77 (Regular)  BP: 120/72(Sitting, Left Arm, Standard)        Physical Exam (Chelsea Durrett A. Jariel Drost MD; 11/18/2019 3:41 PM)  General Mental Status-Alert. General Appearance-Consistent with stated age. Hydration-Well hydrated. Voice-Normal.  Chest and Lung Exam Chest and lung exam reveals -quiet, even and easy respiratory effort with no use of accessory muscles and on auscultation, normal breath sounds, no adventitious sounds and normal vocal resonance. Inspection Chest Wall - Normal. Back - normal.  Cardiovascular Cardiovascular examination reveals -normal heart sounds, regular rate and rhythm with no murmurs and normal pedal pulses bilaterally.  Abdomen Note: Reducible left inguinal hernia. No evidence of running oh hernia. Large midline scar from previous surgery noted. Soft nontender.  Musculoskeletal Normal Exam - Left-Upper Extremity Strength Normal and Lower Extremity Strength Normal. Normal Exam - Right-Upper Extremity Strength Normal, Lower Extremity Weakness.    Assessment & Plan (Chelsea Bensinger A. Jurline Folger MD; 11/18/2019 3:41 PM)  LEFT INGUINAL HERNIA (K40.90) Impression: Patient very active and would like repair. I discussed the pros and constipation with her underlying medical problems. She will need pulmonary clearance and need to see anesthesia preop since she's had problems with anesthetics she states. Recommended open left inguinal hernia repair with mesh  The risk of hernia repair include bleeding, infection, organ injury, bowel injury, bladder injury, nerve  injury recurrent hernia, blood clots, worsening of underlying condition, chronic pain, mesh use, open surgery, death, and the need for other operations. Pt agrees to proceed  total time 30 min  Current Plans You are being scheduled for surgery- Our schedulers will call you.  You should hear from our office's scheduling department within 5 working days about the location, date, and time of surgery. We try to make accommodations for patient's preferences in scheduling surgery, but sometimes the OR schedule or the surgeon's schedule prevents [place] from making those accommodations.  If you have not heard from our office [phone]) in 5 working days, call the office and ask for your surgeon's nurse.  If you have other questions about your diagnosis, plan, or surgery, call the office and ask for your surgeon's nurse.  Pt Education - CCS Mesh education: discussed with patient and provided information. CCS Consent - Hernia Repair - Ventral/Incisional/Umbilical: discussed with patient and provided information

## 2019-11-19 DIAGNOSIS — M6281 Muscle weakness (generalized): Secondary | ICD-10-CM | POA: Diagnosis not present

## 2019-11-19 DIAGNOSIS — M419 Scoliosis, unspecified: Secondary | ICD-10-CM | POA: Diagnosis not present

## 2019-11-21 DIAGNOSIS — M6281 Muscle weakness (generalized): Secondary | ICD-10-CM | POA: Diagnosis not present

## 2019-11-21 DIAGNOSIS — M419 Scoliosis, unspecified: Secondary | ICD-10-CM | POA: Diagnosis not present

## 2019-11-26 DIAGNOSIS — M419 Scoliosis, unspecified: Secondary | ICD-10-CM | POA: Diagnosis not present

## 2019-11-26 DIAGNOSIS — M6281 Muscle weakness (generalized): Secondary | ICD-10-CM | POA: Diagnosis not present

## 2019-11-28 DIAGNOSIS — M419 Scoliosis, unspecified: Secondary | ICD-10-CM | POA: Diagnosis not present

## 2019-11-28 DIAGNOSIS — M6281 Muscle weakness (generalized): Secondary | ICD-10-CM | POA: Diagnosis not present

## 2019-12-02 ENCOUNTER — Telehealth: Payer: Self-pay | Admitting: Pulmonary Disease

## 2019-12-02 NOTE — Telephone Encounter (Signed)
I located the surgery clearance form in C pod and gave to Leigh to give to Dr Loanne Drilling. Pt aware that the form has been received and that we are working on it.

## 2019-12-03 DIAGNOSIS — M6281 Muscle weakness (generalized): Secondary | ICD-10-CM | POA: Diagnosis not present

## 2019-12-03 DIAGNOSIS — M419 Scoliosis, unspecified: Secondary | ICD-10-CM | POA: Diagnosis not present

## 2019-12-04 ENCOUNTER — Ambulatory Visit (INDEPENDENT_AMBULATORY_CARE_PROVIDER_SITE_OTHER): Payer: Medicare Other | Admitting: Podiatry

## 2019-12-04 ENCOUNTER — Other Ambulatory Visit: Payer: Self-pay

## 2019-12-04 ENCOUNTER — Encounter: Payer: Self-pay | Admitting: Podiatry

## 2019-12-04 DIAGNOSIS — D473 Essential (hemorrhagic) thrombocythemia: Secondary | ICD-10-CM

## 2019-12-04 DIAGNOSIS — M79609 Pain in unspecified limb: Secondary | ICD-10-CM

## 2019-12-04 DIAGNOSIS — B351 Tinea unguium: Secondary | ICD-10-CM

## 2019-12-04 NOTE — Progress Notes (Signed)
This patient returns to the office for evaluation and treatment of long thick painful nails .  This patient is unable to trim her own nails since the patient cannot reach her feet.  Patient says the nails are painful walking and wearing his shoes.  He returns for preventive foot care services.  General Appearance  Alert, conversant and in no acute stress.  Vascular  Dorsalis pedis and posterior tibial  pulses are palpable  bilaterally.  Capillary return is within normal limits  bilaterally. Temperature is within normal limits  bilaterally.  Neurologic  Senn-Weinstein monofilament wire test within normal limits  bilaterally. Muscle power within normal limits bilaterally.  Nails Thick disfigured discolored nails with subungual debris  from hallux to fifth toes bilaterally. No evidence of bacterial infection or drainage bilaterally.  Orthopedic  No limitations of motion  feet .  No crepitus or effusions noted.  No bony pathology or digital deformities noted.  Skin  normotropic skin with no porokeratosis noted bilaterally.  No signs of infections or ulcers noted.     Onychomycosis  Pain in toes right foot  Pain in toes left foot  Debridement  of nails  1-5  B/L with a nail nipper.  Nails were then filed using a dremel tool with no incidents.    RTC  12 weeks    Gardiner Barefoot DPM

## 2019-12-10 NOTE — Telephone Encounter (Signed)
[facility] calling to inquire about surgery clearence - they still have not received and are waiting to schedule surgery for pt - please fax to: FAX: [phone]

## 2019-12-10 NOTE — Telephone Encounter (Signed)
Dr. Loanne Drilling do you remember getting a surgical clearance form for this patient?

## 2019-12-11 NOTE — Telephone Encounter (Addendum)
Surgical clearance letter located in communications. Please print and fax to [phone] ATTN: Gaye Alken. Rolena Infante, [name]

## 2019-12-12 NOTE — Telephone Encounter (Signed)
Letter has been printed and has been faxed to Carlene Coria at provided fax number. Fax confirmation received stating that the fax went through successfully.  Called and spoke with pt letting her know that this had been done and stated to her that [facility] should be able to receive the letter soon. Pt verbalized understanding. Nothing further needed.

## 2019-12-17 DIAGNOSIS — H52203 Unspecified astigmatism, bilateral: Secondary | ICD-10-CM | POA: Diagnosis not present

## 2019-12-17 DIAGNOSIS — H401133 Primary open-angle glaucoma, bilateral, severe stage: Secondary | ICD-10-CM | POA: Diagnosis not present

## 2019-12-17 DIAGNOSIS — Z961 Presence of intraocular lens: Secondary | ICD-10-CM | POA: Diagnosis not present

## 2019-12-18 DIAGNOSIS — M419 Scoliosis, unspecified: Secondary | ICD-10-CM | POA: Diagnosis not present

## 2019-12-18 DIAGNOSIS — M6281 Muscle weakness (generalized): Secondary | ICD-10-CM | POA: Diagnosis not present

## 2019-12-25 DIAGNOSIS — M40209 Unspecified kyphosis, site unspecified: Secondary | ICD-10-CM | POA: Diagnosis not present

## 2019-12-25 DIAGNOSIS — M419 Scoliosis, unspecified: Secondary | ICD-10-CM | POA: Diagnosis not present

## 2019-12-25 DIAGNOSIS — M6281 Muscle weakness (generalized): Secondary | ICD-10-CM | POA: Diagnosis not present

## 2019-12-30 ENCOUNTER — Encounter (HOSPITAL_BASED_OUTPATIENT_CLINIC_OR_DEPARTMENT_OTHER): Payer: Self-pay | Admitting: Surgery

## 2019-12-30 ENCOUNTER — Other Ambulatory Visit: Payer: Self-pay

## 2020-01-01 DIAGNOSIS — M6281 Muscle weakness (generalized): Secondary | ICD-10-CM | POA: Diagnosis not present

## 2020-01-01 DIAGNOSIS — M419 Scoliosis, unspecified: Secondary | ICD-10-CM | POA: Diagnosis not present

## 2020-01-02 ENCOUNTER — Telehealth: Payer: Self-pay | Admitting: Pulmonary Disease

## 2020-01-02 NOTE — Telephone Encounter (Signed)
Pt called back and said to disregard previous message. Nothing further needed.

## 2020-01-06 ENCOUNTER — Encounter (HOSPITAL_BASED_OUTPATIENT_CLINIC_OR_DEPARTMENT_OTHER)
Admission: RE | Admit: 2020-01-06 | Discharge: 2020-01-06 | Disposition: A | Discharge status: Home / Self Care | Payer: Medicare Other | Source: Ambulatory Visit | Attending: Surgery | Admitting: Surgery

## 2020-01-06 ENCOUNTER — Other Ambulatory Visit (HOSPITAL_COMMUNITY)
Admission: RE | Admit: 2020-01-06 | Discharge: 2020-01-06 | Disposition: A | Discharge status: Home / Self Care | Payer: Medicare Other | Source: Ambulatory Visit | Attending: Surgery | Admitting: Surgery

## 2020-01-06 DIAGNOSIS — Z01812 Encounter for preprocedural laboratory examination: Secondary | ICD-10-CM | POA: Diagnosis not present

## 2020-01-06 DIAGNOSIS — Z20822 Contact with and (suspected) exposure to covid-19: Secondary | ICD-10-CM | POA: Diagnosis not present

## 2020-01-06 LAB — COMPREHENSIVE METABOLIC PANEL
ALT: 15 U/L (ref 0–44)
AST: 18 U/L (ref 15–41)
Albumin: 4.2 g/dL (ref 3.5–5.0)
Alkaline Phosphatase: 42 U/L (ref 38–126)
Anion gap: 6 (ref 5–15)
BUN: 28 mg/dL — ABNORMAL HIGH (ref 8–23)
CO2: 25 mmol/L (ref 22–32)
Calcium: 9.4 mg/dL (ref 8.9–10.3)
Chloride: 107 mmol/L (ref 98–111)
Creatinine, Ser: 1.02 mg/dL — ABNORMAL HIGH (ref 0.44–1.00)
GFR, Estimated: 54 mL/min — ABNORMAL LOW (ref >60)
Glucose, Bld: 104 mg/dL — ABNORMAL HIGH (ref 70–99)
Potassium: 4.5 mmol/L (ref 3.5–5.1)
Sodium: 138 mmol/L (ref 135–145)
Total Bilirubin: 0.8 mg/dL (ref 0.3–1.2)
Total Protein: 6.7 g/dL (ref 6.5–8.1)

## 2020-01-06 LAB — SARS CORONAVIRUS 2 (TAT 6-24 HRS): SARS Coronavirus 2: NEGATIVE

## 2020-01-06 LAB — CBC WITH DIFFERENTIAL/PLATELET
Abs Immature Granulocytes: 0.37 10*3/uL — ABNORMAL HIGH (ref 0.00–0.07)
Basophils Absolute: 0.1 10*3/uL (ref 0.0–0.1)
Basophils Relative: 1 %
Eosinophils Absolute: 0.1 10*3/uL (ref 0.0–0.5)
Eosinophils Relative: 1 %
HCT: 38.4 % (ref 36.0–46.0)
Hemoglobin: 12.3 g/dL (ref 12.0–15.0)
Immature Granulocytes: 3 %
Lymphocytes Relative: 6 %
Lymphs Abs: 0.7 10*3/uL (ref 0.7–4.0)
MCH: 27.9 pg (ref 26.0–34.0)
MCHC: 32 g/dL (ref 30.0–36.0)
MCV: 87.1 fL (ref 80.0–100.0)
Monocytes Absolute: 0.8 10*3/uL (ref 0.1–1.0)
Monocytes Relative: 6 %
Neutro Abs: 10.1 10*3/uL — ABNORMAL HIGH (ref 1.7–7.7)
Neutrophils Relative %: 83 %
Platelets: 418 10*3/uL — ABNORMAL HIGH (ref 150–400)
RBC: 4.41 MIL/uL (ref 3.87–5.11)
RDW: 20.5 % — ABNORMAL HIGH (ref 11.5–15.5)
WBC: 12.1 10*3/uL — ABNORMAL HIGH (ref 4.0–10.5)
nRBC: 0 % (ref 0.0–0.2)

## 2020-01-06 NOTE — Progress Notes (Signed)

## 2020-01-09 ENCOUNTER — Other Ambulatory Visit: Payer: Self-pay

## 2020-01-09 ENCOUNTER — Encounter (HOSPITAL_BASED_OUTPATIENT_CLINIC_OR_DEPARTMENT_OTHER): Payer: Self-pay | Admitting: Surgery

## 2020-01-09 ENCOUNTER — Ambulatory Visit (HOSPITAL_BASED_OUTPATIENT_CLINIC_OR_DEPARTMENT_OTHER): Payer: Medicare Other | Admitting: Anesthesiology

## 2020-01-09 ENCOUNTER — Encounter (HOSPITAL_BASED_OUTPATIENT_CLINIC_OR_DEPARTMENT_OTHER)
Admission: RE | Disposition: A | Discharge status: Home / Self Care | Payer: Self-pay | Source: Home / Self Care | Attending: Surgery

## 2020-01-09 ENCOUNTER — Ambulatory Visit (HOSPITAL_BASED_OUTPATIENT_CLINIC_OR_DEPARTMENT_OTHER)
Admission: RE | Admit: 2020-01-09 | Discharge: 2020-01-09 | Disposition: A | Discharge status: Home / Self Care | Payer: Medicare Other | Attending: Surgery | Admitting: Surgery

## 2020-01-09 DIAGNOSIS — Z823 Family history of stroke: Secondary | ICD-10-CM | POA: Insufficient documentation

## 2020-01-09 DIAGNOSIS — Z8261 Family history of arthritis: Secondary | ICD-10-CM | POA: Insufficient documentation

## 2020-01-09 DIAGNOSIS — Z818 Family history of other mental and behavioral disorders: Secondary | ICD-10-CM | POA: Insufficient documentation

## 2020-01-09 DIAGNOSIS — Z882 Allergy status to sulfonamides status: Secondary | ICD-10-CM | POA: Diagnosis not present

## 2020-01-09 DIAGNOSIS — Z8349 Family history of other endocrine, nutritional and metabolic diseases: Secondary | ICD-10-CM | POA: Diagnosis not present

## 2020-01-09 DIAGNOSIS — Z800 Family history of malignant neoplasm of digestive organs: Secondary | ICD-10-CM | POA: Insufficient documentation

## 2020-01-09 DIAGNOSIS — D75839 Thrombocytosis, unspecified: Secondary | ICD-10-CM | POA: Diagnosis not present

## 2020-01-09 DIAGNOSIS — K4090 Unilateral inguinal hernia, without obstruction or gangrene, not specified as recurrent: Secondary | ICD-10-CM | POA: Insufficient documentation

## 2020-01-09 DIAGNOSIS — K219 Gastro-esophageal reflux disease without esophagitis: Secondary | ICD-10-CM | POA: Diagnosis not present

## 2020-01-09 DIAGNOSIS — Z888 Allergy status to other drugs, medicaments and biological substances status: Secondary | ICD-10-CM | POA: Diagnosis not present

## 2020-01-09 DIAGNOSIS — G8918 Other acute postprocedural pain: Secondary | ICD-10-CM | POA: Diagnosis not present

## 2020-01-09 HISTORY — PX: INSERTION OF MESH: SHX5868

## 2020-01-09 HISTORY — DX: Other complications of anesthesia, initial encounter: T88.59XA

## 2020-01-09 HISTORY — PX: INGUINAL HERNIA REPAIR: SHX194

## 2020-01-09 SURGERY — REPAIR, HERNIA, INGUINAL, ADULT
Anesthesia: General | Site: Groin | Laterality: Left

## 2020-01-09 MED ORDER — ROCURONIUM BROMIDE 10 MG/ML (PF) SYRINGE
PREFILLED_SYRINGE | INTRAVENOUS | Status: AC
  Filled 2020-01-09: qty 10

## 2020-01-09 MED ORDER — ONDANSETRON HCL 4 MG/2ML IJ SOLN
INTRAMUSCULAR | Status: AC
  Filled 2020-01-09: qty 2

## 2020-01-09 MED ORDER — FENTANYL CITRATE (PF) 100 MCG/2ML IJ SOLN
INTRAMUSCULAR | Status: AC
  Filled 2020-01-09: qty 2

## 2020-01-09 MED ORDER — FENTANYL CITRATE (PF) 100 MCG/2ML IJ SOLN
100.0000 ug | Freq: Once | INTRAMUSCULAR | Status: AC
  Administered 2020-01-09: 50 ug via INTRAVENOUS

## 2020-01-09 MED ORDER — LACTATED RINGERS IV SOLN: INTRAVENOUS | Status: DC

## 2020-01-09 MED ORDER — SUGAMMADEX SODIUM 200 MG/2ML IV SOLN
INTRAVENOUS | Status: DC | PRN
  Administered 2020-01-09: 100 mg via INTRAVENOUS

## 2020-01-09 MED ORDER — ACETAMINOPHEN 500 MG PO TABS
ORAL_TABLET | ORAL | Status: AC
  Filled 2020-01-09: qty 2

## 2020-01-09 MED ORDER — CHLORHEXIDINE GLUCONATE CLOTH 2 % EX PADS: 6.0000 | MEDICATED_PAD | Freq: Once | CUTANEOUS | Status: DC

## 2020-01-09 MED ORDER — BUPIVACAINE LIPOSOME 1.3 % IJ SUSP
INTRAMUSCULAR | Status: DC | PRN
  Administered 2020-01-09: 10 mL via PERINEURAL

## 2020-01-09 MED ORDER — ONDANSETRON HCL 4 MG/2ML IJ SOLN
INTRAMUSCULAR | Status: DC | PRN
  Administered 2020-01-09: 4 mg via INTRAVENOUS

## 2020-01-09 MED ORDER — DEXAMETHASONE SODIUM PHOSPHATE 10 MG/ML IJ SOLN
INTRAMUSCULAR | Status: DC | PRN
  Administered 2020-01-09: 4 mg via INTRAVENOUS

## 2020-01-09 MED ORDER — DEXAMETHASONE SODIUM PHOSPHATE 10 MG/ML IJ SOLN
INTRAMUSCULAR | Status: AC
  Filled 2020-01-09: qty 1

## 2020-01-09 MED ORDER — CEFAZOLIN SODIUM-DEXTROSE 2-4 GM/100ML-% IV SOLN: 2.0000 g | INTRAVENOUS | Status: DC

## 2020-01-09 MED ORDER — LIDOCAINE 2% (20 MG/ML) 5 ML SYRINGE
INTRAMUSCULAR | Status: AC
  Filled 2020-01-09: qty 5

## 2020-01-09 MED ORDER — BUPIVACAINE-EPINEPHRINE 0.25% -1:200000 IJ SOLN
INTRAMUSCULAR | Status: DC | PRN
  Administered 2020-01-09: 8 mL

## 2020-01-09 MED ORDER — MIDAZOLAM HCL 2 MG/2ML IJ SOLN
INTRAMUSCULAR | Status: AC
  Filled 2020-01-09: qty 2

## 2020-01-09 MED ORDER — LIDOCAINE HCL (CARDIAC) PF 100 MG/5ML IV SOSY
PREFILLED_SYRINGE | INTRAVENOUS | Status: DC | PRN
  Administered 2020-01-09: 40 mg via INTRAVENOUS

## 2020-01-09 MED ORDER — CEFAZOLIN SODIUM-DEXTROSE 2-4 GM/100ML-% IV SOLN
INTRAVENOUS | Status: AC
  Filled 2020-01-09: qty 100

## 2020-01-09 MED ORDER — PROPOFOL 10 MG/ML IV BOLUS
INTRAVENOUS | Status: DC | PRN
  Administered 2020-01-09: 10 mg via INTRAVENOUS
  Administered 2020-01-09: 100 mg via INTRAVENOUS

## 2020-01-09 MED ORDER — FENTANYL CITRATE (PF) 100 MCG/2ML IJ SOLN
INTRAMUSCULAR | Status: DC | PRN
  Administered 2020-01-09 (×2): 25 ug via INTRAVENOUS

## 2020-01-09 MED ORDER — CEFAZOLIN SODIUM-DEXTROSE 2-4 GM/100ML-% IV SOLN
2.0000 g | INTRAVENOUS | Status: AC
  Administered 2020-01-09: 2 g via INTRAVENOUS

## 2020-01-09 MED ORDER — ACETAMINOPHEN 500 MG PO TABS
1000.0000 mg | ORAL_TABLET | ORAL | Status: AC
  Administered 2020-01-09: 1000 mg via ORAL

## 2020-01-09 MED ORDER — FENTANYL CITRATE (PF) 100 MCG/2ML IJ SOLN
25.0000 ug | INTRAMUSCULAR | Status: DC | PRN
Start: 2020-01-09 — End: 2020-01-09

## 2020-01-09 MED ORDER — PROPOFOL 10 MG/ML IV BOLUS
INTRAVENOUS | Status: AC
  Filled 2020-01-09: qty 20

## 2020-01-09 MED ORDER — ROCURONIUM BROMIDE 100 MG/10ML IV SOLN
INTRAVENOUS | Status: DC | PRN
  Administered 2020-01-09: 50 mg via INTRAVENOUS

## 2020-01-09 MED ORDER — KETOROLAC TROMETHAMINE 15 MG/ML IJ SOLN
INTRAMUSCULAR | Status: DC | PRN
  Administered 2020-01-09: 15 mg via INTRAVENOUS

## 2020-01-09 MED ORDER — HYDROCODONE-ACETAMINOPHEN 5-325 MG PO TABS: 1.0000 | ORAL_TABLET | Freq: Four times a day (QID) | ORAL | 0 refills | Status: DC | PRN

## 2020-01-09 MED ORDER — BUPIVACAINE-EPINEPHRINE (PF) 0.5% -1:200000 IJ SOLN
INTRAMUSCULAR | Status: DC | PRN
  Administered 2020-01-09: 15 mL via PERINEURAL

## 2020-01-09 MED ORDER — ONDANSETRON HCL 4 MG/2ML IJ SOLN: 4.0000 mg | Freq: Once | INTRAMUSCULAR | Status: DC | PRN

## 2020-01-09 SURGICAL SUPPLY — 54 items
ADH SKN CLS APL DERMABOND .7 (GAUZE/BANDAGES/DRESSINGS) ×1
APL PRP STRL LF DISP 70% ISPRP (MISCELLANEOUS) ×1
BLADE CLIPPER SURG (BLADE) ×2 IMPLANT
BLADE SURG 15 STRL LF DISP TIS (BLADE) ×1 IMPLANT
BLADE SURG 15 STRL SS (BLADE) ×2
CANISTER SUCT 1200ML W/VALVE (MISCELLANEOUS) IMPLANT
CHLORAPREP W/TINT 26 (MISCELLANEOUS) ×2 IMPLANT
COVER BACK TABLE 60X90IN (DRAPES) ×2 IMPLANT
COVER MAYO STAND STRL (DRAPES) ×2 IMPLANT
COVER WAND RF STERILE (DRAPES) IMPLANT
DECANTER SPIKE VIAL GLASS SM (MISCELLANEOUS) ×2 IMPLANT
DERMABOND ADVANCED (GAUZE/BANDAGES/DRESSINGS) ×1
DERMABOND ADVANCED .7 DNX12 (GAUZE/BANDAGES/DRESSINGS) ×1 IMPLANT
DRAIN PENROSE 1/2X12 LTX STRL (WOUND CARE) IMPLANT
DRAPE LAPAROTOMY TRNSV 102X78 (DRAPES) ×2 IMPLANT
DRAPE UTILITY XL STRL (DRAPES) ×2 IMPLANT
ELECT COATED BLADE 2.86 ST (ELECTRODE) ×2 IMPLANT
ELECT REM PT RETURN 9FT ADLT (ELECTROSURGICAL) ×2
ELECTRODE REM PT RTRN 9FT ADLT (ELECTROSURGICAL) ×1 IMPLANT
GAUZE 4X4 16PLY RFD (DISPOSABLE) IMPLANT
GAUZE SPONGE 4X4 12PLY STRL LF (GAUZE/BANDAGES/DRESSINGS) IMPLANT
GLOVE BIOGEL PI IND STRL 7.0 (GLOVE) ×1 IMPLANT
GLOVE BIOGEL PI INDICATOR 7.0 (GLOVE) ×1
GLOVE ECLIPSE 6.5 STRL STRAW (GLOVE) ×2 IMPLANT
GLOVE ECLIPSE 8.0 STRL XLNG CF (GLOVE) ×2 IMPLANT
GLOVE SRG 8 PF TXTR STRL LF DI (GLOVE) ×1 IMPLANT
GLOVE SURG UNDER POLY LF SZ6.5 (GLOVE) ×2 IMPLANT
GLOVE SURG UNDER POLY LF SZ8 (GLOVE) ×2
GOWN STRL REUS W/ TWL LRG LVL3 (GOWN DISPOSABLE) ×1 IMPLANT
GOWN STRL REUS W/ TWL XL LVL3 (GOWN DISPOSABLE) ×1 IMPLANT
GOWN STRL REUS W/TWL LRG LVL3 (GOWN DISPOSABLE) ×2
GOWN STRL REUS W/TWL XL LVL3 (GOWN DISPOSABLE) ×2
MESH HERNIA SYS ULTRAPRO LRG (Mesh General) ×2 IMPLANT
NEEDLE HYPO 25X1 1.5 SAFETY (NEEDLE) ×2 IMPLANT
NS IRRIG 1000ML POUR BTL (IV SOLUTION) ×2 IMPLANT
PACK BASIN DAY SURGERY FS (CUSTOM PROCEDURE TRAY) ×2 IMPLANT
PENCIL SMOKE EVACUATOR (MISCELLANEOUS) ×2 IMPLANT
SLEEVE SCD COMPRESS KNEE MED (MISCELLANEOUS) ×2 IMPLANT
SPONGE LAP 4X18 RFD (DISPOSABLE) ×2 IMPLANT
STRIP CLOSURE SKIN 1/2X4 (GAUZE/BANDAGES/DRESSINGS) IMPLANT
SUT MON AB 4-0 PC3 18 (SUTURE) ×2 IMPLANT
SUT NOVA 0 T19/GS 22DT (SUTURE) IMPLANT
SUT NOVA NAB DX-16 0-1 5-0 T12 (SUTURE) ×4 IMPLANT
SUT VIC AB 2-0 SH 27 (SUTURE) ×2
SUT VIC AB 2-0 SH 27XBRD (SUTURE) ×1 IMPLANT
SUT VIC AB 3-0 54X BRD REEL (SUTURE) IMPLANT
SUT VIC AB 3-0 BRD 54 (SUTURE)
SUT VICRYL 3-0 CR8 SH (SUTURE) ×2 IMPLANT
SUT VICRYL AB 2 0 TIE (SUTURE) IMPLANT
SUT VICRYL AB 2 0 TIES (SUTURE)
SYR CONTROL 10ML LL (SYRINGE) ×2 IMPLANT
TOWEL GREEN STERILE FF (TOWEL DISPOSABLE) ×2 IMPLANT
TUBE CONNECTING 20X1/4 (TUBING) IMPLANT
YANKAUER SUCT BULB TIP NO VENT (SUCTIONS) IMPLANT

## 2020-01-09 NOTE — Anesthesia Preprocedure Evaluation (Addendum)
Anesthesia Evaluation  Patient identified by MRN, date of birth, ID band Patient awake    Reviewed: Allergy & Precautions, NPO status , Patient's Chart, lab work & pertinent test results  History of Anesthesia Complications (+) history of anesthetic complications  Airway Mallampati: II  TM Distance: >3 FB Neck ROM: Full    Dental  (+) Teeth Intact, Caps, Missing, Dental Advisory Given   Pulmonary former smoker,  Bronchiectasis Chronic cough   Pulmonary exam normal breath sounds clear to auscultation       Cardiovascular negative cardio ROS Normal cardiovascular exam Rhythm:Regular Rate:Normal     Neuro/Psych  Headaches, Glaucoma negative psych ROS   GI/Hepatic Neg liver ROS, GERD  Medicated and Controlled,  Endo/Other  negative endocrine ROS  Renal/GU negative Renal ROS  negative genitourinary   Musculoskeletal Osteopenia Scoliosis Thoracolumbar spine Left Inguinal Hernia   Abdominal Splenomegaly- 3-4 fingerbreadths below left costal margin  Peds  Hematology Essential Thrombocytosis on Hydrea since 2015   Anesthesia Other Findings   Reproductive/Obstetrics                           Anesthesia Physical Anesthesia Plan  ASA: III  Anesthesia Plan: General   Post-op Pain Management:  Regional for Post-op pain   Induction: Intravenous  PONV Risk Score and Plan: 4 or greater and Ondansetron, Treatment may vary due to age or medical condition and Dexamethasone  Airway Management Planned: LMA  Additional Equipment:   Intra-op Plan:   Post-operative Plan: Extubation in OR  Informed Consent: I have reviewed the patients History and Physical, chart, labs and discussed the procedure including the risks, benefits and alternatives for the proposed anesthesia with the patient or authorized representative who has indicated his/her understanding and acceptance.     Dental advisory  given  Plan Discussed with: CRNA and Anesthesiologist  Anesthesia Plan Comments:        Anesthesia Quick Evaluation

## 2020-01-09 NOTE — H&P (Signed)
Chelsea Garcia  Location: [facility] Surgery Patient #: [id number] DOB: 10/20/1933 Married / Language: English / Race: White Female   PM) Patient words: Patient presents for evaluation of the left groin bulge for one week. His pops in and pops out. It does cause mild to moderate discomfort especially when she is active during her physical therapy. She was seen by her physician and ultrasound was used to evaluate this. This is consistent with a left inguinal hernia. She has no nausea vomiting or change in bowel or bladder function currently. The pain is moderate in nature and the hernia pops in and pops out quite easily she states.  The patient is a 86 year old female.   Past Surgical History) Breast Biopsy Left. Cataract Surgery Left. Cesarean Section - Multiple Colon Polyp Removal - Colonoscopy Hysterectomy (not due to cancer) - Complete  Diagnostic Studies History ( PM) Colonoscopy 5-10 years ago Mammogram within last year  Allergies (Chanel Lonni Fix, [name];  Nystatin *ANTIFUNGALS* Albuterol *ANTIASTHMATIC AND BRONCHODILATOR AGENTS* Sulfamethoxazole *SULFONAMIDES* Allergies Reconciled  Medication History ([name], [name];) Azelastine HCl (0.1% Solution, Nasal) Active. Advair Diskus (250-50MCG/DOSE Aero Pow Br Act, Inhalation) Active. Benzonatate (100MG  Capsule, Oral) Active. Brimonidine Tartrate (0.15% Solution, Ophthalmic) Active. Medications Reconciled  Social History (C Alcohol use Occasional alcohol use. Caffeine use Tea. No drug use Tobacco use Former smoker.  Family History  Alcohol Abuse Brother. Arthritis Mother. Cerebrovascular Accident Brother. Depression Sister. Malignant Neoplasm Of Pancreas Father. Thyroid problems Mother.  Pregnancy / Birth History) Age at menarche 13 years. Age of menopause 38-50 Gravida 2 Length (months) of breastfeeding 3-6 Maternal age 7-25 Para 2  Other Problems Back  Pain Gastroesophageal Reflux Disease General anesthesia - complications Hemorrhoids Inguinal Hernia Migraine Headache     Review of Systems  General Present- Fatigue and Weight Loss. Not Present- Appetite Loss, Chills, Fever, Night Sweats and Weight Gain. Skin Not Present- Change in Wart/Mole, Dryness, Hives, Jaundice, New Lesions, Non-Healing Wounds, Rash and Ulcer. HEENT Present- Wears glasses/contact lenses. Not Present- Earache, Hearing Loss, Hoarseness, Nose Bleed, Oral Ulcers, Ringing in the Ears, Seasonal Allergies, Sinus Pain, Sore Throat, Visual Disturbances and Yellow Eyes. Respiratory Not Present- Bloody sputum, Chronic Cough, Difficulty Breathing, Snoring and Wheezing. Cardiovascular Present- Difficulty Breathing Lying Down, Leg Cramps and Shortness of Breath. Not Present- Chest Pain, Palpitations, Rapid Heart Rate and Swelling of Extremities. Gastrointestinal Not Present- Abdominal Pain, Bloating, Bloody Stool, Change in Bowel Habits, Chronic diarrhea, Constipation, Difficulty Swallowing, Excessive gas, Gets full quickly at meals, Hemorrhoids, Indigestion, Nausea, Rectal Pain and Vomiting. Female Genitourinary Present- Frequency, Nocturia and Urgency. Not Present- Painful Urination and Pelvic Pain. Musculoskeletal Present- Joint Stiffness and Muscle Weakness. Not Present- Back Pain, Joint Pain, Muscle Pain and Swelling of Extremities. Neurological Present- Numbness, Tremor and Weakness. Not Present- Decreased Memory, Fainting, Headaches, Seizures, Tingling and Trouble walking. Psychiatric Not Present- Anxiety, Bipolar, Change in Sleep Pattern, Depression, Fearful and Frequent crying. Endocrine Present- Hair Changes. Not Present- Cold Intolerance, Excessive Hunger, Heat Intolerance, Hot flashes and New Diabetes.  Vitals  11/18/2019 1:44 PM Weight: 114.13 lb Height: 61.5in Body Surface Area: 1.5 m Body Mass Index: 21.21 kg/m  Temp.: 97.77F  Pulse: 77  (Regular)  BP: 120/72(Sitting, Left Arm, Standard)        Physical Exam   General Mental Status-Alert. General Appearance-Consistent with stated age. Hydration-Well hydrated. Voice-Normal.  Chest and Lung Exam Chest and lung exam reveals -quiet, even and easy respiratory effort with no use of accessory muscles and on auscultation, normal  breath sounds, no adventitious sounds and normal vocal resonance. Inspection Chest Wall - Normal. Back - normal.  Cardiovascular Cardiovascular examination reveals -normal heart sounds, regular rate and rhythm with no murmurs and normal pedal pulses bilaterally.  Abdomen Note: Reducible left inguinal hernia. No evidence of running oh hernia. Large midline scar from previous surgery noted. Soft nontender.  Musculoskeletal Normal Exam - Left-Upper Extremity Strength Normal and Lower Extremity Strength Normal. Normal Exam - Right-Upper Extremity Strength Normal, Lower Extremity Weakness.    Assessment & Plan  LEFT INGUINAL HERNIA (K40.90) Impression: Patient very active and would like repair. I discussed the pros and constipation with her underlying medical problems. She will need pulmonary clearance and need to see anesthesia preop since she's had problems with anesthetics she states. Recommended open left inguinal hernia repair with mesh  The risk of hernia repair include bleeding, infection, organ injury, bowel injury, bladder injury, nerve injury recurrent hernia, blood clots, worsening of underlying condition, chronic pain, mesh use, open surgery, death, and the need for other operations. Pt agrees to proceed  total time 30 min  Current Plans You are being scheduled for surgery- Our schedulers will call you.  You should hear from our office's scheduling department within 5 working days about the location, date, and time of surgery. We try to make accommodations for patient's preferences in  scheduling surgery, but sometimes the OR schedule or the surgeon's schedule prevents [place] from making those accommodations.  If you have not heard from our office [phone]) in 5 working days, call the office and ask for your surgeon's nurse.  If you have other questions about your diagnosis, plan, or surgery, call the office and ask for your surgeon's nurse.  Pt Education - CCS Mesh education: discussed with patient and provided information. CCS Consent - Hernia Repair - Ventral/Incisional/Umbilical: discussed with patient and provided information

## 2020-01-09 NOTE — Anesthesia Procedure Notes (Signed)
Procedure Name: Intubation Date/Time: 01/09/2020 10:35 AM Performed by: Garth Bigness, CRNA Pre-anesthesia Checklist: Patient identified, Emergency Drugs available, Suction available and Patient being monitored Patient Re-evaluated:Patient Re-evaluated prior to induction Oxygen Delivery Method: Circle system utilized Preoxygenation: Pre-oxygenation with 100% oxygen Induction Type: IV induction Ventilation: Mask ventilation without difficulty Laryngoscope Size: Glidescope and 3 Grade View: Grade I Tube type: Oral Tube size: 7.0 mm Number of attempts: 1 Airway Equipment and Method: Stylet and Oral airway Placement Confirmation: ETT inserted through vocal cords under direct vision,  positive ETCO2 and breath sounds checked- equal and bilateral Secured at: 21 cm Tube secured with: Tape Dental Injury: Teeth and Oropharynx as per pre-operative assessment

## 2020-01-09 NOTE — Anesthesia Procedure Notes (Signed)
Anesthesia Regional Block: TAP block   Pre-Anesthetic Checklist: ,, timeout performed, Correct Patient, Correct Site, Correct Laterality, Correct Procedure, Correct Position, site marked, Risks and benefits discussed,  Surgical consent,  Pre-op evaluation,  At surgeon's request and post-op pain management  Laterality: Left  Prep: chloraprep       Needles:  Injection technique: Single-shot  Needle Type: Echogenic Stimulator Needle     Needle Length: 9cm  Needle Gauge: 21   Needle insertion depth: 3.5 cm   Additional Needles:   Procedures:,,,, ultrasound used (permanent image in chart),,,,  Narrative:  Start time: 01/09/2020 10:06 AM End time: 01/09/2020 10:12 AM Injection made incrementally with aspirations every 5 mL.  Performed by: Personally  Anesthesiologist: Mal Amabile, MD  Additional Notes: Timeout performed. Patient sedated. Relevant anatomy ID'd using [place]. Incremental 2-51ml injection of LA with frequent aspiration. Patient tolerated procedure well.        Left TAP Block

## 2020-01-09 NOTE — Anesthesia Postprocedure Evaluation (Signed)
Anesthesia Post Note  Patient: Chelsea Garcia  Procedure(s) Performed: LEFT INGUINAL HERNIA REPAIR WITH MESH (Left Groin) INSERTION OF MESH (Left Groin)     Patient location during evaluation: PACU Anesthesia Type: General Level of consciousness: awake and alert and oriented Pain management: pain level controlled Vital Signs Assessment: post-procedure vital signs reviewed and stable Respiratory status: spontaneous breathing, nonlabored ventilation and respiratory function stable Cardiovascular status: blood pressure returned to baseline and stable Postop Assessment: no apparent nausea or vomiting Anesthetic complications: no   No complications documented.  Last Vitals:  Vitals:   01/09/20 1209 01/09/20 1215  BP:  (!) 118/57  Pulse:  81  Resp:  16  Temp:    SpO2: 99% 94%    Last Pain:  Vitals:   01/09/20 1215  TempSrc:   PainSc: 0-No pain                 Laura Caldas A.

## 2020-01-09 NOTE — Interval H&P Note (Signed)
History and Physical Interval Note:  01/09/2020 10:02 AM  Chelsea Garcia  has presented today for surgery, with the diagnosis of LEFT INGUINAL HERNIA.  The various methods of treatment have been discussed with the patient and family. After consideration of risks, benefits and other options for treatment, the patient has consented to  Procedure(s): LEFT INGUINAL HERNIA REPAIR WITH MESH (Left) as a surgical intervention.  The patient's history has been reviewed, patient examined, no change in status, stable for surgery.  I have reviewed the patient's chart and labs.  Questions were answered to the patient's satisfaction.     Dortha Schwalbe MD

## 2020-01-09 NOTE — Discharge Instructions (Signed)
[facility] _______[facility]  UMBILICAL OR INGUINAL HERNIA REPAIR: POST OP INSTRUCTIONS  Always review your discharge instruction sheet given to you by the facility where your surgery was performed. IF YOU HAVE DISABILITY OR FAMILY LEAVE FORMS, YOU MUST BRING THEM TO THE OFFICE FOR PROCESSING.   DO NOT GIVE THEM TO YOUR DOCTOR.  1. A  prescription for pain medication may be given to you upon discharge.  Take your pain medication as prescribed, if needed.  If narcotic pain medicine is not needed, then you may take acetaminophen (Tylenol) or ibuprofen (Advil) as needed. 2. Take your usually prescribed medications unless otherwise directed. If you need a refill on your pain medication, please contact your pharmacy.  They will contact our office to request authorization. Prescriptions will not be filled after 5 pm or on week-ends. 3. You should follow a light diet the first 24 hours after arrival home, such as soup and crackers, etc.  Be sure to include lots of fluids daily.  Resume your normal diet the day after surgery. 4.Most patients will experience some swelling and bruising around the umbilicus or in the groin and scrotum.  Ice packs and reclining will help.  Swelling and bruising can take several days to resolve.  6. It is common to experience some constipation if taking pain medication after surgery.  Increasing fluid intake and taking a stool softener (such as Colace) will usually help or prevent this problem from occurring.  A mild laxative (Milk of Magnesia or Miralax) should be taken according to package directions if there are no bowel movements after 48 hours. 7. Unless discharge instructions indicate otherwise, you may remove your bandages 24-48 hours after surgery, and you may shower at that time.  You may have steri-strips (small skin tapes) in place directly over the incision.  These strips should be left on the skin for 7-10 days.  If your surgeon used skin glue on the  incision, you may shower in 24 hours.  The glue will flake off over the next 2-3 weeks.  Any sutures or staples will be removed at the office during your follow-up visit. 8. ACTIVITIES:  You may resume regular (light) daily activities beginning the next day--such as daily self-care, walking, climbing stairs--gradually increasing activities as tolerated.  You may have sexual intercourse when it is comfortable.  Refrain from any heavy lifting or straining until approved by your doctor.  a.You may drive when you are no longer taking prescription pain medication, you can comfortably wear a seatbelt, and you can safely maneuver your car and apply brakes. b.RETURN TO WORK:   _____________________________________________  9.You should see your doctor in the office for a follow-up appointment approximately 2-3 weeks after your surgery.  Make sure that you call for this appointment within a day or two after you arrive home to insure a convenient appointment time. 10.OTHER INSTRUCTIONS: _________________________    _____________________________________  WHEN TO CALL YOUR DOCTOR: 1. Fever over 101.0 2. Inability to urinate 3. Nausea and/or vomiting 4. Extreme swelling or bruising 5. Continued bleeding from incision. 6. Increased pain, redness, or drainage from the incision  The clinic staff is available to answer your questions during regular business hours.  Please don't hesitate to call and ask to speak to one of the nurses for clinical concerns.  If you have a medical emergency, go to the nearest emergency room or call 911.  A surgeon from [facility] is always on call at the hospital     [address], [address], [place], [place]  [address] ?  [address] [address], [place], [place]   [address] [phone] ? [phone] ? FAX [phone] Web site: [email or web address]  May take Tylenol after 3:50pm, if needed.  May take Ibuprofen after 6pm, if needed.    Post Anesthesia  Home Care Instructions  Activity: Get plenty of rest for the remainder of the day. A responsible individual must stay with you for 24 hours following the procedure.  For the next 24 hours, DO NOT: -Drive a car -Advertising copywriter -Drink alcoholic beverages -Take any medication unless instructed by your physician -Make any legal decisions or sign important papers.  Meals: Start with liquid foods such as gelatin or soup. Progress to regular foods as tolerated. Avoid greasy, spicy, heavy foods. If nausea and/or vomiting occur, drink only clear liquids until the nausea and/or vomiting subsides. Call your physician if vomiting continues.  Special Instructions/Symptoms: Your throat may feel dry or sore from the anesthesia or the breathing tube placed in your throat during surgery. If this causes discomfort, gargle with warm salt water. The discomfort should disappear within 24 hours.  If you had a scopolamine patch placed behind your ear for the management of post- operative nausea and/or vomiting:  1. The medication in the patch is effective for 72 hours, after which it should be removed.  Wrap patch in a tissue and discard in the trash. Wash hands thoroughly with soap and water. 2. You may remove the patch earlier than 72 hours if you experience unpleasant side effects which may include dry mouth, dizziness or visual disturbances. 3. Avoid touching the patch. Wash your hands with soap and water after contact with the patch.    Regional Anesthesia Blocks  1. Numbness or the inability to move the "blocked" extremity may last from 3-48 hours after placement. The length of time depends on the medication injected and your individual response to the medication. If the numbness is not going away after 48 hours, call your surgeon.  2. The extremity that is blocked will need to be protected until the numbness is gone and the  Strength has returned. Because you cannot feel it, you will need to take extra  care to avoid injury. Because it may be weak, you may have difficulty moving it or using it. You may not know what position it is in without looking at it while the block is in effect.  3. For blocks in the legs and feet, returning to weight bearing and walking needs to be done carefully. You will need to wait until the numbness is entirely gone and the strength has returned. You should be able to move your leg and foot normally before you try and bear weight or walk. You will need someone to be with you when you first try to ensure you do not fall and possibly risk injury.  4. Bruising and tenderness at the needle site are common side effects and will resolve in a few days.  5. Persistent numbness or new problems with movement should be communicated to the surgeon or the [facility] [phone] [facility] [phone]).Information for Discharge Teaching: EXPAREL (bupivacaine liposome injectable suspension)   Your surgeon or anesthesiologist gave you EXPAREL(bupivacaine) to help control your pain after surgery.   EXPAREL is a local anesthetic that provides pain relief by numbing the tissue around the surgical site.  EXPAREL is designed to release pain medication over time and can control pain for  up to 72 hours.  Depending on how you respond to EXPAREL, you may require less pain medication during your recovery.  Possible side effects:  Temporary loss of sensation or ability to move in the area where bupivacaine was injected.  Nausea, vomiting, constipation  Rarely, numbness and tingling in your mouth or lips, lightheadedness, or anxiety may occur.  Call your doctor right away if you think you may be experiencing any of these sensations, or if you have other questions regarding possible side effects.  Follow all other discharge instructions given to you by your surgeon or nurse. Eat a healthy diet and drink plenty of water or other fluids.  If you return to  the hospital for any reason within 96 hours following the administration of EXPAREL, it is important for health care providers to know that you have received this anesthetic. A teal colored band has been placed on your arm with the date, time and amount of EXPAREL you have received in order to alert and inform your health care providers. Please leave this armband in place for the full 96 hours following administration, and then you may remove the band.

## 2020-01-09 NOTE — Transfer of Care (Signed)
Immediate Anesthesia Transfer of Care Note  Patient: Hassel Neth  Procedure(s) Performed: LEFT INGUINAL HERNIA REPAIR WITH MESH (Left Groin) INSERTION OF MESH (Left Groin)  Patient Location: PACU  Anesthesia Type:General  Level of Consciousness: oriented, drowsy and patient cooperative  Airway & Oxygen Therapy: Patient Spontanous Breathing and Patient connected to face mask oxygen  Post-op Assessment: Report given to RN and Post -op Vital signs reviewed and stable  Post vital signs: Reviewed and stable  Last Vitals:  Vitals Value Taken Time  BP 126/63 01/09/20 1154  Temp    Pulse 84 01/09/20 1155  Resp 16 01/09/20 1155  SpO2 100 % 01/09/20 1155  Vitals shown include unvalidated device data.  Last Pain:  Vitals:   01/09/20 0939  TempSrc: Oral  PainSc: 0-No pain      Patients Stated Pain Goal: 4 (01/09/20 0939)  Complications: No complications documented.

## 2020-01-09 NOTE — Progress Notes (Signed)
Assisted Dr. Royce Macadamia with left, ultrasound guided, transabdominal plane block. Side rails up, monitors on throughout procedure. See vital signs in flow sheet. Tolerated Procedure well.

## 2020-01-09 NOTE — Op Note (Signed)
Left inguinal hernia, Open, Procedure Note with mesh  Indications: Patient presents for repair of symptomatic left inguinal hernia.  Pros and cons of repair as well as observation discussed with her given her advanced age.  She was quite functional and is was interfering with her quality of life.  After discussion of techniques of open repair was laparoscopic with and without mesh it was felt that open left inguinal hernia pair with mesh would be the best for her.  Risk, benefits and alternative treatments were discussed.  She agreed to proceed.The risk of hernia repair include bleeding,  Infection,   Recurrence of the hernia,  Mesh use, chronic pain,  Organ injury,  Bowel injury,  Bladder injury,   nerve injury with numbness around the incision,  Death,  and worsening of preexisting  medical problems.  The alternatives to surgery have been discussed as well..  Long term expectations of both operative and non operative treatments have been discussed.   The patient agrees to proceed.  Pre-operative Diagnosis: left reducible inguinal hernia  Post-operative Diagnosis: same  Surgeon: Dortha Schwalbe  MD  Assistants: Or staff  Anesthesia: General endotracheal anesthesia and TA P block  ASA Class: 2  Procedure Details  The patient was seen again in the Holding Room. The risks, benefits, complications, treatment options, and expected outcomes were discussed with the patient. The possibilities of reaction to medication, pulmonary aspiration, perforation of viscus, bleeding, recurrent infection, the need for additional procedures, and development of a complication requiring transfusion or further operation were discussed with the patient and/or family. There was concurrence with the proposed plan, and informed consent was obtained. The site of surgery was properly noted/marked. The patient was taken to the Operating Room, identified as Chelsea Garcia, and the procedure verified as hernia repair. A Time Out was  held and the above information confirmed.  The patient was placed in the supine position and underwent induction of anesthesia, the lower abdomen and groin was prepped and draped in the standard fashion, and 0.25% Marcaine with epinephrine was used to anesthetize the skin over the mid-portion of the inguinal canal. A transverse incision was made. Dissection was carried through the soft tissue to expose the inguinal canal and inguinal ligament along its lower edge. The external oblique fascia was split along the course of its fibers, exposing the inguinal canal. The round ligament identified and divided.  The ilioinguinal nerve identified and divided as muscle to prevent postoperative contracted.. The indirect defect was exposed and a piece of prolene hernia system ultrapro mesh was and placed  into the defect. Interupted 1-0 novafil suture was then used  to repair the defect, with the suture being sewn from the pubic tubercle inferiorly and superiorly along the canal to a level just beyond the internal ring.  Hemostasis achieved.  There is no undue tension on the repair and the mesh cover the defect nicely.  The external oblique f was then closed in a continuous fashion using 2-0 Vicryl suture taking care not to cause entrapment. Scarpa's layer closed with 3 0 vicryl and 4 0 monocryl used to close the skin.  Dermabond used for dressing.  Instrument, sponge, and needle counts were correct prior to closure and at the conclusion of the case.  Findings: Hernia as above  Estimated Blood Loss: Minimal         Drains: None         Total IV Fluids: Per anesthesia record  Specimens: None               Complications: None; patient tolerated the procedure well.         Disposition: PACU - hemodynamically stable.         Condition: stable

## 2020-01-13 ENCOUNTER — Encounter (HOSPITAL_BASED_OUTPATIENT_CLINIC_OR_DEPARTMENT_OTHER): Payer: Self-pay | Admitting: Surgery

## 2020-01-13 NOTE — Addendum Note (Signed)
Addendum  created 01/13/20 0804 by Amor Hyle, Jewel Baize, CRNA   Charge Capture section accepted

## 2020-01-14 ENCOUNTER — Telehealth: Payer: Self-pay | Admitting: Hematology

## 2020-01-14 NOTE — Telephone Encounter (Signed)
Rescheduled appointment per 1/4 schedule message. Patient is aware of changes. 

## 2020-01-17 ENCOUNTER — Ambulatory Visit: Payer: Medicare Other | Admitting: Hematology

## 2020-01-17 ENCOUNTER — Other Ambulatory Visit: Payer: Medicare Other

## 2020-01-21 ENCOUNTER — Other Ambulatory Visit: Payer: Self-pay

## 2020-01-21 ENCOUNTER — Encounter: Payer: Self-pay | Admitting: Pulmonary Disease

## 2020-01-21 ENCOUNTER — Ambulatory Visit (INDEPENDENT_AMBULATORY_CARE_PROVIDER_SITE_OTHER): Payer: Medicare Other | Admitting: Pulmonary Disease

## 2020-01-21 VITALS — BP 120/80 | HR 75 | Temp 97.3°F | Ht 60.0 in | Wt 114.0 lb

## 2020-01-21 DIAGNOSIS — J479 Bronchiectasis, uncomplicated: Secondary | ICD-10-CM

## 2020-01-21 NOTE — Progress Notes (Signed)
Subjective:   PATIENT ID: Chelsea Garcia GENDER: female DOB: Oct 20, 1933, MRN: [id number]   HPI  Chief Complaint  Patient presents with  . Follow-up    The advair makes her hands shake.  She recently had abd surgery the end of December and has been recuperating from that at home.      Reason for Visit: Follow-up bronchiectasis  [name] is an 87 year old female former smoker who presents for follow-up for bronchiectasis.  Synopsis: Referred to Pulmonary clinic for CT demonstrating bronchiectasis and tree-in-bud opacities. She has long history of cough, wheezing and chest congestion that has worsened in the summer of 2021.   Since our last visit we had decreased Advair and she reports symptoms are better controlled. She is now walking ~1/2 mile. Shortness of breath is improved. She is using flutter valve every other with productive cough but otherwise her cough is dry. She is not feeling as congested. Completed with physical therapy after surgery. Denies chest pain, chest tightness or wheezing.  Social History: Former smoker. Quit in 1963.  I have personally reviewed patient's past medical/family/social history/allergies/current medications.  Past Medical History:  Diagnosis Date  . Colon polyps    Benign. 2 colonoscopies in past. Due now again.  . Complication of anesthesia    "got dizzy in 70's"  . Endometriosis    required TAH when young  . Essential tremor    since 40's  . GERD (gastroesophageal reflux disease)    denis  . Glaucoma    started in 40's  . Low back pain    since 40's  . Migraine    started in 50's  . Osteopenia    2014  . Seasonal allergies    to Dust, mold and pollen.  . Thrombocytosis September 2015  . Tick bites    she likes out door activities, 2 this year (2015)     Allergies  Allergen Reactions  . Nystatin     Swelling of lips  . Bee Pollen Other (See Comments) and Cough    Mill dust, pollen. Mill dust, pollen.  . Pollen Extract  Cough    Mill dust, pollen.  . Albuterol     Increased HR, had to go to the ER  . Mite (D. Farinae) Other (See Comments)  . Sulfamethoxazole Other (See Comments)    Irritation in eyes  Other Other     Outpatient Medications Prior to Visit  Medication Sig Dispense Refill  . acetaminophen (TYLENOL) 500 MG tablet Take 500 mg by mouth every 6 (six) hours as needed.    . brimonidine (ALPHAGAN) 0.15 % ophthalmic solution 1 drop 2 (two) times daily.    . dorzolamidel-timolol (COSOPT) 22.3-6.8 MG/ML SOLN ophthalmic solution     . Fluticasone-Salmeterol (ADVAIR DISKUS) 100-50 MCG/DOSE AEPB Inhale 1 puff into the lungs in the morning and at bedtime. 60 each 12  . hydroxyurea (HYDREA) 500 MG capsule TAKE ONE CAPSULE BY MOUTH DAILY WITH FOOD TO MINIMIZE GI SIDE EFFECTS (Patient taking differently: 500 mg. 1 time per week) 90 capsule 2  . Netarsudil Dimesylate 0.02 % SOLN INT 1 GTT INTO OU QD    . HYDROcodone-acetaminophen (NORCO/VICODIN) 5-325 MG tablet Take 1 tablet by mouth every 6 (six) hours as needed for moderate pain. (Patient not taking: Reported on 01/21/2020) 15 tablet 0   No facility-administered medications prior to visit.    Review of Systems  Constitutional: Negative for chills, diaphoresis, fever, malaise/fatigue and weight loss.  HENT: Positive for congestion.   Respiratory: Positive for cough and shortness of breath. Negative for hemoptysis, sputum production and wheezing.   Cardiovascular: Negative for chest pain, palpitations and leg swelling.     Objective:   Vitals:   01/21/20 1344  BP: 120/80  Pulse: 75  Temp: (!) 97.3 F (36.3 C)  TempSrc: Tympanic  SpO2: 97%  Weight: 114 lb (51.7 kg)  Height: 5' (1.524 m)   SpO2: 97 %  Physical Exam: General: Well-appearing, no acute distress HENT: [place], AT Eyes: EOMI, no scleral icterus Respiratory: Clear to auscultation bilaterally.  No crackles, wheezing or rales Cardiovascular: RRR, -M/R/G, no  JVD Extremities:-Edema,-tenderness Neuro: AAO x4, CNII-XII grossly intact Skin: Intact, no rashes or bruising Psych: Normal mood, normal affect  Data Reviewed:  Imaging: CT 07/25/19 has bronchiectasis involving in the right middle and lingula associated with volume loss, scarring and few tree-in-bud opacities  PFT: None on file  Labs: CBC    Component Value Date/Time   WBC 12.1 (H) 01/06/2020 1200   RBC 4.41 01/06/2020 1200   HGB 12.3 01/06/2020 1200   HGB 11.1 (L) 10/17/2019 1018   HGB 12.4 12/16/2016 0908   HCT 38.4 01/06/2020 1200   HCT 38.6 12/16/2016 0908   PLT 418 (H) 01/06/2020 1200   PLT 400 10/17/2019 1018   PLT 271 12/16/2016 0908   MCV 87.1 01/06/2020 1200   MCV 101.8 (H) 12/16/2016 0908   MCH 27.9 01/06/2020 1200   MCHC 32.0 01/06/2020 1200   RDW 20.5 (H) 01/06/2020 1200   RDW 15.9 (H) 12/16/2016 0908   LYMPHSABS 0.7 01/06/2020 1200   LYMPHSABS 0.6 (L) 12/16/2016 0908   MONOABS 0.8 01/06/2020 1200   MONOABS 0.3 12/16/2016 0908   EOSABS 0.1 01/06/2020 1200   EOSABS 0.1 12/16/2016 0908   BASOSABS 0.1 01/06/2020 1200   BASOSABS 0.0 12/16/2016 0908   BMET    Component Value Date/Time   NA 138 01/06/2020 1200   NA 142 09/16/2016 0905   K 4.5 01/06/2020 1200   K 4.0 09/16/2016 0905   CL 107 01/06/2020 1200   CO2 25 01/06/2020 1200   CO2 23 09/16/2016 0905   GLUCOSE 104 (H) 01/06/2020 1200   GLUCOSE 107 09/16/2016 0905   BUN 28 (H) 01/06/2020 1200   BUN 18.3 09/16/2016 0905   CREATININE 1.02 (H) 01/06/2020 1200   CREATININE 0.8 09/16/2016 0905   CALCIUM 9.4 01/06/2020 1200   CALCIUM 9.8 09/16/2016 0905   GFRNONAA 54 (L) 01/06/2020 1200   GFRAA >60 07/17/2019 1005   Imaging, labs and test noted above have been reviewed independently by me.  Assessment & Plan:   Discussion: 87 year old female with bronchiectasis. Suspect etiology may be related to prior/recurrent infections. Symptoms improved on current regimen. Less adverse side effects after  decreasing ICS portion of bronchodilators.  Bronchiectasis without complication - well-controlled --CONTINUE Advair 100-50 mcg. Take ONE puff TWICE a day --CONTINUE Flutter valve as needed up to 10 x daily  --CONTINUE mucinex as needed. Remember to take a break between courses to determine if you still need it.  --Antibiotics are ok as needed. Avoid azithromycin as you may need this antibiotic in the future  --Please check with Sp[facility] Rehab to see if they will accept a Pulmonary referral. You may have to contact your surgeon. You would also benefit from a Pulmonary Rehab.  We will fax notes to Dr. Koriala, [place] Maintenance Immunization History  Administered Date(s) Administered  .  Fluad Quad(high Dose 65+) 10/09/2019  . Influenza Split 11/22/2007, 11/10/2008, 11/06/2009  . Influenza,inj,Quad PF,6+ Mos 10/30/2015, 11/03/2016, 09/23/2019  . Influenza,inj,quad, With Preservative 09/12/2013, 10/09/2014  . Influenza-Unspecified 10/11/2013  . PFIZER SARS-COV-2 Vaccination 01/15/2019, 02/20/2019, 03/20/2019, 10/16/2019  . Pneumococcal Conjugate-13 10/09/2014  . Pneumococcal Polysaccharide-23 01/16/2003  . Td 07/19/1999  . Tdap 08/08/2011, 05/27/2019  . Zoster 12/27/2006   CT Lung Screen - not qualified  No orders of the defined types were placed in this encounter.  No orders of the defined types were placed in this encounter.   Return in about 3 months (around 04/20/2020).   [place], MD [facility] 01/21/2020 2:03 PM  Office Number [phone]

## 2020-01-21 NOTE — Patient Instructions (Addendum)
Bronchiectasis without complication --CONTINUE Advair 100-50 mcg. Take ONE puff TWICE a day --CONTINUE Flutter valve as needed up to 10 x daily  --CONTINUE mucinex as needed. Remember to take a break between courses to determine if you still need it.  --Antibiotics are ok as needed. Avoid azithromycin as you may need this antibiotic in the future  --Please check with Sp[facility] Re[facility] to see if they will accept a Pulmonary referral. You may have to contact your surgeon. You would also benefit from a Pulmonary Rehab.  We will fax notes to Dr. [facility]

## 2020-01-24 ENCOUNTER — Telehealth: Payer: Self-pay | Admitting: Pulmonary Disease

## 2020-01-24 NOTE — Telephone Encounter (Signed)
Spoke with the pt  She is asking about rehab referral  Per ov note from 01/21/20--  Bronchiectasis without complication --CONTINUE Advair 100-50 mcg. Take ONE puff TWICE a day --CONTINUE Flutter valve as needed up to 10 x daily  --CONTINUE mucinex as needed. Remember to take a break between courses to determine if you still need it.  --Antibiotics are ok as needed. Avoid azithromycin as you may need this antibiotic in the future  --Please check with Sp[facility] Rehab to see if they will accept a Pulmonary referral. You may have to contact your surgeon. You would also benefit from a Pulmonary Rehab.  Advised pt looks like she needs to check with spine rehab about pulmonary referral. She will call us and let us know once she finds something out. Nothing further needed.

## 2020-02-03 NOTE — Progress Notes (Signed)
Chelsea Garcia   Telephone:([phone]) [phone] [phone]) [phone]   Clinic Follow up Note   Patient Care Team: Lujean Amel, MD as PCP - General (Family Medicine) Bernadene Bell, MD (Inactive) as Consulting Physician (Hematology)  Date of Service:  02/05/2020  CHIEF COMPLAINT: F/u of Essential Thrombocytosis  SUMMARY OF ONCOLOGIC HISTORY: Oncology History  Essential thrombocytosis ([place])  10/01/2013 Initial Diagnosis   Essential thrombocytosis      CURRENT THERAPY:  -Hydroxyuria 569m daily started 11/27/2013. Decreased to 5060mM-F in 12/2017, further decrease to 50060mWF on 07/06/2018. Reduced to 500m12m2 days a weekstarting 12/25/18.Reduced to 500mg36me weekly starting 03/18/19.Held since 02/05/19 due to well controlled ET.  -Aspirin 81mg 43my  INTERVAL HISTORY:  Chelsea Garcia for a follow up of ET. She was last seen by me 6 months ago. She presents to the clinic alone. She underwent left inguinal hernia repair in 12/2019. She has recovered well and say her surgeon yesterday. She is tolerating Hydrea 500mg o22ma week well.     REVIEW OF SYSTEMS:   Constitutional: Denies fevers, chills or abnormal weight loss Eyes: Denies blurriness of vision Ears, nose, mouth, throat, and face: Denies mucositis or sore throat Respiratory: Denies cough, dyspnea or wheezes Cardiovascular: Denies palpitation, chest discomfort or lower extremity swelling Gastrointestinal:  Denies nausea, heartburn or change in bowel habits Skin: Denies abnormal skin rashes Lymphatics: Denies new lymphadenopathy or easy bruising Neurological:Denies numbness, tingling or new weaknesses Behavioral/Psych: Mood is stable, no new changes  All other systems were reviewed with the patient and are negative.  MEDICAL HISTORY:  Past Medical History:  Diagnosis Date  . Colon polyps    Benign. 2 colonoscopies in past. Due now again.  . Complication of anesthesia    "got dizzy in 70's"  .  Endometriosis    required TAH when young  . Essential tremor    since 40's  . GERD (gastroesophageal reflux disease)    denis  . Glaucoma    started in 40's  . Low back pain    since 40's  . Migraine    started in 50's  . Osteopenia    2014  . Seasonal allergies    to Dust, mold and pollen.  . Thrombocytosis September 2015  . Tick bites    she likes out door activities, 2 this year (2015)    SURGICAL HISTORY: Past Surgical History:  Procedure Laterality Date  . CATARACT EXTRACTION Left 02/07/2013  . INGUINAL HERNIA REPAIR Left 01/09/2020   Procedure: LEFT INGUINAL HERNIA REPAIR WITH MESH;  Surgeon: Cornett[facility]roll [facility];  Laterality: Left;  . INSERTION OF MESH Left 01/09/2020   Procedure: INSERTION OF MESH;  Surgeon: CornettErroll [facility];  Laterality: Left;  . TOTAL ABDOMINAL HYSTERECTOMY  1970's  . TUBAL LIGATION      I have reviewed the social history and family history with the patient and they are unchanged from previous note.  ALLERGIES:  is allergic to nystatin, bee pollen, pollen extract, albuterol, mite (d. farinae), and sulfamethoxazole.  MEDICATIONS:  Current Outpatient Medications  Medication Sig Dispense Refill  . acetaminophen (TYLENOL) 500 MG tablet Take 500 mg by mouth every 6 (six) hours as needed.    . brimonidine (ALPHAGAN) 0.15 % ophthalmic solution 1 drop 2 (two) times daily.    . dorzolamidel-timolol (COSOPT) 22.3-6.8 MG/ML SOLN ophthalmic solution     .  Fluticasone-Salmeterol (ADVAIR DISKUS) 100-50 MCG/DOSE AEPB Inhale 1 puff into the lungs in the morning and at bedtime. 60 each 12  . hydroxyurea (HYDREA) 500 MG capsule TAKE ONE CAPSULE BY MOUTH DAILY WITH FOOD TO MINIMIZE GI SIDE EFFECTS (Patient taking differently: 500 mg. 1 time per week) 90 capsule 2  . Netarsudil Dimesylate 0.02 % SOLN INT 1 GTT INTO OU QD     No current  facility-administered medications for this visit.    PHYSICAL EXAMINATION: ECOG PERFORMANCE STATUS: 1 - Symptomatic but completely ambulatory  Vitals:   02/05/20 1058  BP: 125/63  Pulse: 73  Resp: 13  Temp: 97.8 F (36.6 C)  SpO2: 99%   Filed Weights   02/05/20 1058  Weight: 113 lb 3.2 oz (51.3 kg)    Due to COVID19 we will limit examination to appearance. Patient had no complaints.  GENERAL:alert, no distress and comfortable SKIN: skin color normal, no rashes or significant lesions EYES: normal, Conjunctiva are pink and non-injected, sclera clear  NEURO: alert & oriented x 3 with fluent speech   LABORATORY DATA:  I have reviewed the data as listed CBC Latest Ref Rng & Units 02/05/2020 01/06/2020 10/17/2019  WBC 4.0 - 10.5 K/uL 11.2(H) 12.1(H) 9.3  Hemoglobin 12.0 - 15.0 g/dL 11.4(L) 12.3 11.1(L)  Hematocrit 36.0 - 46.0 % 36.4 38.4 36.5  Platelets 150 - 400 K/uL 381 418(H) 400     CMP Latest Ref Rng & Units 01/06/2020 10/17/2019 07/17/2019  Glucose 70 - 99 mg/dL 104(H) 103(H) 90  BUN 8 - 23 mg/dL 28(H) 30(H) 16  Creatinine 0.44 - 1.00 mg/dL 1.02(H) 1.01(H) 0.92  Sodium 135 - 145 mmol/L 138 139 140  Potassium 3.5 - 5.1 mmol/L 4.5 4.3 4.1  Chloride 98 - 111 mmol/L 107 107 110  CO2 22 - 32 mmol/L 25 28 24   Calcium 8.9 - 10.3 mg/dL 9.4 9.4 9.3  Total Protein 6.5 - 8.1 g/dL 6.7 6.8 6.8  Total Bilirubin 0.3 - 1.2 mg/dL 0.8 0.5 0.5  Alkaline Phos 38 - 126 U/L 42 47 45  AST 15 - 41 U/L 18 15 14(L)  ALT 0 - 44 U/L 15 11 11       RADIOGRAPHIC STUDIES: I have personally reviewed the radiological images as listed and agreed with the findings in the report. No results found.   ASSESSMENT & PLAN:  Chelsea Garcia is a 87 y.o. female with   1. Essential thrombocytosis, JAK2(+) -Diagnosed in 11/2015with plt initially in 600 range.Due to her advanced age, she is at high risk for thrombosis secondary to ET.We previously discussed the goal of therapy is palliative, and prevent  thrombosis. Unfortunately it is not curable, and there is a small risk in the involved to acute leukemia and myelofibrosis down the road. -Ipreviouslydiscussedthe possibility ofher ETevolvingtomyelofibrosis leukemia, so far her WBC has been normal, if the concerns arises in future, I mayrecommend repeatingbone marrow biopsy to further evaluate.  -She has been taking hydreasince 2015, currently on 580monce weekly due to anemia.Continue aspirin 81 mg once daily. -She is clinically doing well. She is tolerating Hydrea well. CBC reviewed, WBC 11.2, Hg 11.4, ANC 9.3, plt have been mostly normal and at 381K today.  -I discussed with overall controlled platelet level on once weekly Hydrea. Given the very low dose Hydrea, I discussed stopping Hydrea and watching her blood counts for now. If her platelets increase over 500K, I would recommend restarting hydrea. She is agreeable.  -Monitor with labs monthly and f/u in 3 months.  If she remains stable will monitor every 3-4 months.   2.IBS, Acid Reflux, Scoliosis   -Her CT CAP from 03/2017 showeddiverticulosis and moderate splenomegaly. -Her IBS isoverall mild and manageable on lower dose hydrea.  -Stable   PLAN: -Given well controlled ET and very low dose Hydrea, will stop Hydrea and watch  -Lab monthly X3  -F/u in 3 months   No problem-specific Assessment & Plan notes found for this encounter.   No orders of the defined types were placed in this encounter.  All questions were answered. The patient knows to call the clinic with any problems, questions or concerns. No barriers to learning was detected. The total time spent in the appointment was 25 minutes.     Truitt Merle, MD 02/05/2020   I, Joslyn Devon, am acting as scribe for Truitt Merle, MD.   I have reviewed the above documentation for accuracy and completeness, and I agree with the above.

## 2020-02-05 ENCOUNTER — Inpatient Hospital Stay: Payer: Medicare Other | Attending: Hematology

## 2020-02-05 ENCOUNTER — Other Ambulatory Visit: Payer: Self-pay

## 2020-02-05 ENCOUNTER — Encounter: Payer: Self-pay | Admitting: Hematology

## 2020-02-05 ENCOUNTER — Inpatient Hospital Stay (HOSPITAL_BASED_OUTPATIENT_CLINIC_OR_DEPARTMENT_OTHER): Payer: Medicare Other | Admitting: Hematology

## 2020-02-05 ENCOUNTER — Telehealth: Payer: Self-pay | Admitting: Pulmonary Disease

## 2020-02-05 VITALS — BP 125/63 | HR 73 | Temp 97.8°F | Resp 13 | Ht 60.0 in | Wt 113.2 lb

## 2020-02-05 DIAGNOSIS — D473 Essential (hemorrhagic) thrombocythemia: Secondary | ICD-10-CM | POA: Diagnosis not present

## 2020-02-05 DIAGNOSIS — K219 Gastro-esophageal reflux disease without esophagitis: Secondary | ICD-10-CM | POA: Insufficient documentation

## 2020-02-05 DIAGNOSIS — D649 Anemia, unspecified: Secondary | ICD-10-CM | POA: Diagnosis not present

## 2020-02-05 DIAGNOSIS — D75839 Thrombocytosis, unspecified: Secondary | ICD-10-CM | POA: Diagnosis not present

## 2020-02-05 DIAGNOSIS — Z90710 Acquired absence of both cervix and uterus: Secondary | ICD-10-CM | POA: Diagnosis not present

## 2020-02-05 DIAGNOSIS — M419 Scoliosis, unspecified: Secondary | ICD-10-CM | POA: Diagnosis not present

## 2020-02-05 DIAGNOSIS — J479 Bronchiectasis, uncomplicated: Secondary | ICD-10-CM

## 2020-02-05 DIAGNOSIS — K589 Irritable bowel syndrome without diarrhea: Secondary | ICD-10-CM | POA: Insufficient documentation

## 2020-02-05 DIAGNOSIS — Z86010 Personal history of colonic polyps: Secondary | ICD-10-CM | POA: Diagnosis not present

## 2020-02-05 LAB — CBC WITH DIFFERENTIAL (CANCER CENTER ONLY)
Abs Immature Granulocytes: 0.35 10*3/uL — ABNORMAL HIGH (ref 0.00–0.07)
Basophils Absolute: 0.1 10*3/uL (ref 0.0–0.1)
Basophils Relative: 1 %
Eosinophils Absolute: 0.1 10*3/uL (ref 0.0–0.5)
Eosinophils Relative: 1 %
HCT: 36.4 % (ref 36.0–46.0)
Hemoglobin: 11.4 g/dL — ABNORMAL LOW (ref 12.0–15.0)
Immature Granulocytes: 3 %
Lymphocytes Relative: 6 %
Lymphs Abs: 0.7 10*3/uL (ref 0.7–4.0)
MCH: 28.1 pg (ref 26.0–34.0)
MCHC: 31.3 g/dL (ref 30.0–36.0)
MCV: 89.9 fL (ref 80.0–100.0)
Monocytes Absolute: 0.7 10*3/uL (ref 0.1–1.0)
Monocytes Relative: 6 %
Neutro Abs: 9.3 10*3/uL — ABNORMAL HIGH (ref 1.7–7.7)
Neutrophils Relative %: 83 %
Platelet Count: 381 10*3/uL (ref 150–400)
RBC: 4.05 MIL/uL (ref 3.87–5.11)
RDW: 19.9 % — ABNORMAL HIGH (ref 11.5–15.5)
WBC Count: 11.2 10*3/uL — ABNORMAL HIGH (ref 4.0–10.5)
nRBC: 0 % (ref 0.0–0.2)

## 2020-02-05 NOTE — Telephone Encounter (Signed)
Called and spoke with pt and she is aware of referral has been placed for pulm rehab.  Nothing further is needed.

## 2020-02-07 ENCOUNTER — Telehealth: Payer: Self-pay | Admitting: Hematology

## 2020-02-07 ENCOUNTER — Encounter (HOSPITAL_COMMUNITY): Payer: Self-pay | Admitting: *Deleted

## 2020-02-07 NOTE — Progress Notes (Signed)
Received referral from Dr. Loanne Drilling for this pt to participate in pulmonary rehab with the the diagnosis of Bronchiectasis. Clinical review of pt follow up appt on 1/11 Pulmonary office note. Pt had left inguinal hernia repair on 12/30 by Dr.Cornett.  Per progress note with Dr. Burr Medico, pt completed her post op appt on 1/25.  Unable to see notes in epic. Will seek guidance for activity restrictions and lifting precautions at this point in her recovery.  Clearance letter faxed to Dr. Brantley Stage office for completion.  Pt with Covid Risk Score - 4. Pt appropriate for scheduling for Pulmonary rehab.  Will forward to support staff for scheduling and verification of insurance eligibility/benefits with pt consent. Cherre Huger, BSN Cardiac and Training and development officer

## 2020-02-07 NOTE — Telephone Encounter (Signed)
Scheduled appointments per 1/26 los. Called patient, no answer. Left message with appointments dates and times.  °

## 2020-02-10 ENCOUNTER — Telehealth (HOSPITAL_COMMUNITY): Payer: Self-pay

## 2020-02-10 NOTE — Telephone Encounter (Signed)
Pt insurance is active and benefits verified through Medicare a/b Co-pay 0, DED $233/$233 met, out of pocket 0/0 met, co-insurance 20%. no pre-authorization required.  2ndary insurance is active and benefits verified through [employer]. Co-pay 0, DED 0/0 met, out of pocket 0/0 met, co-insurance 0. No pre-authorization required. 

## 2020-02-18 ENCOUNTER — Other Ambulatory Visit: Payer: Self-pay

## 2020-02-18 ENCOUNTER — Encounter: Payer: Self-pay | Admitting: Podiatry

## 2020-02-18 ENCOUNTER — Ambulatory Visit (INDEPENDENT_AMBULATORY_CARE_PROVIDER_SITE_OTHER): Payer: Medicare Other | Admitting: Podiatry

## 2020-02-18 DIAGNOSIS — D473 Essential (hemorrhagic) thrombocythemia: Secondary | ICD-10-CM | POA: Diagnosis not present

## 2020-02-18 DIAGNOSIS — M79609 Pain in unspecified limb: Secondary | ICD-10-CM

## 2020-02-18 DIAGNOSIS — B351 Tinea unguium: Secondary | ICD-10-CM | POA: Diagnosis not present

## 2020-02-18 NOTE — Progress Notes (Signed)
This patient returns to the office for evaluation and treatment of long thick painful nails .  This patient is unable to trim her own nails since the patient cannot reach her feet.  Patient says the nails are painful walking and wearing his shoes.  He returns for preventive foot care services.  General Appearance  Alert, conversant and in no acute stress.  Vascular  Dorsalis pedis and posterior tibial  pulses are palpable  bilaterally.  Capillary return is within normal limits  bilaterally. Temperature is within normal limits  bilaterally.  Neurologic  Senn-Weinstein monofilament wire test within normal limits  bilaterally. Muscle power within normal limits bilaterally.  Nails Thick disfigured discolored nails with subungual debris  from hallux to fifth toes bilaterally. No evidence of bacterial infection or drainage bilaterally.  Orthopedic  No limitations of motion  feet .  No crepitus or effusions noted.  No bony pathology or digital deformities noted.  Skin  normotropic skin with no porokeratosis noted bilaterally.  No signs of infections or ulcers noted.     Onychomycosis  Pain in toes right foot  Pain in toes left foot  Debridement  of nails  1-5  B/L with a nail nipper.  Nails were then filed using a dremel tool with no incidents.    RTC  12 weeks    Gardiner Barefoot DPM

## 2020-03-04 ENCOUNTER — Other Ambulatory Visit: Payer: Self-pay

## 2020-03-04 ENCOUNTER — Inpatient Hospital Stay: Payer: Medicare Other | Attending: Hematology

## 2020-03-04 DIAGNOSIS — D649 Anemia, unspecified: Secondary | ICD-10-CM | POA: Insufficient documentation

## 2020-03-04 DIAGNOSIS — D75839 Thrombocytosis, unspecified: Secondary | ICD-10-CM | POA: Diagnosis not present

## 2020-03-04 DIAGNOSIS — D473 Essential (hemorrhagic) thrombocythemia: Secondary | ICD-10-CM

## 2020-03-04 DIAGNOSIS — Z86010 Personal history of colonic polyps: Secondary | ICD-10-CM | POA: Insufficient documentation

## 2020-03-04 DIAGNOSIS — K219 Gastro-esophageal reflux disease without esophagitis: Secondary | ICD-10-CM | POA: Insufficient documentation

## 2020-03-04 DIAGNOSIS — Z90710 Acquired absence of both cervix and uterus: Secondary | ICD-10-CM | POA: Insufficient documentation

## 2020-03-04 DIAGNOSIS — K589 Irritable bowel syndrome without diarrhea: Secondary | ICD-10-CM | POA: Insufficient documentation

## 2020-03-04 DIAGNOSIS — M419 Scoliosis, unspecified: Secondary | ICD-10-CM | POA: Diagnosis not present

## 2020-03-04 LAB — CBC WITH DIFFERENTIAL (CANCER CENTER ONLY)
Abs Immature Granulocytes: 0.58 10*3/uL — ABNORMAL HIGH (ref 0.00–0.07)
Basophils Absolute: 0.1 10*3/uL (ref 0.0–0.1)
Basophils Relative: 1 %
Eosinophils Absolute: 0.1 10*3/uL (ref 0.0–0.5)
Eosinophils Relative: 1 %
HCT: 37.2 % (ref 36.0–46.0)
Hemoglobin: 11.6 g/dL — ABNORMAL LOW (ref 12.0–15.0)
Immature Granulocytes: 5 %
Lymphocytes Relative: 5 %
Lymphs Abs: 0.6 10*3/uL — ABNORMAL LOW (ref 0.7–4.0)
MCH: 28 pg (ref 26.0–34.0)
MCHC: 31.2 g/dL (ref 30.0–36.0)
MCV: 89.9 fL (ref 80.0–100.0)
Monocytes Absolute: 0.6 10*3/uL (ref 0.1–1.0)
Monocytes Relative: 5 %
Neutro Abs: 10.3 10*3/uL — ABNORMAL HIGH (ref 1.7–7.7)
Neutrophils Relative %: 83 %
Platelet Count: 390 10*3/uL (ref 150–400)
RBC: 4.14 MIL/uL (ref 3.87–5.11)
RDW: 18.4 % — ABNORMAL HIGH (ref 11.5–15.5)
WBC Count: 12.3 10*3/uL — ABNORMAL HIGH (ref 4.0–10.5)
nRBC: 0 % (ref 0.0–0.2)

## 2020-03-10 NOTE — Telephone Encounter (Signed)
Pt has not heard from anyone concerning this referral. Would like to have someone reach out. Her # is [phone]

## 2020-03-10 NOTE — Telephone Encounter (Signed)
Called and spoke with patient to let her know that I called and spoke with Pulmonary Rehab to get any updates. They informed me that they ran it through insurance and no authorization was needed. I was given the number [phone] to give the patient for her to call and get scheduled. Patient expressed understanding. Nothing further needed at this time.

## 2020-03-12 ENCOUNTER — Telehealth (HOSPITAL_COMMUNITY): Payer: Self-pay

## 2020-03-12 NOTE — Telephone Encounter (Signed)
Called patient to see if she was interested in participating in the Pulmonary Rehab Program. Patient stated yes. Patient will come in for orientation on 04/13/20 @ 9AM and will attend the 215PM exercise class. Went over insurance, patient verbalized understanding.   Tourist information centre manager.

## 2020-03-19 DIAGNOSIS — I700 Atherosclerosis of aorta: Secondary | ICD-10-CM | POA: Diagnosis not present

## 2020-03-19 DIAGNOSIS — D473 Essential (hemorrhagic) thrombocythemia: Secondary | ICD-10-CM | POA: Diagnosis not present

## 2020-03-19 DIAGNOSIS — E7800 Pure hypercholesterolemia, unspecified: Secondary | ICD-10-CM | POA: Diagnosis not present

## 2020-03-19 DIAGNOSIS — Z79899 Other long term (current) drug therapy: Secondary | ICD-10-CM | POA: Diagnosis not present

## 2020-03-19 DIAGNOSIS — J479 Bronchiectasis, uncomplicated: Secondary | ICD-10-CM | POA: Diagnosis not present

## 2020-03-19 DIAGNOSIS — Z0001 Encounter for general adult medical examination with abnormal findings: Secondary | ICD-10-CM | POA: Diagnosis not present

## 2020-03-20 DIAGNOSIS — H01004 Unspecified blepharitis left upper eyelid: Secondary | ICD-10-CM | POA: Diagnosis not present

## 2020-03-20 DIAGNOSIS — H01001 Unspecified blepharitis right upper eyelid: Secondary | ICD-10-CM | POA: Diagnosis not present

## 2020-04-08 ENCOUNTER — Other Ambulatory Visit: Payer: Self-pay

## 2020-04-08 ENCOUNTER — Inpatient Hospital Stay: Payer: Medicare Other | Attending: Hematology

## 2020-04-08 DIAGNOSIS — D473 Essential (hemorrhagic) thrombocythemia: Secondary | ICD-10-CM | POA: Diagnosis not present

## 2020-04-08 LAB — COMPREHENSIVE METABOLIC PANEL
ALT: 11 U/L (ref 0–44)
AST: 14 U/L — ABNORMAL LOW (ref 15–41)
Albumin: 4.4 g/dL (ref 3.5–5.0)
Alkaline Phosphatase: 46 U/L (ref 38–126)
Anion gap: 9 (ref 5–15)
BUN: 29 mg/dL — ABNORMAL HIGH (ref 8–23)
CO2: 25 mmol/L (ref 22–32)
Calcium: 8.9 mg/dL (ref 8.9–10.3)
Chloride: 106 mmol/L (ref 98–111)
Creatinine, Ser: 1.14 mg/dL — ABNORMAL HIGH (ref 0.44–1.00)
GFR, Estimated: 47 mL/min — ABNORMAL LOW (ref >60)
Glucose, Bld: 94 mg/dL (ref 70–99)
Potassium: 4.3 mmol/L (ref 3.5–5.1)
Sodium: 140 mmol/L (ref 135–145)
Total Bilirubin: 0.5 mg/dL (ref 0.3–1.2)
Total Protein: 7 g/dL (ref 6.5–8.1)

## 2020-04-08 LAB — CBC WITH DIFFERENTIAL (CANCER CENTER ONLY)
Abs Immature Granulocytes: 0.68 10*3/uL — ABNORMAL HIGH (ref 0.00–0.07)
Basophils Absolute: 0.1 10*3/uL (ref 0.0–0.1)
Basophils Relative: 1 %
Eosinophils Absolute: 0.1 10*3/uL (ref 0.0–0.5)
Eosinophils Relative: 1 %
HCT: 37.2 % (ref 36.0–46.0)
Hemoglobin: 11.7 g/dL — ABNORMAL LOW (ref 12.0–15.0)
Immature Granulocytes: 6 %
Lymphocytes Relative: 5 %
Lymphs Abs: 0.6 10*3/uL — ABNORMAL LOW (ref 0.7–4.0)
MCH: 28.4 pg (ref 26.0–34.0)
MCHC: 31.5 g/dL (ref 30.0–36.0)
MCV: 90.3 fL (ref 80.0–100.0)
Monocytes Absolute: 0.8 10*3/uL (ref 0.1–1.0)
Monocytes Relative: 6 %
Neutro Abs: 9.9 10*3/uL — ABNORMAL HIGH (ref 1.7–7.7)
Neutrophils Relative %: 81 %
Platelet Count: 413 10*3/uL — ABNORMAL HIGH (ref 150–400)
RBC: 4.12 MIL/uL (ref 3.87–5.11)
RDW: 17.7 % — ABNORMAL HIGH (ref 11.5–15.5)
Smear Review: INCREASED
WBC Count: 12.3 10*3/uL — ABNORMAL HIGH (ref 4.0–10.5)
nRBC: 0.2 % (ref 0.0–0.2)

## 2020-04-09 ENCOUNTER — Telehealth (HOSPITAL_COMMUNITY): Payer: Self-pay | Admitting: *Deleted

## 2020-04-12 DIAGNOSIS — L03115 Cellulitis of right lower limb: Secondary | ICD-10-CM | POA: Diagnosis not present

## 2020-04-12 DIAGNOSIS — M25571 Pain in right ankle and joints of right foot: Secondary | ICD-10-CM | POA: Diagnosis not present

## 2020-04-13 ENCOUNTER — Encounter (HOSPITAL_COMMUNITY): Payer: Self-pay

## 2020-04-13 ENCOUNTER — Other Ambulatory Visit: Payer: Self-pay

## 2020-04-13 ENCOUNTER — Encounter (HOSPITAL_COMMUNITY)
Admission: RE | Admit: 2020-04-13 | Discharge: 2020-04-13 | Disposition: A | Discharge status: Home / Self Care | Payer: Medicare Other | Source: Ambulatory Visit | Attending: Pulmonary Disease | Admitting: Pulmonary Disease

## 2020-04-13 VITALS — BP 102/50 | HR 80 | Ht 59.75 in | Wt 114.9 lb

## 2020-04-13 DIAGNOSIS — J479 Bronchiectasis, uncomplicated: Secondary | ICD-10-CM

## 2020-04-13 NOTE — Progress Notes (Signed)
Chelsea Garcia 87 y.o. female Pulmonary Rehab Orientation Note This patient who was referred to Pulmonary rehab by Dr. Loanne Drilling with the diagnosis of Bronchiectasis without complication arrived today in Cardiac and Pulmonary Rehab. She arrived ambulatory with steady gait. She does not carry portable oxygen. Per pt, she uses oxygen never. Color good, skin warm and dry. Patient is oriented to time and place. Patient's medical history, psychosocial health, and medications reviewed. Psychosocial assessment reveals pt lives with husband and their son. Pt is currently retired. Pt reports her stress level is low. Pt states they live with their son and that has decreased her stress significantly. Areas of stress/anxiety include Health.  Pt does not exhibit signs of depression. PHQ2/9 score 0/8. Pt shows good coping skills with positive outlook . Jeani Hawking was offered emotional support and reassurance. Will continue to monitor and evaluate progress toward psychosocial goal(s) of improving quality of life and increasing ability to do activities of daily living. Patient reports she does take medications as prescribed. Patient states she follows a Regular diet. The patient reports she had been trying to gain weight, due to being underweight. She states she is now at a healthy weight and wants to maintain her weight. Patient's weight will be monitored closely. Demonstration and practice of PLB using pulse oximeter. Patient able to return demonstration satisfactorily. Safety and hand hygiene in the exercise area reviewed with patient. Patient voices understanding of the information reviewed. Department expectations discussed with patient and achievable goals were set. The patient shows enthusiasm about attending the program and we look forward to working with this nice lady. The patient completed a 6 min walk test today and is scheduled to begin exercise on 04/21/20.  45 minutes was spent on a variety of activities such as assessment of  the patient, obtaining baseline data including height, weight, BMI, and grip strength, verifying medical history, allergies, and current medications, and teaching patient strategies for performing tasks with less respiratory effort with emphasis on pursed lip breathing.  Rick Duff MS, ACSM CEP

## 2020-04-13 NOTE — Progress Notes (Signed)
Chelsea Garcia 87 y.o. female Pulmonary Rehab Physical Assessment Note  Physical assessment reveals heart rate is normal, breath sounds clear to auscultation, no wheezes, rales, or rhonchi. Non productive cough.  Bowel sounds present in all four quadrants no complaints of any GI issues other than slight change in bowels since starting on antibiotic. Grip strength equal, strong. Distal pulses palpable with swelling to the right ankle.  Pt has small intact wound area that itches and she was started on antibiotics by her provider.  Chelsea Garcia is excited to begin pulmonary rehab. Cherre Huger, BSN Cardiac and Training and development officer

## 2020-04-13 NOTE — Progress Notes (Signed)
Pulmonary Individual Treatment Plan  Patient Details  Name: Chelsea Garcia MRN: [id number] Date of Birth: 03-Jan-1934 Referring Provider:   April Manson Pulmonary Rehab Walk Test from 04/13/2020 in [place]  Referring Provider Dr. Loanne Drilling      Initial Encounter Date:  Flowsheet Row Pulmonary Rehab Walk Test from 04/13/2020 in [facility]  Date 04/13/20      Visit Diagnosis: Bronchiectasis without complication ([place])  Patient's Home Medications on Admission:   Current Outpatient Medications:  .  acetaminophen (TYLENOL) 500 MG tablet, Take 500 mg by mouth every 6 (six) hours as needed., Disp: , Rfl:  .  brimonidine (ALPHAGAN) 0.15 % ophthalmic solution, 1 drop 2 (two) times daily., Disp: , Rfl:  .  cephALEXin (KEFLEX) 500 MG capsule, Take 500 mg by mouth 2 (two) times daily. 10 day supply, Disp: , Rfl:  .  Fluticasone-Salmeterol (ADVAIR DISKUS) 100-50 MCG/DOSE AEPB, Inhale 1 puff into the lungs in the morning and at bedtime., Disp: 60 each, Rfl: 12 .  Latanoprostene Bunod (VYZULTA) 0.024 % SOLN, Apply to eye once., Disp: , Rfl:  .  Netarsudil Dimesylate 0.02 % SOLN, INT 1 GTT INTO OU QD, Disp: , Rfl:  .  dorzolamidel-timolol (COSOPT) 22.3-6.8 MG/ML SOLN ophthalmic solution, , Disp: , Rfl:  .  hydroxyurea (HYDREA) 500 MG capsule, TAKE ONE CAPSULE BY MOUTH DAILY WITH FOOD TO MINIMIZE GI SIDE EFFECTS (Patient not taking: Reported on 04/13/2020), Disp: 90 capsule, Rfl: 2  Past Medical History: Past Medical History:  Diagnosis Date  . Colon polyps    Benign. 2 colonoscopies in past. Due now again.  . Complication of anesthesia    "got dizzy in 70's"  . Endometriosis    required TAH when young  . Essential tremor    since 40's  . GERD (gastroesophageal reflux disease)    denis  . Glaucoma    started in 40's  . Low back pain    since 40's  . Migraine    started in 50's  . Osteopenia    2014  . Seasonal allergies    to  Dust, mold and pollen.  . Thrombocytosis September 2015  . Tick bites    she likes out door activities, 2 this year (2015)    Tobacco Use: Social History   Tobacco Use  Smoking Status Former Smoker  . Packs/day: 1.00  . Years: 26.00  . Pack years: 26.00  . Types: Cigarettes  . Quit date: 07/01/1961  . Years since quitting: 58.8  Smokeless Tobacco Never Used    Labs: Recent Review Flowsheet Data   There is no flowsheet data to display.     Capillary Blood Glucose: No results found for: GLUCAP   Pulmonary Assessment Scores:  Pulmonary Assessment Scores    Row Name 04/13/20 1401         ADL UCSD   ADL Phase Entry     SOB Score total 23           CAT Score   CAT Score 14           mMRC Score   mMRC Score 1           UCSD: Self-administered rating of dyspnea associated with activities of daily living (ADLs) 6-point scale (0 = "not at all" to 5 = "maximal or unable to do because of breathlessness")  Scoring Scores range from 0 to 120.  Minimally important difference is 5 units  CAT:  CAT can identify the health impairment of COPD patients and is better correlated with disease progression.  CAT has a scoring range of zero to 40. The CAT score is classified into four groups of low (less than 10), medium (10 - 20), high (21-30) and very high (31-40) based on the impact level of disease on health status. A CAT score over 10 suggests significant symptoms.  A worsening CAT score could be explained by an exacerbation, poor medication adherence, poor inhaler technique, or progression of COPD or comorbid conditions.  CAT MCID is 2 points  mMRC: mMRC (Modified Medical Research Council) Dyspnea Scale is used to assess the degree of baseline functional disability in patients of respiratory disease due to dyspnea. No minimal important difference is established. A decrease in score of 1 point or greater is considered a positive change.   Pulmonary Function Assessment:   Pulmonary Function Assessment - 04/13/20 0931      Breath   Shortness of Breath Yes;Limiting activity           Exercise Target Goals: Exercise Program Goal: Individual exercise prescription set using results from initial 6 min walk test and THRR while considering  patient's activity barriers and safety.   Exercise Prescription Goal: Initial exercise prescription builds to 30-45 minutes a day of aerobic activity, 2-3 days per week.  Home exercise guidelines will be given to patient during program as part of exercise prescription that the participant will acknowledge.  Activity Barriers & Risk Stratification:  Activity Barriers & Cardiac Risk Stratification - 04/13/20 0929      Activity Barriers & Cardiac Risk Stratification   Activity Barriers Deconditioning;Muscular Weakness;Shortness of Breath   Spinal Scoliosis          6 Minute Walk:  6 Minute Walk    Row Name 04/13/20 1353         6 Minute Walk   Phase Initial     Distance 1075 feet     Walk Time 6 minutes     # of Rest Breaks 0     MPH 2.03     METS 1.76     RPE 13     Perceived Dyspnea  1     VO2 Peak 6.17     Symptoms No     Resting HR 80 bpm     Resting BP 102/50     Resting Oxygen Saturation  96 %     Exercise Oxygen Saturation  during 6 min walk 94 %     Max Ex. HR 101 bpm     Max Ex. BP 122/58     2 Minute Post BP 104/52           Interval HR   1 Minute HR 87     2 Minute HR 94     3 Minute HR 101     4 Minute HR 92     5 Minute HR 95     6 Minute HR 94     2 Minute Post HR 83     Interval Heart Rate? Yes           Interval Oxygen   Interval Oxygen? Yes     Baseline Oxygen Saturation % 96 %     1 Minute Oxygen Saturation % 95 %     1 Minute Liters of Oxygen 0 L     2 Minute Oxygen Saturation % 94 %     2 Minute Liters of Oxygen 0  L     3 Minute Oxygen Saturation % 94 %     3 Minute Liters of Oxygen 0 L     4 Minute Oxygen Saturation % 94 %     4 Minute Liters of Oxygen 0 L     5  Minute Oxygen Saturation % 94 %     5 Minute Liters of Oxygen 0 L     6 Minute Oxygen Saturation % 94 %     6 Minute Liters of Oxygen 0 L     2 Minute Post Oxygen Saturation % 96 %     2 Minute Post Liters of Oxygen 0 L            Oxygen Initial Assessment:  Oxygen Initial Assessment - 04/13/20 0931      Home Oxygen   Home Oxygen Device None    Sleep Oxygen Prescription None    Home Exercise Oxygen Prescription None    Home Resting Oxygen Prescription None      Initial 6 min Walk   Oxygen Used None      Program Oxygen Prescription   Program Oxygen Prescription None      Intervention   Short Term Goals To learn and understand importance of monitoring SPO2 with pulse oximeter and demonstrate accurate use of the pulse oximeter.;To learn and understand importance of maintaining oxygen saturations>88%;To learn and demonstrate proper pursed lip breathing techniques or other breathing techniques.;To learn and demonstrate proper use of respiratory medications    Long  Term Goals Verbalizes importance of monitoring SPO2 with pulse oximeter and return demonstration;Maintenance of O2 saturations>88%;Exhibits proper breathing techniques, such as pursed lip breathing or other method taught during program session;Compliance with respiratory medication;Demonstrates proper use of MDI's           Oxygen Re-Evaluation:   Oxygen Discharge (Final Oxygen Re-Evaluation):   Initial Exercise Prescription:  Initial Exercise Prescription - 04/13/20 1400      Date of Initial Exercise RX and Referring Provider   Date 04/13/20    Referring Provider Dr. Loanne Drilling    Expected Discharge Date 06/18/20      NuStep   Level 1    SPM 75    Minutes 15      Track   Minutes 15      Prescription Details   Frequency (times per week) 2    Duration Progress to 30 minutes of continuous aerobic without signs/symptoms of physical distress      Intensity   THRR 40-80% of Max Heartrate 54-109    Ratings  of Perceived Exertion 11-13    Perceived Dyspnea 0-4      Progression   Progression Continue to progress workloads to maintain intensity without signs/symptoms of physical distress.      Resistance Training   Training Prescription Yes    Weight orange bands    Reps 10-15           Perform Capillary Blood Glucose checks as needed.  Exercise Prescription Changes:   Exercise Comments:   Exercise Goals and Review:  Exercise Goals    Row Name 04/13/20 1036             Exercise Goals   Increase Physical Activity Yes       Intervention Provide advice, education, support and counseling about physical activity/exercise needs.;Develop an individualized exercise prescription for aerobic and resistive training based on initial evaluation findings, risk stratification, comorbidities and participant's personal goals.  Expected Outcomes Short Term: Attend rehab on a regular basis to increase amount of physical activity.;Long Term: Add in home exercise to make exercise part of routine and to increase amount of physical activity.;Long Term: Exercising regularly at least 3-5 days a week.       Increase Strength and Stamina Yes       Intervention Provide advice, education, support and counseling about physical activity/exercise needs.;Develop an individualized exercise prescription for aerobic and resistive training based on initial evaluation findings, risk stratification, comorbidities and participant's personal goals.       Expected Outcomes Short Term: Increase workloads from initial exercise prescription for resistance, speed, and METs.;Short Term: Perform resistance training exercises routinely during rehab and add in resistance training at home;Long Term: Improve cardiorespiratory fitness, muscular endurance and strength as measured by increased METs and functional capacity (6MWT)       Able to understand and use rate of perceived exertion (RPE) scale Yes       Intervention Provide  education and explanation on how to use RPE scale       Expected Outcomes Short Term: Able to use RPE daily in rehab to express subjective intensity level;Long Term:  Able to use RPE to guide intensity level when exercising independently       Able to understand and use Dyspnea scale Yes       Intervention Provide education and explanation on how to use Dyspnea scale       Expected Outcomes Short Term: Able to use Dyspnea scale daily in rehab to express subjective sense of shortness of breath during exertion;Long Term: Able to use Dyspnea scale to guide intensity level when exercising independently       Knowledge and understanding of Target Heart Rate Range (THRR) Yes       Intervention Provide education and explanation of THRR including how the numbers were predicted and where they are located for reference       Expected Outcomes Short Term: Able to state/look up THRR;Long Term: Able to use THRR to govern intensity when exercising independently;Short Term: Able to use daily as guideline for intensity in rehab       Understanding of Exercise Prescription Yes       Intervention Provide education, explanation, and written materials on patient's individual exercise prescription       Expected Outcomes Short Term: Able to explain program exercise prescription;Long Term: Able to explain home exercise prescription to exercise independently              Exercise Goals Re-Evaluation :   Discharge Exercise Prescription (Final Exercise Prescription Changes):   Nutrition:  Target Goals: Understanding of nutrition guidelines, daily intake of sodium 1500mg , cholesterol 200mg , calories 30% from fat and 7% or less from saturated fats, daily to have 5 or more servings of fruits and vegetables.  Biometrics:  Pre Biometrics - 04/13/20 1348      Pre Biometrics   Grip Strength 19 kg            Nutrition Therapy Plan and Nutrition Goals:   Nutrition Assessments:  MEDIFICTS Score Key:  ?70  Need to make dietary changes   40-70 Heart Healthy Diet  ? 40 Therapeutic Level Cholesterol Diet   Picture Your Plate Scores:  <43 Unhealthy dietary pattern with much room for improvement.  41-50 Dietary pattern unlikely to meet recommendations for good health and room for improvement.  51-60 More healthful dietary pattern, with some room for improvement.   >60 Healthy  dietary pattern, although there may be some specific behaviors that could be improved.    Nutrition Goals Re-Evaluation:   Nutrition Goals Discharge (Final Nutrition Goals Re-Evaluation):   Psychosocial: Target Goals: Acknowledge presence or absence of significant depression and/or stress, maximize coping skills, provide positive support system. Participant is able to verbalize types and ability to use techniques and skills needed for reducing stress and depression.  Initial Review & Psychosocial Screening:  Initial Psych Review & Screening - 04/13/20 1349      Screening Interventions   Expected Outcomes Long Term Goal: Stressors or current issues are controlled or eliminated.;Long Term goal: The participant improves quality of Life and PHQ9 Scores as seen by post scores and/or verbalization of changes           Quality of Life Scores:  Scores of 19 and below usually indicate a poorer quality of life in these areas.  A difference of  2-3 points is a clinically meaningful difference.  A difference of 2-3 points in the total score of the Quality of Life Index has been associated with significant improvement in overall quality of life, self-image, physical symptoms, and general health in studies assessing change in quality of life.  PHQ-9: Recent Review Flowsheet Data    Depression screen  [facility] 2/9 04/13/2020   Decreased Interest 0   Down, Depressed, Hopeless 0   PHQ - 2 Score 0   Altered sleeping 3   Tired, decreased energy 3   Change in appetite 0   Feeling bad or failure about yourself  0   Trouble  concentrating 2   Suicidal thoughts 0   Difficult doing work/chores Not difficult at all     Interpretation of Total Score  Total Score Depression Severity:  1-4 = Minimal depression, 5-9 = Mild depression, 10-14 = Moderate depression, 15-19 = Moderately severe depression, 20-27 = Severe depression   Psychosocial Evaluation and Intervention:  Psychosocial Evaluation - 04/13/20 1031      Psychosocial Evaluation & Interventions   Interventions Encouraged to exercise with the program and follow exercise prescription    Comments Pt and husband live with their son, which has decreased a lot of stress and made home management a lot easier. Pt denies any history or current depression. Pt seems to be in a good state of mind.    Expected Outcomes Improve quality of life and decrease shortness of breath with daily activities.    Continue Psychosocial Services  Follow up required by staff           Psychosocial Re-Evaluation:   Psychosocial Discharge (Final Psychosocial Re-Evaluation):   Education: Education Goals: Education classes will be provided on a weekly basis, covering required topics. Participant will state understanding/return demonstration of topics presented.  Learning Barriers/Preferences:  Learning Barriers/Preferences - 04/13/20 0934      Learning Barriers/Preferences   Learning Barriers Sight   Has glaucoma   Learning Preferences Skilled Demonstration;Written Material;Individual Instruction           Education Topics: Risk Factor Reduction:  -Group instruction that is supported by a PowerPoint presentation. Instructor discusses the definition of a risk factor, different risk factors for pulmonary disease, and how the heart and lungs work together.     Nutrition for Pulmonary Patient:  -Group instruction provided by PowerPoint slides, verbal discussion, and written materials to support subject matter. The instructor gives an explanation and review of healthy diet  recommendations, which includes a discussion on weight management, recommendations for fruit and vegetable consumption,  as well as protein, fluid, caffeine, fiber, sodium, sugar, and alcohol. Tips for eating when patients are short of breath are discussed.   Pursed Lip Breathing:  -Group instruction that is supported by demonstration and informational handouts. Instructor discusses the benefits of pursed lip and diaphragmatic breathing and detailed demonstration on how to preform both.     Oxygen Safety:  -Group instruction provided by PowerPoint, verbal discussion, and written material to support subject matter. There is an overview of "What is Oxygen" and "Why do we need it".  Instructor also reviews how to create a safe environment for oxygen use, the importance of using oxygen as prescribed, and the risks of noncompliance. There is a brief discussion on traveling with oxygen and resources the patient may utilize.   Oxygen Equipment:  -Group instruction provided by [facility] Staff utilizing handouts, written materials, and equipment demonstrations.   Signs and Symptoms:  -Group instruction provided by written material and verbal discussion to support subject matter. Warning signs and symptoms of infection, stroke, and heart attack are reviewed and when to call the physician/911 reinforced. Tips for preventing the spread of infection discussed.   Advanced Directives:  -Group instruction provided by verbal instruction and written material to support subject matter. Instructor reviews Advanced Directive laws and proper instruction for filling out document.   Pulmonary Video:  -Group video education that reviews the importance of medication and oxygen compliance, exercise, good nutrition, pulmonary hygiene, and pursed lip and diaphragmatic breathing for the pulmonary patient.   Exercise for the Pulmonary Patient:  -Group instruction that is supported by a PowerPoint presentation.  Instructor discusses benefits of exercise, core components of exercise, frequency, duration, and intensity of an exercise routine, importance of utilizing pulse oximetry during exercise, safety while exercising, and options of places to exercise outside of rehab.     Pulmonary Medications:  -Verbally interactive group education provided by instructor with focus on inhaled medications and proper administration.   Anatomy and Physiology of the Respiratory System and Intimacy:  -Group instruction provided by PowerPoint, verbal discussion, and written material to support subject matter. Instructor reviews respiratory cycle and anatomical components of the respiratory system and their functions. Instructor also reviews differences in obstructive and restrictive respiratory diseases with examples of each. Intimacy, Sex, and Sexuality differences are reviewed with a discussion on how relationships can change when diagnosed with pulmonary disease. Common sexual concerns are reviewed.   MD DAY -A group question and answer session with a medical doctor that allows participants to ask questions that relate to their pulmonary disease state.   OTHER EDUCATION -Group or individual verbal, written, or video instructions that support the educational goals of the pulmonary rehab program.   Holiday Eating Survival Tips:  -Group instruction provided by PowerPoint slides, verbal discussion, and written materials to support subject matter. The instructor gives patients tips, tricks, and techniques to help them not only survive but enjoy the holidays despite the onslaught of food that accompanies the holidays.   Knowledge Questionnaire Score:   Core Components/Risk Factors/Patient Goals at Admission:  Personal Goals and Risk Factors at Admission - 04/13/20 1349      Core Components/Risk Factors/Patient Goals on Admission    Weight Management Weight Maintenance           Core Components/Risk  Factors/Patient Goals Review:    Core Components/Risk Factors/Patient Goals at Discharge (Final Review):    ITP Comments:   Comments:

## 2020-04-21 ENCOUNTER — Other Ambulatory Visit: Payer: Self-pay

## 2020-04-21 ENCOUNTER — Encounter (HOSPITAL_COMMUNITY)
Admission: RE | Admit: 2020-04-21 | Discharge: 2020-04-21 | Disposition: A | Discharge status: Home / Self Care | Payer: Medicare Other | Source: Ambulatory Visit | Attending: Pulmonary Disease | Admitting: Pulmonary Disease

## 2020-04-21 DIAGNOSIS — J479 Bronchiectasis, uncomplicated: Secondary | ICD-10-CM

## 2020-04-21 NOTE — Progress Notes (Signed)
Daily Session Note  Patient Details  Name: Chelsea Garcia MRN: [id number] Date of Birth: March 27, 1933 Referring Provider:   April Manson Pulmonary Rehab Walk Test from 04/13/2020 in [place]  Referring Provider Dr. Loanne Drilling      Encounter Date: 04/21/2020  Check In:  Session Check In - 04/21/20 1438      Check-In   Supervising physician immediately available to respond to emergencies [facility] immediately available    Physician(s) Dr. Tawanna Solo    Location [facility]    Staff Present Rosebud Poles, RN, Milus Glazier, MS, EP-C, CCRP;Jessica Hassell Done, MS, ACSM-CEP, Exercise Physiologist    Virtual Visit No    Medication changes reported     No    Fall or balance concerns reported    No    Tobacco Cessation No Change    Warm-up and Cool-down Performed on first and last piece of equipment    Resistance Training Performed Yes    VAD Patient? No    PAD/SET Patient? No      Pain Assessment   Currently in Pain? No/denies    Multiple Pain Sites No           Capillary Blood Glucose: No results found for this or any previous visit (from the past 24 hour(s)).    Social History   Tobacco Use  Smoking Status Former Smoker  . Packs/day: 1.00  . Years: 26.00  . Pack years: 26.00  . Types: Cigarettes  . Quit date: 07/01/1961  . Years since quitting: 58.8  Smokeless Tobacco Never Used    Goals Met:  Proper associated with RPD/PD & O2 Sat Exercise tolerated well Strength training completed today  Goals Unmet:  Not Applicable  Comments: Service time is from 1410 to [place]    Dr. Fransico Him is Medical Director for Cardiac Rehab at [facility].

## 2020-04-23 ENCOUNTER — Other Ambulatory Visit: Payer: Self-pay

## 2020-04-23 ENCOUNTER — Encounter (HOSPITAL_COMMUNITY)
Admission: RE | Admit: 2020-04-23 | Discharge: 2020-04-23 | Disposition: A | Discharge status: Home / Self Care | Payer: Medicare Other | Source: Ambulatory Visit | Attending: Pulmonary Disease | Admitting: Pulmonary Disease

## 2020-04-23 DIAGNOSIS — J479 Bronchiectasis, uncomplicated: Secondary | ICD-10-CM | POA: Diagnosis not present

## 2020-04-23 NOTE — Progress Notes (Signed)
Daily Session Note  Patient Details  Name: Chelsea Garcia MRN: [id number] Date of Birth: February 07, 1933 Referring Provider:   April Manson Pulmonary Rehab Walk Test from 04/13/2020 in [place]  Referring Provider Dr. Loanne Drilling      Encounter Date: 04/23/2020  Check In:  Session Check In - 04/23/20 1441      Check-In   Supervising physician immediately available to respond to emergencies [facility] immediately available    Physician(s) Dr. Florencia Reasons    Location [facility]    Staff Present Rosebud Poles, RN, BSN;[name] Ysidro Evert, RN;Jessica Hassell Done, MS, ACSM-CEP, Exercise Physiologist    Virtual Visit No    Medication changes reported     No    Fall or balance concerns reported    No    Tobacco Cessation No Change    Warm-up and Cool-down Performed on first and last piece of equipment    Resistance Training Performed Yes    VAD Patient? No    PAD/SET Patient? No      Pain Assessment   Currently in Pain? No/denies    Multiple Pain Sites No           Capillary Blood Glucose: No results found for this or any previous visit (from the past 24 hour(s)).    Social History   Tobacco Use  Smoking Status Former Smoker  . Packs/day: 1.00  . Years: 26.00  . Pack years: 26.00  . Types: Cigarettes  . Quit date: 07/01/1961  . Years since quitting: 58.8  Smokeless Tobacco Never Used    Goals Met:  Proper associated with RPD/PD & O2 Sat Exercise tolerated well Strength training completed today  Goals Unmet:  Not Applicable  Comments: Service time is from 1414 to [place]    Dr. Fransico Him is Medical Director for Cardiac Rehab at [facility].

## 2020-04-28 ENCOUNTER — Other Ambulatory Visit: Payer: Self-pay

## 2020-04-28 ENCOUNTER — Encounter (HOSPITAL_COMMUNITY)
Admission: RE | Admit: 2020-04-28 | Discharge: 2020-04-28 | Disposition: A | Discharge status: Home / Self Care | Payer: Medicare Other | Source: Ambulatory Visit | Attending: Pulmonary Disease | Admitting: Pulmonary Disease

## 2020-04-28 VITALS — Wt 115.5 lb

## 2020-04-28 DIAGNOSIS — J479 Bronchiectasis, uncomplicated: Secondary | ICD-10-CM

## 2020-04-28 NOTE — Progress Notes (Signed)
Daily Session Note  Patient Details  Name: Chelsea Garcia MRN: [id number] Date of Birth: 1933-11-09 Referring Provider:   April Manson Pulmonary Rehab Walk Test from 04/13/2020 in [place]  Referring Provider Dr. Loanne Drilling      Encounter Date: 04/28/2020  Check In:  Session Check In - 04/28/20 1453      Check-In   Supervising physician immediately available to respond to emergencies [facility] immediately available    Physician(s) Dr. Tawanna Solo    Location [facility]    Staff Present Rosebud Poles, RN, BSN;[name] Ysidro Evert, RN;Jessica Hassell Done, MS, ACSM-CEP, Exercise Physiologist    Virtual Visit No    Medication changes reported     No    Fall or balance concerns reported    No    Tobacco Cessation No Change    Warm-up and Cool-down Performed on first and last piece of equipment    Resistance Training Performed Yes    VAD Patient? No    PAD/SET Patient? No      Pain Assessment   Currently in Pain? No/denies    Multiple Pain Sites No           Capillary Blood Glucose: No results found for this or any previous visit (from the past 24 hour(s)).   Exercise Prescription Changes - 04/28/20 1500      Response to Exercise   Blood Pressure (Admit) 132/84    Blood Pressure (Exercise) 114/60    Blood Pressure (Exit) 126/60    Heart Rate (Admit) 69 bpm    Heart Rate (Exercise) 81 bpm    Heart Rate (Exit) 2.1 bpm    Oxygen Saturation (Admit) 98 %    Oxygen Saturation (Exercise) 97 %    Oxygen Saturation (Exit) 96 %    Rating of Perceived Exertion (Exercise) 11    Perceived Dyspnea (Exercise) 1    Duration Continue with 30 min of aerobic exercise without signs/symptoms of physical distress.    Intensity --   40-80% HRR     Progression   Progression Continue to progress workloads to maintain intensity without signs/symptoms of physical distress.      Resistance Training   Training Prescription Yes    Weight orange bands     Reps 10-15    Time 10 Minutes      NuStep   Level 1    SPM 80    Minutes 15    METs 1.3      Track   Laps 15    Minutes 15           Social History   Tobacco Use  Smoking Status Former Smoker  . Packs/day: 1.00  . Years: 26.00  . Pack years: 26.00  . Types: Cigarettes  . Quit date: 07/01/1961  . Years since quitting: 58.8  Smokeless Tobacco Never Used    Goals Met:  Proper associated with RPD/PD & O2 Sat Exercise tolerated well Strength training completed today  Goals Unmet:  Not Applicable  Comments: Service time is from 1351 to 1500.    Dr. Fransico Him is Medical Director for Cardiac Rehab at [facility]

## 2020-04-30 ENCOUNTER — Encounter (HOSPITAL_COMMUNITY)
Admission: RE | Admit: 2020-04-30 | Discharge: 2020-04-30 | Disposition: A | Discharge status: Home / Self Care | Payer: Medicare Other | Source: Ambulatory Visit | Attending: Pulmonary Disease | Admitting: Pulmonary Disease

## 2020-05-01 NOTE — Progress Notes (Signed)
[place]   [phone] [phone]   Clinic Follow up Note   Patient Care Team: Lujean Amel, MD as PCP - General (Family Medicine) Bernadene Bell, MD (Inactive) as Consulting Physician (Hematology)  Date of Service:  05/06/2020  CHIEF COMPLAINT: F/u ofEssential Thrombocytosis  SUMMARY OF ONCOLOGIC HISTORY: Oncology History  Essential thrombocytosis ([place])  10/01/2013 Initial Diagnosis   Essential thrombocytosis     CURRENT THERAPY:  -Hydroxyurea 500mg  daily started 11/27/2013. Decreased to 500mg  M-F in 12/2017, further decrease to 500mg  MWF on 07/06/2018. Reduced to 500mg  on2 days a weekstarting 12/25/18.Reduced to 500mg  once weekly starting 03/18/19.Held since 02/05/20 due to well controlled ET.  -Aspirin 81mg  daily  INTERVAL HISTORY:  Chelsea Garcia is here for a follow up of ET. She was last seen by me 3 months ago. She presents to the clinic alone. She notes being off Hydrea she does not feel much difference. I reviewed her medication list with her. She notes she has an infection of her foot in March which resolved on antibiotics.     REVIEW OF SYSTEMS:   Constitutional: Denies fevers, chills or abnormal weight loss Eyes: Denies blurriness of vision Ears, nose, mouth, throat, and face: Denies mucositis or sore throat Respiratory: Denies cough, dyspnea or wheezes Cardiovascular: Denies palpitation, chest discomfort or lower extremity swelling Gastrointestinal:  Denies nausea, heartburn or change in bowel habits Skin: Denies abnormal skin rashes Lymphatics: Denies new lymphadenopathy or easy bruising Neurological:Denies numbness, tingling or new weaknesses Behavioral/Psych: Mood is stable, no new changes  All other systems were reviewed with the patient and are negative.  MEDICAL HISTORY:  Past Medical History:  Diagnosis Date  . Colon polyps    Benign. 2 colonoscopies in past. Due now again.  . Complication of anesthesia    "got  dizzy in 70's"  . Endometriosis    required TAH when young  . Essential tremor    since 40's  . GERD (gastroesophageal reflux disease)    denis  . Glaucoma    started in 40's  . Low back pain    since 40's  . Migraine    started in 50's  . Osteopenia    2014  . Seasonal allergies    to Dust, mold and pollen.  . Thrombocytosis September 2015  . Tick bites    she likes out door activities, 2 this year (2015)    SURGICAL HISTORY: Past Surgical History:  Procedure Laterality Date  . CATARACT EXTRACTION Left 02/07/2013  . INGUINAL HERNIA REPAIR Left 01/09/2020   Procedure: LEFT INGUINAL HERNIA REPAIR WITH MESH;  Surgeon: Erroll Luna, MD;  Location: [place];  Service: General;  Laterality: Left;  . INSERTION OF MESH Left 01/09/2020   Procedure: INSERTION OF MESH;  Surgeon: Erroll Luna, MD;  Location: [place];  Service: General;  Laterality: Left;  . TOTAL ABDOMINAL HYSTERECTOMY  1970's  . TUBAL LIGATION      I have reviewed the social history and family history with the patient and they are unchanged from previous note.  ALLERGIES:  is allergic to nystatin, bee pollen, pollen extract, albuterol, mite (d. farinae), and sulfamethoxazole.  MEDICATIONS:  Current Outpatient Medications  Medication Sig Dispense Refill  . acetaminophen (TYLENOL) 500 MG tablet Take 500 mg by mouth every 6 (six) hours as needed.    . brimonidine (ALPHAGAN) 0.15 % ophthalmic solution 1 drop 2 (two) times daily.    . dorzolamidel-timolol (COSOPT) 22.3-6.8 MG/ML SOLN ophthalmic solution  (  Patient not taking: Reported on 04/13/2020)    . Fluticasone-Salmeterol (ADVAIR DISKUS) 100-50 MCG/DOSE AEPB Inhale 1 puff into the lungs in the morning and at bedtime. 60 each 12  . Latanoprostene Bunod (VYZULTA) 0.024 % SOLN Apply to eye once.    . Netarsudil Dimesylate 0.02 % SOLN INT 1 GTT INTO OU QD     No current facility-administered medications for this visit.     PHYSICAL EXAMINATION: ECOG PERFORMANCE STATUS: 1 - Symptomatic but completely ambulatory  Vitals:   05/06/20 1038  BP: 123/63  Pulse: 73  Resp: 18  Temp: (!) 97 F (36.1 C)  SpO2: 96%   Filed Weights   05/06/20 1038  Weight: 115 lb 1.6 oz (52.2 kg)    Due to COVID19 we will limit examination to appearance. Patient had no complaints.  GENERAL:alert, no distress and comfortable SKIN: skin color normal, no rashes or significant lesions EYES: normal, Conjunctiva are pink and non-injected, sclera clear  NEURO: alert & oriented x 3 with fluent speech   LABORATORY DATA:  I have reviewed the data as listed CBC Latest Ref Rng & Units 05/06/2020 04/08/2020 03/04/2020  WBC 4.0 - 10.5 K/uL 12.5(H) 12.3(H) 12.3(H)  Hemoglobin 12.0 - 15.0 g/dL 12.0 11.7(L) 11.6(L)  Hematocrit 36.0 - 46.0 % 37.8 37.2 37.2  Platelets 150 - 400 K/uL 416(H) 413(H) 390     CMP Latest Ref Rng & Units 04/08/2020 01/06/2020 10/17/2019  Glucose 70 - 99 mg/dL 94 104(H) 103(H)  BUN 8 - 23 mg/dL 29(H) 28(H) 30(H)  Creatinine 0.44 - 1.00 mg/dL 1.14(H) 1.02(H) 1.01(H)  Sodium 135 - 145 mmol/L 140 138 139  Potassium 3.5 - 5.1 mmol/L 4.3 4.5 4.3  Chloride 98 - 111 mmol/L 106 107 107  CO2 22 - 32 mmol/L 25 25 28   Calcium 8.9 - 10.3 mg/dL 8.9 9.4 9.4  Total Protein 6.5 - 8.1 g/dL 7.0 6.7 6.8  Total Bilirubin 0.3 - 1.2 mg/dL 0.5 0.8 0.5  Alkaline Phos 38 - 126 U/L 46 42 47  AST 15 - 41 U/L 14(L) 18 15  ALT 0 - 44 U/L 11 15 11       RADIOGRAPHIC STUDIES: I have personally reviewed the radiological images as listed and agreed with the findings in the report. No results found.   ASSESSMENT & PLAN:  Chelsea Garcia is a 87 y.o. female with    1. Essential thrombocytosis, JAK2(+) -Diagnosed in 11/2015with plt initially in 600 range.Due to her advanced age, she is at high risk for thrombosis secondary to ET.We previously discussed the goal of therapy is palliative, and prevent thrombosis. Unfortunately it is not  curable, and there is a small risk in the involved to acute leukemia and myelofibrosis down the road. -Ipreviouslydiscussedthe possibility ofher ETevolvingtomyelofibrosis leukemia, so far her WBC has been normal, if the concerns arises in future, I mayrecommend repeatingbone marrow biopsy to further evaluate.  -She has been taking hydreasince 2015, mostly at low dose tolerating well. Given the good control of her ET, we stopped her hydrea since 02/05/20. If her platelets increase over 500K, I would recommend restarting hydrea. She is agreeable. Continue aspirin 81 mg once daily.  -She is clinically doing well and stable. CBC today shows WBC 12.5, plt 416K, ANC 10.5, Lymph ct 0.6. Overall ET remains controlled off Hydrea.  -Will continue to monitor with labs every 3 months. F/u in 6 months.    2.IBS, Acid Reflux, Scoliosis  -Her CT CAP from 03/2017 showeddiverticulosis and moderate splenomegaly. -Her IBS  isoverall mild and manageable on lower dose hydrea. -Stable    PLAN: -Lab in 3 and 6 months  -F/u in 6 months    No problem-specific Assessment & Plan notes found for this encounter.   No orders of the defined types were placed in this encounter.  All questions were answered. The patient knows to call the clinic with any problems, questions or concerns. No barriers to learning was detected. The total time spent in the appointment was 15 minutes.     Truitt Merle, MD 05/06/2020   I, Joslyn Devon, am acting as scribe for Truitt Merle, MD.   I have reviewed the above documentation for accuracy and completeness, and I agree with the above.

## 2020-05-05 ENCOUNTER — Encounter (HOSPITAL_COMMUNITY)
Admission: RE | Admit: 2020-05-05 | Discharge: 2020-05-05 | Disposition: A | Discharge status: Home / Self Care | Payer: Medicare Other | Source: Ambulatory Visit | Attending: Pulmonary Disease | Admitting: Pulmonary Disease

## 2020-05-05 ENCOUNTER — Other Ambulatory Visit: Payer: Self-pay

## 2020-05-05 VITALS — Wt 115.7 lb

## 2020-05-05 DIAGNOSIS — J479 Bronchiectasis, uncomplicated: Secondary | ICD-10-CM | POA: Diagnosis not present

## 2020-05-05 NOTE — Progress Notes (Signed)
Daily Session Note  Patient Details  Name: Chelsea Garcia MRN: [id number] Date of Birth: [birth year] Referring Provider:   April Manson Pulmonary Rehab Walk Test from 04/13/2020 in [place]  Referring Provider Dr. Loanne Drilling      Encounter Date: 05/05/2020  Check In:  Session Check In - 05/05/20 1442      Check-In   Supervising physician immediately available to respond to emergencies [facility] immediately available    Physician(s) Dr. Tawanna Solo    Location [facility]    Staff Present Rosebud Poles, RN, BSN;Britlyn Martine Ysidro Evert, RN;Jessica Hassell Done, MS, ACSM-CEP, Exercise Physiologist    Virtual Visit No    Medication changes reported     No    Fall or balance concerns reported    No    Tobacco Cessation No Change    Warm-up and Cool-down Performed on first and last piece of equipment    Resistance Training Performed Yes    VAD Patient? No    PAD/SET Patient? No      Pain Assessment   Currently in Pain? No/denies    Multiple Pain Sites No           Capillary Blood Glucose: No results found for this or any previous visit (from the past 24 hour(s)).    Social History   Tobacco Use  Smoking Status Former Smoker  . Packs/day: 1.00  . Years: 26.00  . Pack years: 26.00  . Types: Cigarettes  . Quit date: 07/01/1961  . Years since quitting: 58.8  Smokeless Tobacco Never Used    Goals Met:  Exercise tolerated well No report of cardiac concerns or symptoms Strength training completed today  Goals Unmet:  Not Applicable  Comments: Service time is from 1355 to 1505    Dr. Fransico Him is Medical Director for Cardiac Rehab at [facility].

## 2020-05-05 NOTE — Progress Notes (Signed)
Pulmonary Individual Treatment Plan  Patient Details  Name: Chelsea Garcia MRN: [id number] Date of Birth: 08/28/1933 Referring Provider:   April Manson Pulmonary Rehab Walk Test from 04/13/2020 in [place]  Referring Provider Dr. Loanne Drilling      Initial Encounter Date:  Flowsheet Row Pulmonary Rehab Walk Test from 04/13/2020 in [facility]  Date 04/13/20      Visit Diagnosis: Bronchiectasis without complication ([place])  Patient's Home Medications on Admission:   Current Outpatient Medications:  .  acetaminophen (TYLENOL) 500 MG tablet, Take 500 mg by mouth every 6 (six) hours as needed., Disp: , Rfl:  .  brimonidine (ALPHAGAN) 0.15 % ophthalmic solution, 1 drop 2 (two) times daily., Disp: , Rfl:  .  cephALEXin (KEFLEX) 500 MG capsule, Take 500 mg by mouth 2 (two) times daily. 10 day supply, Disp: , Rfl:  .  dorzolamidel-timolol (COSOPT) 22.3-6.8 MG/ML SOLN ophthalmic solution, , Disp: , Rfl:  .  Fluticasone-Salmeterol (ADVAIR DISKUS) 100-50 MCG/DOSE AEPB, Inhale 1 puff into the lungs in the morning and at bedtime., Disp: 60 each, Rfl: 12 .  hydroxyurea (HYDREA) 500 MG capsule, TAKE ONE CAPSULE BY MOUTH DAILY WITH FOOD TO MINIMIZE GI SIDE EFFECTS (Patient not taking: Reported on 04/13/2020), Disp: 90 capsule, Rfl: 2 .  Latanoprostene Bunod (VYZULTA) 0.024 % SOLN, Apply to eye once., Disp: , Rfl:  .  Netarsudil Dimesylate 0.02 % SOLN, INT 1 GTT INTO OU QD, Disp: , Rfl:   Past Medical History: Past Medical History:  Diagnosis Date  . Colon polyps    Benign. 2 colonoscopies in past. Due now again.  . Complication of anesthesia    "got dizzy in 70's"  . Endometriosis    required TAH when young  . Essential tremor    since 40's  . GERD (gastroesophageal reflux disease)    denis  . Glaucoma    started in 40's  . Low back pain    since 40's  . Migraine    started in 50's  . Osteopenia    2014  . Seasonal allergies    to  Dust, mold and pollen.  . Thrombocytosis September 2015  . Tick bites    she likes out door activities, 2 this year (2015)    Tobacco Use: Social History   Tobacco Use  Smoking Status Former Smoker  . Packs/day: 1.00  . Years: 26.00  . Pack years: 26.00  . Types: Cigarettes  . Quit date: 07/01/1961  . Years since quitting: 58.8  Smokeless Tobacco Never Used    Labs: Recent Review Flowsheet Data   There is no flowsheet data to display.     Capillary Blood Glucose: No results found for: GLUCAP   Pulmonary Assessment Scores:  Pulmonary Assessment Scores    Row Name 04/13/20 1401         ADL UCSD   ADL Phase Entry     SOB Score total 23           CAT Score   CAT Score 14           mMRC Score   mMRC Score 1           UCSD: Self-administered rating of dyspnea associated with activities of daily living (ADLs) 6-point scale (0 = "not at all" to 5 = "maximal or unable to do because of breathlessness")  Scoring Scores range from 0 to 120.  Minimally important difference is 5 units  CAT:  CAT can identify the health impairment of COPD patients and is better correlated with disease progression.  CAT has a scoring range of zero to 40. The CAT score is classified into four groups of low (less than 10), medium (10 - 20), high (21-30) and very high (31-40) based on the impact level of disease on health status. A CAT score over 10 suggests significant symptoms.  A worsening CAT score could be explained by an exacerbation, poor medication adherence, poor inhaler technique, or progression of COPD or comorbid conditions.  CAT MCID is 2 points  mMRC: mMRC (Modified Medical Research Council) Dyspnea Scale is used to assess the degree of baseline functional disability in patients of respiratory disease due to dyspnea. No minimal important difference is established. A decrease in score of 1 point or greater is considered a positive change.   Pulmonary Function Assessment:   Pulmonary Function Assessment - 04/13/20 0931      Breath   Shortness of Breath Yes;Limiting activity           Exercise Target Goals: Exercise Program Goal: Individual exercise prescription set using results from initial 6 min walk test and THRR while considering  patient's activity barriers and safety.   Exercise Prescription Goal: Initial exercise prescription builds to 30-45 minutes a day of aerobic activity, 2-3 days per week.  Home exercise guidelines will be given to patient during program as part of exercise prescription that the participant will acknowledge.  Activity Barriers & Risk Stratification:  Activity Barriers & Cardiac Risk Stratification - 04/13/20 0929      Activity Barriers & Cardiac Risk Stratification   Activity Barriers Deconditioning;Muscular Weakness;Shortness of Breath   Spinal Scoliosis          6 Minute Walk:  6 Minute Walk    Row Name 04/13/20 1353         6 Minute Walk   Phase Initial     Distance 1075 feet     Walk Time 6 minutes     # of Rest Breaks 0     MPH 2.03     METS 1.76     RPE 13     Perceived Dyspnea  1     VO2 Peak 6.17     Symptoms No     Resting HR 80 bpm     Resting BP 102/50     Resting Oxygen Saturation  96 %     Exercise Oxygen Saturation  during 6 min walk 94 %     Max Ex. HR 101 bpm     Max Ex. BP 122/58     2 Minute Post BP 104/52           Interval HR   1 Minute HR 87     2 Minute HR 94     3 Minute HR 101     4 Minute HR 92     5 Minute HR 95     6 Minute HR 94     2 Minute Post HR 83     Interval Heart Rate? Yes           Interval Oxygen   Interval Oxygen? Yes     Baseline Oxygen Saturation % 96 %     1 Minute Oxygen Saturation % 95 %     1 Minute Liters of Oxygen 0 L     2 Minute Oxygen Saturation % 94 %     2 Minute Liters of Oxygen 0  L     3 Minute Oxygen Saturation % 94 %     3 Minute Liters of Oxygen 0 L     4 Minute Oxygen Saturation % 94 %     4 Minute Liters of Oxygen 0 L     5  Minute Oxygen Saturation % 94 %     5 Minute Liters of Oxygen 0 L     6 Minute Oxygen Saturation % 94 %     6 Minute Liters of Oxygen 0 L     2 Minute Post Oxygen Saturation % 96 %     2 Minute Post Liters of Oxygen 0 L            Oxygen Initial Assessment:  Oxygen Initial Assessment - 04/13/20 0931      Home Oxygen   Home Oxygen Device None    Sleep Oxygen Prescription None    Home Exercise Oxygen Prescription None    Home Resting Oxygen Prescription None      Initial 6 min Walk   Oxygen Used None      Program Oxygen Prescription   Program Oxygen Prescription None      Intervention   Short Term Goals To learn and understand importance of monitoring SPO2 with pulse oximeter and demonstrate accurate use of the pulse oximeter.;To learn and understand importance of maintaining oxygen saturations>88%;To learn and demonstrate proper pursed lip breathing techniques or other breathing techniques.;To learn and demonstrate proper use of respiratory medications    Long  Term Goals Verbalizes importance of monitoring SPO2 with pulse oximeter and return demonstration;Maintenance of O2 saturations>88%;Exhibits proper breathing techniques, such as pursed lip breathing or other method taught during program session;Compliance with respiratory medication;Demonstrates proper use of MDI's           Oxygen Re-Evaluation:   Oxygen Discharge (Final Oxygen Re-Evaluation):   Initial Exercise Prescription:  Initial Exercise Prescription - 04/13/20 1400      Date of Initial Exercise RX and Referring Provider   Date 04/13/20    Referring Provider Dr. Loanne Drilling    Expected Discharge Date 06/18/20      NuStep   Level 1    SPM 75    Minutes 15      Track   Minutes 15      Prescription Details   Frequency (times per week) 2    Duration Progress to 30 minutes of continuous aerobic without signs/symptoms of physical distress      Intensity   THRR 40-80% of Max Heartrate 54-109    Ratings  of Perceived Exertion 11-13    Perceived Dyspnea 0-4      Progression   Progression Continue to progress workloads to maintain intensity without signs/symptoms of physical distress.      Resistance Training   Training Prescription Yes    Weight orange bands    Reps 10-15           Perform Capillary Blood Glucose checks as needed.  Exercise Prescription Changes:  Exercise Prescription Changes    Row Name 04/28/20 1500             Response to Exercise   Blood Pressure (Admit) 132/84       Blood Pressure (Exercise) 114/60       Blood Pressure (Exit) 126/60       Heart Rate (Admit) 69 bpm       Heart Rate (Exercise) 81 bpm       Heart Rate (Exit) 2.1  bpm       Oxygen Saturation (Admit) 98 %       Oxygen Saturation (Exercise) 97 %       Oxygen Saturation (Exit) 96 %       Rating of Perceived Exertion (Exercise) 11       Perceived Dyspnea (Exercise) 1       Duration Continue with 30 min of aerobic exercise without signs/symptoms of physical distress.       Intensity --  40-80% HRR               Progression   Progression Continue to progress workloads to maintain intensity without signs/symptoms of physical distress.               Resistance Training   Training Prescription Yes       Weight orange bands       Reps 10-15       Time 10 Minutes               NuStep   Level 1       SPM 80       Minutes 15       METs 1.3               Track   Laps 15       Minutes 15              Exercise Comments:  Exercise Comments    Row Name 04/21/20 1543           Exercise Comments Pt completed first exercise session and tolerated well with no complaints or concerns. She was able to do 30 minutes of consistent cardiovascular exercise.She also did all of the resistance training exercises and stretches with no complaints.              Exercise Goals and Review:  Exercise Goals    Row Name 04/13/20 1036             Exercise Goals   Increase Physical  Activity Yes       Intervention Provide advice, education, support and counseling about physical activity/exercise needs.;Develop an individualized exercise prescription for aerobic and resistive training based on initial evaluation findings, risk stratification, comorbidities and participant's personal goals.       Expected Outcomes Short Term: Attend rehab on a regular basis to increase amount of physical activity.;Long Term: Add in home exercise to make exercise part of routine and to increase amount of physical activity.;Long Term: Exercising regularly at least 3-5 days a week.       Increase Strength and Stamina Yes       Intervention Provide advice, education, support and counseling about physical activity/exercise needs.;Develop an individualized exercise prescription for aerobic and resistive training based on initial evaluation findings, risk stratification, comorbidities and participant's personal goals.       Expected Outcomes Short Term: Increase workloads from initial exercise prescription for resistance, speed, and METs.;Short Term: Perform resistance training exercises routinely during rehab and add in resistance training at home;Long Term: Improve cardiorespiratory fitness, muscular endurance and strength as measured by increased METs and functional capacity (6MWT)       Able to understand and use rate of perceived exertion (RPE) scale Yes       Intervention Provide education and explanation on how to use RPE scale       Expected Outcomes Short Term: Able to use RPE daily in rehab to express subjective intensity  level;Long Term:  Able to use RPE to guide intensity level when exercising independently       Able to understand and use Dyspnea scale Yes       Intervention Provide education and explanation on how to use Dyspnea scale       Expected Outcomes Short Term: Able to use Dyspnea scale daily in rehab to express subjective sense of shortness of breath during exertion;Long Term: Able to  use Dyspnea scale to guide intensity level when exercising independently       Knowledge and understanding of Target Heart Rate Range (THRR) Yes       Intervention Provide education and explanation of THRR including how the numbers were predicted and where they are located for reference       Expected Outcomes Short Term: Able to state/look up THRR;Long Term: Able to use THRR to govern intensity when exercising independently;Short Term: Able to use daily as guideline for intensity in rehab       Understanding of Exercise Prescription Yes       Intervention Provide education, explanation, and written materials on patient's individual exercise prescription       Expected Outcomes Short Term: Able to explain program exercise prescription;Long Term: Able to explain home exercise prescription to exercise independently              Exercise Goals Re-Evaluation :   Discharge Exercise Prescription (Final Exercise Prescription Changes):  Exercise Prescription Changes - 04/28/20 1500      Response to Exercise   Blood Pressure (Admit) 132/84    Blood Pressure (Exercise) 114/60    Blood Pressure (Exit) 126/60    Heart Rate (Admit) 69 bpm    Heart Rate (Exercise) 81 bpm    Heart Rate (Exit) 2.1 bpm    Oxygen Saturation (Admit) 98 %    Oxygen Saturation (Exercise) 97 %    Oxygen Saturation (Exit) 96 %    Rating of Perceived Exertion (Exercise) 11    Perceived Dyspnea (Exercise) 1    Duration Continue with 30 min of aerobic exercise without signs/symptoms of physical distress.    Intensity --   40-80% HRR     Progression   Progression Continue to progress workloads to maintain intensity without signs/symptoms of physical distress.      Resistance Training   Training Prescription Yes    Weight orange bands    Reps 10-15    Time 10 Minutes      NuStep   Level 1    SPM 80    Minutes 15    METs 1.3      Track   Laps 15    Minutes 15           Nutrition:  Target Goals:  Understanding of nutrition guidelines, daily intake of sodium <1555m, cholesterol <2033m calories 30% from fat and 7% or less from saturated fats, daily to have 5 or more servings of fruits and vegetables.  Biometrics:  Pre Biometrics - 04/13/20 1348      Pre Biometrics   Grip Strength 19 kg            Nutrition Therapy Plan and Nutrition Goals:   Nutrition Assessments:  MEDIFICTS Score Key:  ?70 Need to make dietary changes   40-70 Heart Healthy Diet  ? 40 Therapeutic Level Cholesterol Diet   Picture Your Plate Scores:  <4<14nhealthy dietary pattern with much room for improvement.  41-50 Dietary pattern unlikely to meet recommendations for good  health and room for improvement.  51-60 More healthful dietary pattern, with some room for improvement.   >60 Healthy dietary pattern, although there may be some specific behaviors that could be improved.    Nutrition Goals Re-Evaluation:   Nutrition Goals Discharge (Final Nutrition Goals Re-Evaluation):   Psychosocial: Target Goals: Acknowledge presence or absence of significant depression and/or stress, maximize coping skills, provide positive support system. Participant is able to verbalize types and ability to use techniques and skills needed for reducing stress and depression.  Initial Review & Psychosocial Screening:  Initial Psych Review & Screening - 04/13/20 1349      Screening Interventions   Expected Outcomes Long Term Goal: Stressors or current issues are controlled or eliminated.;Long Term goal: The participant improves quality of Life and PHQ9 Scores as seen by post scores and/or verbalization of changes           Quality of Life Scores:  Scores of 19 and below usually indicate a poorer quality of life in these areas.  A difference of  2-3 points is a clinically meaningful difference.  A difference of 2-3 points in the total score of the Quality of Life Index has been associated with significant  improvement in overall quality of life, self-image, physical symptoms, and general health in studies assessing change in quality of life.  PHQ-9: Recent Review Flowsheet Data    Depression screen [facility] 2/9 04/13/2020   Decreased Interest 0   Down, Depressed, Hopeless 0   PHQ - 2 Score 0   Altered sleeping 3   Tired, decreased energy 3   Change in appetite 0   Feeling bad or failure about yourself  0   Trouble concentrating 2   Suicidal thoughts 0   Difficult doing work/chores Not difficult at all     Interpretation of Total Score  Total Score Depression Severity:  1-4 = Minimal depression, 5-9 = Mild depression, 10-14 = Moderate depression, 15-19 = Moderately severe depression, 20-27 = Severe depression   Psychosocial Evaluation and Intervention:  Psychosocial Evaluation - 04/13/20 1031      Psychosocial Evaluation & Interventions   Interventions Encouraged to exercise with the program and follow exercise prescription    Comments Pt and husband live with their son, which has decreased a lot of stress and made home management a lot easier. Pt denies any history or current depression. Pt seems to be in a good state of mind.    Expected Outcomes Improve quality of life and decrease shortness of breath with daily activities.    Continue Psychosocial Services  Follow up required by staff           Psychosocial Re-Evaluation:  Psychosocial Re-Evaluation    [place] Name 04/30/20 0903             Psychosocial Re-Evaluation   Current issues with None Identified       Comments No psychosocial concerns identified at this time.       Expected Outcomes For "Chelsea Garcia" to continue to have no psychosocial concerns while participating in pulmonary rehab.       Interventions Encouraged to attend Pulmonary Rehabilitation for the exercise       Continue Psychosocial Services  No Follow up required              Psychosocial Discharge (Final Psychosocial Re-Evaluation):  Psychosocial  Re-Evaluation - 04/30/20 0903      Psychosocial Re-Evaluation   Current issues with None Identified    Comments No psychosocial  concerns identified at this time.    Expected Outcomes For "Chelsea Garcia" to continue to have no psychosocial concerns while participating in pulmonary rehab.    Interventions Encouraged to attend Pulmonary Rehabilitation for the exercise    Continue Psychosocial Services  No Follow up required           Education: Education Goals: Education classes will be provided on a weekly basis, covering required topics. Participant will state understanding/return demonstration of topics presented.  Learning Barriers/Preferences:  Learning Barriers/Preferences - 04/13/20 0934      Learning Barriers/Preferences   Learning Barriers Sight   Has glaucoma   Learning Preferences Skilled Demonstration;Written Material;Individual Instruction           Education Topics: Risk Factor Reduction:  -Group instruction that is supported by a PowerPoint presentation. Instructor discusses the definition of a risk factor, different risk factors for pulmonary disease, and how the heart and lungs work together.     Nutrition for Pulmonary Patient:  -Group instruction provided by PowerPoint slides, verbal discussion, and written materials to support subject matter. The instructor gives an explanation and review of healthy diet recommendations, which includes a discussion on weight management, recommendations for fruit and vegetable consumption, as well as protein, fluid, caffeine, fiber, sodium, sugar, and alcohol. Tips for eating when patients are short of breath are discussed.   Pursed Lip Breathing:  -Group instruction that is supported by demonstration and informational handouts. Instructor discusses the benefits of pursed lip and diaphragmatic breathing and detailed demonstration on how to preform both.     Oxygen Safety:  -Group instruction provided by PowerPoint, verbal discussion,  and written material to support subject matter. There is an overview of "What is Oxygen" and "Why do we need it".  Instructor also reviews how to create a safe environment for oxygen use, the importance of using oxygen as prescribed, and the risks of noncompliance. There is a brief discussion on traveling with oxygen and resources the patient may utilize.   Oxygen Equipment:  -Group instruction provided by [facility] Staff utilizing handouts, written materials, and equipment demonstrations.   Signs and Symptoms:  -Group instruction provided by written material and verbal discussion to support subject matter. Warning signs and symptoms of infection, stroke, and heart attack are reviewed and when to call the physician/911 reinforced. Tips for preventing the spread of infection discussed.   Advanced Directives:  -Group instruction provided by verbal instruction and written material to support subject matter. Instructor reviews Advanced Directive laws and proper instruction for filling out document.   Pulmonary Video:  -Group video education that reviews the importance of medication and oxygen compliance, exercise, good nutrition, pulmonary hygiene, and pursed lip and diaphragmatic breathing for the pulmonary patient.   Exercise for the Pulmonary Patient:  -Group instruction that is supported by a PowerPoint presentation. Instructor discusses benefits of exercise, core components of exercise, frequency, duration, and intensity of an exercise routine, importance of utilizing pulse oximetry during exercise, safety while exercising, and options of places to exercise outside of rehab.     Pulmonary Medications:  -Verbally interactive group education provided by instructor with focus on inhaled medications and proper administration.   Anatomy and Physiology of the Respiratory System and Intimacy:  -Group instruction provided by PowerPoint, verbal discussion, and written material to support  subject matter. Instructor reviews respiratory cycle and anatomical components of the respiratory system and their functions. Instructor also reviews differences in obstructive and restrictive respiratory diseases with examples of each. Intimacy, Sex, and  Sexuality differences are reviewed with a discussion on how relationships can change when diagnosed with pulmonary disease. Common sexual concerns are reviewed.   MD DAY -A group question and answer session with a medical doctor that allows participants to ask questions that relate to their pulmonary disease state.   OTHER EDUCATION -Group or individual verbal, written, or video instructions that support the educational goals of the pulmonary rehab program.   Holiday Eating Survival Tips:  -Group instruction provided by PowerPoint slides, verbal discussion, and written materials to support subject matter. The instructor gives patients tips, tricks, and techniques to help them not only survive but enjoy the holidays despite the onslaught of food that accompanies the holidays.   Knowledge Questionnaire Score:  Knowledge Questionnaire Score - 04/13/20 1408      Knowledge Questionnaire Score   Pre Score 14/18           Core Components/Risk Factors/Patient Goals at Admission:  Personal Goals and Risk Factors at Admission - 04/13/20 1349      Core Components/Risk Factors/Patient Goals on Admission    Weight Management Weight Maintenance           Core Components/Risk Factors/Patient Goals Review:   Goals and Risk Factor Review    Row Name 04/30/20 0904             Core Components/Risk Factors/Patient Goals Review   Personal Goals Review Develop more efficient breathing techniques such as purse lipped breathing and diaphragmatic breathing and practicing self-pacing with activity.;Increase knowledge of respiratory medications and ability to use respiratory devices properly.;Improve shortness of breath with ADL's       Review  "Chelsea Garcia" just started pulmonary rehab, has attended 3 exercise sessions, too early to see progression towards program goals.       Expected Outcomes See admission goals.              Core Components/Risk Factors/Patient Goals at Discharge (Final Review):   Goals and Risk Factor Review - 04/30/20 0904      Core Components/Risk Factors/Patient Goals Review   Personal Goals Review Develop more efficient breathing techniques such as purse lipped breathing and diaphragmatic breathing and practicing self-pacing with activity.;Increase knowledge of respiratory medications and ability to use respiratory devices properly.;Improve shortness of breath with ADL's    Review "Chelsea Garcia" just started pulmonary rehab, has attended 3 exercise sessions, too early to see progression towards program goals.    Expected Outcomes See admission goals.           ITP Comments:   Comments: ITP REVIEW Pt is making expected progress toward pulmonary rehab goals after completing 3 sessions. Recommend continued exercise, life style modification, education, and utilization of breathing techniques to increase stamina and strength and decrease shortness of breath with exertion.

## 2020-05-05 NOTE — Progress Notes (Signed)
Pulmonary Individual Treatment Plan  Patient Details  Name: [name] MRN: [id number] Date of Birth: 08/28/1933 Referring Provider:   April Manson Pulmonary Rehab Walk Test from 04/13/2020 in [place]  Referring Provider Dr. Loanne Drilling      Initial Encounter Date:  Flowsheet Row Pulmonary Rehab Walk Test from 04/13/2020 in [facility]  Date 04/13/20      Visit Diagnosis: Bronchiectasis without complication ([place])  Patient's Home Medications on Admission:   Current Outpatient Medications:  .  acetaminophen (TYLENOL) 500 MG tablet, Take 500 mg by mouth every 6 (six) hours as needed., Disp: , Rfl:  .  brimonidine (ALPHAGAN) 0.15 % ophthalmic solution, 1 drop 2 (two) times daily., Disp: , Rfl:  .  cephALEXin (KEFLEX) 500 MG capsule, Take 500 mg by mouth 2 (two) times daily. 10 day supply, Disp: , Rfl:  .  dorzolamidel-timolol (COSOPT) 22.3-6.8 MG/ML SOLN ophthalmic solution, , Disp: , Rfl:  .  Fluticasone-Salmeterol (ADVAIR DISKUS) 100-50 MCG/DOSE AEPB, Inhale 1 puff into the lungs in the morning and at bedtime., Disp: 60 each, Rfl: 12 .  hydroxyurea (HYDREA) 500 MG capsule, TAKE ONE CAPSULE BY MOUTH DAILY WITH FOOD TO MINIMIZE GI SIDE EFFECTS (Patient not taking: Reported on 04/13/2020), Disp: 90 capsule, Rfl: 2 .  Latanoprostene Bunod (VYZULTA) 0.024 % SOLN, Apply to eye once., Disp: , Rfl:  .  Netarsudil Dimesylate 0.02 % SOLN, INT 1 GTT INTO OU QD, Disp: , Rfl:   Past Medical History: Past Medical History:  Diagnosis Date  . Colon polyps    Benign. 2 colonoscopies in past. Due now again.  . Complication of anesthesia    "got dizzy in 70's"  . Endometriosis    required TAH when young  . Essential tremor    since 40's  . GERD (gastroesophageal reflux disease)    denis  . Glaucoma    started in 40's  . Low back pain    since 40's  . Migraine    started in 50's  . Osteopenia    2014  . Seasonal allergies    to  Dust, mold and pollen.  . Thrombocytosis September 2015  . Tick bites    she likes out door activities, 2 this year (2015)    Tobacco Use: Social History   Tobacco Use  Smoking Status Former Smoker  . Packs/day: 1.00  . Years: 26.00  . Pack years: 26.00  . Types: Cigarettes  . Quit date: 07/01/1961  . Years since quitting: 58.8  Smokeless Tobacco Never Used    Labs: Recent Review Flowsheet Data   There is no flowsheet data to display.     Capillary Blood Glucose: No results found for: GLUCAP   Pulmonary Assessment Scores:  Pulmonary Assessment Scores    Row Name 04/13/20 1401         ADL UCSD   ADL Phase Entry     SOB Score total 23           CAT Score   CAT Score 14           mMRC Score   mMRC Score 1           UCSD: Self-administered rating of dyspnea associated with activities of daily living (ADLs) 6-point scale (0 = "not at all" to 5 = "maximal or unable to do because of breathlessness")  Scoring Scores range from 0 to 120.  Minimally important difference is 5 units  CAT:  CAT can identify the health impairment of COPD patients and is better correlated with disease progression.  CAT has a scoring range of zero to 40. The CAT score is classified into four groups of low (less than 10), medium (10 - 20), high (21-30) and very high (31-40) based on the impact level of disease on health status. A CAT score over 10 suggests significant symptoms.  A worsening CAT score could be explained by an exacerbation, poor medication adherence, poor inhaler technique, or progression of COPD or comorbid conditions.  CAT MCID is 2 points  mMRC: mMRC (Modified Medical Research Council) Dyspnea Scale is used to assess the degree of baseline functional disability in patients of respiratory disease due to dyspnea. No minimal important difference is established. A decrease in score of 1 point or greater is considered a positive change.   Pulmonary Function Assessment:   Pulmonary Function Assessment - 04/13/20 0931      Breath   Shortness of Breath Yes;Limiting activity           Exercise Target Goals: Exercise Program Goal: Individual exercise prescription set using results from initial 6 min walk test and THRR while considering  patient's activity barriers and safety.   Exercise Prescription Goal: Initial exercise prescription builds to 30-45 minutes a day of aerobic activity, 2-3 days per week.  Home exercise guidelines will be given to patient during program as part of exercise prescription that the participant will acknowledge.  Activity Barriers & Risk Stratification:  Activity Barriers & Cardiac Risk Stratification - 04/13/20 0929      Activity Barriers & Cardiac Risk Stratification   Activity Barriers Deconditioning;Muscular Weakness;Shortness of Breath   Spinal Scoliosis          6 Minute Walk:  6 Minute Walk    Row Name 04/13/20 1353         6 Minute Walk   Phase Initial     Distance 1075 feet     Walk Time 6 minutes     # of Rest Breaks 0     MPH 2.03     METS 1.76     RPE 13     Perceived Dyspnea  1     VO2 Peak 6.17     Symptoms No     Resting HR 80 bpm     Resting BP 102/50     Resting Oxygen Saturation  96 %     Exercise Oxygen Saturation  during 6 min walk 94 %     Max Ex. HR 101 bpm     Max Ex. BP 122/58     2 Minute Post BP 104/52           Interval HR   1 Minute HR 87     2 Minute HR 94     3 Minute HR 101     4 Minute HR 92     5 Minute HR 95     6 Minute HR 94     2 Minute Post HR 83     Interval Heart Rate? Yes           Interval Oxygen   Interval Oxygen? Yes     Baseline Oxygen Saturation % 96 %     1 Minute Oxygen Saturation % 95 %     1 Minute Liters of Oxygen 0 L     2 Minute Oxygen Saturation % 94 %     2 Minute Liters of Oxygen 0  L     3 Minute Oxygen Saturation % 94 %     3 Minute Liters of Oxygen 0 L     4 Minute Oxygen Saturation % 94 %     4 Minute Liters of Oxygen 0 L     5  Minute Oxygen Saturation % 94 %     5 Minute Liters of Oxygen 0 L     6 Minute Oxygen Saturation % 94 %     6 Minute Liters of Oxygen 0 L     2 Minute Post Oxygen Saturation % 96 %     2 Minute Post Liters of Oxygen 0 L            Oxygen Initial Assessment:  Oxygen Initial Assessment - 04/13/20 0931      Home Oxygen   Home Oxygen Device None    Sleep Oxygen Prescription None    Home Exercise Oxygen Prescription None    Home Resting Oxygen Prescription None      Initial 6 min Walk   Oxygen Used None      Program Oxygen Prescription   Program Oxygen Prescription None      Intervention   Short Term Goals To learn and understand importance of monitoring SPO2 with pulse oximeter and demonstrate accurate use of the pulse oximeter.;To learn and understand importance of maintaining oxygen saturations>88%;To learn and demonstrate proper pursed lip breathing techniques or other breathing techniques.;To learn and demonstrate proper use of respiratory medications    Long  Term Goals Verbalizes importance of monitoring SPO2 with pulse oximeter and return demonstration;Maintenance of O2 saturations>88%;Exhibits proper breathing techniques, such as pursed lip breathing or other method taught during program session;Compliance with respiratory medication;Demonstrates proper use of MDI's           Oxygen Re-Evaluation:  Oxygen Re-Evaluation    Row Name 05/05/20 1222             Program Oxygen Prescription   Program Oxygen Prescription None               Home Oxygen   Home Oxygen Device None       Sleep Oxygen Prescription None       Home Exercise Oxygen Prescription None       Home Resting Oxygen Prescription None               Goals/Expected Outcomes   Short Term Goals To learn and understand importance of monitoring SPO2 with pulse oximeter and demonstrate accurate use of the pulse oximeter.;To learn and understand importance of maintaining oxygen saturations>88%;To learn and  demonstrate proper pursed lip breathing techniques or other breathing techniques.;To learn and demonstrate proper use of respiratory medications       Long  Term Goals Verbalizes importance of monitoring SPO2 with pulse oximeter and return demonstration;Maintenance of O2 saturations>88%;Exhibits proper breathing techniques, such as pursed lip breathing or other method taught during program session;Compliance with respiratory medication;Demonstrates proper use of MDI's       Goals/Expected Outcomes compliance and understanding of oxygen saturation and importance of pursed lip breathing.              Oxygen Discharge (Final Oxygen Re-Evaluation):  Oxygen Re-Evaluation - 05/05/20 1222      Program Oxygen Prescription   Program Oxygen Prescription None      Home Oxygen   Home Oxygen Device None    Sleep Oxygen Prescription None    Home Exercise Oxygen Prescription None  Home Resting Oxygen Prescription None      Goals/Expected Outcomes   Short Term Goals To learn and understand importance of monitoring SPO2 with pulse oximeter and demonstrate accurate use of the pulse oximeter.;To learn and understand importance of maintaining oxygen saturations>88%;To learn and demonstrate proper pursed lip breathing techniques or other breathing techniques.;To learn and demonstrate proper use of respiratory medications    Long  Term Goals Verbalizes importance of monitoring SPO2 with pulse oximeter and return demonstration;Maintenance of O2 saturations>88%;Exhibits proper breathing techniques, such as pursed lip breathing or other method taught during program session;Compliance with respiratory medication;Demonstrates proper use of MDI's    Goals/Expected Outcomes compliance and understanding of oxygen saturation and importance of pursed lip breathing.           Initial Exercise Prescription:  Initial Exercise Prescription - 04/13/20 1400      Date of Initial Exercise RX and Referring Provider    Date 04/13/20    Referring Provider Dr. Loanne Drilling    Expected Discharge Date 06/18/20      NuStep   Level 1    SPM 75    Minutes 15      Track   Minutes 15      Prescription Details   Frequency (times per week) 2    Duration Progress to 30 minutes of continuous aerobic without signs/symptoms of physical distress      Intensity   THRR 40-80% of Max Heartrate 54-109    Ratings of Perceived Exertion 11-13    Perceived Dyspnea 0-4      Progression   Progression Continue to progress workloads to maintain intensity without signs/symptoms of physical distress.      Resistance Training   Training Prescription Yes    Weight orange bands    Reps 10-15           Perform Capillary Blood Glucose checks as needed.  Exercise Prescription Changes:  Exercise Prescription Changes    Row Name 04/28/20 1500             Response to Exercise   Blood Pressure (Admit) 132/84       Blood Pressure (Exercise) 114/60       Blood Pressure (Exit) 126/60       Heart Rate (Admit) 69 bpm       Heart Rate (Exercise) 81 bpm       Heart Rate (Exit) 2.1 bpm       Oxygen Saturation (Admit) 98 %       Oxygen Saturation (Exercise) 97 %       Oxygen Saturation (Exit) 96 %       Rating of Perceived Exertion (Exercise) 11       Perceived Dyspnea (Exercise) 1       Duration Continue with 30 min of aerobic exercise without signs/symptoms of physical distress.       Intensity --  40-80% HRR               Progression   Progression Continue to progress workloads to maintain intensity without signs/symptoms of physical distress.               Resistance Training   Training Prescription Yes       Weight orange bands       Reps 10-15       Time 10 Minutes               NuStep   Level 1  SPM 80       Minutes 15       METs 1.3               Track   Laps 15       Minutes 15              Exercise Comments:  Exercise Comments    Row Name 04/21/20 1543           Exercise  Comments Pt completed first exercise session and tolerated well with no complaints or concerns. She was able to do 30 minutes of consistent cardiovascular exercise.She also did all of the resistance training exercises and stretches with no complaints.              Exercise Goals and Review:  Exercise Goals    Row Name 04/13/20 1036             Exercise Goals   Increase Physical Activity Yes       Intervention Provide advice, education, support and counseling about physical activity/exercise needs.;Develop an individualized exercise prescription for aerobic and resistive training based on initial evaluation findings, risk stratification, comorbidities and participant's personal goals.       Expected Outcomes Short Term: Attend rehab on a regular basis to increase amount of physical activity.;Long Term: Add in home exercise to make exercise part of routine and to increase amount of physical activity.;Long Term: Exercising regularly at least 3-5 days a week.       Increase Strength and Stamina Yes       Intervention Provide advice, education, support and counseling about physical activity/exercise needs.;Develop an individualized exercise prescription for aerobic and resistive training based on initial evaluation findings, risk stratification, comorbidities and participant's personal goals.       Expected Outcomes Short Term: Increase workloads from initial exercise prescription for resistance, speed, and METs.;Short Term: Perform resistance training exercises routinely during rehab and add in resistance training at home;Long Term: Improve cardiorespiratory fitness, muscular endurance and strength as measured by increased METs and functional capacity (6MWT)       Able to understand and use rate of perceived exertion (RPE) scale Yes       Intervention Provide education and explanation on how to use RPE scale       Expected Outcomes Short Term: Able to use RPE daily in rehab to express subjective  intensity level;Long Term:  Able to use RPE to guide intensity level when exercising independently       Able to understand and use Dyspnea scale Yes       Intervention Provide education and explanation on how to use Dyspnea scale       Expected Outcomes Short Term: Able to use Dyspnea scale daily in rehab to express subjective sense of shortness of breath during exertion;Long Term: Able to use Dyspnea scale to guide intensity level when exercising independently       Knowledge and understanding of Target Heart Rate Range (THRR) Yes       Intervention Provide education and explanation of THRR including how the numbers were predicted and where they are located for reference       Expected Outcomes Short Term: Able to state/look up THRR;Long Term: Able to use THRR to govern intensity when exercising independently;Short Term: Able to use daily as guideline for intensity in rehab       Understanding of Exercise Prescription Yes       Intervention Provide education, explanation, and  written materials on patient's individual exercise prescription       Expected Outcomes Short Term: Able to explain program exercise prescription;Long Term: Able to explain home exercise prescription to exercise independently              Exercise Goals Re-Evaluation :  Exercise Goals Re-Evaluation    Row Name 05/05/20 1220             Exercise Goal Re-Evaluation   Exercise Goals Review Increase Physical Activity;Increase Strength and Stamina;Able to understand and use rate of perceived exertion (RPE) scale;Able to understand and use Dyspnea scale;Knowledge and understanding of Target Heart Rate Range (THRR);Understanding of Exercise Prescription       Comments Pt has completed 2 exercise and tolerated well so far. It is too early to note any progression, but we will continue to monitor and progress as she is able.       Expected Outcomes Through exercise at rehab and home, the patient will decrease shortness of  breath with daily activities and feel confident in carrying out an exercise regimn at home.              Discharge Exercise Prescription (Final Exercise Prescription Changes):  Exercise Prescription Changes - 04/28/20 1500      Response to Exercise   Blood Pressure (Admit) 132/84    Blood Pressure (Exercise) 114/60    Blood Pressure (Exit) 126/60    Heart Rate (Admit) 69 bpm    Heart Rate (Exercise) 81 bpm    Heart Rate (Exit) 2.1 bpm    Oxygen Saturation (Admit) 98 %    Oxygen Saturation (Exercise) 97 %    Oxygen Saturation (Exit) 96 %    Rating of Perceived Exertion (Exercise) 11    Perceived Dyspnea (Exercise) 1    Duration Continue with 30 min of aerobic exercise without signs/symptoms of physical distress.    Intensity --   40-80% HRR     Progression   Progression Continue to progress workloads to maintain intensity without signs/symptoms of physical distress.      Resistance Training   Training Prescription Yes    Weight orange bands    Reps 10-15    Time 10 Minutes      NuStep   Level 1    SPM 80    Minutes 15    METs 1.3      Track   Laps 15    Minutes 15           Nutrition:  Target Goals: Understanding of nutrition guidelines, daily intake of sodium '1500mg'$ , cholesterol '200mg'$ , calories 30% from fat and 7% or less from saturated fats, daily to have 5 or more servings of fruits and vegetables.  Biometrics:  Pre Biometrics - 04/13/20 1348      Pre Biometrics   Grip Strength 19 kg            Nutrition Therapy Plan and Nutrition Goals:   Nutrition Assessments:  MEDIFICTS Score Key:  ?70 Need to make dietary changes   40-70 Heart Healthy Diet  ? 40 Therapeutic Level Cholesterol Diet   Picture Your Plate Scores:  <95 Unhealthy dietary pattern with much room for improvement.  41-50 Dietary pattern unlikely to meet recommendations for good health and room for improvement.  51-60 More healthful dietary pattern, with some room for  improvement.   >60 Healthy dietary pattern, although there may be some specific behaviors that could be improved.    Nutrition Goals Re-Evaluation:  Nutrition Goals Discharge (Final Nutrition Goals Re-Evaluation):   Psychosocial: Target Goals: Acknowledge presence or absence of significant depression and/or stress, maximize coping skills, provide positive support system. Participant is able to verbalize types and ability to use techniques and skills needed for reducing stress and depression.  Initial Review & Psychosocial Screening:  Initial Psych Review & Screening - 04/13/20 1349      Screening Interventions   Expected Outcomes Long Term Goal: Stressors or current issues are controlled or eliminated.;Long Term goal: The participant improves quality of Life and PHQ9 Scores as seen by post scores and/or verbalization of changes           Quality of Life Scores:  Scores of 19 and below usually indicate a poorer quality of life in these areas.  A difference of  2-3 points is a clinically meaningful difference.  A difference of 2-3 points in the total score of the Quality of Life Index has been associated with significant improvement in overall quality of life, self-image, physical symptoms, and general health in studies assessing change in quality of life.  PHQ-9: Recent Review Flowsheet Data    Depression screen [facility] 2/9 04/13/2020   Decreased Interest 0   Down, Depressed, Hopeless 0   PHQ - 2 Score 0   Altered sleeping 3   Tired, decreased energy 3   Change in appetite 0   Feeling bad or failure about yourself  0   Trouble concentrating 2   Suicidal thoughts 0   Difficult doing work/chores Not difficult at all     Interpretation of Total Score  Total Score Depression Severity:  1-4 = Minimal depression, 5-9 = Mild depression, 10-14 = Moderate depression, 15-19 = Moderately severe depression, 20-27 = Severe depression   Psychosocial Evaluation and Intervention:   Psychosocial Evaluation - 04/13/20 1031      Psychosocial Evaluation & Interventions   Interventions Encouraged to exercise with the program and follow exercise prescription    Comments Pt and husband live with their son, which has decreased a lot of stress and made home management a lot easier. Pt denies any history or current depression. Pt seems to be in a good state of mind.    Expected Outcomes Improve quality of life and decrease shortness of breath with daily activities.    Continue Psychosocial Services  Follow up required by staff           Psychosocial Re-Evaluation:  Psychosocial Re-Evaluation    [place] Name 04/30/20 0903             Psychosocial Re-Evaluation   Current issues with None Identified       Comments No psychosocial concerns identified at this time.       Expected Outcomes For "Jeani Hawking" to continue to have no psychosocial concerns while participating in pulmonary rehab.       Interventions Encouraged to attend Pulmonary Rehabilitation for the exercise       Continue Psychosocial Services  No Follow up required              Psychosocial Discharge (Final Psychosocial Re-Evaluation):  Psychosocial Re-Evaluation - 04/30/20 0903      Psychosocial Re-Evaluation   Current issues with None Identified    Comments No psychosocial concerns identified at this time.    Expected Outcomes For "Jeani Hawking" to continue to have no psychosocial concerns while participating in pulmonary rehab.    Interventions Encouraged to attend Pulmonary Rehabilitation for the exercise    Continue Psychosocial Services  No Follow up required           Education: Education Goals: Education classes will be provided on a weekly basis, covering required topics. Participant will state understanding/return demonstration of topics presented.  Learning Barriers/Preferences:  Learning Barriers/Preferences - 04/13/20 0934      Learning Barriers/Preferences   Learning Barriers Sight   Has glaucoma    Learning Preferences Skilled Demonstration;Written Material;Individual Instruction           Education Topics: Risk Factor Reduction:  -Group instruction that is supported by a PowerPoint presentation. Instructor discusses the definition of a risk factor, different risk factors for pulmonary disease, and how the heart and lungs work together.     Nutrition for Pulmonary Patient:  -Group instruction provided by PowerPoint slides, verbal discussion, and written materials to support subject matter. The instructor gives an explanation and review of healthy diet recommendations, which includes a discussion on weight management, recommendations for fruit and vegetable consumption, as well as protein, fluid, caffeine, fiber, sodium, sugar, and alcohol. Tips for eating when patients are short of breath are discussed.   Pursed Lip Breathing:  -Group instruction that is supported by demonstration and informational handouts. Instructor discusses the benefits of pursed lip and diaphragmatic breathing and detailed demonstration on how to preform both.     Oxygen Safety:  -Group instruction provided by PowerPoint, verbal discussion, and written material to support subject matter. There is an overview of "What is Oxygen" and "Why do we need it".  Instructor also reviews how to create a safe environment for oxygen use, the importance of using oxygen as prescribed, and the risks of noncompliance. There is a brief discussion on traveling with oxygen and resources the patient may utilize.   Oxygen Equipment:  -Group instruction provided by [facility] Staff utilizing handouts, written materials, and equipment demonstrations.   Signs and Symptoms:  -Group instruction provided by written material and verbal discussion to support subject matter. Warning signs and symptoms of infection, stroke, and heart attack are reviewed and when to call the physician/911 reinforced. Tips for preventing the spread of  infection discussed.   Advanced Directives:  -Group instruction provided by verbal instruction and written material to support subject matter. Instructor reviews Advanced Directive laws and proper instruction for filling out document.   Pulmonary Video:  -Group video education that reviews the importance of medication and oxygen compliance, exercise, good nutrition, pulmonary hygiene, and pursed lip and diaphragmatic breathing for the pulmonary patient.   Exercise for the Pulmonary Patient:  -Group instruction that is supported by a PowerPoint presentation. Instructor discusses benefits of exercise, core components of exercise, frequency, duration, and intensity of an exercise routine, importance of utilizing pulse oximetry during exercise, safety while exercising, and options of places to exercise outside of rehab.     Pulmonary Medications:  -Verbally interactive group education provided by instructor with focus on inhaled medications and proper administration.   Anatomy and Physiology of the Respiratory System and Intimacy:  -Group instruction provided by PowerPoint, verbal discussion, and written material to support subject matter. Instructor reviews respiratory cycle and anatomical components of the respiratory system and their functions. Instructor also reviews differences in obstructive and restrictive respiratory diseases with examples of each. Intimacy, Sex, and Sexuality differences are reviewed with a discussion on how relationships can change when diagnosed with pulmonary disease. Common sexual concerns are reviewed.   MD DAY -A group question and answer session with a medical doctor that allows participants to ask questions that  relate to their pulmonary disease state.   OTHER EDUCATION -Group or individual verbal, written, or video instructions that support the educational goals of the pulmonary rehab program.   Holiday Eating Survival Tips:  -Group instruction provided  by PowerPoint slides, verbal discussion, and written materials to support subject matter. The instructor gives patients tips, tricks, and techniques to help them not only survive but enjoy the holidays despite the onslaught of food that accompanies the holidays.   Knowledge Questionnaire Score:  Knowledge Questionnaire Score - 04/13/20 1408      Knowledge Questionnaire Score   Pre Score 14/18           Core Components/Risk Factors/Patient Goals at Admission:  Personal Goals and Risk Factors at Admission - 04/13/20 1349      Core Components/Risk Factors/Patient Goals on Admission    Weight Management Weight Maintenance           Core Components/Risk Factors/Patient Goals Review:   Goals and Risk Factor Review    Row Name 04/30/20 0904             Core Components/Risk Factors/Patient Goals Review   Personal Goals Review Develop more efficient breathing techniques such as purse lipped breathing and diaphragmatic breathing and practicing self-pacing with activity.;Increase knowledge of respiratory medications and ability to use respiratory devices properly.;Improve shortness of breath with ADL's       Review "Jeani Hawking" just started pulmonary rehab, has attended 3 exercise sessions, too early to see progression towards program goals.       Expected Outcomes See admission goals.              Core Components/Risk Factors/Patient Goals at Discharge (Final Review):   Goals and Risk Factor Review - 04/30/20 0904      Core Components/Risk Factors/Patient Goals Review   Personal Goals Review Develop more efficient breathing techniques such as purse lipped breathing and diaphragmatic breathing and practicing self-pacing with activity.;Increase knowledge of respiratory medications and ability to use respiratory devices properly.;Improve shortness of breath with ADL's    Review "Jeani Hawking" just started pulmonary rehab, has attended 3 exercise sessions, too early to see progression towards  program goals.    Expected Outcomes See admission goals.           ITP Comments:   Comments:

## 2020-05-06 ENCOUNTER — Inpatient Hospital Stay: Payer: Medicare Other | Attending: Hematology

## 2020-05-06 ENCOUNTER — Telehealth: Payer: Self-pay | Admitting: Hematology

## 2020-05-06 ENCOUNTER — Inpatient Hospital Stay (HOSPITAL_BASED_OUTPATIENT_CLINIC_OR_DEPARTMENT_OTHER): Payer: Medicare Other | Admitting: Hematology

## 2020-05-06 ENCOUNTER — Encounter: Payer: Self-pay | Admitting: Hematology

## 2020-05-06 VITALS — BP 123/63 | HR 73 | Temp 97.0°F | Resp 18 | Ht 59.0 in | Wt 115.1 lb

## 2020-05-06 DIAGNOSIS — D473 Essential (hemorrhagic) thrombocythemia: Secondary | ICD-10-CM | POA: Insufficient documentation

## 2020-05-06 DIAGNOSIS — K589 Irritable bowel syndrome without diarrhea: Secondary | ICD-10-CM | POA: Insufficient documentation

## 2020-05-06 DIAGNOSIS — K5790 Diverticulosis of intestine, part unspecified, without perforation or abscess without bleeding: Secondary | ICD-10-CM | POA: Insufficient documentation

## 2020-05-06 DIAGNOSIS — M419 Scoliosis, unspecified: Secondary | ICD-10-CM | POA: Insufficient documentation

## 2020-05-06 DIAGNOSIS — R161 Splenomegaly, not elsewhere classified: Secondary | ICD-10-CM | POA: Diagnosis not present

## 2020-05-06 DIAGNOSIS — K219 Gastro-esophageal reflux disease without esophagitis: Secondary | ICD-10-CM | POA: Insufficient documentation

## 2020-05-06 LAB — CBC WITH DIFFERENTIAL (CANCER CENTER ONLY)
Abs Immature Granulocytes: 0.55 10*3/uL — ABNORMAL HIGH (ref 0.00–0.07)
Basophils Absolute: 0.1 10*3/uL (ref 0.0–0.1)
Basophils Relative: 1 %
Eosinophils Absolute: 0.1 10*3/uL (ref 0.0–0.5)
Eosinophils Relative: 1 %
HCT: 37.8 % (ref 36.0–46.0)
Hemoglobin: 12 g/dL (ref 12.0–15.0)
Immature Granulocytes: 4 %
Lymphocytes Relative: 5 %
Lymphs Abs: 0.6 10*3/uL — ABNORMAL LOW (ref 0.7–4.0)
MCH: 28.4 pg (ref 26.0–34.0)
MCHC: 31.7 g/dL (ref 30.0–36.0)
MCV: 89.6 fL (ref 80.0–100.0)
Monocytes Absolute: 0.7 10*3/uL (ref 0.1–1.0)
Monocytes Relative: 5 %
Neutro Abs: 10.5 10*3/uL — ABNORMAL HIGH (ref 1.7–7.7)
Neutrophils Relative %: 84 %
Platelet Count: 416 10*3/uL — ABNORMAL HIGH (ref 150–400)
RBC: 4.22 MIL/uL (ref 3.87–5.11)
RDW: 17.2 % — ABNORMAL HIGH (ref 11.5–15.5)
WBC Count: 12.5 10*3/uL — ABNORMAL HIGH (ref 4.0–10.5)
nRBC: 0.2 % (ref 0.0–0.2)

## 2020-05-06 NOTE — Telephone Encounter (Signed)
Scheduled follow-up appointments per 4/27 los. Patient is aware. 

## 2020-05-07 ENCOUNTER — Encounter (HOSPITAL_COMMUNITY): Payer: Medicare Other

## 2020-05-07 ENCOUNTER — Telehealth (HOSPITAL_COMMUNITY): Payer: Self-pay | Admitting: *Deleted

## 2020-05-08 ENCOUNTER — Emergency Department (HOSPITAL_COMMUNITY): Payer: Medicare Other

## 2020-05-08 ENCOUNTER — Other Ambulatory Visit: Payer: Self-pay

## 2020-05-08 ENCOUNTER — Emergency Department (HOSPITAL_COMMUNITY)
Admission: EM | Admit: 2020-05-08 | Discharge: 2020-05-08 | Disposition: A | Discharge status: Home / Self Care | Payer: Medicare Other | Attending: Emergency Medicine | Admitting: Emergency Medicine

## 2020-05-08 ENCOUNTER — Encounter (HOSPITAL_COMMUNITY): Payer: Self-pay

## 2020-05-08 DIAGNOSIS — Z87891 Personal history of nicotine dependence: Secondary | ICD-10-CM | POA: Insufficient documentation

## 2020-05-08 DIAGNOSIS — R1084 Generalized abdominal pain: Secondary | ICD-10-CM | POA: Diagnosis not present

## 2020-05-08 DIAGNOSIS — R1031 Right lower quadrant pain: Secondary | ICD-10-CM | POA: Diagnosis not present

## 2020-05-08 DIAGNOSIS — K219 Gastro-esophageal reflux disease without esophagitis: Secondary | ICD-10-CM | POA: Diagnosis not present

## 2020-05-08 DIAGNOSIS — R161 Splenomegaly, not elsewhere classified: Secondary | ICD-10-CM | POA: Diagnosis not present

## 2020-05-08 LAB — URINALYSIS, ROUTINE W REFLEX MICROSCOPIC
Bilirubin Urine: NEGATIVE
Glucose, UA: NEGATIVE mg/dL
Hgb urine dipstick: NEGATIVE
Ketones, ur: NEGATIVE mg/dL
Leukocytes,Ua: NEGATIVE
Nitrite: NEGATIVE
Protein, ur: 30 mg/dL — AB
Specific Gravity, Urine: 1.013 (ref 1.005–1.030)
pH: 5 (ref 5.0–8.0)

## 2020-05-08 LAB — COMPREHENSIVE METABOLIC PANEL
ALT: 12 U/L (ref 0–44)
AST: 19 U/L (ref 15–41)
Albumin: 4.8 g/dL (ref 3.5–5.0)
Alkaline Phosphatase: 45 U/L (ref 38–126)
Anion gap: 9 (ref 5–15)
BUN: 36 mg/dL — ABNORMAL HIGH (ref 8–23)
CO2: 26 mmol/L (ref 22–32)
Calcium: 9.8 mg/dL (ref 8.9–10.3)
Chloride: 107 mmol/L (ref 98–111)
Creatinine, Ser: 1.3 mg/dL — ABNORMAL HIGH (ref 0.44–1.00)
GFR, Estimated: 41 mL/min — ABNORMAL LOW (ref >60)
Glucose, Bld: 111 mg/dL — ABNORMAL HIGH (ref 70–99)
Potassium: 4.5 mmol/L (ref 3.5–5.1)
Sodium: 142 mmol/L (ref 135–145)
Total Bilirubin: 0.6 mg/dL (ref 0.3–1.2)
Total Protein: 7.5 g/dL (ref 6.5–8.1)

## 2020-05-08 LAB — CBC WITH DIFFERENTIAL/PLATELET
Abs Immature Granulocytes: 0.77 10*3/uL — ABNORMAL HIGH (ref 0.00–0.07)
Basophils Absolute: 0.1 10*3/uL (ref 0.0–0.1)
Basophils Relative: 1 %
Eosinophils Absolute: 0.1 10*3/uL (ref 0.0–0.5)
Eosinophils Relative: 1 %
HCT: 39.8 % (ref 36.0–46.0)
Hemoglobin: 12.3 g/dL (ref 12.0–15.0)
Immature Granulocytes: 5 %
Lymphocytes Relative: 3 %
Lymphs Abs: 0.4 10*3/uL — ABNORMAL LOW (ref 0.7–4.0)
MCH: 28.1 pg (ref 26.0–34.0)
MCHC: 30.9 g/dL (ref 30.0–36.0)
MCV: 90.9 fL (ref 80.0–100.0)
Monocytes Absolute: 0.7 10*3/uL (ref 0.1–1.0)
Monocytes Relative: 5 %
Neutro Abs: 12.4 10*3/uL — ABNORMAL HIGH (ref 1.7–7.7)
Neutrophils Relative %: 85 %
Platelets: 451 10*3/uL — ABNORMAL HIGH (ref 150–400)
RBC: 4.38 MIL/uL (ref 3.87–5.11)
RDW: 17.3 % — ABNORMAL HIGH (ref 11.5–15.5)
WBC: 14.5 10*3/uL — ABNORMAL HIGH (ref 4.0–10.5)
nRBC: 0 % (ref 0.0–0.2)

## 2020-05-08 LAB — LIPASE, BLOOD: Lipase: 39 U/L (ref 11–51)

## 2020-05-08 MED ORDER — ONDANSETRON HCL 4 MG/2ML IJ SOLN
4.0000 mg | Freq: Once | INTRAMUSCULAR | Status: AC
  Administered 2020-05-08: 4 mg via INTRAVENOUS
  Filled 2020-05-08: qty 2

## 2020-05-08 MED ORDER — SODIUM CHLORIDE 0.9 % IV BOLUS
500.0000 mL | Freq: Once | INTRAVENOUS | Status: AC
  Administered 2020-05-08: 500 mL via INTRAVENOUS

## 2020-05-08 MED ORDER — FENTANYL CITRATE (PF) 100 MCG/2ML IJ SOLN
50.0000 ug | INTRAMUSCULAR | Status: DC | PRN
Start: 2020-05-08 — End: 2020-05-09

## 2020-05-08 MED ORDER — IOHEXOL 300 MG/ML  SOLN
80.0000 mL | Freq: Once | INTRAMUSCULAR | Status: AC | PRN
  Administered 2020-05-08: 80 mL via INTRAVENOUS

## 2020-05-08 MED ORDER — KETOROLAC TROMETHAMINE 30 MG/ML IJ SOLN
15.0000 mg | Freq: Once | INTRAMUSCULAR | Status: AC
  Administered 2020-05-08: 15 mg via INTRAVENOUS
  Filled 2020-05-08: qty 1

## 2020-05-08 MED ORDER — DICYCLOMINE HCL 20 MG PO TABS: 20.0000 mg | ORAL_TABLET | Freq: Two times a day (BID) | ORAL | 0 refills | Status: DC

## 2020-05-08 MED ORDER — OXYCODONE-ACETAMINOPHEN 5-325 MG PO TABS: 1.0000 | ORAL_TABLET | Freq: Four times a day (QID) | ORAL | Status: DC | PRN

## 2020-05-08 MED ORDER — HYDROCODONE-ACETAMINOPHEN 5-325 MG PO TABS: 2.0000 | ORAL_TABLET | Freq: Two times a day (BID) | ORAL | 0 refills | Status: DC | PRN

## 2020-05-08 MED ORDER — DICYCLOMINE HCL 10 MG PO CAPS
10.0000 mg | ORAL_CAPSULE | Freq: Once | ORAL | Status: AC
  Administered 2020-05-08: 10 mg via ORAL
  Filled 2020-05-08: qty 1

## 2020-05-08 NOTE — ED Triage Notes (Signed)
Emergency Medicine Provider Triage Evaluation Note  Chelsea Garcia , a 87 y.o. female  was evaluated in triage.  Pt complains of presents with right lower abdominal pain started 5 AM this morning as a crampy-like sensation, pain has improved since then.  Has associated nausea with dry heaving denies constipation or diarrhea.  Has no abdominal history..  Review of Systems  Positive: Abdominal crampiness and dry heaving Negative: Vomiting, diarrhea, constipation  Physical Exam  BP 134/85 (BP Location: Left Arm)   Pulse 85   Temp 98 F (36.7 C) (Oral)   Resp 18   SpO2 98%  Gen:   Awake, no distress   HEENT:  Atraumatic  Resp:  Normal effort  Cardiac:  Normal rate  Abd:   Abdomen is nondistended, dull to percussion, no peritoneal sign guarding, right lower quadrant tenderness noted MSK:   Moves extremities without difficulty  Neuro:  Speech clear   Medical Decision Making  Medically screening exam initiated at 1:03 PM.  Appropriate orders placed.  Chelsea Garcia was informed that the remainder of the evaluation will be completed by another provider, this initial triage assessment does not replace that evaluation, and the importance of remaining in the ED until their evaluation is complete.  Clinical Impression  Right lower quadrant pain started this morning, lab work has been ordered, patient will need further work-up here in the emergency department.   Marcello Fennel, PA-C 05/08/20 1305

## 2020-05-08 NOTE — ED Notes (Signed)
Pt walked to restroom with standby assist.

## 2020-05-08 NOTE — ED Triage Notes (Signed)
Pt arrived via walk in, c/o RLQ abd pain that started this morning. Feels nauseated but denied vomiting or diarrhea.

## 2020-05-08 NOTE — Discharge Instructions (Addendum)
We saw you in the ER for abdominal pain.  We had ordered CT scan that is overall reassuring. We did notice that your spleen is slightly enlarged.  It is likely because of your thrombocytosis for which you see Dr. Annamaria Boots.  I have sent a message to Dr. Annamaria Boots about this visit for her to review the CT scan.  Please take the medications prescribed for pain control.  See your PCP in 1 week.  Return to the ER if you start having bloody stools, severe pain, fevers, chills.

## 2020-05-08 NOTE — ED Notes (Signed)
Patient unable to sign due to tremor

## 2020-05-08 NOTE — ED Provider Notes (Signed)
[facility] DEPT Provider Note   CSN: [id number] Arrival date & time: 05/08/20  1232     History Chief Complaint  Patient presents with  . Abdominal Pain    Chelsea Garcia is a 87 y.o. female.  HPI     87 year old comes in a chief complaint of abdominal pain.  Patient has history of essential thrombocytosis for which she sees hematology.  She also has history of abdominal hysterectomy and hernia.  Patient comes in abdominal pain that started at 5 AM.  Pain is generalized and described as crampy type pain, worse on the right side with associated nausea, dry heaving.  Patient denies any constipation or diarrhea.  Review of system is negative for any fevers, chills, sick contacts.  No history of similar pain.  No history of kidney stones and patient denies any UTI-like symptoms.  Past Medical History:  Diagnosis Date  . Colon polyps    Benign. 2 colonoscopies in past. Due now again.  . Complication of anesthesia    "got dizzy in 70's"  . Endometriosis    required TAH when young  . Essential tremor    since 40's  . GERD (gastroesophageal reflux disease)    denis  . Glaucoma    started in 40's  . Low back pain    since 40's  . Migraine    started in 50's  . Osteopenia    2014  . Seasonal allergies    to Dust, mold and pollen.  . Thrombocytosis September 2015  . Tick bites    she likes out door activities, 2 this year (2015)    Patient Active Problem List   Diagnosis Date Noted  . Bronchiectasis without complication ([facility]) 31/54/0086  . Essential thrombocytosis ([facility]) 10/01/2013    Past Surgical History:  Procedure Laterality Date  . CATARACT EXTRACTION Left 02/07/2013  . INGUINAL HERNIA REPAIR Left 01/09/2020   Procedure: LEFT INGUINAL HERNIA REPAIR WITH MESH;  Surgeon: Erroll Luna, MD;  Location: [facility];  Service: General;  Laterality: Left;  . INSERTION OF MESH Left 01/09/2020   Procedure: INSERTION OF MESH;   Surgeon: Erroll Luna, MD;  Location: [facility];  Service: General;  Laterality: Left;  . TOTAL ABDOMINAL HYSTERECTOMY  1970's  . TUBAL LIGATION       OB History   No obstetric history on file.     Family History  Problem Relation Age of Onset  . Thyroid disease Mother   . Cancer Father   . Cancer Maternal Aunt 27    Social History   Tobacco Use  . Smoking status: Former Smoker    Packs/day: 1.00    Years: 26.00    Pack years: 26.00    Types: Cigarettes    Quit date: 07/01/1961    Years since quitting: 58.8  . Smokeless tobacco: Never Used  Substance Use Topics  . Alcohol use: Yes    Comment: very rare  . Drug use: No    Home Medications Prior to Admission medications   Medication Sig Start Date End Date Taking? Authorizing Provider  acetaminophen (TYLENOL) 500 MG tablet Take 500 mg by mouth every 6 (six) hours as needed for moderate pain.   Yes [provider]  brimonidine (ALPHAGAN) 0.15 % ophthalmic solution Place 1 drop into both eyes 2 (two) times daily. 08/16/19  Yes [provider]  calcium carbonate (OS-CAL) 1250 (500 Ca) MG chewable tablet Chew 1 tablet by mouth daily.  Yes [provider]  dicyclomine (BENTYL) 20 MG tablet Take 1 tablet (20 mg total) by mouth 2 (two) times daily. 05/08/20  Yes Kae Lauman, MD  fluticasone-salmeterol (ADVAIR) 100-50 MCG/ACT AEPB Inhale 1 puff into the lungs 2 (two) times daily.   Yes [provider]  HYDROcodone-acetaminophen (NORCO/VICODIN) 5-325 MG tablet Take 2 tablets by mouth every 12 (twelve) hours as needed for severe pain (severe pain not better with tylenol/ibuprofen). 05/08/20  Yes Coda Mathey, MD  Latanoprostene Bunod (VYZULTA) 0.024 % SOLN Place 1 drop into both eyes daily.   Yes [provider]  Multiple Vitamin (MULTI VITAMIN) TABS Take 1 tablet by mouth 2 (two) times daily.   Yes [provider]  Netarsudil Dimesylate 0.02 % SOLN Place 1  drop into both eyes daily. 10/12/17  Yes [provider]  Fluticasone-Salmeterol (ADVAIR DISKUS) 100-50 MCG/DOSE AEPB Inhale 1 puff into the lungs in the morning and at bedtime. Patient not taking: No sig reported 10/23/19   Margaretha Seeds, MD    Allergies    Nystatin, Bee pollen, Pollen extract, Albuterol, Mangifera indica fruit ext (mango) [mangifera indica], Mite (d. farinae), and Sulfamethoxazole  Review of Systems   Review of Systems  Constitutional: Positive for activity change.  Gastrointestinal: Positive for abdominal pain and nausea.  Genitourinary: Negative for dysuria.  Allergic/Immunologic: Negative for immunocompromised state.  Hematological: Does not bruise/bleed easily.  All other systems reviewed and are negative.   Physical Exam Updated Vital Signs BP 91/68   Pulse 85   Temp 98 F (36.7 C) (Oral)   Resp 19   SpO2 96%   Physical Exam Vitals and nursing note reviewed.  Constitutional:      Appearance: She is well-developed.  HENT:     Head: Normocephalic and atraumatic.  Cardiovascular:     Rate and Rhythm: Normal rate.  Pulmonary:     Effort: Pulmonary effort is normal.  Abdominal:     General: Bowel sounds are normal.     Tenderness: There is generalized abdominal tenderness and tenderness in the right lower quadrant and suprapubic area. There is no guarding or rebound.  Musculoskeletal:     Cervical back: Normal range of motion and neck supple.  Skin:    General: Skin is warm and dry.  Neurological:     Mental Status: She is alert and oriented to person, place, and time.     ED Results / Procedures / Treatments   Labs (all labs ordered are listed, but only abnormal results are displayed) Labs Reviewed  COMPREHENSIVE METABOLIC PANEL - Abnormal; Notable for the following components:      Result Value   Glucose, Bld 111 (*)    BUN 36 (*)    Creatinine, Ser 1.30 (*)    GFR, Estimated 41 (*)    All other components within normal  limits  CBC WITH DIFFERENTIAL/PLATELET - Abnormal; Notable for the following components:   WBC 14.5 (*)    RDW 17.3 (*)    Platelets 451 (*)    Neutro Abs 12.4 (*)    Lymphs Abs 0.4 (*)    Abs Immature Granulocytes 0.77 (*)    All other components within normal limits  URINALYSIS, ROUTINE W REFLEX MICROSCOPIC - Abnormal; Notable for the following components:   Protein, ur 30 (*)    Bacteria, UA RARE (*)    All other components within normal limits  LIPASE, BLOOD    EKG None  Radiology CT ABDOMEN PELVIS W CONTRAST  Result Date: 05/08/2020 CLINICAL DATA:  Right lower quadrant pain since this morning, nausea EXAM: CT ABDOMEN AND PELVIS WITH CONTRAST TECHNIQUE: Multidetector CT imaging of the abdomen and pelvis was performed using the standard protocol following bolus administration of intravenous contrast. CONTRAST:  54mL OMNIPAQUE IOHEXOL 300 MG/ML  SOLN COMPARISON:  11/07/2019, 03/21/2017 FINDINGS: Lower chest: Heart is enlarged without pericardial effusion. No acute pleural or parenchymal lung disease. Hepatobiliary: No focal liver abnormality is seen. No gallstones, gallbladder wall thickening, or biliary dilatation. Pancreas: Unremarkable. No pancreatic ductal dilatation or surrounding inflammatory changes. Spleen: There is progressive massive splenomegaly, with the spleen measuring 19.5 cm in craniocaudal length. No focal parenchymal abnormalities. Adrenals/Urinary Tract: There is mild bilateral renal cortical atrophy. No focal parenchymal abnormalities. There is marked distension of the bilateral renal pelves, left greater than right, without frank hydronephrosis. This may reflect an element of congenital UPJ narrowing. No urinary tract calculi. The adrenals and bladder are normal. Stomach/Bowel: No bowel obstruction or ileus. Diffuse diverticulosis of the sigmoid colon without evidence of diverticulitis. Normal retrocecal appendix. No bowel wall thickening or inflammatory change.  Vascular/Lymphatic: Aortic atherosclerosis. No enlarged abdominal or pelvic lymph nodes. Reproductive: Status post hysterectomy. No adnexal masses. Other: Trace free fluid within the lower pelvis. No free intraperitoneal gas. No abdominal wall hernia. Musculoskeletal: No acute or destructive bony lesions. Progressive multilevel spondylosis and rotatory scoliosis. Reconstructed images demonstrate no additional findings. IMPRESSION: 1. Marked splenomegaly, which has progressed since prior study. 2. Sigmoid diverticulosis without diverticulitis. 3. Trace pelvic free fluid, nonspecific. 4. Large bilateral extrarenal pelves, without hydronephrosis. 5. Normal appendix. Electronically Signed   By: Randa Ngo M.D.   On: 05/08/2020 19:56    Procedures Procedures   Medications Ordered in ED Medications  oxyCODONE-acetaminophen (PERCOCET/ROXICET) 5-325 MG per tablet 1 tablet (has no administration in time range)  fentaNYL (SUBLIMAZE) injection 50 mcg (has no administration in time range)  dicyclomine (BENTYL) capsule 10 mg (has no administration in time range)  ketorolac (TORADOL) 30 MG/ML injection 15 mg (has no administration in time range)  ondansetron (ZOFRAN) injection 4 mg (4 mg Intravenous Given 05/08/20 1730)  sodium chloride 0.9 % bolus 500 mL (0 mLs Intravenous Stopped 05/08/20 1950)  iohexol (OMNIPAQUE) 300 MG/ML solution 80 mL (80 mLs Intravenous Contrast Given 05/08/20 1924)    ED Course  I have reviewed the triage vital signs and the nursing notes.  Pertinent labs & imaging results that were available during my care of the patient were reviewed by me and considered in my medical decision making (see chart for details).    MDM Rules/Calculators/A&P                         87 year old comes in a chief complaint of intermittent abdominal pain that is severe.  Pain started this morning and there is no history of it.  Review of system is overall reassuring with no fevers, chills, sick  contacts.  On exam patient had right-sided abdominal tenderness predominantly, but the pain was generalized over the lower quadrants.  She did have what appeared to be possible hernia over the right side and also some muscle spasms and splenomegaly.  CT ordered to ensure there was no evidence of thrombosis, infarct, AAA, SBO, ileus.  Results of the CT scan are reassuring besides splenomegaly.  Results of the work-up has been discussed with the patient.  I have sent an email to Dr. Annamaria Boots about the CT findings.  At this time  I think we can proceed with conservative management, patient advised to follow-up with PCP.   Final Clinical Impression(s) / ED Diagnoses Final diagnoses:  Generalized abdominal pain  Splenomegaly    Rx / DC Orders ED Discharge Orders         Ordered    dicyclomine (BENTYL) 20 MG tablet  2 times daily        05/08/20 2214    HYDROcodone-acetaminophen (NORCO/VICODIN) 5-325 MG tablet  Every 12 hours PRN        05/08/20 2214           Varney Biles, MD 05/08/20 2226

## 2020-05-12 ENCOUNTER — Encounter (HOSPITAL_COMMUNITY): Payer: Medicare Other

## 2020-05-12 ENCOUNTER — Telehealth (HOSPITAL_COMMUNITY): Payer: Self-pay | Admitting: Family Medicine

## 2020-05-13 DIAGNOSIS — R109 Unspecified abdominal pain: Secondary | ICD-10-CM | POA: Diagnosis not present

## 2020-05-14 ENCOUNTER — Encounter (HOSPITAL_COMMUNITY): Payer: Medicare Other

## 2020-05-14 ENCOUNTER — Telehealth: Payer: Self-pay

## 2020-05-14 NOTE — Telephone Encounter (Signed)
-----   Message from Truitt Merle, MD sent at 05/09/2020  3:47 PM EDT ----- Regarding: RE: CT review Thanks, I reviewed her CT scan.  Santiago Glad, please let pt know that I have reviewed her CT scan. Her enlarged spleen is related to her ET, but I don's think that's the source of her right low abdominal pain. If her pain persists, I suggest her f/u with PCP.   Thanks   Truitt Merle ----- Message ----- From: Varney Biles, MD Sent: 05/08/2020  10:16 PM EDT To: Truitt Merle, MD Subject: CT review                                      Hi Dr. Burr Medico,  I had seen Ms. Gatchalian in the ER for abdominal pain. She is being seen by you for essential thrombocytosis.  For her abdominal pain, we are ordered CT scan.  It does not reveal any clear evidence of thrombosis or any other intra-abdominal process besides worsening splenomegaly.  I informed her that I will share this relevant information with you and she is aware that we will contact her only if needed.  Thanks,  Ankit N.

## 2020-05-14 NOTE — Telephone Encounter (Signed)
I spoke with Ms Cilento and let her know Dr Ernestina Penna comments and recommendations regarding her recent Ct scan.  Ms Vera verbalized understanding.

## 2020-05-19 ENCOUNTER — Encounter (HOSPITAL_COMMUNITY)
Admission: RE | Admit: 2020-05-19 | Discharge: 2020-05-19 | Disposition: A | Discharge status: Home / Self Care | Payer: Medicare Other | Source: Ambulatory Visit | Attending: Pulmonary Disease | Admitting: Pulmonary Disease

## 2020-05-19 ENCOUNTER — Ambulatory Visit: Payer: Medicare Other | Admitting: Podiatry

## 2020-05-19 ENCOUNTER — Other Ambulatory Visit: Payer: Self-pay

## 2020-05-19 VITALS — Wt 126.1 lb

## 2020-05-19 DIAGNOSIS — J479 Bronchiectasis, uncomplicated: Secondary | ICD-10-CM | POA: Diagnosis not present

## 2020-05-19 NOTE — Progress Notes (Addendum)
Chelsea Garcia 87 y.o. female Nutrition Note  Diagnosis: Bronchiestasis  Past Medical History:  Diagnosis Date  . Colon polyps    Benign. 2 colonoscopies in past. Due now again.  . Complication of anesthesia    "got dizzy in 70's"  . Endometriosis    required TAH when young  . Essential tremor    since 40's  . GERD (gastroesophageal reflux disease)    denis  . Glaucoma    started in 40's  . Low back pain    since 40's  . Migraine    started in 50's  . Osteopenia    2014  . Seasonal allergies    to Dust, mold and pollen.  . Thrombocytosis September 2015  . Tick bites    she likes out door activities, 2 this year (2015)     Medications reviewed.   Current Outpatient Medications:  .  acetaminophen (TYLENOL) 500 MG tablet, Take 500 mg by mouth every 6 (six) hours as needed for moderate pain., Disp: , Rfl:  .  brimonidine (ALPHAGAN) 0.15 % ophthalmic solution, Place 1 drop into both eyes 2 (two) times daily., Disp: , Rfl:  .  calcium carbonate (OS-CAL) 1250 (500 Ca) MG chewable tablet, Chew 1 tablet by mouth daily., Disp: , Rfl:  .  dicyclomine (BENTYL) 20 MG tablet, Take 1 tablet (20 mg total) by mouth 2 (two) times daily., Disp: 20 tablet, Rfl: 0 .  Fluticasone-Salmeterol (ADVAIR DISKUS) 100-50 MCG/DOSE AEPB, Inhale 1 puff into the lungs in the morning and at bedtime. (Patient not taking: No sig reported), Disp: 60 each, Rfl: 12 .  fluticasone-salmeterol (ADVAIR) 100-50 MCG/ACT AEPB, Inhale 1 puff into the lungs 2 (two) times daily., Disp: , Rfl:  .  HYDROcodone-acetaminophen (NORCO/VICODIN) 5-325 MG tablet, Take 2 tablets by mouth every 12 (twelve) hours as needed for severe pain (severe pain not better with tylenol/ibuprofen)., Disp: 6 tablet, Rfl: 0 .  Latanoprostene Bunod (VYZULTA) 0.024 % SOLN, Place 1 drop into both eyes daily., Disp: , Rfl:  .  Multiple Vitamin (MULTI VITAMIN) TABS, Take 1 tablet by mouth 2 (two) times daily., Disp: , Rfl:  .  Netarsudil Dimesylate 0.02  % SOLN, Place 1 drop into both eyes daily., Disp: , Rfl:    Ht Readings from Last 1 Encounters:  05/06/20 4\' 11"  (1.499 m)     Wt Readings from Last 3 Encounters:  05/06/20 115 lb 1.6 oz (52.2 kg)  05/12/20 115 lb 11.9 oz (52.5 kg)  04/28/20 115 lb 8.3 oz (52.4 kg)     There is no height or weight on file to calculate BMI.   Social History   Tobacco Use  Smoking Status Former Smoker  . Packs/day: 1.00  . Years: 26.00  . Pack years: 26.00  . Types: Cigarettes  . Quit date: 07/01/1961  . Years since quitting: 58.9  Smokeless Tobacco Never Used      Nutrition Note  Spoke with pt. Nutrition Plan and Nutrition Survey goals reviewed with pt.  Pt states no nutrition concerns. She is happy with her diet. Her daughter in law cooks most of her meals and tries to [employer].She has not had any recent weight loss or weight gain.Her appetite is fine. She eats 3 balanced meals and 2 snacks per day.  She takes a MVI. She avoids greasy foods because it gives her diarrhea.   Offered RD support if pt has any nutrition concerns during pulmonary rehab.  Pt expressed understanding of the information  reviewed.    Nutrition Diagnosis ? Not ready for lifestyle change related to denial of need to change as evidenced by pt report   Nutrition Intervention ? Pt's individual nutrition plan reviewed with pt. ? Benefits of adopting healthy diet reviewed with Picture My Plate survey   ? Continue client-centered nutrition education by RD, as part of interdisciplinary care.  Goal(s) ? Pt to maintain weight  ? Pt to build a healthy plate including vegetables, fruits, whole grains, and low-fat dairy products in a heart healthy meal plan.  Plan:   Will provide client-centered nutrition education as part of interdisciplinary care  Monitor and evaluate progress toward nutrition goal with team.   Michaele Offer, MS, RDN, LDN

## 2020-05-19 NOTE — Progress Notes (Signed)
Daily Session Note  Patient Details  Name: Chelsea Garcia MRN: [id number] Date of Birth: Feb 22, 1933 Referring Provider:   April Manson Pulmonary Rehab Walk Test from 04/13/2020 in [facility]  Referring Provider Dr. Loanne Drilling      Encounter Date: 05/19/2020  Check In:  Session Check In - 05/19/20 1441      Check-In   Supervising physician immediately available to respond to emergencies [facility] immediately available    Physician(s) Dr. Arbutus Ped    Location [facility]    Staff Present Rosebud Poles, RN, Isaac Laud, MS, ACSM-CEP, Exercise Physiologist;Annedrea Rosezella [place], RN, MHA;Carlette Wilber Oliphant, RN, Milus Glazier, MS, ACSM-CEP, CCRP, Exercise Physiologist    Virtual Visit No    Medication changes reported     No    Fall or balance concerns reported    No    Tobacco Cessation No Change    Warm-up and Cool-down Performed as group-led instruction    Resistance Training Performed Yes    VAD Patient? No    PAD/SET Patient? No      Pain Assessment   Currently in Pain? No/denies    Pain Score 0-No pain    Multiple Pain Sites No           Capillary Blood Glucose: No results found for this or any previous visit (from the past 24 hour(s)).    Social History   Tobacco Use  Smoking Status Former Smoker  . Packs/day: 1.00  . Years: 26.00  . Pack years: 26.00  . Types: Cigarettes  . Quit date: 07/01/1961  . Years since quitting: 58.9  Smokeless Tobacco Never Used    Goals Met:  Proper associated with RPD/PD & O2 Sat Exercise tolerated well Strength training completed today  Goals Unmet:  Not Applicable  Comments: Service time is from 1315 to 1425    Dr. Fransico Him is Medical Director for Cardiac Rehab at [facility].

## 2020-05-20 DIAGNOSIS — K5733 Diverticulitis of large intestine without perforation or abscess with bleeding: Secondary | ICD-10-CM | POA: Diagnosis not present

## 2020-05-21 ENCOUNTER — Encounter (HOSPITAL_COMMUNITY): Payer: Medicare Other

## 2020-05-21 ENCOUNTER — Telehealth (HOSPITAL_COMMUNITY): Payer: Self-pay | Admitting: Family Medicine

## 2020-05-26 ENCOUNTER — Encounter (HOSPITAL_COMMUNITY)
Admission: RE | Admit: 2020-05-26 | Discharge: 2020-05-26 | Disposition: A | Discharge status: Home / Self Care | Payer: Medicare Other | Source: Ambulatory Visit | Attending: Pulmonary Disease | Admitting: Pulmonary Disease

## 2020-05-27 ENCOUNTER — Telehealth (HOSPITAL_COMMUNITY): Payer: Self-pay | Admitting: Family Medicine

## 2020-05-27 DIAGNOSIS — R197 Diarrhea, unspecified: Secondary | ICD-10-CM | POA: Diagnosis not present

## 2020-05-28 ENCOUNTER — Encounter (HOSPITAL_COMMUNITY): Payer: Medicare Other

## 2020-06-01 ENCOUNTER — Ambulatory Visit: Payer: Medicare Other | Admitting: Podiatry

## 2020-06-02 ENCOUNTER — Telehealth (HOSPITAL_COMMUNITY): Payer: Self-pay | Admitting: *Deleted

## 2020-06-02 ENCOUNTER — Encounter (HOSPITAL_COMMUNITY)
Admission: RE | Admit: 2020-06-02 | Discharge: 2020-06-02 | Disposition: A | Discharge status: Home / Self Care | Payer: Medicare Other | Source: Ambulatory Visit | Attending: Pulmonary Disease | Admitting: Pulmonary Disease

## 2020-06-02 DIAGNOSIS — J479 Bronchiectasis, uncomplicated: Secondary | ICD-10-CM

## 2020-06-02 NOTE — Telephone Encounter (Signed)
Called pt to check on her since her absence from [facility] program. She states that she is still having some difficulty with diverticulitis.  I ask pt if she would like to be dropped from the program at this time since her graduation date is 06/18/20.She agreed that would be best. I encouraged her to call us back when she is better and we will get her back in the program. She plans to call and feels that even though she had only 5 sessions that it had made a difference in her breathing. She has our contact number.

## 2020-06-02 NOTE — Progress Notes (Signed)
Pulmonary Individual Treatment Plan  Patient Details  Name: Chelsea Garcia MRN: [id number] Date of Birth: [birth year] Referring Provider:   April Manson Pulmonary Rehab Walk Test from 04/13/2020 in [facility]  Referring Provider Dr. Loanne Drilling      Initial Encounter Date:  Flowsheet Row Pulmonary Rehab Walk Test from 04/13/2020 in [facility]  Date 04/13/20      Visit Diagnosis: Bronchiectasis without complication ([place])  Patient's Home Medications on Admission:   Current Outpatient Medications:  .  acetaminophen (TYLENOL) 500 MG tablet, Take 500 mg by mouth every 6 (six) hours as needed for moderate pain., Disp: , Rfl:  .  brimonidine (ALPHAGAN) 0.15 % ophthalmic solution, Place 1 drop into both eyes 2 (two) times daily., Disp: , Rfl:  .  calcium carbonate (OS-CAL) 1250 (500 Ca) MG chewable tablet, Chew 1 tablet by mouth daily., Disp: , Rfl:  .  dicyclomine (BENTYL) 20 MG tablet, Take 1 tablet (20 mg total) by mouth 2 (two) times daily., Disp: 20 tablet, Rfl: 0 .  Fluticasone-Salmeterol (ADVAIR DISKUS) 100-50 MCG/DOSE AEPB, Inhale 1 puff into the lungs in the morning and at bedtime. (Patient not taking: No sig reported), Disp: 60 each, Rfl: 12 .  fluticasone-salmeterol (ADVAIR) 100-50 MCG/ACT AEPB, Inhale 1 puff into the lungs 2 (two) times daily., Disp: , Rfl:  .  HYDROcodone-acetaminophen (NORCO/VICODIN) 5-325 MG tablet, Take 2 tablets by mouth every 12 (twelve) hours as needed for severe pain (severe pain not better with tylenol/ibuprofen)., Disp: 6 tablet, Rfl: 0 .  Latanoprostene Bunod (VYZULTA) 0.024 % SOLN, Place 1 drop into both eyes daily., Disp: , Rfl:  .  Multiple Vitamin (MULTI VITAMIN) TABS, Take 1 tablet by mouth 2 (two) times daily., Disp: , Rfl:  .  Netarsudil Dimesylate 0.02 % SOLN, Place 1 drop into both eyes daily., Disp: , Rfl:   Past Medical History: Past Medical History:  Diagnosis Date  . Colon polyps     Benign. 2 colonoscopies in past. Due now again.  . Complication of anesthesia    "got dizzy in 70's"  . Endometriosis    required TAH when young  . Essential tremor    since 40's  . GERD (gastroesophageal reflux disease)    denis  . Glaucoma    started in 40's  . Low back pain    since 40's  . Migraine    started in 50's  . Osteopenia    2014  . Seasonal allergies    to Dust, mold and pollen.  . Thrombocytosis September 2015  . Tick bites    she likes out door activities, 2 this year (2015)    Tobacco Use: Social History   Tobacco Use  Smoking Status Former Smoker  . Packs/day: 1.00  . Years: 26.00  . Pack years: 26.00  . Types: Cigarettes  . Quit date: 07/01/1961  . Years since quitting: 58.9  Smokeless Tobacco Never Used    Labs: Recent Review Flowsheet Data   There is no flowsheet data to display.     Capillary Blood Glucose: No results found for: GLUCAP   Pulmonary Assessment Scores:  Pulmonary Assessment Scores    Row Name 04/13/20 1401         ADL UCSD   ADL Phase Entry     SOB Score total 23           CAT Score   CAT Score 14  mMRC Score   mMRC Score 1           UCSD: Self-administered rating of dyspnea associated with activities of daily living (ADLs) 6-point scale (0 = "not at all" to 5 = "maximal or unable to do because of breathlessness")  Scoring Scores range from 0 to 120.  Minimally important difference is 5 units  CAT: CAT can identify the health impairment of COPD patients and is better correlated with disease progression.  CAT has a scoring range of zero to 40. The CAT score is classified into four groups of low (less than 10), medium (10 - 20), high (21-30) and very high (31-40) based on the impact level of disease on health status. A CAT score over 10 suggests significant symptoms.  A worsening CAT score could be explained by an exacerbation, poor medication adherence, poor inhaler technique, or progression of COPD  or comorbid conditions.  CAT MCID is 2 points  mMRC: mMRC (Modified Medical Research Council) Dyspnea Scale is used to assess the degree of baseline functional disability in patients of respiratory disease due to dyspnea. No minimal important difference is established. A decrease in score of 1 point or greater is considered a positive change.   Pulmonary Function Assessment:  Pulmonary Function Assessment - 04/13/20 0931      Breath   Shortness of Breath Yes;Limiting activity           Exercise Target Goals: Exercise Program Goal: Individual exercise prescription set using results from initial 6 min walk test and THRR while considering  patient's activity barriers and safety.   Exercise Prescription Goal: Initial exercise prescription builds to 30-45 minutes a day of aerobic activity, 2-3 days per week.  Home exercise guidelines will be given to patient during program as part of exercise prescription that the participant will acknowledge.  Activity Barriers & Risk Stratification:  Activity Barriers & Cardiac Risk Stratification - 04/13/20 0929      Activity Barriers & Cardiac Risk Stratification   Activity Barriers Deconditioning;Muscular Weakness;Shortness of Breath   Spinal Scoliosis          6 Minute Walk:  6 Minute Walk    Row Name 04/13/20 1353         6 Minute Walk   Phase Initial     Distance 1075 feet     Walk Time 6 minutes     # of Rest Breaks 0     MPH 2.03     METS 1.76     RPE 13     Perceived Dyspnea  1     VO2 Peak 6.17     Symptoms No     Resting HR 80 bpm     Resting BP 102/50     Resting Oxygen Saturation  96 %     Exercise Oxygen Saturation  during 6 min walk 94 %     Max Ex. HR 101 bpm     Max Ex. BP 122/58     2 Minute Post BP 104/52           Interval HR   1 Minute HR 87     2 Minute HR 94     3 Minute HR 101     4 Minute HR 92     5 Minute HR 95     6 Minute HR 94     2 Minute Post HR 83     Interval Heart Rate? Yes  Interval Oxygen   Interval Oxygen? Yes     Baseline Oxygen Saturation % 96 %     1 Minute Oxygen Saturation % 95 %     1 Minute Liters of Oxygen 0 L     2 Minute Oxygen Saturation % 94 %     2 Minute Liters of Oxygen 0 L     3 Minute Oxygen Saturation % 94 %     3 Minute Liters of Oxygen 0 L     4 Minute Oxygen Saturation % 94 %     4 Minute Liters of Oxygen 0 L     5 Minute Oxygen Saturation % 94 %     5 Minute Liters of Oxygen 0 L     6 Minute Oxygen Saturation % 94 %     6 Minute Liters of Oxygen 0 L     2 Minute Post Oxygen Saturation % 96 %     2 Minute Post Liters of Oxygen 0 L            Oxygen Initial Assessment:  Oxygen Initial Assessment - 04/13/20 0931      Home Oxygen   Home Oxygen Device None    Sleep Oxygen Prescription None    Home Exercise Oxygen Prescription None    Home Resting Oxygen Prescription None      Initial 6 min Walk   Oxygen Used None      Program Oxygen Prescription   Program Oxygen Prescription None      Intervention   Short Term Goals To learn and understand importance of monitoring SPO2 with pulse oximeter and demonstrate accurate use of the pulse oximeter.;To learn and understand importance of maintaining oxygen saturations>88%;To learn and demonstrate proper pursed lip breathing techniques or other breathing techniques.;To learn and demonstrate proper use of respiratory medications    Long  Term Goals Verbalizes importance of monitoring SPO2 with pulse oximeter and return demonstration;Maintenance of O2 saturations>88%;Exhibits proper breathing techniques, such as pursed lip breathing or other method taught during program session;Compliance with respiratory medication;Demonstrates proper use of MDI's           Oxygen Re-Evaluation:  Oxygen Re-Evaluation    Row Name 05/05/20 1222 06/02/20 0806           Program Oxygen Prescription   Program Oxygen Prescription None None             Home Oxygen   Home Oxygen Device None None       Sleep Oxygen Prescription None None      Home Exercise Oxygen Prescription None None      Home Resting Oxygen Prescription None None             Goals/Expected Outcomes   Short Term Goals To learn and understand importance of monitoring SPO2 with pulse oximeter and demonstrate accurate use of the pulse oximeter.;To learn and understand importance of maintaining oxygen saturations>88%;To learn and demonstrate proper pursed lip breathing techniques or other breathing techniques.;To learn and demonstrate proper use of respiratory medications To learn and understand importance of monitoring SPO2 with pulse oximeter and demonstrate accurate use of the pulse oximeter.;To learn and understand importance of maintaining oxygen saturations>88%;To learn and demonstrate proper pursed lip breathing techniques or other breathing techniques.;To learn and demonstrate proper use of respiratory medications      Long  Term Goals Verbalizes importance of monitoring SPO2 with pulse oximeter and return demonstration;Maintenance of O2 saturations>88%;Exhibits proper breathing techniques, such as pursed lip  breathing or other method taught during program session;Compliance with respiratory medication;Demonstrates proper use of MDI's Verbalizes importance of monitoring SPO2 with pulse oximeter and return demonstration;Maintenance of O2 saturations>88%;Exhibits proper breathing techniques, such as pursed lip breathing or other method taught during program session;Compliance with respiratory medication;Demonstrates proper use of MDI's      Goals/Expected Outcomes compliance and understanding of oxygen saturation and importance of pursed lip breathing. compliance and understanding of oxygen saturation and importance of pursed lip breathing.             Oxygen Discharge (Final Oxygen Re-Evaluation):  Oxygen Re-Evaluation - 06/02/20 0806      Program Oxygen Prescription   Program Oxygen Prescription None      Home  Oxygen   Home Oxygen Device None    Sleep Oxygen Prescription None    Home Exercise Oxygen Prescription None    Home Resting Oxygen Prescription None      Goals/Expected Outcomes   Short Term Goals To learn and understand importance of monitoring SPO2 with pulse oximeter and demonstrate accurate use of the pulse oximeter.;To learn and understand importance of maintaining oxygen saturations>88%;To learn and demonstrate proper pursed lip breathing techniques or other breathing techniques.;To learn and demonstrate proper use of respiratory medications    Long  Term Goals Verbalizes importance of monitoring SPO2 with pulse oximeter and return demonstration;Maintenance of O2 saturations>88%;Exhibits proper breathing techniques, such as pursed lip breathing or other method taught during program session;Compliance with respiratory medication;Demonstrates proper use of MDI's    Goals/Expected Outcomes compliance and understanding of oxygen saturation and importance of pursed lip breathing.           Initial Exercise Prescription:  Initial Exercise Prescription - 04/13/20 1400      Date of Initial Exercise RX and Referring Provider   Date 04/13/20    Referring Provider Dr. Loanne Drilling    Expected Discharge Date 06/18/20      NuStep   Level 1    SPM 75    Minutes 15      Track   Minutes 15      Prescription Details   Frequency (times per week) 2    Duration Progress to 30 minutes of continuous aerobic without signs/symptoms of physical distress      Intensity   THRR 40-80% of Max Heartrate 54-109    Ratings of Perceived Exertion 11-13    Perceived Dyspnea 0-4      Progression   Progression Continue to progress workloads to maintain intensity without signs/symptoms of physical distress.      Resistance Training   Training Prescription Yes    Weight orange bands    Reps 10-15           Perform Capillary Blood Glucose checks as needed.  Exercise Prescription Changes:  Exercise  Prescription Changes    Row Name 04/28/20 1500 05/12/20 1500 05/19/20 1500         Response to Exercise   Blood Pressure (Admit) 132/84 126/60 124/68     Blood Pressure (Exercise) 114/60 -- --     Blood Pressure (Exit) 126/60 118/64 106/62     Heart Rate (Admit) 69 bpm 71 bpm 79 bpm     Heart Rate (Exercise) 81 bpm 82 bpm 89 bpm     Heart Rate (Exit) 2.1 bpm 81 bpm 78 bpm     Oxygen Saturation (Admit) 98 % 96 % 97 %     Oxygen Saturation (Exercise) 97 % 95 % 97 %  Oxygen Saturation (Exit) 96 % 95 % 97 %     Rating of Perceived Exertion (Exercise) 11 11 10      Perceived Dyspnea (Exercise) 1 1 2      Duration Continue with 30 min of aerobic exercise without signs/symptoms of physical distress. Continue with 30 min of aerobic exercise without signs/symptoms of physical distress. Continue with 30 min of aerobic exercise without signs/symptoms of physical distress.     Intensity --  40-80% HRR THRR unchanged THRR unchanged           Progression   Progression Continue to progress workloads to maintain intensity without signs/symptoms of physical distress. Continue to progress workloads to maintain intensity without signs/symptoms of physical distress. Continue to progress workloads to maintain intensity without signs/symptoms of physical distress.           Resistance Training   Training Prescription Yes Yes Yes     Weight orange bands orange bands orange bands     Reps 10-15 10-15 10-15     Time 10 Minutes 10 Minutes 10 Minutes           NuStep   Level 1 1 2      SPM 80 80 80     Minutes 15 15 15      METs 1.3 1.7 1.5           Track   Laps 15 13 12      Minutes 15 15 15             Exercise Comments:  Exercise Comments    Row Name 04/21/20 1543           Exercise Comments Pt completed first exercise session and tolerated well with no complaints or concerns. She was able to do 30 minutes of consistent cardiovascular exercise.She also did all of the resistance training  exercises and stretches with no complaints.              Exercise Goals and Review:  Exercise Goals    Row Name 04/13/20 1036             Exercise Goals   Increase Physical Activity Yes       Intervention Provide advice, education, support and counseling about physical activity/exercise needs.;Develop an individualized exercise prescription for aerobic and resistive training based on initial evaluation findings, risk stratification, comorbidities and participant's personal goals.       Expected Outcomes Short Term: Attend rehab on a regular basis to increase amount of physical activity.;Long Term: Add in home exercise to make exercise part of routine and to increase amount of physical activity.;Long Term: Exercising regularly at least 3-5 days a week.       Increase Strength and Stamina Yes       Intervention Provide advice, education, support and counseling about physical activity/exercise needs.;Develop an individualized exercise prescription for aerobic and resistive training based on initial evaluation findings, risk stratification, comorbidities and participant's personal goals.       Expected Outcomes Short Term: Increase workloads from initial exercise prescription for resistance, speed, and METs.;Short Term: Perform resistance training exercises routinely during rehab and add in resistance training at home;Long Term: Improve cardiorespiratory fitness, muscular endurance and strength as measured by increased METs and functional capacity (6MWT)       Able to understand and use rate of perceived exertion (RPE) scale Yes       Intervention Provide education and explanation on how to use RPE scale  Expected Outcomes Short Term: Able to use RPE daily in rehab to express subjective intensity level;Long Term:  Able to use RPE to guide intensity level when exercising independently       Able to understand and use Dyspnea scale Yes       Intervention Provide education and explanation on  how to use Dyspnea scale       Expected Outcomes Short Term: Able to use Dyspnea scale daily in rehab to express subjective sense of shortness of breath during exertion;Long Term: Able to use Dyspnea scale to guide intensity level when exercising independently       Knowledge and understanding of Target Heart Rate Range (THRR) Yes       Intervention Provide education and explanation of THRR including how the numbers were predicted and where they are located for reference       Expected Outcomes Short Term: Able to state/look up THRR;Long Term: Able to use THRR to govern intensity when exercising independently;Short Term: Able to use daily as guideline for intensity in rehab       Understanding of Exercise Prescription Yes       Intervention Provide education, explanation, and written materials on patient's individual exercise prescription       Expected Outcomes Short Term: Able to explain program exercise prescription;Long Term: Able to explain home exercise prescription to exercise independently              Exercise Goals Re-Evaluation :  Exercise Goals Re-Evaluation    Row Name 05/05/20 1220 06/02/20 0804           Exercise Goal Re-Evaluation   Exercise Goals Review Increase Physical Activity;Increase Strength and Stamina;Able to understand and use rate of perceived exertion (RPE) scale;Able to understand and use Dyspnea scale;Knowledge and understanding of Target Heart Rate Range (THRR);Understanding of Exercise Prescription Increase Physical Activity;Increase Strength and Stamina;Able to understand and use rate of perceived exertion (RPE) scale;Able to understand and use Dyspnea scale;Knowledge and understanding of Target Heart Rate Range (THRR);Understanding of Exercise Prescription      Comments Pt has completed 2 exercise and tolerated well so far. It is too early to note any progression, but we will continue to monitor and progress as she is able. Pt has only completed 5 exercise  sessions. She has been out for a couple of weeks due to GI problems. Pt was tolerating exercise well when she first started. Last session was 05/19/20 and she was exercising at 1.5 METS on the Nustep and 2.39 METS walking the track. Will continue to monitor and follow up with pt.      Expected Outcomes Through exercise at rehab and home, the patient will decrease shortness of breath with daily activities and feel confident in carrying out an exercise regimn at home. Through exercise at rehab and home, the patient will decrease shortness of breath with daily activities and feel confident in carrying out an exercise regimn at home.             Discharge Exercise Prescription (Final Exercise Prescription Changes):  Exercise Prescription Changes - 05/19/20 1500      Response to Exercise   Blood Pressure (Admit) 124/68    Blood Pressure (Exit) 106/62    Heart Rate (Admit) 79 bpm    Heart Rate (Exercise) 89 bpm    Heart Rate (Exit) 78 bpm    Oxygen Saturation (Admit) 97 %    Oxygen Saturation (Exercise) 97 %    Oxygen Saturation (Exit) 97 %  Rating of Perceived Exertion (Exercise) 10    Perceived Dyspnea (Exercise) 2    Duration Continue with 30 min of aerobic exercise without signs/symptoms of physical distress.    Intensity THRR unchanged      Progression   Progression Continue to progress workloads to maintain intensity without signs/symptoms of physical distress.      Resistance Training   Training Prescription Yes    Weight orange bands    Reps 10-15    Time 10 Minutes      NuStep   Level 2    SPM 80    Minutes 15    METs 1.5      Track   Laps 12    Minutes 15           Nutrition:  Target Goals: Understanding of nutrition guidelines, daily intake of sodium <1576m, cholesterol <2053m calories 30% from fat and 7% or less from saturated fats, daily to have 5 or more servings of fruits and vegetables.  Biometrics:  Pre Biometrics - 04/13/20 1348      Pre Biometrics    Grip Strength 19 kg            Nutrition Therapy Plan and Nutrition Goals:  Nutrition Therapy & Goals - 05/19/20 1433      Nutrition Therapy   Diet Generally Healthful      Personal Nutrition Goals   Nutrition Goal Pt to maintain weight    Personal Goal #2 Pt to build a healthy plate including vegetables, fruits, whole grains, and low-fat dairy products in a heart healthy meal plan.      Intervention Plan   Intervention Prescribe, educate and counsel regarding individualized specific dietary modifications aiming towards targeted core components such as weight, hypertension, lipid management, diabetes, heart failure and other comorbidities.    Expected Outcomes Short Term Goal: Understand basic principles of dietary content, such as calories, fat, sodium, cholesterol and nutrients.           Nutrition Assessments:  MEDIFICTS Score Key:  ?70 Need to make dietary changes   40-70 Heart Healthy Diet  ? 40 Therapeutic Level Cholesterol Diet  Flowsheet Row PULMONARY REHAB OTHER RESPIRATORY [facility] on [facility]     Picture Your Plate Scores:  <4<02nhealthy dietary pattern with much room for improvement.  41-50 Dietary pattern unlikely to meet recommendations for good health and room for improvement.  51-60 More healthful dietary pattern, with some room for improvement.   >60 Healthy dietary pattern, although there may be some specific behaviors that could be improved.    Nutrition Goals Re-Evaluation:  Nutrition Goals Re-Evaluation    [place] 05/19/20 1451             Goals   Current Weight 115 lb (52.2 kg)       Nutrition Goal Pt to maintain weight               Personal Goal #2 Re-Evaluation   Personal Goal #2 Pt to build a healthy plate including vegetables, fruits, whole grains, and low-fat dairy products in a heart healthy meal plan.              Nutrition  Goals Discharge (Final Nutrition Goals Re-Evaluation):  Nutrition Goals Re-Evaluation - 05/19/20 1451      Goals   Current Weight 115 lb (52.2 kg)    Nutrition Goal Pt to maintain weight  Personal Goal #2 Re-Evaluation   Personal Goal #2 Pt to build a healthy plate including vegetables, fruits, whole grains, and low-fat dairy products in a heart healthy meal plan.           Psychosocial: Target Goals: Acknowledge presence or absence of significant depression and/or stress, maximize coping skills, provide positive support system. Participant is able to verbalize types and ability to use techniques and skills needed for reducing stress and depression.  Initial Review & Psychosocial Screening:  Initial Psych Review & Screening - 04/13/20 1349      Screening Interventions   Expected Outcomes Long Term Goal: Stressors or current issues are controlled or eliminated.;Long Term goal: The participant improves quality of Life and PHQ9 Scores as seen by post scores and/or verbalization of changes           Quality of Life Scores:  Scores of 19 and below usually indicate a poorer quality of life in these areas.  A difference of  2-3 points is a clinically meaningful difference.  A difference of 2-3 points in the total score of the Quality of Life Index has been associated with significant improvement in overall quality of life, self-image, physical symptoms, and general health in studies assessing change in quality of life.  PHQ-9: Recent Review Flowsheet Data    Depression screen [facility] 2/9 04/13/2020   Decreased Interest 0   Down, Depressed, Hopeless 0   PHQ - 2 Score 0   Altered sleeping 3   Tired, decreased energy 3   Change in appetite 0   Feeling bad or failure about yourself  0   Trouble concentrating 2   Suicidal thoughts 0   Difficult doing work/chores Not difficult at all     Interpretation of Total Score  Total Score Depression Severity:  1-4 = Minimal depression, 5-9 =  Mild depression, 10-14 = Moderate depression, 15-19 = Moderately severe depression, 20-27 = Severe depression   Psychosocial Evaluation and Intervention:  Psychosocial Evaluation - 04/13/20 1031      Psychosocial Evaluation & Interventions   Interventions Encouraged to exercise with the program and follow exercise prescription    Comments Pt and husband live with their son, which has decreased a lot of stress and made home management a lot easier. Pt denies any history or current depression. Pt seems to be in a good state of mind.    Expected Outcomes Improve quality of life and decrease shortness of breath with daily activities.    Continue Psychosocial Services  Follow up required by staff           Psychosocial Re-Evaluation:  Psychosocial Re-Evaluation    Row Name 04/30/20 0903 05/25/20 1421           Psychosocial Re-Evaluation   Current issues with None Identified None Identified      Comments No psychosocial concerns identified at this time. No concerns identified at this time.      Expected Outcomes For "Jeani Hawking" to continue to have no psychosocial concerns while participating in pulmonary rehab. For Jeani Hawking to continue to be free of psychosocial concerns while participating in pulmonary rehab.      Interventions Encouraged to attend Pulmonary Rehabilitation for the exercise Encouraged to attend Pulmonary Rehabilitation for the exercise      Continue Psychosocial Services  No Follow up required No Follow up required             Psychosocial Discharge (Final Psychosocial Re-Evaluation):  Psychosocial Re-Evaluation - 05/25/20 1421  Psychosocial Re-Evaluation   Current issues with None Identified    Comments No concerns identified at this time.    Expected Outcomes For Jeani Hawking to continue to be free of psychosocial concerns while participating in pulmonary rehab.    Interventions Encouraged to attend Pulmonary Rehabilitation for the exercise    Continue Psychosocial Services   No Follow up required           Education: Education Goals: Education classes will be provided on a weekly basis, covering required topics. Participant will state understanding/return demonstration of topics presented.  Learning Barriers/Preferences:  Learning Barriers/Preferences - 04/13/20 0934      Learning Barriers/Preferences   Learning Barriers Sight   Has glaucoma   Learning Preferences Skilled Demonstration;Written Material;Individual Instruction           Education Topics: Risk Factor Reduction:  -Group instruction that is supported by a PowerPoint presentation. Instructor discusses the definition of a risk factor, different risk factors for pulmonary disease, and how the heart and lungs work together.     Nutrition for Pulmonary Patient:  -Group instruction provided by PowerPoint slides, verbal discussion, and written materials to support subject matter. The instructor gives an explanation and review of healthy diet recommendations, which includes a discussion on weight management, recommendations for fruit and vegetable consumption, as well as protein, fluid, caffeine, fiber, sodium, sugar, and alcohol. Tips for eating when patients are short of breath are discussed.   Pursed Lip Breathing:  -Group instruction that is supported by demonstration and informational handouts. Instructor discusses the benefits of pursed lip and diaphragmatic breathing and detailed demonstration on how to preform both.     Oxygen Safety:  -Group instruction provided by PowerPoint, verbal discussion, and written material to support subject matter. There is an overview of "What is Oxygen" and "Why do we need it".  Instructor also reviews how to create a safe environment for oxygen use, the importance of using oxygen as prescribed, and the risks of noncompliance. There is a brief discussion on traveling with oxygen and resources the patient may utilize.   Oxygen Equipment:  -Group instruction  provided by [facility] Staff utilizing handouts, written materials, and equipment demonstrations.   Signs and Symptoms:  -Group instruction provided by written material and verbal discussion to support subject matter. Warning signs and symptoms of infection, stroke, and heart attack are reviewed and when to call the physician/911 reinforced. Tips for preventing the spread of infection discussed.   Advanced Directives:  -Group instruction provided by verbal instruction and written material to support subject matter. Instructor reviews Advanced Directive laws and proper instruction for filling out document.   Pulmonary Video:  -Group video education that reviews the importance of medication and oxygen compliance, exercise, good nutrition, pulmonary hygiene, and pursed lip and diaphragmatic breathing for the pulmonary patient.   Exercise for the Pulmonary Patient:  -Group instruction that is supported by a PowerPoint presentation. Instructor discusses benefits of exercise, core components of exercise, frequency, duration, and intensity of an exercise routine, importance of utilizing pulse oximetry during exercise, safety while exercising, and options of places to exercise outside of rehab.     Pulmonary Medications:  -Verbally interactive group education provided by instructor with focus on inhaled medications and proper administration.   Anatomy and Physiology of the Respiratory System and Intimacy:  -Group instruction provided by PowerPoint, verbal discussion, and written material to support subject matter. Instructor reviews respiratory cycle and anatomical components of the respiratory system and their functions. Instructor also  reviews differences in obstructive and restrictive respiratory diseases with examples of each. Intimacy, Sex, and Sexuality differences are reviewed with a discussion on how relationships can change when diagnosed with pulmonary disease. Common sexual concerns are  reviewed.   MD DAY -A group question and answer session with a medical doctor that allows participants to ask questions that relate to their pulmonary disease state.   OTHER EDUCATION -Group or individual verbal, written, or video instructions that support the educational goals of the pulmonary rehab program.   Holiday Eating Survival Tips:  -Group instruction provided by PowerPoint slides, verbal discussion, and written materials to support subject matter. The instructor gives patients tips, tricks, and techniques to help them not only survive but enjoy the holidays despite the onslaught of food that accompanies the holidays.   Knowledge Questionnaire Score:  Knowledge Questionnaire Score - 04/13/20 1408      Knowledge Questionnaire Score   Pre Score 14/18           Core Components/Risk Factors/Patient Goals at Admission:  Personal Goals and Risk Factors at Admission - 04/13/20 1349      Core Components/Risk Factors/Patient Goals on Admission    Weight Management Weight Maintenance           Core Components/Risk Factors/Patient Goals Review:   Goals and Risk Factor Review    Row Name 04/30/20 0904 05/25/20 1422           Core Components/Risk Factors/Patient Goals Review   Personal Goals Review Develop more efficient breathing techniques such as purse lipped breathing and diaphragmatic breathing and practicing self-pacing with activity.;Increase knowledge of respiratory medications and ability to use respiratory devices properly.;Improve shortness of breath with ADL's Develop more efficient breathing techniques such as purse lipped breathing and diaphragmatic breathing and practicing self-pacing with activity.;Increase knowledge of respiratory medications and ability to use respiratory devices properly.;Improve shortness of breath with ADL's      Review "Jeani Hawking" just started pulmonary rehab, has attended 3 exercise sessions, too early to see progression towards program  goals. Jeani Hawking has completed 5 exercise sessions, she has been absent d/t stomach pain and diverticululitis.  Her progression has been slow d/t her absences.  Continue to progress as tolerated on equipment.      Expected Outcomes See admission goals. See admission goals.             Core Components/Risk Factors/Patient Goals at Discharge (Final Review):   Goals and Risk Factor Review - 05/25/20 1422      Core Components/Risk Factors/Patient Goals Review   Personal Goals Review Develop more efficient breathing techniques such as purse lipped breathing and diaphragmatic breathing and practicing self-pacing with activity.;Increase knowledge of respiratory medications and ability to use respiratory devices properly.;Improve shortness of breath with ADL's    Review Jeani Hawking has completed 5 exercise sessions, she has been absent d/t stomach pain and diverticululitis.  Her progression has been slow d/t her absences.  Continue to progress as tolerated on equipment.    Expected Outcomes See admission goals.           ITP Comments:   Comments:  "Jeani Hawking" has completed  5 exercise session in Pulmonary rehab. Pt has poor attendance due to medical issues.  Pt also has been unable to engage in home exercise consistently.  Pulmonary rehab staff will continue to monitor and reassess progress toward goals during her participation in Pulmonary Rehab. Cherre Huger, BSN Cardiac and Training and development officer

## 2020-06-04 ENCOUNTER — Encounter (HOSPITAL_COMMUNITY): Payer: Medicare Other

## 2020-06-04 DIAGNOSIS — M40209 Unspecified kyphosis, site unspecified: Secondary | ICD-10-CM | POA: Diagnosis not present

## 2020-06-04 DIAGNOSIS — M419 Scoliosis, unspecified: Secondary | ICD-10-CM | POA: Diagnosis not present

## 2020-06-05 DIAGNOSIS — Z23 Encounter for immunization: Secondary | ICD-10-CM | POA: Diagnosis not present

## 2020-06-09 ENCOUNTER — Encounter (HOSPITAL_COMMUNITY): Payer: Medicare Other

## 2020-06-11 ENCOUNTER — Encounter (HOSPITAL_COMMUNITY): Payer: Medicare Other

## 2020-06-16 ENCOUNTER — Encounter (HOSPITAL_COMMUNITY): Payer: Medicare Other

## 2020-06-18 ENCOUNTER — Encounter (HOSPITAL_COMMUNITY): Payer: Medicare Other

## 2020-06-23 DIAGNOSIS — H401133 Primary open-angle glaucoma, bilateral, severe stage: Secondary | ICD-10-CM | POA: Diagnosis not present

## 2020-06-26 ENCOUNTER — Emergency Department (HOSPITAL_COMMUNITY)
Admission: EM | Admit: 2020-06-26 | Discharge: 2020-06-27 | Disposition: A | Discharge status: Home / Self Care | Payer: Medicare Other | Attending: Emergency Medicine | Admitting: Emergency Medicine

## 2020-06-26 ENCOUNTER — Emergency Department (HOSPITAL_COMMUNITY): Payer: Medicare Other

## 2020-06-26 ENCOUNTER — Other Ambulatory Visit: Payer: Self-pay

## 2020-06-26 ENCOUNTER — Encounter (HOSPITAL_COMMUNITY): Payer: Self-pay | Admitting: Emergency Medicine

## 2020-06-26 DIAGNOSIS — Z87891 Personal history of nicotine dependence: Secondary | ICD-10-CM | POA: Insufficient documentation

## 2020-06-26 DIAGNOSIS — R197 Diarrhea, unspecified: Secondary | ICD-10-CM | POA: Insufficient documentation

## 2020-06-26 DIAGNOSIS — R1909 Other intra-abdominal and pelvic swelling, mass and lump: Secondary | ICD-10-CM | POA: Diagnosis not present

## 2020-06-26 DIAGNOSIS — K219 Gastro-esophageal reflux disease without esophagitis: Secondary | ICD-10-CM | POA: Diagnosis not present

## 2020-06-26 DIAGNOSIS — R1031 Right lower quadrant pain: Secondary | ICD-10-CM | POA: Diagnosis not present

## 2020-06-26 DIAGNOSIS — R109 Unspecified abdominal pain: Secondary | ICD-10-CM | POA: Diagnosis not present

## 2020-06-26 DIAGNOSIS — D72829 Elevated white blood cell count, unspecified: Secondary | ICD-10-CM | POA: Diagnosis not present

## 2020-06-26 LAB — CBC
HCT: 39.9 % (ref 36.0–46.0)
Hemoglobin: 12.3 g/dL (ref 12.0–15.0)
MCH: 27.8 pg (ref 26.0–34.0)
MCHC: 30.8 g/dL (ref 30.0–36.0)
MCV: 90.3 fL (ref 80.0–100.0)
Platelets: 391 10*3/uL (ref 150–400)
RBC: 4.42 MIL/uL (ref 3.87–5.11)
RDW: 18.2 % — ABNORMAL HIGH (ref 11.5–15.5)
WBC: 11.5 10*3/uL — ABNORMAL HIGH (ref 4.0–10.5)
nRBC: 0 % (ref 0.0–0.2)

## 2020-06-26 LAB — URINALYSIS, ROUTINE W REFLEX MICROSCOPIC
Bilirubin Urine: NEGATIVE
Glucose, UA: NEGATIVE mg/dL
Hgb urine dipstick: NEGATIVE
Ketones, ur: NEGATIVE mg/dL
Leukocytes,Ua: NEGATIVE
Nitrite: NEGATIVE
Protein, ur: NEGATIVE mg/dL
Specific Gravity, Urine: 1.006 (ref 1.005–1.030)
pH: 5 (ref 5.0–8.0)

## 2020-06-26 LAB — LIPASE, BLOOD: Lipase: 44 U/L (ref 11–51)

## 2020-06-26 LAB — COMPREHENSIVE METABOLIC PANEL
ALT: 11 U/L (ref 0–44)
AST: 18 U/L (ref 15–41)
Albumin: 4.7 g/dL (ref 3.5–5.0)
Alkaline Phosphatase: 44 U/L (ref 38–126)
Anion gap: 8 (ref 5–15)
BUN: 25 mg/dL — ABNORMAL HIGH (ref 8–23)
CO2: 26 mmol/L (ref 22–32)
Calcium: 9.4 mg/dL (ref 8.9–10.3)
Chloride: 106 mmol/L (ref 98–111)
Creatinine, Ser: 1.16 mg/dL — ABNORMAL HIGH (ref 0.44–1.00)
GFR, Estimated: 46 mL/min — ABNORMAL LOW (ref >60)
Glucose, Bld: 105 mg/dL — ABNORMAL HIGH (ref 70–99)
Potassium: 4.2 mmol/L (ref 3.5–5.1)
Sodium: 140 mmol/L (ref 135–145)
Total Bilirubin: 0.5 mg/dL (ref 0.3–1.2)
Total Protein: 7.1 g/dL (ref 6.5–8.1)

## 2020-06-26 MED ORDER — SODIUM CHLORIDE (PF) 0.9 % IJ SOLN
INTRAMUSCULAR | Status: AC
  Filled 2020-06-26: qty 50

## 2020-06-26 MED ORDER — IOHEXOL 300 MG/ML  SOLN
100.0000 mL | Freq: Once | INTRAMUSCULAR | Status: AC | PRN
  Administered 2020-06-26: 75 mL via INTRAVENOUS

## 2020-06-26 NOTE — ED Triage Notes (Signed)
Patient here from home reporting she was seen today for right sided abd pain and diarrhea at [place]. Suspects stool burden CT scan suggested.

## 2020-06-26 NOTE — ED Provider Notes (Signed)
[place] DEPT Provider Note   CSN: [id number] Arrival date & time: 06/26/20  2103     History Chief Complaint  Patient presents with   Abdominal Pain   Diarrhea    Chelsea Garcia is a 87 y.o. female who presents with concern for a few days of right lower abdominal pain and episode of diarrhea.  She also endorses episode of diarrhea today, but denies any melena hematochezia.  She has not had pain like this in the past.  She was seen by her primary care doctor, [facility] physicians, with abnormal abdominal series.  Suspected high stool burden with overflow diarrhea, however referred to the emergency department for reevaluation with CT scan.  Last normal bowel movement was yesterday. She denies any urinary symptoms.  I personally reviewed this patient's medical records.  She has history of glaucoma, osteopenia, colonic polyps, and GERD.  HPI     Past Medical History:  Diagnosis Date   Colon polyps    Benign. 2 colonoscopies in past. Due now again.   Complication of anesthesia    "got dizzy in 70's"   Endometriosis    required TAH when young   Essential tremor    since 40's   GERD (gastroesophageal reflux disease)    denis   Glaucoma    started in 40's   Low back pain    since 40's   Migraine    started in 50's   Osteopenia    2014   Seasonal allergies    to Dust, mold and pollen.   Thrombocytosis September 2015   Tick bites    she likes out door activities, 2 this year (2015)    Patient Active Problem List   Diagnosis Date Noted   Bronchiectasis without complication ([place]) 95/18/8416   Essential thrombocytosis ([place]) 10/01/2013    Past Surgical History:  Procedure Laterality Date   CATARACT EXTRACTION Left 02/07/2013   INGUINAL HERNIA REPAIR Left 01/09/2020   Procedure: LEFT INGUINAL HERNIA REPAIR WITH MESH;  Surgeon: Erroll Luna, MD;  Location: [place];  Service: General;  Laterality: Left;   INSERTION OF MESH  Left 01/09/2020   Procedure: INSERTION OF MESH;  Surgeon: Erroll Luna, MD;  Location: [place];  Service: General;  Laterality: Left;   TOTAL ABDOMINAL HYSTERECTOMY  1970's   TUBAL LIGATION       OB History   No obstetric history on file.     Family History  Problem Relation Age of Onset   Thyroid disease Mother    Cancer Father    Cancer Maternal Aunt 80    Social History   Tobacco Use   Smoking status: Former    Packs/day: 1.00    Years: 26.00    Pack years: 26.00    Types: Cigarettes    Quit date: 07/01/1961    Years since quitting: 59.0   Smokeless tobacco: Never  Substance Use Topics   Alcohol use: Yes    Comment: very rare   Drug use: No    Home Medications Prior to Admission medications   Medication Sig Start Date End Date Taking? Authorizing Provider  acetaminophen (TYLENOL) 500 MG tablet Take 500 mg by mouth every 6 (six) hours as needed for moderate pain.    [provider]  brimonidine (ALPHAGAN) 0.15 % ophthalmic solution Place 1 drop into both eyes 2 (two) times daily. 08/16/19   [provider]  calcium carbonate (OS-CAL) 1250 (500 Ca) MG chewable tablet  Chew 1 tablet by mouth daily.    [provider]  dicyclomine (BENTYL) 20 MG tablet Take 1 tablet (20 mg total) by mouth 2 (two) times daily. 05/08/20   Varney Biles, MD  Fluticasone-Salmeterol (ADVAIR DISKUS) 100-50 MCG/DOSE AEPB Inhale 1 puff into the lungs in the morning and at bedtime. Patient not taking: No sig reported 10/23/19   Margaretha Seeds, MD  fluticasone-salmeterol (ADVAIR) 100-50 MCG/ACT AEPB Inhale 1 puff into the lungs 2 (two) times daily.    [provider]  HYDROcodone-acetaminophen (NORCO/VICODIN) 5-325 MG tablet Take 2 tablets by mouth every 12 (twelve) hours as needed for severe pain (severe pain not better with tylenol/ibuprofen). 05/08/20   Varney Biles, MD  Latanoprostene Bunod (VYZULTA) 0.024 % SOLN Place 1 drop into both  eyes daily.    [provider]  Multiple Vitamin (MULTI VITAMIN) TABS Take 1 tablet by mouth 2 (two) times daily.    [provider]  Netarsudil Dimesylate 0.02 % SOLN Place 1 drop into both eyes daily. 10/12/17   [provider]    Allergies    Nystatin, Bee pollen, Pollen extract, Albuterol, Mangifera indica fruit ext (mango) [mangifera indica], Mite (d. farinae), and Sulfamethoxazole  Review of Systems   Review of Systems  Constitutional:  Positive for appetite change. Negative for activity change, chills, diaphoresis, fatigue and fever.  HENT: Negative.    Respiratory: Negative.    Cardiovascular: Negative.   Gastrointestinal:  Positive for abdominal pain and diarrhea. Negative for anal bleeding, blood in stool, constipation, nausea, rectal pain and vomiting.  Genitourinary: Negative.   Musculoskeletal: Negative.   Skin: Negative.   Neurological: Negative.   Hematological: Negative.    Physical Exam Updated Vital Signs BP (!) 147/62   Pulse 84   Temp 98.3 F (36.8 C)   Resp 18   SpO2 96%   Physical Exam Vitals and nursing note reviewed.  Constitutional:      Appearance: She is normal weight. She is not ill-appearing or toxic-appearing.  HENT:     Head: Normocephalic and atraumatic.     Nose: Nose normal.     Mouth/Throat:     Mouth: Mucous membranes are moist.     Pharynx: Oropharynx is clear. Uvula midline. No oropharyngeal exudate or posterior oropharyngeal erythema.  Eyes:     General: Lids are normal. Vision grossly intact.        Right eye: No discharge.        Left eye: No discharge.     Extraocular Movements: Extraocular movements intact.     Conjunctiva/sclera: Conjunctivae normal.     Pupils: Pupils are equal, round, and reactive to light.  Neck:     Trachea: Trachea and phonation normal.  Cardiovascular:     Rate and Rhythm: Normal rate and regular rhythm.     Pulses: Normal pulses.     Heart sounds: Normal heart sounds. No  murmur heard. Pulmonary:     Effort: Pulmonary effort is normal. No tachypnea, bradypnea, accessory muscle usage, prolonged expiration or respiratory distress.     Breath sounds: Normal breath sounds. No wheezing or rales.  Chest:     Chest wall: No mass, lacerations, deformity, swelling, tenderness, crepitus or edema.  Abdominal:     General: Abdomen is protuberant. Bowel sounds are normal. There is no distension.     Palpations: Abdomen is soft.     Tenderness: There is abdominal tenderness in the right lower quadrant, periumbilical area and suprapubic area. There is  no right CVA tenderness or left CVA tenderness.     Comments: Anteriorly tilted pelvis secondary to scoliosis,  resulting protuberant abdomen.   Musculoskeletal:        General: No deformity.     Cervical back: Normal range of motion and neck supple. No edema, rigidity or crepitus. No pain with movement, spinous process tenderness or muscular tenderness.     Right lower leg: No edema.     Left lower leg: No edema.  Lymphadenopathy:     Cervical: No cervical adenopathy.  Skin:    General: Skin is warm and dry.     Capillary Refill: Capillary refill takes less than 2 seconds.     Findings: No rash.  Neurological:     Mental Status: She is alert. Mental status is at baseline.     Sensory: Sensation is intact.     Motor: Motor function is intact.     Gait: Gait is intact.  Psychiatric:        Mood and Affect: Mood normal.    ED Results / Procedures / Treatments   Labs (all labs ordered are listed, but only abnormal results are displayed) Labs Reviewed  COMPREHENSIVE METABOLIC PANEL - Abnormal; Notable for the following components:      Result Value   Glucose, Bld 105 (*)    BUN 25 (*)    Creatinine, Ser 1.16 (*)    GFR, Estimated 46 (*)    All other components within normal limits  CBC - Abnormal; Notable for the following components:   WBC 11.5 (*)    RDW 18.2 (*)    All other components within normal limits   URINALYSIS, ROUTINE W REFLEX MICROSCOPIC - Abnormal; Notable for the following components:   Color, Urine STRAW (*)    All other components within normal limits  LIPASE, BLOOD    EKG None  Radiology No results found.  Procedures Procedures   Medications Ordered in ED Medications  sodium chloride (PF) 0.9 % injection (has no administration in time range)  iohexol (OMNIPAQUE) 300 MG/ML solution 100 mL (75 mLs Intravenous Contrast Given 06/26/20 2347)    ED Course  I have reviewed the triage vital signs and the nursing notes.  Pertinent labs & imaging results that were available during my care of the patient were reviewed by me and considered in my medical decision making (see chart for details).    MDM Rules/Calculators/A&P                         87 year old female presents with concern for right lower quadrant abdominal pain with episode of diarrhea today.  No fevers or chills.  Differential diagnosis includes not limited to appendicitis, diverticulitis, mesenteric ischemia, pyelonephritis, ureterolithiasis/nephrolithiasis/urinary tract infection, colitis, constipation, bowel obstruction, partial bowel obstruction, malignancy, peritonitis, IBD, ovarian anomaly.   Hypertensive on intake, vital signs otherwise normal.  Cardiopulmonary exam is normal abdominal exam is significant for right lower quadrant, periumbilical, and suprapubic tenderness to palpation without rebound or guarding.  Anteriorly tilted pelvis secondary to scoliosis.  No CVA tenderness.  Patient is neurovascularly intact in all 4 extremities.  CBC with mild leukocytosis of 11.5, CMP with baseline creatinine 1.16, UA reassuring, no signs of infection.  Lipase is normal.  We will proceed with CT abdomen pelvis at this time.  Patient declined offered analgesia.  This patient signed out to oncoming ED provider, Antonietta Breach, PA-C at time of shift change.  All pertinent  HPI, physical exam, and laboratory findings were  discussed with her prior to my departure.  CT pending at this time.  Leisl voiced understanding of her medical evaluation and treatment plan.  Each of her questions was answered to her expressed satisfaction.  This chart was dictated using voice recognition software, Dragon. Despite the best efforts of this provider to proofread and correct errors, errors may still occur which can change documentation meaning.  Final Clinical Impression(s) / ED Diagnoses Final diagnoses:  None    Rx / DC Orders ED Discharge Orders     None        Aura Dials 06/26/20 2359    Lacretia Leigh, MD 07/02/20 1538

## 2020-06-27 DIAGNOSIS — R1031 Right lower quadrant pain: Secondary | ICD-10-CM | POA: Diagnosis not present

## 2020-06-27 NOTE — Discharge Instructions (Addendum)
Your CT today suggests a degree of constipation which may be contributing to your discomfort.  We advise follow-up with your primary care doctor for reassessment.  Return for new or concerning symptoms.

## 2020-06-27 NOTE — ED Provider Notes (Signed)
2:45 AM Care assumed from [place], PA-C at change of shift.  In short, patient presenting for right lower quadrant abdominal pain and diarrhea.  She has been hemodynamically stable.  Her CT has been reviewed which shows no acute abdominal pathology.  On my interpretation, she appears to have large stool burden in her lower abdomen.  Her splenomegaly, on chart review, is chronic.  On bedside assessment, patient is resting comfortably.  She states that her pain is improved "since everyone was pushing on my stomach".  Stable for discharge and follow-up with her primary care doctor.  Return precautions discussed and provided. Patient discharged in stable condition with no unaddressed concerns.  Results for orders placed or performed during the hospital encounter of 06/26/20  Lipase, blood  Result Value Ref Range   Lipase 44 11 - 51 U/L  Comprehensive metabolic panel  Result Value Ref Range   Sodium 140 135 - 145 mmol/L   Potassium 4.2 3.5 - 5.1 mmol/L   Chloride 106 98 - 111 mmol/L   CO2 26 22 - 32 mmol/L   Glucose, Bld 105 (H) 70 - 99 mg/dL   BUN 25 (H) 8 - 23 mg/dL   Creatinine, Ser 1.16 (H) 0.44 - 1.00 mg/dL   Calcium 9.4 8.9 - 10.3 mg/dL   Total Protein 7.1 6.5 - 8.1 g/dL   Albumin 4.7 3.5 - 5.0 g/dL   AST 18 15 - 41 U/L   ALT 11 0 - 44 U/L   Alkaline Phosphatase 44 38 - 126 U/L   Total Bilirubin 0.5 0.3 - 1.2 mg/dL   GFR, Estimated 46 (L) >60 mL/min   Anion gap 8 5 - 15  CBC  Result Value Ref Range   WBC 11.5 (H) 4.0 - 10.5 K/uL   RBC 4.42 3.87 - 5.11 MIL/uL   Hemoglobin 12.3 12.0 - 15.0 g/dL   HCT 39.9 36.0 - 46.0 %   MCV 90.3 80.0 - 100.0 fL   MCH 27.8 26.0 - 34.0 pg   MCHC 30.8 30.0 - 36.0 g/dL   RDW 18.2 (H) 11.5 - 15.5 %   Platelets 391 150 - 400 K/uL   nRBC 0.0 0.0 - 0.2 %  Urinalysis, Routine w reflex microscopic Urine, Clean Catch  Result Value Ref Range   Color, Urine STRAW (A) YELLOW   APPearance CLEAR CLEAR   Specific Gravity, Urine 1.006 1.005 - 1.030   pH  5.0 5.0 - 8.0   Glucose, UA NEGATIVE NEGATIVE mg/dL   Hgb urine dipstick NEGATIVE NEGATIVE   Bilirubin Urine NEGATIVE NEGATIVE   Ketones, ur NEGATIVE NEGATIVE mg/dL   Protein, ur NEGATIVE NEGATIVE mg/dL   Nitrite NEGATIVE NEGATIVE   Leukocytes,Ua NEGATIVE NEGATIVE   CT Abdomen Pelvis W Contrast  Result Date: 06/27/2020 CLINICAL DATA:  Right lower quadrant abdominal pain EXAM: CT ABDOMEN AND PELVIS WITH CONTRAST TECHNIQUE: Multidetector CT imaging of the abdomen and pelvis was performed using the standard protocol following bolus administration of intravenous contrast. CONTRAST:  31mL OMNIPAQUE IOHEXOL 300 MG/ML  SOLN COMPARISON:  05/08/2020 FINDINGS: LOWER CHEST: Bilateral anterior lung base opacities. HEPATOBILIARY: Normal hepatic contours. No intra- or extrahepatic biliary dilatation. The gallbladder is normal. PANCREAS: Normal pancreas. No ductal dilatation or peripancreatic fluid collection. SPLEEN: Spleen is enlarged, measuring 20.5 cm in craniocaudal dimension. ADRENALS/URINARY TRACT: The adrenal glands are normal. No hydronephrosis, nephroureterolithiasis or solid renal mass. The urinary bladder is normal for degree of distention STOMACH/BOWEL: There is no hiatal hernia. Normal duodenal course and caliber. No  small bowel dilatation or inflammation. Rectosigmoid diverticulosis without acute inflammation. Normal appendix. VASCULAR/LYMPHATIC: There is calcific atherosclerosis of the abdominal aorta. Large splenorenal shunt. REPRODUCTIVE: Status post hysterectomy. No adnexal mass. MUSCULOSKELETAL. S shaped curvature of the lumbar spine. OTHER: None. IMPRESSION: 1. No acute abnormality of the abdomen or pelvis. 2. Splenomegaly and large splenorenal shunt. 3. Rectosigmoid diverticulosis without acute inflammation. 4. Bilateral anterior lung base opacities, unchanged. Aortic Atherosclerosis (ICD10-I70.0). Electronically Signed   By: Ulyses Jarred M.D.   On: 06/27/2020 02:13     Antonietta Breach,  PA-C 06/27/20 0247    Molpus, Jenny Reichmann, MD 06/27/20 [phone]

## 2020-06-29 DIAGNOSIS — R2689 Other abnormalities of gait and mobility: Secondary | ICD-10-CM | POA: Diagnosis not present

## 2020-06-29 DIAGNOSIS — M5451 Vertebrogenic low back pain: Secondary | ICD-10-CM | POA: Diagnosis not present

## 2020-06-30 NOTE — Progress Notes (Signed)
Discharge Progress Report  Patient Details  Name: Chelsea Garcia MRN: [id number] Date of Birth: [birth year] Referring Provider:   [facility] Walk Test from 04/13/2020 in [place]  Referring Provider Dr. Loanne Drilling        Number of Visits: 5  Reason for Discharge:  Early Exit:  Due to multiple episodes of diverticulitis.  Smoking History:  Social History   Tobacco Use  Smoking Status Former   Packs/day: 1.00   Years: 26.00   Pack years: 26.00   Types: Cigarettes   Quit date: 07/01/1961   Years since quitting: 59.0  Smokeless Tobacco Never    Diagnosis:  Bronchiectasis without complication ([place])  ADL UCSD:  Pulmonary Assessment Scores     Row Name 04/13/20 1401         ADL UCSD   ADL Phase Entry     SOB Score total 23           CAT Score     CAT Score 14           mMRC Score     mMRC Score 1             Initial Exercise Prescription:  Initial Exercise Prescription - 04/13/20 1400       Date of Initial Exercise RX and Referring Provider   Date 04/13/20    Referring Provider Dr. Loanne Drilling    Expected Discharge Date 06/18/20      NuStep   Level 1    SPM 75    Minutes 15      Track   Minutes 15      Prescription Details   Frequency (times per week) 2    Duration Progress to 30 minutes of continuous aerobic without signs/symptoms of physical distress      Intensity   THRR 40-80% of Max Heartrate 54-109    Ratings of Perceived Exertion 11-13    Perceived Dyspnea 0-4      Progression   Progression Continue to progress workloads to maintain intensity without signs/symptoms of physical distress.      Resistance Training   Training Prescription Yes    Weight orange bands    Reps 10-15             Discharge Exercise Prescription (Final Exercise Prescription Changes):  Exercise Prescription Changes - 05/19/20 1500       Response to Exercise   Blood Pressure (Admit) 124/68    Blood  Pressure (Exit) 106/62    Heart Rate (Admit) 79 bpm    Heart Rate (Exercise) 89 bpm    Heart Rate (Exit) 78 bpm    Oxygen Saturation (Admit) 97 %    Oxygen Saturation (Exercise) 97 %    Oxygen Saturation (Exit) 97 %    Rating of Perceived Exertion (Exercise) 10    Perceived Dyspnea (Exercise) 2    Duration Continue with 30 min of aerobic exercise without signs/symptoms of physical distress.    Intensity THRR unchanged      Progression   Progression Continue to progress workloads to maintain intensity without signs/symptoms of physical distress.      Resistance Training   Training Prescription Yes    Weight orange bands    Reps 10-15    Time 10 Minutes      NuStep   Level 2    SPM 80    Minutes 15    METs 1.5      Track  Laps 12    Minutes 15             Functional Capacity:  6 Minute Walk     Row Name 04/13/20 1353         6 Minute Walk   Phase Initial     Distance 1075 feet     Walk Time 6 minutes     # of Rest Breaks 0     MPH 2.03     METS 1.76     RPE 13     Perceived Dyspnea  1     VO2 Peak 6.17     Symptoms No     Resting HR 80 bpm     Resting BP 102/50     Resting Oxygen Saturation  96 %     Exercise Oxygen Saturation  during 6 min walk 94 %     Max Ex. HR 101 bpm     Max Ex. BP 122/58     2 Minute Post BP 104/52           Interval HR     1 Minute HR 87     2 Minute HR 94     3 Minute HR 101     4 Minute HR 92     5 Minute HR 95     6 Minute HR 94     2 Minute Post HR 83     Interval Heart Rate? Yes           Interval Oxygen     Interval Oxygen? Yes     Baseline Oxygen Saturation % 96 %     1 Minute Oxygen Saturation % 95 %     1 Minute Liters of Oxygen 0 L     2 Minute Oxygen Saturation % 94 %     2 Minute Liters of Oxygen 0 L     3 Minute Oxygen Saturation % 94 %     3 Minute Liters of Oxygen 0 L     4 Minute Oxygen Saturation % 94 %     4 Minute Liters of Oxygen 0 L     5 Minute Oxygen Saturation % 94 %     5 Minute  Liters of Oxygen 0 L     6 Minute Oxygen Saturation % 94 %     6 Minute Liters of Oxygen 0 L     2 Minute Post Oxygen Saturation % 96 %     2 Minute Post Liters of Oxygen 0 L             Psychological, QOL, Others - Outcomes: PHQ 2/9: Depression screen PHQ 2/9 04/13/2020  Decreased Interest 0  Down, Depressed, Hopeless 0  PHQ - 2 Score 0  Altered sleeping 3  Tired, decreased energy 3  Change in appetite 0  Feeling bad or failure about yourself  0  Trouble concentrating 2  Suicidal thoughts 0  Difficult doing work/chores Not difficult at all    Quality of Life:   Personal Goals: Goals established at orientation with interventions provided to work toward goal.  Personal Goals and Risk Factors at Admission - 04/13/20 1349       Core Components/Risk Factors/Patient Goals on Admission    Weight Management Weight Maintenance              Personal Goals Discharge:  Goals and Risk Factor Review     Row Name 04/30/20 [phone] 05/25/20 1422  Core Components/Risk Factors/Patient Goals Review   Personal Goals Review Develop more efficient breathing techniques such as purse lipped breathing and diaphragmatic breathing and practicing self-pacing with activity.;Increase knowledge of respiratory medications and ability to use respiratory devices properly.;Improve shortness of breath with ADL's Develop more efficient breathing techniques such as purse lipped breathing and diaphragmatic breathing and practicing self-pacing with activity.;Increase knowledge of respiratory medications and ability to use respiratory devices properly.;Improve shortness of breath with ADL's      Review "Chelsea Garcia" just started pulmonary rehab, has attended 3 exercise sessions, too early to see progression towards program goals. Chelsea Garcia has completed 5 exercise sessions, she has been absent d/t stomach pain and diverticululitis.  Her progression has been slow d/t her absences.  Continue to progress as  tolerated on equipment.      Expected Outcomes See admission goals. See admission goals.               Exercise Goals and Review:  Exercise Goals     Row Name 04/13/20 1036             Exercise Goals   Increase Physical Activity Yes       Intervention Provide advice, education, support and counseling about physical activity/exercise needs.;Develop an individualized exercise prescription for aerobic and resistive training based on initial evaluation findings, risk stratification, comorbidities and participant's personal goals.       Expected Outcomes Short Term: Attend rehab on a regular basis to increase amount of physical activity.;Long Term: Add in home exercise to make exercise part of routine and to increase amount of physical activity.;Long Term: Exercising regularly at least 3-5 days a week.       Increase Strength and Stamina Yes       Intervention Provide advice, education, support and counseling about physical activity/exercise needs.;Develop an individualized exercise prescription for aerobic and resistive training based on initial evaluation findings, risk stratification, comorbidities and participant's personal goals.       Expected Outcomes Short Term: Increase workloads from initial exercise prescription for resistance, speed, and METs.;Short Term: Perform resistance training exercises routinely during rehab and add in resistance training at home;Long Term: Improve cardiorespiratory fitness, muscular endurance and strength as measured by increased METs and functional capacity (6MWT)       Able to understand and use rate of perceived exertion (RPE) scale Yes       Intervention Provide education and explanation on how to use RPE scale       Expected Outcomes Short Term: Able to use RPE daily in rehab to express subjective intensity level;Long Term:  Able to use RPE to guide intensity level when exercising independently       Able to understand and use Dyspnea scale Yes        Intervention Provide education and explanation on how to use Dyspnea scale       Expected Outcomes Short Term: Able to use Dyspnea scale daily in rehab to express subjective sense of shortness of breath during exertion;Long Term: Able to use Dyspnea scale to guide intensity level when exercising independently       Knowledge and understanding of Target Heart Rate Range (THRR) Yes       Intervention Provide education and explanation of THRR including how the numbers were predicted and where they are located for reference       Expected Outcomes Short Term: Able to state/look up THRR;Long Term: Able to use THRR to govern intensity when exercising independently;Short Term: Able to  use daily as guideline for intensity in rehab       Understanding of Exercise Prescription Yes       Intervention Provide education, explanation, and written materials on patient's individual exercise prescription       Expected Outcomes Short Term: Able to explain program exercise prescription;Long Term: Able to explain home exercise prescription to exercise independently                Exercise Goals Re-Evaluation:  Exercise Goals Re-Evaluation     Row Name 05/05/20 1220 06/02/20 0804           Exercise Goal Re-Evaluation   Exercise Goals Review Increase Physical Activity;Increase Strength and Stamina;Able to understand and use rate of perceived exertion (RPE) scale;Able to understand and use Dyspnea scale;Knowledge and understanding of Target Heart Rate Range (THRR);Understanding of Exercise Prescription Increase Physical Activity;Increase Strength and Stamina;Able to understand and use rate of perceived exertion (RPE) scale;Able to understand and use Dyspnea scale;Knowledge and understanding of Target Heart Rate Range (THRR);Understanding of Exercise Prescription      Comments Pt has completed 2 exercise and tolerated well so far. It is too early to note any progression, but we will continue to monitor and  progress as she is able. Pt has only completed 5 exercise sessions. She has been out for a couple of weeks due to GI problems. Pt was tolerating exercise well when she first started. Last session was 05/19/20 and she was exercising at 1.5 METS on the Nustep and 2.39 METS walking the track. Will continue to monitor and follow up with pt.      Expected Outcomes Through exercise at rehab and home, the patient will decrease shortness of breath with daily activities and feel confident in carrying out an exercise regimn at home. Through exercise at rehab and home, the patient will decrease shortness of breath with daily activities and feel confident in carrying out an exercise regimn at home.               Nutrition & Weight - Outcomes:  Pre Biometrics - 04/13/20 1348       Pre Biometrics   Grip Strength 19 kg              Nutrition:  Nutrition Therapy & Goals - 05/19/20 1433       Nutrition Therapy   Diet Generally Healthful      Personal Nutrition Goals   Nutrition Goal Pt to maintain weight    Personal Goal #2 Pt to build a healthy plate including vegetables, fruits, whole grains, and low-fat dairy products in a heart healthy meal plan.      Intervention Plan   Intervention Prescribe, educate and counsel regarding individualized specific dietary modifications aiming towards targeted core components such as weight, hypertension, lipid management, diabetes, heart failure and other comorbidities.    Expected Outcomes Short Term Goal: Understand basic principles of dietary content, such as calories, fat, sodium, cholesterol and nutrients.             Nutrition Discharge:   Education Questionnaire Score:  Knowledge Questionnaire Score - 04/13/20 1408       Knowledge Questionnaire Score   Pre Score 14/18             Goals reviewed with patient; copy given to patient.

## 2020-06-30 NOTE — Addendum Note (Signed)
Encounter addended by: Lance Morin, RN on: 06/30/2020 3:27 PM  Actions taken: Clinical Note Signed, Episode resolved

## 2020-07-01 DIAGNOSIS — M5451 Vertebrogenic low back pain: Secondary | ICD-10-CM | POA: Diagnosis not present

## 2020-07-01 DIAGNOSIS — R2689 Other abnormalities of gait and mobility: Secondary | ICD-10-CM | POA: Diagnosis not present

## 2020-07-08 DIAGNOSIS — H401113 Primary open-angle glaucoma, right eye, severe stage: Secondary | ICD-10-CM | POA: Diagnosis not present

## 2020-07-14 DIAGNOSIS — M5451 Vertebrogenic low back pain: Secondary | ICD-10-CM | POA: Diagnosis not present

## 2020-07-14 DIAGNOSIS — R2689 Other abnormalities of gait and mobility: Secondary | ICD-10-CM | POA: Diagnosis not present

## 2020-07-16 DIAGNOSIS — R2689 Other abnormalities of gait and mobility: Secondary | ICD-10-CM | POA: Diagnosis not present

## 2020-07-16 DIAGNOSIS — M5451 Vertebrogenic low back pain: Secondary | ICD-10-CM | POA: Diagnosis not present

## 2020-07-22 DIAGNOSIS — H401113 Primary open-angle glaucoma, right eye, severe stage: Secondary | ICD-10-CM | POA: Diagnosis not present

## 2020-07-27 DIAGNOSIS — R2689 Other abnormalities of gait and mobility: Secondary | ICD-10-CM | POA: Diagnosis not present

## 2020-07-27 DIAGNOSIS — M5451 Vertebrogenic low back pain: Secondary | ICD-10-CM | POA: Diagnosis not present

## 2020-08-05 ENCOUNTER — Other Ambulatory Visit: Payer: Self-pay

## 2020-08-05 ENCOUNTER — Inpatient Hospital Stay: Payer: Medicare Other

## 2020-08-05 DIAGNOSIS — D473 Essential (hemorrhagic) thrombocythemia: Secondary | ICD-10-CM

## 2020-08-12 DIAGNOSIS — R2689 Other abnormalities of gait and mobility: Secondary | ICD-10-CM | POA: Diagnosis not present

## 2020-08-12 DIAGNOSIS — M5451 Vertebrogenic low back pain: Secondary | ICD-10-CM | POA: Diagnosis not present

## 2020-08-19 ENCOUNTER — Other Ambulatory Visit: Payer: Self-pay

## 2020-08-19 ENCOUNTER — Ambulatory Visit (INDEPENDENT_AMBULATORY_CARE_PROVIDER_SITE_OTHER): Payer: Medicare Other | Admitting: Podiatry

## 2020-08-19 ENCOUNTER — Encounter: Payer: Self-pay | Admitting: Podiatry

## 2020-08-19 DIAGNOSIS — D473 Essential (hemorrhagic) thrombocythemia: Secondary | ICD-10-CM | POA: Diagnosis not present

## 2020-08-19 DIAGNOSIS — M79609 Pain in unspecified limb: Secondary | ICD-10-CM | POA: Diagnosis not present

## 2020-08-19 DIAGNOSIS — B351 Tinea unguium: Secondary | ICD-10-CM

## 2020-08-19 NOTE — Progress Notes (Signed)
This patient returns to the office for evaluation and treatment of long thick painful nails .  This patient is unable to trim her own nails since the patient cannot reach her feet.  Patient says the nails are painful walking and wearing his shoes.  He returns for preventive foot care services.  General Appearance  Alert, conversant and in no acute stress.  Vascular  Dorsalis pedis and posterior tibial  pulses are palpable  bilaterally.  Capillary return is within normal limits  bilaterally. Temperature is within normal limits  bilaterally.  Neurologic  Senn-Weinstein monofilament wire test within normal limits  bilaterally. Muscle power within normal limits bilaterally.  Nails Thick disfigured discolored nails with subungual debris  from hallux to fifth toes bilaterally. No evidence of bacterial infection or drainage bilaterally.  Orthopedic  No limitations of motion  feet .  No crepitus or effusions noted.  No bony pathology or digital deformities noted.  Skin  normotropic skin with no porokeratosis noted bilaterally.  No signs of infections or ulcers noted.     Onychomycosis  Pain in toes right foot  Pain in toes left foot  Debridement  of nails  1-5  B/L with a nail nipper.  Nails were then filed using a dremel tool with no incidents.    RTC  12 weeks    Gardiner Barefoot DPM

## 2020-08-28 DIAGNOSIS — M25473 Effusion, unspecified ankle: Secondary | ICD-10-CM | POA: Diagnosis not present

## 2020-09-01 DIAGNOSIS — Z1231 Encounter for screening mammogram for malignant neoplasm of breast: Secondary | ICD-10-CM | POA: Diagnosis not present

## 2020-09-08 DIAGNOSIS — L03115 Cellulitis of right lower limb: Secondary | ICD-10-CM | POA: Diagnosis not present

## 2020-09-08 DIAGNOSIS — Z20822 Contact with and (suspected) exposure to covid-19: Secondary | ICD-10-CM | POA: Diagnosis not present

## 2020-09-21 DIAGNOSIS — R928 Other abnormal and inconclusive findings on diagnostic imaging of breast: Secondary | ICD-10-CM | POA: Diagnosis not present

## 2020-09-21 DIAGNOSIS — R921 Mammographic calcification found on diagnostic imaging of breast: Secondary | ICD-10-CM | POA: Diagnosis not present

## 2020-09-21 DIAGNOSIS — R922 Inconclusive mammogram: Secondary | ICD-10-CM | POA: Diagnosis not present

## 2020-09-25 DIAGNOSIS — H02052 Trichiasis without entropian right lower eyelid: Secondary | ICD-10-CM | POA: Diagnosis not present

## 2020-10-07 DIAGNOSIS — N6325 Unspecified lump in the left breast, overlapping quadrants: Secondary | ICD-10-CM | POA: Diagnosis not present

## 2020-10-07 DIAGNOSIS — N6022 Fibroadenosis of left breast: Secondary | ICD-10-CM | POA: Diagnosis not present

## 2020-10-14 DIAGNOSIS — Z23 Encounter for immunization: Secondary | ICD-10-CM | POA: Diagnosis not present

## 2020-10-16 ENCOUNTER — Other Ambulatory Visit: Payer: Self-pay

## 2020-10-16 ENCOUNTER — Emergency Department (HOSPITAL_BASED_OUTPATIENT_CLINIC_OR_DEPARTMENT_OTHER)
Admission: EM | Admit: 2020-10-16 | Discharge: 2020-10-17 | Disposition: A | Discharge status: Home / Self Care | Payer: Medicare Other | Attending: Emergency Medicine | Admitting: Emergency Medicine

## 2020-10-16 DIAGNOSIS — Z87891 Personal history of nicotine dependence: Secondary | ICD-10-CM | POA: Insufficient documentation

## 2020-10-16 DIAGNOSIS — N132 Hydronephrosis with renal and ureteral calculous obstruction: Secondary | ICD-10-CM | POA: Diagnosis not present

## 2020-10-16 DIAGNOSIS — N135 Crossing vessel and stricture of ureter without hydronephrosis: Secondary | ICD-10-CM

## 2020-10-16 DIAGNOSIS — K219 Gastro-esophageal reflux disease without esophagitis: Secondary | ICD-10-CM | POA: Diagnosis not present

## 2020-10-16 DIAGNOSIS — R109 Unspecified abdominal pain: Secondary | ICD-10-CM | POA: Diagnosis present

## 2020-10-16 DIAGNOSIS — N201 Calculus of ureter: Secondary | ICD-10-CM | POA: Diagnosis not present

## 2020-10-16 LAB — URINALYSIS, ROUTINE W REFLEX MICROSCOPIC
Bilirubin Urine: NEGATIVE
Glucose, UA: NEGATIVE mg/dL
Hgb urine dipstick: NEGATIVE
Ketones, ur: NEGATIVE mg/dL
Nitrite: NEGATIVE
Protein, ur: 30 mg/dL — AB
Specific Gravity, Urine: 1.02 (ref 1.005–1.030)
pH: 5.5 (ref 5.0–8.0)

## 2020-10-16 MED ORDER — LACTATED RINGERS IV BOLUS
1000.0000 mL | Freq: Once | INTRAVENOUS | Status: AC
  Administered 2020-10-17: 1000 mL via INTRAVENOUS

## 2020-10-16 MED ORDER — ONDANSETRON HCL 4 MG/2ML IJ SOLN
4.0000 mg | Freq: Once | INTRAMUSCULAR | Status: AC
  Administered 2020-10-17: 4 mg via INTRAVENOUS
  Filled 2020-10-16: qty 2

## 2020-10-16 MED ORDER — FENTANYL CITRATE PF 50 MCG/ML IJ SOSY
50.0000 ug | PREFILLED_SYRINGE | Freq: Once | INTRAMUSCULAR | Status: AC
  Administered 2020-10-17: 50 ug via INTRAVENOUS
  Filled 2020-10-16: qty 1

## 2020-10-16 NOTE — ED Triage Notes (Signed)
Pt to ED from home with c/o Left flank pain that started this AM that has progressively gotten worse. Pt states she has had intermittent N/V throughout the day. No pain or burning with urination with small episodes of diarrhea.

## 2020-10-17 ENCOUNTER — Emergency Department (HOSPITAL_BASED_OUTPATIENT_CLINIC_OR_DEPARTMENT_OTHER): Payer: Medicare Other

## 2020-10-17 ENCOUNTER — Encounter (HOSPITAL_BASED_OUTPATIENT_CLINIC_OR_DEPARTMENT_OTHER): Payer: Self-pay | Admitting: Radiology

## 2020-10-17 DIAGNOSIS — R1110 Vomiting, unspecified: Secondary | ICD-10-CM | POA: Diagnosis not present

## 2020-10-17 DIAGNOSIS — N132 Hydronephrosis with renal and ureteral calculous obstruction: Secondary | ICD-10-CM | POA: Diagnosis not present

## 2020-10-17 DIAGNOSIS — N201 Calculus of ureter: Secondary | ICD-10-CM | POA: Diagnosis not present

## 2020-10-17 LAB — CBC WITH DIFFERENTIAL/PLATELET
Abs Immature Granulocytes: 0.76 10*3/uL — ABNORMAL HIGH (ref 0.00–0.07)
Basophils Absolute: 0.1 10*3/uL (ref 0.0–0.1)
Basophils Relative: 0 %
Eosinophils Absolute: 0.1 10*3/uL (ref 0.0–0.5)
Eosinophils Relative: 0 %
HCT: 39.1 % (ref 36.0–46.0)
Hemoglobin: 12.3 g/dL (ref 12.0–15.0)
Immature Granulocytes: 5 %
Lymphocytes Relative: 3 %
Lymphs Abs: 0.4 10*3/uL — ABNORMAL LOW (ref 0.7–4.0)
MCH: 28 pg (ref 26.0–34.0)
MCHC: 31.5 g/dL (ref 30.0–36.0)
MCV: 88.9 fL (ref 80.0–100.0)
Monocytes Absolute: 0.8 10*3/uL (ref 0.1–1.0)
Monocytes Relative: 5 %
Neutro Abs: 12.4 10*3/uL — ABNORMAL HIGH (ref 1.7–7.7)
Neutrophils Relative %: 87 %
Platelets: 346 10*3/uL (ref 150–400)
RBC: 4.4 MIL/uL (ref 3.87–5.11)
RDW: 18.5 % — ABNORMAL HIGH (ref 11.5–15.5)
WBC: 14.4 10*3/uL — ABNORMAL HIGH (ref 4.0–10.5)
nRBC: 0 % (ref 0.0–0.2)

## 2020-10-17 LAB — COMPREHENSIVE METABOLIC PANEL
ALT: 8 U/L (ref 0–44)
AST: 13 U/L — ABNORMAL LOW (ref 15–41)
Albumin: 4.5 g/dL (ref 3.5–5.0)
Alkaline Phosphatase: 39 U/L (ref 38–126)
Anion gap: 9 (ref 5–15)
BUN: 22 mg/dL (ref 8–23)
CO2: 23 mmol/L (ref 22–32)
Calcium: 9.2 mg/dL (ref 8.9–10.3)
Chloride: 106 mmol/L (ref 98–111)
Creatinine, Ser: 1 mg/dL (ref 0.44–1.00)
GFR, Estimated: 55 mL/min — ABNORMAL LOW (ref >60)
Glucose, Bld: 128 mg/dL — ABNORMAL HIGH (ref 70–99)
Potassium: 4.2 mmol/L (ref 3.5–5.1)
Sodium: 138 mmol/L (ref 135–145)
Total Bilirubin: 0.6 mg/dL (ref 0.3–1.2)
Total Protein: 7.1 g/dL (ref 6.5–8.1)

## 2020-10-17 LAB — LIPASE, BLOOD: Lipase: 26 U/L (ref 11–51)

## 2020-10-17 MED ORDER — OXYCODONE-ACETAMINOPHEN 5-325 MG PO TABS
1.0000 | ORAL_TABLET | Freq: Once | ORAL | Status: AC
  Administered 2020-10-17: 1 via ORAL
  Filled 2020-10-17: qty 1

## 2020-10-17 MED ORDER — IOHEXOL 300 MG/ML  SOLN
75.0000 mL | Freq: Once | INTRAMUSCULAR | Status: AC | PRN
  Administered 2020-10-17: 75 mL via INTRAVENOUS

## 2020-10-17 MED ORDER — IOHEXOL 9 MG/ML PO SOLN
500.0000 mL | ORAL | Status: AC
  Administered 2020-10-17 (×2): 500 mL via ORAL

## 2020-10-17 MED ORDER — OXYCODONE-ACETAMINOPHEN 5-325 MG PO TABS: 2.0000 | ORAL_TABLET | ORAL | 0 refills | Status: DC | PRN

## 2020-10-17 NOTE — ED Provider Notes (Signed)
[facility] EMERGENCY DEPT Provider Note   CSN: [id number] Arrival date & time: 10/16/20  1911     History Chief Complaint  Patient presents with   Flank Pain    Chelsea Garcia is a 87 y.o. female.   Flank Pain This is a new problem. The current episode started yesterday. The problem occurs constantly. The problem has been gradually worsening. Pertinent negatives include no chest pain, no abdominal pain, no headaches and no shortness of breath. Nothing aggravates the symptoms. Nothing relieves the symptoms. She has tried nothing for the symptoms. The treatment provided no relief.      Past Medical History:  Diagnosis Date   Colon polyps    Benign. 2 colonoscopies in past. Due now again.   Complication of anesthesia    "got dizzy in 70's"   Endometriosis    required TAH when young   Essential tremor    since 40's   GERD (gastroesophageal reflux disease)    denis   Glaucoma    started in 40's   Low back pain    since 40's   Migraine    started in 50's   Osteopenia    2014   Seasonal allergies    to Dust, mold and pollen.   Thrombocytosis September 2015   Tick bites    she likes out door activities, 2 this year (2015)    Patient Active Problem List   Diagnosis Date Noted   Bronchiectasis without complication ([place]) 42/68/3419   Laryngopharyngeal reflux (LPR) 08/20/2019   Chronic cough 08/19/2019   Globus pharyngeus 07/16/2019   Bilateral hearing loss 11/01/2017   Deviated septum 09/21/2016   Seasonal allergic rhinitis 09/21/2016   Essential thrombocytosis ([place]) 10/01/2013    Past Surgical History:  Procedure Laterality Date   CATARACT EXTRACTION Left 02/07/2013   INGUINAL HERNIA REPAIR Left 01/09/2020   Procedure: LEFT INGUINAL HERNIA REPAIR WITH MESH;  Surgeon: Erroll Luna, MD;  Location: [facility];  Service: General;  Laterality: Left;   INSERTION OF MESH Left 01/09/2020   Procedure: INSERTION OF MESH;  Surgeon: Erroll Luna, MD;  Location: [facility];  Service: General;  Laterality: Left;   TOTAL ABDOMINAL HYSTERECTOMY  1970's   TUBAL LIGATION       OB History   No obstetric history on file.     Family History  Problem Relation Age of Onset   Thyroid disease Mother    Cancer Father    Cancer Maternal Aunt 80    Social History   Tobacco Use   Smoking status: Former    Packs/day: 1.00    Years: 26.00    Pack years: 26.00    Types: Cigarettes    Quit date: 07/01/1961    Years since quitting: 59.3   Smokeless tobacco: Never  Substance Use Topics   Alcohol use: Yes    Comment: very rare   Drug use: No    Home Medications Prior to Admission medications   Medication Sig Start Date End Date Taking? Authorizing Provider  oxyCODONE-acetaminophen (PERCOCET) 5-325 MG tablet Take 2 tablets by mouth every 4 (four) hours as needed. 10/17/20  Yes Briah Nary, Corene Cornea, MD  acetaminophen (TYLENOL) 500 MG tablet Take 500 mg by mouth every 6 (six) hours as needed for moderate pain.    [provider]  amoxicillin-clavulanate (AUGMENTIN) 875-125 MG tablet SMARTSIG:1 Tablet(s) By Mouth Every 12 Hours 05/20/20   [provider]  brimonidine (ALPHAGAN) 0.15 % ophthalmic solution Place 1  drop into both eyes 2 (two) times daily. 08/16/19   [provider]  brimonidine (ALPHAGAN) 0.2 % ophthalmic solution SMARTSIG:In Eye(s) 08/17/20   [provider]  calcium carbonate (OS-CAL) 1250 (500 Ca) MG chewable tablet Chew 1 tablet by mouth daily.    [provider]  dicyclomine (BENTYL) 20 MG tablet Take 1 tablet (20 mg total) by mouth 2 (two) times daily. 05/08/20   Varney Biles, MD  Fluticasone-Salmeterol (ADVAIR DISKUS) 100-50 MCG/DOSE AEPB Inhale 1 puff into the lungs in the morning and at bedtime. 10/23/19   Margaretha Seeds, MD  fluticasone-salmeterol (ADVAIR) 100-50 MCG/ACT AEPB Inhale 1 puff into the lungs 2 (two) times daily.    [provider]   fluticasone-salmeterol (ADVAIR) 100-50 MCG/ACT AEPB Inhale 1 puff into the lungs 2 (two) times daily.    [provider]  HYDROcodone-acetaminophen (NORCO/VICODIN) 5-325 MG tablet Take 2 tablets by mouth every 12 (twelve) hours as needed for severe pain (severe pain not better with tylenol/ibuprofen). 05/08/20   Varney Biles, MD  Latanoprostene Bunod (VYZULTA) 0.024 % SOLN Place 1 drop into both eyes daily.    [provider]  Multiple Vitamin (MULTI VITAMIN) TABS Take 2 tablets by mouth daily.    [provider]  Netarsudil Dimesylate 0.02 % SOLN Place 1 drop into both eyes daily. 10/12/17   [provider]  prednisoLONE acetate (PRED FORTE) 1 % ophthalmic suspension Place 1 drop into the right eye 4 (four) times daily. 07/21/20   [provider]    Allergies    Nystatin, Bee pollen, Pollen extract, Albuterol, Mangifera indica fruit ext (mango) [mangifera indica], Mite (d. farinae), and Sulfamethoxazole  Review of Systems   Review of Systems  Respiratory:  Negative for shortness of breath.   Cardiovascular:  Negative for chest pain.  Gastrointestinal:  Negative for abdominal pain.  Genitourinary:  Positive for flank pain.  Neurological:  Negative for headaches.  All other systems reviewed and are negative.  Physical Exam Updated Vital Signs BP 125/83 (BP Location: Right Arm)   Pulse 71   Temp 98.3 F (36.8 C) (Oral)   Resp 20   Ht 5' (1.524 m)   Wt 49.9 kg   SpO2 100%   BMI 21.48 kg/m   Physical Exam Vitals and nursing note reviewed.  Constitutional:      Appearance: She is well-developed.  HENT:     Head: Normocephalic and atraumatic.     Nose: Nose normal. No congestion or rhinorrhea.     Mouth/Throat:     Mouth: Mucous membranes are moist.     Pharynx: Oropharynx is clear.  Eyes:     Pupils: Pupils are equal, round, and reactive to light.  Cardiovascular:     Rate and Rhythm: Normal rate and regular rhythm.  Pulmonary:      Effort: No respiratory distress.     Breath sounds: No stridor.  Abdominal:     General: Abdomen is flat. There is no distension.  Musculoskeletal:        General: Tenderness (left CVA) present. No swelling. Normal range of motion.     Cervical back: Normal range of motion.  Skin:    General: Skin is warm and dry.  Neurological:     General: No focal deficit present.     Mental Status: She is alert.    ED Results / Procedures / Treatments   Labs (all labs ordered are listed, but only abnormal results are displayed) Labs Reviewed  URINALYSIS,  ROUTINE W REFLEX MICROSCOPIC - Abnormal; Notable for the following components:      Result Value   Protein, ur 30 (*)    Leukocytes,Ua TRACE (*)    All other components within normal limits  COMPREHENSIVE METABOLIC PANEL - Abnormal; Notable for the following components:   Glucose, Bld 128 (*)    AST 13 (*)    GFR, Estimated 55 (*)    All other components within normal limits  CBC WITH DIFFERENTIAL/PLATELET - Abnormal; Notable for the following components:   WBC 14.4 (*)    RDW 18.5 (*)    Neutro Abs 12.4 (*)    Lymphs Abs 0.4 (*)    Abs Immature Granulocytes 0.76 (*)    All other components within normal limits  URINE CULTURE  LIPASE, BLOOD    EKG None  Radiology CT ABDOMEN PELVIS W CONTRAST  Result Date: 10/17/2020 CLINICAL DATA:  Left flank pain, nausea, vomiting EXAM: CT ABDOMEN AND PELVIS WITH CONTRAST TECHNIQUE: Multidetector CT imaging of the abdomen and pelvis was performed using the standard protocol following bolus administration of intravenous contrast. CONTRAST:  76mL OMNIPAQUE IOHEXOL 300 MG/ML  SOLN COMPARISON:  06/26/2020 FINDINGS: Lower chest: Stable parenchymal scarring within the visualized lung bases. Stable cardiomegaly. Small hiatal hernia. Hepatobiliary: There is engorgement of the hepatic venous vasculature as well as dilation of the portal vein which may reflect changes of post hepatic portal venous  hypertension secondary to right heart failure. This appears similar to prior examination. The liver is otherwise unremarkable. No intra or extrahepatic biliary ductal dilation. Gallbladder is unremarkable. Pancreas: Unremarkable Spleen: Moderate splenomegaly is stable with the spleen measuring greater than 19 cm in greatest dimension. No intrasplenic masses are seen. Adrenals/Urinary Tract: The adrenal glands are unremarkable. The kidneys are normal in size. Left kidney appears displaced inferomedially secondary to splenomegaly. Moderate right hydronephrosis with particular dilation of the right renal pelvis and decompression of right ureter is again identified in keeping with a right UPJ obstruction. There has developed moderate to severe left hydronephrosis with perirenal inflammatory stranding suggesting a superimposed inflammatory process as can be seen with a ascending urinary tract infection. The left renal pelvis demonstrates marked dilation with decompression of the left ureter suggesting a UPJ obstruction. No intrarenal or ureteral calculi are identified. The bladder is unremarkable. Stomach/Bowel: Stomach and small bowel are unremarkable. Appendix normal. Small rectocele noted with of the rectum herniating through the right ischial rectal fossa. The large bowel is otherwise unremarkable. No evidence of obstruction or focal inflammation. Small free fluid within the pelvis. No free intraperitoneal gas. Vascular/Lymphatic: Mild aortoiliac atherosclerotic calcification. No aortic aneurysm. No pathologic adenopathy within the abdomen and pelvis. Reproductive: Status post hysterectomy. No adnexal masses. Other: No abdominal wall hernia. Musculoskeletal: No acute bone abnormality. Degenerative changes are seen within the lumbar spine. IMPRESSION: Interval development of marked left hydronephrosis with particular dilation of the left renal pelvis and decompression of the left ureter suggesting a acquired UPJ  obstruction. Superimposed pararenal inflammatory stranding may reflect superimposed infection and correlation with urinalysis and urine culture may be helpful. Stable moderate right hydronephrosis with dilation of the right renal pelvis suggesting a right UPJ obstruction. Stable cardiomegaly. Engorgement of the hepatic veins may reflect some degree of right heart failure. Stable dilation of the portal and splenic vein and moderate splenomegaly, possibly the sequela of post hepatic portal venous hypertension. This may be better assessed by transjugular liver biopsy and hemodynamic measurement if indicated. Small rectocele.  No evidence of obstruction.  Aortic Atherosclerosis (ICD10-I70.0). Electronically Signed   By: Fidela Salisbury M.D.   On: 10/17/2020 02:29    Procedures Procedures   Medications Ordered in ED Medications  lactated ringers bolus 1,000 mL (0 mLs Intravenous Stopped 10/17/20 0115)  fentaNYL (SUBLIMAZE) injection 50 mcg (50 mcg Intravenous Given 10/17/20 0013)  ondansetron (ZOFRAN) injection 4 mg (4 mg Intravenous Given 10/17/20 0012)  iohexol (OMNIPAQUE) 9 MG/ML oral solution 500 mL (500 mLs Oral Contrast Given 10/17/20 0118)  iohexol (OMNIPAQUE) 300 MG/ML solution 75 mL (75 mLs Intravenous Contrast Given 10/17/20 0159)  oxyCODONE-acetaminophen (PERCOCET/ROXICET) 5-325 MG per tablet 1 tablet (1 tablet Oral Given 10/17/20 0413)    ED Course  I have reviewed the triage vital signs and the nursing notes.  Pertinent labs & imaging results that were available during my care of the patient were reviewed by me and considered in my medical decision making (see chart for details).    MDM Rules/Calculators/A&P                         Patient found to have bilateral hydro nephrosis.  Acquired on the left and worse on the left.  Is also probably explains her symptoms.  She does not have urinary tract infection.  Her kidney function is fine.  Her pain is improved with medications here.  I did  discuss with Dr. Thurmond Butts with urology and he request follow-up next week as long as she is asymptomatic.  I discussed with her and her family.  Will discharge on a short course of pain medication with anticipated follow-up next week for further management.  Urine culture sent.   Final Clinical Impression(s) / ED Diagnoses Final diagnoses:  UPJ obstruction, acquired    Rx / DC Orders ED Discharge Orders          Ordered    oxyCODONE-acetaminophen (PERCOCET) 5-325 MG tablet  Every 4 hours PRN        10/17/20 0406             Sharnetta Gielow, Corene Cornea, MD 10/17/20 [phone]

## 2020-10-18 LAB — URINE CULTURE

## 2020-10-21 ENCOUNTER — Other Ambulatory Visit: Payer: Self-pay | Admitting: Pulmonary Disease

## 2020-10-22 DIAGNOSIS — N130 Hydronephrosis with ureteropelvic junction obstruction: Secondary | ICD-10-CM | POA: Diagnosis not present

## 2020-10-27 DIAGNOSIS — L03115 Cellulitis of right lower limb: Secondary | ICD-10-CM | POA: Diagnosis not present

## 2020-10-30 ENCOUNTER — Encounter (HOSPITAL_BASED_OUTPATIENT_CLINIC_OR_DEPARTMENT_OTHER): Payer: Self-pay | Admitting: Obstetrics and Gynecology

## 2020-10-30 ENCOUNTER — Emergency Department (HOSPITAL_BASED_OUTPATIENT_CLINIC_OR_DEPARTMENT_OTHER)
Admission: EM | Admit: 2020-10-30 | Discharge: 2020-10-30 | Disposition: A | Discharge status: Home / Self Care | Payer: Medicare Other | Attending: Emergency Medicine | Admitting: Emergency Medicine

## 2020-10-30 ENCOUNTER — Other Ambulatory Visit: Payer: Self-pay

## 2020-10-30 DIAGNOSIS — L03115 Cellulitis of right lower limb: Secondary | ICD-10-CM | POA: Diagnosis not present

## 2020-10-30 DIAGNOSIS — Z87891 Personal history of nicotine dependence: Secondary | ICD-10-CM | POA: Diagnosis not present

## 2020-10-30 LAB — CBC WITH DIFFERENTIAL/PLATELET
Abs Immature Granulocytes: 0.9 10*3/uL — ABNORMAL HIGH (ref 0.00–0.07)
Band Neutrophils: 4 %
Basophils Absolute: 0 10*3/uL (ref 0.0–0.1)
Basophils Relative: 0 %
Eosinophils Absolute: 0 10*3/uL (ref 0.0–0.5)
Eosinophils Relative: 0 %
HCT: 39.2 % (ref 36.0–46.0)
Hemoglobin: 12.4 g/dL (ref 12.0–15.0)
Lymphocytes Relative: 2 %
Lymphs Abs: 0.3 10*3/uL — ABNORMAL LOW (ref 0.7–4.0)
MCH: 28.3 pg (ref 26.0–34.0)
MCHC: 31.6 g/dL (ref 30.0–36.0)
MCV: 89.5 fL (ref 80.0–100.0)
Metamyelocytes Relative: 2 %
Monocytes Absolute: 0.3 10*3/uL (ref 0.1–1.0)
Monocytes Relative: 2 %
Myelocytes: 4 %
Neutro Abs: 12.9 10*3/uL — ABNORMAL HIGH (ref 1.7–7.7)
Neutrophils Relative %: 86 %
Platelets: 385 10*3/uL (ref 150–400)
RBC: 4.38 MIL/uL (ref 3.87–5.11)
RDW: 18.6 % — ABNORMAL HIGH (ref 11.5–15.5)
WBC: 14.3 10*3/uL — ABNORMAL HIGH (ref 4.0–10.5)
nRBC: 0 % (ref 0.0–0.2)

## 2020-10-30 LAB — COMPREHENSIVE METABOLIC PANEL
ALT: 11 U/L (ref 0–44)
AST: 14 U/L — ABNORMAL LOW (ref 15–41)
Albumin: 4.5 g/dL (ref 3.5–5.0)
Alkaline Phosphatase: 45 U/L (ref 38–126)
Anion gap: 8 (ref 5–15)
BUN: 24 mg/dL — ABNORMAL HIGH (ref 8–23)
CO2: 26 mmol/L (ref 22–32)
Calcium: 9.6 mg/dL (ref 8.9–10.3)
Chloride: 105 mmol/L (ref 98–111)
Creatinine, Ser: 1.04 mg/dL — ABNORMAL HIGH (ref 0.44–1.00)
GFR, Estimated: 53 mL/min — ABNORMAL LOW (ref >60)
Glucose, Bld: 101 mg/dL — ABNORMAL HIGH (ref 70–99)
Potassium: 4.4 mmol/L (ref 3.5–5.1)
Sodium: 139 mmol/L (ref 135–145)
Total Bilirubin: 0.7 mg/dL (ref 0.3–1.2)
Total Protein: 7.1 g/dL (ref 6.5–8.1)

## 2020-10-30 MED ORDER — FENTANYL CITRATE PF 50 MCG/ML IJ SOSY: 25.0000 ug | PREFILLED_SYRINGE | Freq: Once | INTRAMUSCULAR | Status: DC

## 2020-10-30 NOTE — ED Notes (Signed)
Pt states that she feels fine and does not want anything for pain

## 2020-10-30 NOTE — ED Provider Notes (Signed)
[facility] EMERGENCY DEPT Provider Note   CSN: [id number] Arrival date & time: 10/30/20  1144     History Chief Complaint  Patient presents with   Cellulitis    Chelsea Garcia is a 87 y.o. female with history of essential thrombocytosis who presents to the emergency department for right lower leg cellulitis that has been improving over the last 3 days.  She was initially seen and evaluated at primary care doctor who placed her on Keflex and instructed her if it does not improve in the next 72 hours for her to come to the emergency department for further evaluation and possible IV antibiotics.  Patient states that her leg is improving but wanted to get evaluated as a precaution.  She denies any fever, chills, chest pain, shortness of breath, abdominal pain, nausea, vomiting, diarrhea.  She rates her leg pain mild in severity.  She states that her erythema has improved.  HPI     Past Medical History:  Diagnosis Date   Colon polyps    Benign. 2 colonoscopies in past. Due now again.   Complication of anesthesia    "got dizzy in 70's"   Endometriosis    required TAH when young   Essential tremor    since 40's   GERD (gastroesophageal reflux disease)    denis   Glaucoma    started in 40's   Low back pain    since 40's   Migraine    started in 50's   Osteopenia    2014   Seasonal allergies    to Dust, mold and pollen.   Thrombocytosis September 2015   Tick bites    she likes out door activities, 2 this year (2015)    Patient Active Problem List   Diagnosis Date Noted   Bronchiectasis without complication ([place]) 85/63/1497   Laryngopharyngeal reflux (LPR) 08/20/2019   Chronic cough 08/19/2019   Globus pharyngeus 07/16/2019   Bilateral hearing loss 11/01/2017   Deviated septum 09/21/2016   Seasonal allergic rhinitis 09/21/2016   Essential thrombocytosis ([place]) 10/01/2013    Past Surgical History:  Procedure Laterality Date   CATARACT EXTRACTION Left  02/07/2013   INGUINAL HERNIA REPAIR Left 01/09/2020   Procedure: LEFT INGUINAL HERNIA REPAIR WITH MESH;  Surgeon: Erroll Luna, MD;  Location: [facility];  Service: General;  Laterality: Left;   INSERTION OF MESH Left 01/09/2020   Procedure: INSERTION OF MESH;  Surgeon: Erroll Luna, MD;  Location: [facility];  Service: General;  Laterality: Left;   TOTAL ABDOMINAL HYSTERECTOMY  1970's   TUBAL LIGATION       OB History   No obstetric history on file.     Family History  Problem Relation Age of Onset   Thyroid disease Mother    Cancer Father    Cancer Maternal Aunt 80    Social History   Tobacco Use   Smoking status: Former    Packs/day: 1.00    Years: 26.00    Pack years: 26.00    Types: Cigarettes    Quit date: 07/01/1961    Years since quitting: 59.3   Smokeless tobacco: Never  Vaping Use   Vaping Use: Never used  Substance Use Topics   Alcohol use: Yes    Comment: very rare   Drug use: No    Home Medications Prior to Admission medications   Medication Sig Start Date End Date Taking? Authorizing Provider  acetaminophen (TYLENOL) 500 MG tablet Take 500 mg by  mouth every 6 (six) hours as needed for moderate pain.    [provider]  amoxicillin-clavulanate (AUGMENTIN) 875-125 MG tablet SMARTSIG:1 Tablet(s) By Mouth Every 12 Hours 05/20/20   [provider]  brimonidine (ALPHAGAN) 0.15 % ophthalmic solution Place 1 drop into both eyes 2 (two) times daily. 08/16/19   [provider]  brimonidine (ALPHAGAN) 0.2 % ophthalmic solution SMARTSIG:In Eye(s) 08/17/20   [provider]  calcium carbonate (OS-CAL) 1250 (500 Ca) MG chewable tablet Chew 1 tablet by mouth daily.    [provider]  dicyclomine (BENTYL) 20 MG tablet Take 1 tablet (20 mg total) by mouth 2 (two) times daily. 05/08/20   Varney Biles, MD  fluticasone-salmeterol (ADVAIR) 100-50 MCG/ACT AEPB Inhale 1 puff into the lungs 2 (two)  times daily.    [provider]  fluticasone-salmeterol (ADVAIR) 100-50 MCG/ACT AEPB Inhale 1 puff into the lungs 2 (two) times daily.    [provider]  HYDROcodone-acetaminophen (NORCO/VICODIN) 5-325 MG tablet Take 2 tablets by mouth every 12 (twelve) hours as needed for severe pain (severe pain not better with tylenol/ibuprofen). 05/08/20   Varney Biles, MD  Latanoprostene Bunod (VYZULTA) 0.024 % SOLN Place 1 drop into both eyes daily.    [provider]  Multiple Vitamin (MULTI VITAMIN) TABS Take 2 tablets by mouth daily.    [provider]  Netarsudil Dimesylate 0.02 % SOLN Place 1 drop into both eyes daily. 10/12/17   [provider]  oxyCODONE-acetaminophen (PERCOCET) 5-325 MG tablet Take 2 tablets by mouth every 4 (four) hours as needed. 10/17/20   Mesner, Corene Cornea, MD  prednisoLONE acetate (PRED FORTE) 1 % ophthalmic suspension Place 1 drop into the right eye 4 (four) times daily. 07/21/20   [provider]  Grant Ruts INHUB 100-50 MCG/ACT AEPB INHALE 1 PUFF INTO THE LUNGS IN THE MORNING AND AT BEDTIME 10/21/20   Margaretha Seeds, MD    Allergies    Nystatin, Bee pollen, Pollen extract, Albuterol, Mangifera indica fruit ext (mango) [mangifera indica], Mite (d. farinae), and Sulfamethoxazole  Review of Systems   Review of Systems  All other systems reviewed and are negative.  Physical Exam Updated Vital Signs BP (!) 124/97 (BP Location: Right Arm)   Pulse 78   Temp 98.5 F (36.9 C) (Oral)   Resp 18   Ht 5\' 1"  (1.549 m)   Wt 48 kg   SpO2 100%   BMI 19.99 kg/m   Physical Exam Vitals and nursing note reviewed.  Constitutional:      General: She is not in acute distress.    Appearance: Normal appearance.  HENT:     Head: Normocephalic and atraumatic.  Eyes:     General:        Right eye: No discharge.        Left eye: No discharge.  Cardiovascular:     Comments: Regular rate and rhythm.  S1/S2 are distinct without any  evidence of murmur, rubs, or gallops.  Radial pulses are 2+ bilaterally.  Dorsalis pedis pulses are 2+ bilaterally.  No evidence of pedal edema. Pulmonary:     Comments: Clear to auscultation bilaterally.  Normal effort.  No respiratory distress.  No evidence of wheezes, rales, or rhonchi heard throughout. Abdominal:     General: Abdomen is flat. Bowel sounds are normal. There is no distension.     Tenderness: There is no abdominal tenderness. There is no guarding or rebound.  Musculoskeletal:        General:  Normal range of motion.     Cervical back: Neck supple.  Skin:    General: Skin is warm and dry.     Comments: Minimal erythema over the right ankle and foot.  Area is minimally swollen.  No signs of fluctuance or warmth.  Neurological:     General: No focal deficit present.     Mental Status: She is alert.  Psychiatric:        Mood and Affect: Mood normal.        Behavior: Behavior normal.    ED Results / Procedures / Treatments   Labs (all labs ordered are listed, but only abnormal results are displayed) Labs Reviewed  COMPREHENSIVE METABOLIC PANEL - Abnormal; Notable for the following components:      Result Value   Glucose, Bld 101 (*)    BUN 24 (*)    Creatinine, Ser 1.04 (*)    AST 14 (*)    GFR, Estimated 53 (*)    All other components within normal limits  CBC WITH DIFFERENTIAL/PLATELET - Abnormal; Notable for the following components:   WBC 14.3 (*)    RDW 18.6 (*)    Neutro Abs 12.9 (*)    Lymphs Abs 0.3 (*)    Abs Immature Granulocytes 0.90 (*)    All other components within normal limits  PATHOLOGIST SMEAR REVIEW    EKG None  Radiology No results found.  Procedures Procedures   Medications Ordered in ED Medications - No data to display  ED Course  I have reviewed the triage vital signs and the nursing notes.  Pertinent labs & imaging results that were available during my care of the patient were reviewed by me and considered in my medical  decision making (see chart for details).  Clinical Course as of 10/30/20 1349  Fri Oct 30, 2020  1343 I discussed this case with my attending physician who cosigned this note including patient's presenting symptoms, physical exam, and planned diagnostics and interventions. Attending physician stated agreement with plan or made changes to plan which were implemented.    [CF]    Clinical Course User Index [CF] Cherrie Gauze   MDM Rules/Calculators/A&P                          Nkechi Linehan is a 87 y.o. female who presents to the emergency department for for evaluation of right lower leg cellulitis.  Clinically, the right lower leg and foot appear improved when comparing to a picture on the patient's phone.  The area was on tender or warm to palpation.  I have a low suspicion of overlying abscess at this time.  I have a low suspicion for osteomyelitis or DVT at this time.  I instructed the patient to continue using Keflex and to follow-up with her primary care doctor regarding teardrop cells on her blood work.  Patient understood.  All questions and concerns addressed.   Final Clinical Impression(s) / ED Diagnoses Final diagnoses:  Cellulitis of right lower extremity    Rx / DC Orders ED Discharge Orders     None        Cherrie Gauze 10/30/20 1349    Dorie Rank, MD 11/01/20 [phone]

## 2020-10-30 NOTE — Discharge Instructions (Addendum)
You were seen and evaluated in the emergency department today for evaluation of right lower leg cellulitis.  As we discussed, this looks like it is healing well.  Please continue taking your Keflex and to its entirety.  We did find some markers on your blood work today that were abnormal.  I would like for you to follow-up with your primary care doctor within the next week to discuss these results.  Please return to the emergency department if you experience worsening swelling, redness, drainage, fever, chills, or any other concerns you may have.

## 2020-10-30 NOTE — ED Triage Notes (Signed)
Patient reports to the ER for cellulitis. Patient reports she has been on oral Keflex for 3 days and her PCP told her to go to the ER for a check on it and see if she needs IV antibiotics.

## 2020-11-05 ENCOUNTER — Inpatient Hospital Stay: Payer: Medicare Other

## 2020-11-05 ENCOUNTER — Encounter: Payer: Self-pay | Admitting: Hematology

## 2020-11-05 ENCOUNTER — Inpatient Hospital Stay: Payer: Medicare Other | Attending: Hematology | Admitting: Hematology

## 2020-11-05 ENCOUNTER — Other Ambulatory Visit: Payer: Self-pay

## 2020-11-05 VITALS — BP 123/67 | HR 69 | Temp 98.1°F | Resp 16 | Ht 61.0 in | Wt 111.9 lb

## 2020-11-05 DIAGNOSIS — K219 Gastro-esophageal reflux disease without esophagitis: Secondary | ICD-10-CM | POA: Diagnosis not present

## 2020-11-05 DIAGNOSIS — Z91030 Bee allergy status: Secondary | ICD-10-CM | POA: Diagnosis not present

## 2020-11-05 DIAGNOSIS — Z7982 Long term (current) use of aspirin: Secondary | ICD-10-CM | POA: Diagnosis not present

## 2020-11-05 DIAGNOSIS — Z7951 Long term (current) use of inhaled steroids: Secondary | ICD-10-CM | POA: Diagnosis not present

## 2020-11-05 DIAGNOSIS — D7581 Myelofibrosis: Secondary | ICD-10-CM | POA: Diagnosis not present

## 2020-11-05 DIAGNOSIS — D473 Essential (hemorrhagic) thrombocythemia: Secondary | ICD-10-CM | POA: Diagnosis not present

## 2020-11-05 DIAGNOSIS — Z882 Allergy status to sulfonamides status: Secondary | ICD-10-CM | POA: Insufficient documentation

## 2020-11-05 DIAGNOSIS — K589 Irritable bowel syndrome without diarrhea: Secondary | ICD-10-CM | POA: Diagnosis not present

## 2020-11-05 DIAGNOSIS — Z8719 Personal history of other diseases of the digestive system: Secondary | ICD-10-CM | POA: Diagnosis not present

## 2020-11-05 DIAGNOSIS — Z881 Allergy status to other antibiotic agents status: Secondary | ICD-10-CM | POA: Diagnosis not present

## 2020-11-05 DIAGNOSIS — R161 Splenomegaly, not elsewhere classified: Secondary | ICD-10-CM | POA: Diagnosis not present

## 2020-11-05 DIAGNOSIS — M419 Scoliosis, unspecified: Secondary | ICD-10-CM | POA: Diagnosis not present

## 2020-11-05 LAB — COMPREHENSIVE METABOLIC PANEL WITH GFR
ALT: 11 U/L (ref 0–44)
AST: 15 U/L (ref 15–41)
Albumin: 4.3 g/dL (ref 3.5–5.0)
Alkaline Phosphatase: 47 U/L (ref 38–126)
Anion gap: 10 (ref 5–15)
BUN: 25 mg/dL — ABNORMAL HIGH (ref 8–23)
CO2: 22 mmol/L (ref 22–32)
Calcium: 9.3 mg/dL (ref 8.9–10.3)
Chloride: 110 mmol/L (ref 98–111)
Creatinine, Ser: 1 mg/dL (ref 0.44–1.00)
GFR, Estimated: 55 mL/min — ABNORMAL LOW (ref >60)
Glucose, Bld: 103 mg/dL — ABNORMAL HIGH (ref 70–99)
Potassium: 4.2 mmol/L (ref 3.5–5.1)
Sodium: 142 mmol/L (ref 135–145)
Total Bilirubin: 0.6 mg/dL (ref 0.3–1.2)
Total Protein: 7 g/dL (ref 6.5–8.1)

## 2020-11-05 LAB — CBC WITH DIFFERENTIAL/PLATELET
Abs Immature Granulocytes: 0.95 10*3/uL — ABNORMAL HIGH (ref 0.00–0.07)
Basophils Absolute: 0.1 10*3/uL (ref 0.0–0.1)
Basophils Relative: 1 %
Eosinophils Absolute: 0.1 10*3/uL (ref 0.0–0.5)
Eosinophils Relative: 1 %
HCT: 37 % (ref 36.0–46.0)
Hemoglobin: 11.6 g/dL — ABNORMAL LOW (ref 12.0–15.0)
Immature Granulocytes: 7 %
Lymphocytes Relative: 6 %
Lymphs Abs: 0.7 10*3/uL (ref 0.7–4.0)
MCH: 28.1 pg (ref 26.0–34.0)
MCHC: 31.4 g/dL (ref 30.0–36.0)
MCV: 89.6 fL (ref 80.0–100.0)
Monocytes Absolute: 0.7 10*3/uL (ref 0.1–1.0)
Monocytes Relative: 5 %
Neutro Abs: 10.5 10*3/uL — ABNORMAL HIGH (ref 1.7–7.7)
Neutrophils Relative %: 80 %
Platelets: 370 10*3/uL (ref 150–400)
RBC: 4.13 MIL/uL (ref 3.87–5.11)
RDW: 18.6 % — ABNORMAL HIGH (ref 11.5–15.5)
WBC: 13.1 10*3/uL — ABNORMAL HIGH (ref 4.0–10.5)
nRBC: 0.2 % (ref 0.0–0.2)

## 2020-11-05 NOTE — Progress Notes (Signed)
[facility]   Telephone:([phone]) [phone] [phone]) [phone]   Clinic Follow up Note   Patient Care Team: Lujean Amel, MD as PCP - General (Family Medicine) Bernadene Bell, MD (Inactive) as Consulting Physician (Hematology)  Date of Service:  11/05/2020  CHIEF COMPLAINT: f/u of essential thrombocytosis  CURRENT THERAPY:  -Hydroxyurea 561m daily started 11/27/2013. Decreased to 5045mM-F in 12/2017, further decrease to 5001mWF on 07/06/2018. Reduced to 500m59m 2 days a week starting 12/25/18. Reduced to 500mg79me weekly starting 03/18/19. Held since 02/05/20 due to well controlled ET.  -Aspirin 81mg 57my   ASSESSMENT & PLAN:  Chelsea GEvora Schechter87 y.o56female with   1. Essential thrombocytosis, JAK2(+) -Diagnosed in 11/2013 with plt initially in 600 range. Due to her advanced age, she is at high risk for thrombosis secondary to ET. We previously discussed the goal of therapy is palliative, and prevent thrombosis. Unfortunately it is not curable, and there is a small risk in the involved to acute leukemia  and myelofibrosis down the road. -I previously discussed the possibility of her ET evolving to myelofibrosis leukemia, so far her WBC has been normal, if the concerns arises in future, I may recommend repeating bone marrow biopsy to further evaluate.  -She has been taking hydrea since 2015, mostly at low dose tolerating well. Given the good control of her ET, we stopped her hydrea since 02/05/20. If her platelets increase over 500K, I would recommend restarting hydrea. She is agreeable.  -She is clinically doing well and stable. CBC today shows WBC 13.1, plt 370K. Overall ET remains controlled off Hydrea. I recommend she take baby aspirin, even off the hydrea. -Will continue to monitor with labs every 3 months. F/u in 6 months.    2. IBS, Acid Reflux, Scoliosis   -Her CT CAP from 03/2017 showed diverticulosis and moderate splenomegaly. -Her IBS is overall mild and manageable        PLAN: -continue off Hydrea and monitor lab -Lab in 3 and 6 months  -F/u in 6 months    No problem-specific Assessment & Plan notes found for this encounter.   INTERVAL HISTORY:  Chelsea Garcia for a follow up of thrombosis. She was last seen by me on 05/06/20. She presents to the clinic alone. She reports doing well overall.   All other systems were reviewed with the patient and are negative.  MEDICAL HISTORY:  Past Medical History:  Diagnosis Date   Colon polyps    Benign. 2 colonoscopies in past. Due now again.   Complication of anesthesia    "got dizzy in 70's"   Endometriosis    required TAH when young   Essential tremor    since 40's   GERD (gastroesophageal reflux disease)    denis   Glaucoma    started in 40's   Low back pain    since 40's   Migraine    started in 50's   Osteopenia    2014   Seasonal allergies    to Dust, mold and pollen.   Thrombocytosis September 2015   Tick bites    she likes out door activities, 2 this year (2015)    SURGICAL HISTORY: Past Surgical History:  Procedure Laterality Date   CATARACT EXTRACTION Left 02/07/2013   INGUINAL HERNIA REPAIR Left 01/09/2020   Procedure: LEFT INGUINAL HERNIA REPAIR WITH MESH;  Surgeon: CornetErroll Luna Location: [facility]: General;  Laterality: Left;  INSERTION OF MESH Left 01/09/2020   Procedure: INSERTION OF MESH;  Surgeon: Erroll Luna, MD;  Location: [facility];  Service: General;  Laterality: Left;   TOTAL ABDOMINAL HYSTERECTOMY  1970's   TUBAL LIGATION      I have reviewed the social history and family history with the patient and they are unchanged from previous note.  ALLERGIES:  is allergic to nystatin, bee pollen, pollen extract, albuterol, mangifera indica fruit ext (mango) [mangifera indica], mite (d. farinae), and sulfamethoxazole.  MEDICATIONS:  Current Outpatient Medications  Medication Sig Dispense Refill    acetaminophen (TYLENOL) 500 MG tablet Take 500 mg by mouth every 6 (six) hours as needed for moderate pain.     brimonidine (ALPHAGAN) 0.15 % ophthalmic solution Place 1 drop into both eyes 2 (two) times daily.     brimonidine (ALPHAGAN) 0.2 % ophthalmic solution SMARTSIG:In Eye(s)     calcium carbonate (OS-CAL) 1250 (500 Ca) MG chewable tablet Chew 1 tablet by mouth daily.     fluticasone-salmeterol (ADVAIR) 100-50 MCG/ACT AEPB Inhale 1 puff into the lungs 2 (two) times daily.     fluticasone-salmeterol (ADVAIR) 100-50 MCG/ACT AEPB Inhale 1 puff into the lungs 2 (two) times daily.     Multiple Vitamin (MULTI VITAMIN) TABS Take 2 tablets by mouth daily.     Netarsudil Dimesylate 0.02 % SOLN Place 1 drop into both eyes daily.     prednisoLONE acetate (PRED FORTE) 1 % ophthalmic suspension Place 1 drop into the right eye 4 (four) times daily.     WIXELA INHUB 100-50 MCG/ACT AEPB INHALE 1 PUFF INTO THE LUNGS IN THE MORNING AND AT BEDTIME 60 each 2   No current facility-administered medications for this visit.    PHYSICAL EXAMINATION: ECOG PERFORMANCE STATUS: 1 - Symptomatic but completely ambulatory  Vitals:   11/05/20 1044  BP: 123/67  Pulse: 69  Resp: 16  Temp: 98.1 F (36.7 C)  SpO2: 98%   Wt Readings from Last 3 Encounters:  11/05/20 50.8 kg  10/30/20 48 kg  10/16/20 49.9 kg     GENERAL:alert, no distress and comfortable SKIN: skin color normal, no rashes or significant lesions EYES: normal, Conjunctiva are pink and non-injected, sclera clear  NEURO: alert & oriented x 3 with fluent speech  LABORATORY DATA:  I have reviewed the data as listed CBC Latest Ref Rng & Units 11/05/2020 10/30/2020 10/16/2020  WBC 4.0 - 10.5 K/uL 13.1(H) 14.3(H) 14.4(H)  Hemoglobin 12.0 - 15.0 g/dL 11.6(L) 12.4 12.3  Hematocrit 36.0 - 46.0 % 37.0 39.2 39.1  Platelets 150 - 400 K/uL 370 385 346     CMP Latest Ref Rng & Units 11/05/2020 10/30/2020 10/16/2020  Glucose 70 - 99 mg/dL 103(H) 101(H)  128(H)  BUN 8 - 23 mg/dL 25(H) 24(H) 22  Creatinine 0.44 - 1.00 mg/dL 1.00 1.04(H) 1.00  Sodium 135 - 145 mmol/L 142 139 138  Potassium 3.5 - 5.1 mmol/L 4.2 4.4 4.2  Chloride 98 - 111 mmol/L 110 105 106  CO2 22 - 32 mmol/L 22 26 23   Calcium 8.9 - 10.3 mg/dL 9.3 9.6 9.2  Total Protein 6.5 - 8.1 g/dL 7.0 7.1 7.1  Total Bilirubin 0.3 - 1.2 mg/dL 0.6 0.7 0.6  Alkaline Phos 38 - 126 U/L 47 45 39  AST 15 - 41 U/L 15 14(L) 13(L)  ALT 0 - 44 U/L 11 11 8       RADIOGRAPHIC STUDIES: I have personally reviewed the radiological images as listed and agreed with  the findings in the report. No results found.    No orders of the defined types were placed in this encounter.  All questions were answered. The patient knows to call the clinic with any problems, questions or concerns. No barriers to learning was detected. The total time spent in the appointment was 20 minutes.     Truitt Merle, MD 11/05/2020   I, Wilburn Mylar, am acting as scribe for Truitt Merle, MD.   I have reviewed the above documentation for accuracy and completeness, and I agree with the above.

## 2020-11-06 LAB — PATHOLOGIST SMEAR REVIEW

## 2020-11-13 DIAGNOSIS — H01002 Unspecified blepharitis right lower eyelid: Secondary | ICD-10-CM | POA: Diagnosis not present

## 2020-11-13 DIAGNOSIS — H01005 Unspecified blepharitis left lower eyelid: Secondary | ICD-10-CM | POA: Diagnosis not present

## 2020-11-17 DIAGNOSIS — R195 Other fecal abnormalities: Secondary | ICD-10-CM | POA: Diagnosis not present

## 2020-11-17 DIAGNOSIS — L0390 Cellulitis, unspecified: Secondary | ICD-10-CM | POA: Diagnosis not present

## 2020-11-20 ENCOUNTER — Emergency Department (HOSPITAL_BASED_OUTPATIENT_CLINIC_OR_DEPARTMENT_OTHER)
Admission: EM | Admit: 2020-11-20 | Discharge: 2020-11-21 | Disposition: A | Discharge status: Home / Self Care | Payer: Medicare Other | Attending: Emergency Medicine | Admitting: Emergency Medicine

## 2020-11-20 ENCOUNTER — Other Ambulatory Visit: Payer: Self-pay

## 2020-11-20 DIAGNOSIS — R161 Splenomegaly, not elsewhere classified: Secondary | ICD-10-CM | POA: Insufficient documentation

## 2020-11-20 DIAGNOSIS — R109 Unspecified abdominal pain: Secondary | ICD-10-CM | POA: Diagnosis present

## 2020-11-20 DIAGNOSIS — R112 Nausea with vomiting, unspecified: Secondary | ICD-10-CM

## 2020-11-20 DIAGNOSIS — R197 Diarrhea, unspecified: Secondary | ICD-10-CM

## 2020-11-20 DIAGNOSIS — N1330 Unspecified hydronephrosis: Secondary | ICD-10-CM | POA: Insufficient documentation

## 2020-11-20 DIAGNOSIS — K5730 Diverticulosis of large intestine without perforation or abscess without bleeding: Secondary | ICD-10-CM | POA: Insufficient documentation

## 2020-11-20 DIAGNOSIS — Z87891 Personal history of nicotine dependence: Secondary | ICD-10-CM | POA: Insufficient documentation

## 2020-11-20 DIAGNOSIS — R1032 Left lower quadrant pain: Secondary | ICD-10-CM | POA: Diagnosis not present

## 2020-11-20 LAB — URINALYSIS, ROUTINE W REFLEX MICROSCOPIC
Bilirubin Urine: NEGATIVE
Glucose, UA: NEGATIVE mg/dL
Hgb urine dipstick: NEGATIVE
Ketones, ur: NEGATIVE mg/dL
Nitrite: NEGATIVE
Specific Gravity, Urine: 1.016 (ref 1.005–1.030)
pH: 5.5 (ref 5.0–8.0)

## 2020-11-20 MED ORDER — MORPHINE SULFATE (PF) 4 MG/ML IV SOLN
4.0000 mg | Freq: Once | INTRAVENOUS | Status: AC
Start: 2020-11-20 — End: 2020-11-20
  Administered 2020-11-20: 4 mg via INTRAVENOUS
  Filled 2020-11-20: qty 1

## 2020-11-20 MED ORDER — IOHEXOL 9 MG/ML PO SOLN
500.0000 mL | ORAL | Status: AC
  Administered 2020-11-20 – 2020-11-21 (×2): 500 mL via ORAL

## 2020-11-20 MED ORDER — LACTATED RINGERS IV BOLUS
1000.0000 mL | Freq: Once | INTRAVENOUS | Status: AC
  Administered 2020-11-20: 1000 mL via INTRAVENOUS

## 2020-11-20 NOTE — ED Triage Notes (Signed)
Pt to ED from home with c/o Diarrhea since Tuesday after PT was diagnosed with cellulitis of left foot and prescribed with PO abx. Pt states ever since beginning her abx she has several episodes of diarrhea and feels weak and is experiencing ABD cramping. Pt denies N/V.

## 2020-11-20 NOTE — ED Provider Notes (Signed)
[facility] EMERGENCY DEPT Provider Note   CSN: [id number] Arrival date & time: 11/20/20  2311     History Chief Complaint  Patient presents with   Diarrhea    Chelsea Garcia is a 87 y.o. female.  The history is provided by the patient.  Diarrhea She has history of essential thrombocytosis, bronchiectasis and comes in complaining of pain in the left mid abdomen which started this evening.  Pain is severe and she rates it 9/10.  Nothing makes it better, nothing makes it worse.  As she has had diarrhea for the last 3 days which did not improve with taking Pepto-Bismol, but has subsided following a dose of loperamide.  There was no blood or mucus in the stool.  She denies any nausea or vomiting.  She denies fever or chills.   Past Medical History:  Diagnosis Date   Colon polyps    Benign. 2 colonoscopies in past. Due now again.   Complication of anesthesia    "got dizzy in 70's"   Endometriosis    required TAH when young   Essential tremor    since 40's   GERD (gastroesophageal reflux disease)    denis   Glaucoma    started in 40's   Low back pain    since 40's   Migraine    started in 50's   Osteopenia    2014   Seasonal allergies    to Dust, mold and pollen.   Thrombocytosis September 2015   Tick bites    she likes out door activities, 2 this year (2015)    Patient Active Problem List   Diagnosis Date Noted   Bronchiectasis without complication ([place]) 27/74/1287   Laryngopharyngeal reflux (LPR) 08/20/2019   Chronic cough 08/19/2019   Globus pharyngeus 07/16/2019   Bilateral hearing loss 11/01/2017   Deviated septum 09/21/2016   Seasonal allergic rhinitis 09/21/2016   Essential thrombocytosis ([place]) 10/01/2013    Past Surgical History:  Procedure Laterality Date   CATARACT EXTRACTION Left 02/07/2013   INGUINAL HERNIA REPAIR Left 01/09/2020   Procedure: LEFT INGUINAL HERNIA REPAIR WITH MESH;  Surgeon: Erroll Luna, MD;  Location: [facility];  Service: General;  Laterality: Left;   INSERTION OF MESH Left 01/09/2020   Procedure: INSERTION OF MESH;  Surgeon: Erroll Luna, MD;  Location: [facility];  Service: General;  Laterality: Left;   TOTAL ABDOMINAL HYSTERECTOMY  1970's   TUBAL LIGATION       OB History   No obstetric history on file.     Family History  Problem Relation Age of Onset   Thyroid disease Mother    Cancer Father    Cancer Maternal Aunt 80    Social History   Tobacco Use   Smoking status: Former    Packs/day: 1.00    Years: 26.00    Pack years: 26.00    Types: Cigarettes    Quit date: 07/01/1961    Years since quitting: 59.4   Smokeless tobacco: Never  Vaping Use   Vaping Use: Never used  Substance Use Topics   Alcohol use: Yes    Comment: very rare   Drug use: No    Home Medications Prior to Admission medications   Medication Sig Start Date End Date Taking? Authorizing Provider  acetaminophen (TYLENOL) 500 MG tablet Take 500 mg by mouth every 6 (six) hours as needed for moderate pain.    [provider]  brimonidine (ALPHAGAN) 0.15 % ophthalmic solution  Place 1 drop into both eyes 2 (two) times daily. 08/16/19   [provider]  brimonidine (ALPHAGAN) 0.2 % ophthalmic solution SMARTSIG:In Eye(s) 08/17/20   [provider]  calcium carbonate (OS-CAL) 1250 (500 Ca) MG chewable tablet Chew 1 tablet by mouth daily.    [provider]  fluticasone-salmeterol (ADVAIR) 100-50 MCG/ACT AEPB Inhale 1 puff into the lungs 2 (two) times daily.    [provider]  fluticasone-salmeterol (ADVAIR) 100-50 MCG/ACT AEPB Inhale 1 puff into the lungs 2 (two) times daily.    [provider]  Multiple Vitamin (MULTI VITAMIN) TABS Take 2 tablets by mouth daily.    [provider]  Netarsudil Dimesylate 0.02 % SOLN Place 1 drop into both eyes daily. 10/12/17   [provider]  prednisoLONE acetate (PRED FORTE) 1 %  ophthalmic suspension Place 1 drop into the right eye 4 (four) times daily. 07/21/20   [provider]  Grant Ruts INHUB 100-50 MCG/ACT AEPB INHALE 1 PUFF INTO THE LUNGS IN THE MORNING AND AT BEDTIME 10/21/20   Margaretha Seeds, MD    Allergies    Nystatin, Bee pollen, Pollen extract, Albuterol, Mangifera indica fruit ext (mango) [mangifera indica], Mite (d. farinae), and Sulfamethoxazole  Review of Systems   Review of Systems  Gastrointestinal:  Positive for diarrhea.  All other systems reviewed and are negative.  Physical Exam Updated Vital Signs BP (!) 144/67 (BP Location: Right Arm)   Pulse 78   Resp 20   Ht 5\' 1"  (1.549 m)   Wt 50.8 kg   SpO2 98%   BMI 21.16 kg/m   Physical Exam Vitals and nursing note reviewed.  87 year old female, resting comfortably and in no acute distress. Vital signs are significant for borderline elevated blood pressure. Oxygen saturation is 98%, which is normal. Head is normocephalic and atraumatic. PERRLA, EOMI. Oropharynx is clear. Neck is nontender and supple without adenopathy or JVD. Back is nontender and there is no CVA tenderness. Lungs are clear without rales, wheezes, or rhonchi. Chest is nontender. Heart has regular rate and rhythm without murmur. Abdomen is soft, flat, with moderate tenderness in the left side of the abdomen.  Spleen is palpable in the left mid and upper abdomen and is mildly tender.  There is no rebound or guarding.  There are no other masses or hepatosplenomegaly.  Peristalsis is hypoactive. Extremities have no cyanosis or edema, full range of motion is present. Skin is warm and dry without rash. Neurologic: Mental status is normal, cranial nerves are intact, moves all extremities equally.  ED Results / Procedures / Treatments   Labs (all labs ordered are listed, but only abnormal results are displayed) Labs Reviewed  COMPREHENSIVE METABOLIC PANEL - Abnormal; Notable for the following components:      Result  Value   Glucose, Bld 104 (*)    BUN 25 (*)    Creatinine, Ser 1.13 (*)    AST 12 (*)    GFR, Estimated 48 (*)    All other components within normal limits  CBC WITH DIFFERENTIAL/PLATELET - Abnormal; Notable for the following components:   WBC 14.5 (*)    RDW 18.6 (*)    Platelets 406 (*)    Neutro Abs 12.0 (*)    Abs Immature Granulocytes 0.89 (*)    All other components within normal limits  URINALYSIS, ROUTINE W REFLEX MICROSCOPIC - Abnormal; Notable for the following components:   Protein, ur TRACE (*)    Leukocytes,Ua SMALL (*)  All other components within normal limits  LIPASE, BLOOD   Radiology CT ABDOMEN PELVIS W CONTRAST  Result Date: 11/21/2020 CLINICAL DATA:  Diarrhea, on antibiotics for cellulitis EXAM: CT ABDOMEN AND PELVIS WITH CONTRAST TECHNIQUE: Multidetector CT imaging of the abdomen and pelvis was performed using the standard protocol following bolus administration of intravenous contrast. CONTRAST:  59mL OMNIPAQUE IOHEXOL 300 MG/ML  SOLN COMPARISON:  10/17/2020 FINDINGS: Lower chest: Lingular and right middle lobe scarring/atelectasis with peribronchovascular nodularity, incompletely visualized but suggesting sequela of chronic atypical mycobacterial infection. Cardiomegaly. Hepatobiliary: Macro lobular hepatic contour. Gallbladder is unremarkable. No intrahepatic or extrahepatic duct dilatation. Pancreas: Within normal limits. Spleen: Splenomegaly, measuring 18.1 cm in maximal craniocaudal dimension. Adrenals/Urinary Tract: Adrenal glands are within normal limits. Right kidney is notable for an extrarenal pelvis, but without frank hydronephrosis, improved. Left kidney is notable for severe hydronephrosis with UPJ stenosis, unchanged. Mild perinephric fluid/stranding. Bladder is within normal limits. Stomach/Bowel: Stomach is within normal limits. No evidence of bowel obstruction. Appendix is not discretely visualized. Sigmoid diverticulosis, without evidence of  diverticulitis. Vascular/Lymphatic: No evidence of abdominal aortic aneurysm. Atherosclerotic calcifications of the abdominal aorta and branch vessels. Atherosclerotic calcifications of the abdominal aorta and branch vessels. Enlarged portal vein, patent.  Perigastric varices. No suspicious abdominopelvic lymphadenopathy. Reproductive: Status post hysterectomy. Bilateral ovaries are within normal limits. Other: Trace fluid along the left pericolic gutter, unchanged. Musculoskeletal: Degenerative changes of the lumbar spine. IMPRESSION: Sigmoid diverticulosis, without evidence of diverticulitis. Severe left hydronephrosis with UPJ stenosis, unchanged. Associated mild left perinephric fluid/stranding with trace ascites along the left pericolic gutter, similar. Small right extrarenal pelvis with improvement of prior hydronephrosis. Stable splenomegaly. Large portal vein, patent. Perigastric varices. Electronically Signed   By: Julian Hy M.D.   On: 11/21/2020 02:06    Procedures Procedures   Medications Ordered in ED Medications  morphine 4 MG/ML injection 4 mg (4 mg Intravenous Given 11/20/20 2353)  lactated ringers bolus 1,000 mL (0 mLs Intravenous Stopped 11/21/20 0053)  iohexol (OMNIPAQUE) 9 MG/ML oral solution 500 mL (500 mLs Oral Contrast Given 11/21/20 0048)  morphine 4 MG/ML injection 4 mg (4 mg Intravenous Given 11/21/20 0040)  iohexol (OMNIPAQUE) 300 MG/ML solution 75 mL (75 mLs Intravenous Contrast Given 11/21/20 0139)  ondansetron (ZOFRAN) injection 4 mg (4 mg Intravenous Given 11/21/20 0248)  morphine 4 MG/ML injection 4 mg (4 mg Intravenous Given 11/21/20 0248)    ED Course  I have reviewed the triage vital signs and the nursing notes.  Pertinent labs & imaging results that were available during my care of the patient were reviewed by me and considered in my medical decision making (see chart for details).   MDM Rules/Calculators/A&P                         Diarrhea with  left-sided abdominal pain and left mid and upper abdominal mass.  Consider diverticulitis, abscess, primary tumor.  Old records are reviewed, and she had an ED visit on 10/16/2020 for flank pain at which time CT scan showed bilateral hydronephrosis.  We will check screening labs and give IV fluids as well as morphine.  Will check CT abdomen and pelvis.  CT scan continues to show bilateral hydronephrosis with no acute process.  I suspect her pain is related to hydronephrosis.  Pain was eventually relieved with morphine.  She did develop nausea and vomiting was given a dose of ondansetron with improvement in nausea.  She does states she has an  appointment with a urologist in about 3 weeks.  She is given a prescription for ondansetron oral dissolving tablet as well as oxycodone-acetaminophen.  She is advised to see if her appointment with urology can be moved sooner.  Return precautions discussed.  Final Clinical Impression(s) / ED Diagnoses Final diagnoses:  Left sided abdominal pain  Nausea and vomiting, unspecified vomiting type  Diarrhea, unspecified type  Hydronephrosis, unspecified hydronephrosis type    Rx / DC Orders ED Discharge Orders          Ordered    ondansetron (ZOFRAN-ODT) 8 MG disintegrating tablet  Every 8 hours PRN        11/21/20 0451    oxyCODONE-acetaminophen (PERCOCET) 5-325 MG tablet  Every 4 hours PRN        11/21/20 4854             Delora Fuel, MD 62/70/35 [phone]

## 2020-11-21 ENCOUNTER — Emergency Department (HOSPITAL_BASED_OUTPATIENT_CLINIC_OR_DEPARTMENT_OTHER): Payer: Medicare Other

## 2020-11-21 ENCOUNTER — Encounter (HOSPITAL_BASED_OUTPATIENT_CLINIC_OR_DEPARTMENT_OTHER): Payer: Self-pay | Admitting: Radiology

## 2020-11-21 DIAGNOSIS — K5730 Diverticulosis of large intestine without perforation or abscess without bleeding: Secondary | ICD-10-CM | POA: Diagnosis not present

## 2020-11-21 DIAGNOSIS — N1330 Unspecified hydronephrosis: Secondary | ICD-10-CM | POA: Diagnosis not present

## 2020-11-21 DIAGNOSIS — R161 Splenomegaly, not elsewhere classified: Secondary | ICD-10-CM | POA: Diagnosis not present

## 2020-11-21 LAB — COMPREHENSIVE METABOLIC PANEL
ALT: 8 U/L (ref 0–44)
AST: 12 U/L — ABNORMAL LOW (ref 15–41)
Albumin: 4.3 g/dL (ref 3.5–5.0)
Alkaline Phosphatase: 42 U/L (ref 38–126)
Anion gap: 9 (ref 5–15)
BUN: 25 mg/dL — ABNORMAL HIGH (ref 8–23)
CO2: 23 mmol/L (ref 22–32)
Calcium: 9.4 mg/dL (ref 8.9–10.3)
Chloride: 106 mmol/L (ref 98–111)
Creatinine, Ser: 1.13 mg/dL — ABNORMAL HIGH (ref 0.44–1.00)
GFR, Estimated: 48 mL/min — ABNORMAL LOW (ref >60)
Glucose, Bld: 104 mg/dL — ABNORMAL HIGH (ref 70–99)
Potassium: 4.4 mmol/L (ref 3.5–5.1)
Sodium: 138 mmol/L (ref 135–145)
Total Bilirubin: 0.6 mg/dL (ref 0.3–1.2)
Total Protein: 7.2 g/dL (ref 6.5–8.1)

## 2020-11-21 LAB — CBC WITH DIFFERENTIAL/PLATELET
Abs Immature Granulocytes: 0.89 10*3/uL — ABNORMAL HIGH (ref 0.00–0.07)
Basophils Absolute: 0.1 10*3/uL (ref 0.0–0.1)
Basophils Relative: 1 %
Eosinophils Absolute: 0.1 10*3/uL (ref 0.0–0.5)
Eosinophils Relative: 1 %
HCT: 39.2 % (ref 36.0–46.0)
Hemoglobin: 12.1 g/dL (ref 12.0–15.0)
Immature Granulocytes: 6 %
Lymphocytes Relative: 5 %
Lymphs Abs: 0.7 10*3/uL (ref 0.7–4.0)
MCH: 27.7 pg (ref 26.0–34.0)
MCHC: 30.9 g/dL (ref 30.0–36.0)
MCV: 89.7 fL (ref 80.0–100.0)
Monocytes Absolute: 0.8 10*3/uL (ref 0.1–1.0)
Monocytes Relative: 5 %
Neutro Abs: 12 10*3/uL — ABNORMAL HIGH (ref 1.7–7.7)
Neutrophils Relative %: 82 %
Platelets: 406 10*3/uL — ABNORMAL HIGH (ref 150–400)
RBC: 4.37 MIL/uL (ref 3.87–5.11)
RDW: 18.6 % — ABNORMAL HIGH (ref 11.5–15.5)
WBC: 14.5 10*3/uL — ABNORMAL HIGH (ref 4.0–10.5)
nRBC: 0 % (ref 0.0–0.2)

## 2020-11-21 LAB — LIPASE, BLOOD: Lipase: 38 U/L (ref 11–51)

## 2020-11-21 MED ORDER — ONDANSETRON HCL 4 MG/2ML IJ SOLN
4.0000 mg | Freq: Once | INTRAMUSCULAR | Status: AC
  Administered 2020-11-21: 4 mg via INTRAVENOUS
  Filled 2020-11-21: qty 2

## 2020-11-21 MED ORDER — OXYCODONE-ACETAMINOPHEN 5-325 MG PO TABS: 1.0000 | ORAL_TABLET | ORAL | 0 refills | Status: DC | PRN

## 2020-11-21 MED ORDER — MORPHINE SULFATE (PF) 4 MG/ML IV SOLN
4.0000 mg | Freq: Once | INTRAVENOUS | Status: AC
  Administered 2020-11-21: 4 mg via INTRAVENOUS
  Filled 2020-11-21: qty 1

## 2020-11-21 MED ORDER — IOHEXOL 300 MG/ML  SOLN
75.0000 mL | Freq: Once | INTRAMUSCULAR | Status: AC | PRN
  Administered 2020-11-21: 75 mL via INTRAVENOUS

## 2020-11-21 MED ORDER — ONDANSETRON 8 MG PO TBDP: 8.0000 mg | ORAL_TABLET | Freq: Three times a day (TID) | ORAL | 0 refills | Status: DC | PRN

## 2020-11-21 NOTE — Discharge Instructions (Addendum)
Take loperamide as needed for diarrhea.  Return if symptoms or not being adequately controlled at home.

## 2020-11-23 ENCOUNTER — Ambulatory Visit: Payer: Medicare Other | Admitting: Podiatry

## 2020-11-23 ENCOUNTER — Telehealth: Payer: Self-pay

## 2020-11-23 NOTE — Telephone Encounter (Signed)
Spoke with pt via telephone to go over recent lab results.  Informed pt that Dr. Burr Medico has no further concerns regarding her labs.  Pt verbalized understanding and had no additional question.

## 2020-11-24 ENCOUNTER — Other Ambulatory Visit: Payer: Self-pay | Admitting: Urology

## 2020-11-25 ENCOUNTER — Encounter (HOSPITAL_BASED_OUTPATIENT_CLINIC_OR_DEPARTMENT_OTHER): Payer: Self-pay | Admitting: Urology

## 2020-11-25 ENCOUNTER — Other Ambulatory Visit: Payer: Self-pay

## 2020-11-25 DIAGNOSIS — N135 Crossing vessel and stricture of ureter without hydronephrosis: Secondary | ICD-10-CM

## 2020-11-25 DIAGNOSIS — H04129 Dry eye syndrome of unspecified lacrimal gland: Secondary | ICD-10-CM

## 2020-11-25 HISTORY — DX: Dry eye syndrome of unspecified lacrimal gland: H04.129

## 2020-11-25 HISTORY — DX: Crossing vessel and stricture of ureter without hydronephrosis: N13.5

## 2020-11-25 NOTE — Progress Notes (Addendum)
Spoke w/ via phone for pre-op interview---pt Lab needs dos----  none             Lab results------cbc with dif, lipase, cmp 11-20-2020 epic COVID test -----patient states asymptomatic no test needed Arrive at -------915 am 11-26-2020 NPO after MN NO Solid Food.  Clear liquids from MN until---815 am  Med rec completed Medications to take morning of surgery -----eye drops, imodium prn, ciprofloxacin Diabetic medication -----n/a Patient instructed no nail polish to be worn day of surgery Patient instructed to bring photo id and insurance card day of surgery Patient aware to have Driver (ride ) / caregiver    for 24 hours after surgery  spouse [facility] Patient Special Instructions -----none Pre-Op special Istructions -----none Patient verbalized understanding of instructions that were given at this phone interview. Patient denies shortness of breath, chest pain, fever, cough at this phone interview.   [facility] oncology essential thrombocytopenia (resolved) dr Truitt Merle 02-05-2020 epic [facility] pulmonary dr Rayann Heman  01-21-2020 epic, pt last pulmonary Maxie Barb 06-02-2020, pt states she has no sob with activity and can climb flight of stairs without difficulty and no oxygen use at home.   Pt stopped 81 mg aspirin 11-24-2020 per dr Kipp Brood instructions

## 2020-11-26 ENCOUNTER — Encounter (HOSPITAL_BASED_OUTPATIENT_CLINIC_OR_DEPARTMENT_OTHER)
Admission: RE | Disposition: A | Discharge status: Home / Self Care | Payer: Self-pay | Source: Ambulatory Visit | Attending: Urology

## 2020-11-26 ENCOUNTER — Ambulatory Visit (HOSPITAL_BASED_OUTPATIENT_CLINIC_OR_DEPARTMENT_OTHER): Payer: Medicare Other | Admitting: Anesthesiology

## 2020-11-26 ENCOUNTER — Ambulatory Visit (HOSPITAL_BASED_OUTPATIENT_CLINIC_OR_DEPARTMENT_OTHER)
Admission: RE | Admit: 2020-11-26 | Discharge: 2020-11-26 | Disposition: A | Discharge status: Home / Self Care | Payer: Medicare Other | Source: Ambulatory Visit | Attending: Urology | Admitting: Urology

## 2020-11-26 ENCOUNTER — Encounter (HOSPITAL_BASED_OUTPATIENT_CLINIC_OR_DEPARTMENT_OTHER): Payer: Self-pay | Admitting: Urology

## 2020-11-26 ENCOUNTER — Other Ambulatory Visit: Payer: Self-pay

## 2020-11-26 DIAGNOSIS — D75839 Thrombocytosis, unspecified: Secondary | ICD-10-CM | POA: Diagnosis not present

## 2020-11-26 DIAGNOSIS — N135 Crossing vessel and stricture of ureter without hydronephrosis: Secondary | ICD-10-CM | POA: Diagnosis not present

## 2020-11-26 DIAGNOSIS — Z87891 Personal history of nicotine dependence: Secondary | ICD-10-CM | POA: Insufficient documentation

## 2020-11-26 DIAGNOSIS — N8110 Cystocele, unspecified: Secondary | ICD-10-CM | POA: Diagnosis not present

## 2020-11-26 DIAGNOSIS — J342 Deviated nasal septum: Secondary | ICD-10-CM | POA: Diagnosis not present

## 2020-11-26 DIAGNOSIS — G43909 Migraine, unspecified, not intractable, without status migrainosus: Secondary | ICD-10-CM | POA: Diagnosis not present

## 2020-11-26 DIAGNOSIS — N952 Postmenopausal atrophic vaginitis: Secondary | ICD-10-CM | POA: Diagnosis not present

## 2020-11-26 HISTORY — DX: Frequency of micturition: R35.0

## 2020-11-26 HISTORY — DX: Presence of dental prosthetic device (complete) (partial): Z97.2

## 2020-11-26 HISTORY — DX: Cellulitis of right lower limb: L03.115

## 2020-11-26 HISTORY — DX: Presence of spectacles and contact lenses: Z97.3

## 2020-11-26 HISTORY — PX: CYSTOSCOPY W/ URETERAL STENT PLACEMENT: SHX1429

## 2020-11-26 HISTORY — DX: Diarrhea, unspecified: R19.7

## 2020-11-26 HISTORY — DX: Bronchiectasis, uncomplicated: J47.9

## 2020-11-26 SURGERY — CYSTOSCOPY, WITH RETROGRADE PYELOGRAM AND URETERAL STENT INSERTION
Anesthesia: General | Site: Renal | Laterality: Left

## 2020-11-26 MED ORDER — ACETAMINOPHEN 500 MG PO TABS
1000.0000 mg | ORAL_TABLET | Freq: Once | ORAL | Status: AC
  Administered 2020-11-26: 10:00:00 1000 mg via ORAL

## 2020-11-26 MED ORDER — PROPOFOL 10 MG/ML IV BOLUS
INTRAVENOUS | Status: AC
  Filled 2020-11-26: qty 20

## 2020-11-26 MED ORDER — FENTANYL CITRATE (PF) 100 MCG/2ML IJ SOLN
INTRAMUSCULAR | Status: DC | PRN
  Administered 2020-11-26: 25 ug via INTRAVENOUS

## 2020-11-26 MED ORDER — CEFAZOLIN SODIUM-DEXTROSE 2-4 GM/100ML-% IV SOLN
INTRAVENOUS | Status: AC
  Filled 2020-11-26: qty 100

## 2020-11-26 MED ORDER — PROPOFOL 10 MG/ML IV BOLUS
INTRAVENOUS | Status: DC | PRN
  Administered 2020-11-26: 100 mg via INTRAVENOUS

## 2020-11-26 MED ORDER — LACTATED RINGERS IV SOLN: INTRAVENOUS | Status: DC

## 2020-11-26 MED ORDER — AMISULPRIDE (ANTIEMETIC) 5 MG/2ML IV SOLN: 5.0000 mg | Freq: Once | INTRAVENOUS | Status: DC | PRN

## 2020-11-26 MED ORDER — DEXAMETHASONE SODIUM PHOSPHATE 4 MG/ML IJ SOLN
INTRAMUSCULAR | Status: DC | PRN
  Administered 2020-11-26: 4 mg via INTRAVENOUS

## 2020-11-26 MED ORDER — ONDANSETRON HCL 4 MG/2ML IJ SOLN: 4.0000 mg | Freq: Once | INTRAMUSCULAR | Status: DC | PRN

## 2020-11-26 MED ORDER — FENTANYL CITRATE (PF) 100 MCG/2ML IJ SOLN
INTRAMUSCULAR | Status: AC
  Filled 2020-11-26: qty 2

## 2020-11-26 MED ORDER — IOHEXOL 300 MG/ML  SOLN
INTRAMUSCULAR | Status: DC | PRN
  Administered 2020-11-26: 13:00:00 30 mL

## 2020-11-26 MED ORDER — STERILE WATER FOR IRRIGATION IR SOLN
Status: DC | PRN
  Administered 2020-11-26: 3000 mL

## 2020-11-26 MED ORDER — CEFAZOLIN SODIUM-DEXTROSE 2-4 GM/100ML-% IV SOLN
2.0000 g | Freq: Once | INTRAVENOUS | Status: AC
  Administered 2020-11-26: 12:00:00 2 g via INTRAVENOUS

## 2020-11-26 MED ORDER — ONDANSETRON HCL 4 MG/2ML IJ SOLN
INTRAMUSCULAR | Status: DC | PRN
  Administered 2020-11-26: 4 mg via INTRAVENOUS

## 2020-11-26 MED ORDER — FENTANYL CITRATE (PF) 100 MCG/2ML IJ SOLN: 25.0000 ug | INTRAMUSCULAR | Status: DC | PRN

## 2020-11-26 MED ORDER — ACETAMINOPHEN 500 MG PO TABS
ORAL_TABLET | ORAL | Status: AC
  Filled 2020-11-26: qty 2

## 2020-11-26 MED ORDER — LIDOCAINE HCL (CARDIAC) PF 100 MG/5ML IV SOSY
PREFILLED_SYRINGE | INTRAVENOUS | Status: DC | PRN
  Administered 2020-11-26: 60 mg via INTRAVENOUS

## 2020-11-26 SURGICAL SUPPLY — 21 items
BAG DRAIN URO-CYSTO SKYTR STRL (DRAIN) ×2 IMPLANT
BAG DRN UROCATH (DRAIN) ×1
CATH URET 5FR 28IN CONE TIP (BALLOONS)
CATH URET 5FR 28IN OPEN ENDED (CATHETERS) ×2 IMPLANT
CATH URET 5FR 70CM CONE TIP (BALLOONS) IMPLANT
CATH URET DUAL LUMEN 6-10FR 50 (CATHETERS) IMPLANT
CLOTH BEACON ORANGE TIMEOUT ST (SAFETY) ×2 IMPLANT
GLOVE SURG ENC MOIS LTX SZ7.5 (GLOVE) ×2 IMPLANT
GLOVE SURG ENC MOIS LTX SZ8 (GLOVE) IMPLANT
GOWN STRL REUS W/TWL LRG LVL3 (GOWN DISPOSABLE) ×2 IMPLANT
GUIDEWIRE ANG ZIPWIRE 038X150 (WIRE) ×2 IMPLANT
GUIDEWIRE STR DUAL SENSOR (WIRE) ×2 IMPLANT
GUIDEWIRE ZIPWRE .038 STRAIGHT (WIRE) IMPLANT
IV NS IRRIG 3000ML ARTHROMATIC (IV SOLUTION) IMPLANT
KIT TURNOVER CYSTO (KITS) ×2 IMPLANT
MANIFOLD NEPTUNE II (INSTRUMENTS) ×2 IMPLANT
NS IRRIG 500ML POUR BTL (IV SOLUTION) ×2 IMPLANT
PACK CYSTO (CUSTOM PROCEDURE TRAY) ×2 IMPLANT
TUBE CONNECTING 12X1/4 (SUCTIONS) ×2 IMPLANT
TUBING UROLOGY SET (TUBING) IMPLANT
WATER STERILE IRR 3000ML UROMA (IV SOLUTION) ×2 IMPLANT

## 2020-11-26 NOTE — Anesthesia Preprocedure Evaluation (Addendum)
Anesthesia Evaluation  Patient identified by MRN, date of birth, ID band Patient awake    Reviewed: Allergy & Precautions, NPO status , Patient's Chart, lab work & pertinent test results  Airway Mallampati: II  TM Distance: >3 FB Neck ROM: Full    Dental  (+) Missing   Pulmonary former smoker,    Pulmonary exam normal breath sounds clear to auscultation       Cardiovascular negative cardio ROS Normal cardiovascular exam Rhythm:Regular Rate:Normal     Neuro/Psych  Headaches, Essential tremornegative psych ROS   GI/Hepatic negative GI ROS, Neg liver ROS,   Endo/Other  negative endocrine ROS  Renal/GU negative Renal ROS     Musculoskeletal negative musculoskeletal ROS (+)   Abdominal   Peds  Hematology negative hematology ROS (+)   Anesthesia Other Findings LEFT URETEROPELVIC JUNCTIONS OBSTRUCTION  Reproductive/Obstetrics                            Anesthesia Physical Anesthesia Plan  ASA: 2  Anesthesia Plan: General   Post-op Pain Management:    Induction: Intravenous  PONV Risk Score and Plan: 3 and Ondansetron, Dexamethasone and Treatment may vary due to age or medical condition  Airway Management Planned: LMA  Additional Equipment:   Intra-op Plan:   Post-operative Plan: Extubation in OR  Informed Consent: I have reviewed the patients History and Physical, chart, labs and discussed the procedure including the risks, benefits and alternatives for the proposed anesthesia with the patient or authorized representative who has indicated his/her understanding and acceptance.     Dental advisory given  Plan Discussed with: CRNA  Anesthesia Plan Comments:        Anesthesia Quick Evaluation

## 2020-11-26 NOTE — Op Note (Addendum)
Preoperative diagnosis: Left UPJ obstruction Postoperative diagnosis: Same  Procedure: Cystoscopy with bilateral retrograde pyelogram, left ureteral stent placement  Surgeon: Junious Silk  Anesthesia: General  Indication for procedure: Ms. Gerrit Friends knee is an 87 year old female with a history of left greater than right UPJ obstruction.  Over several CTs she has had varying levels of left and right UPJ obstruction.  Some scans its worst and then follow-up scans it improves.  More recently the left UPJ and renal pelvis has become more tense and then she had to go to the emergency room because of pain.  She was brought today for retrogrades and left ureteral stent.  Findings: On exam under anesthesia there is moderate vaginal atrophy and a grade 1 cystocele.  No palpable vaginal mass, no vulvar mass.  Bladder and urethra palpably normal.  On cystoscopy the bladder was unremarkable.  No mucosal lesion, stone or foreign body.  Ureteral orifice ease were in the normal orthotopic position although pulled a little more laterally than normal.  E flux was clear.  Right retrograde pyelogram-this outlined a single ureter single collecting system unit.  There was no filling defect but the ureter was tortuous especially proximally and it look like the kidney had dropped with the right UPJ kinked as it inserted into the renal pelvis.  The renal pelvis was moderately dilated but no significant dilation of the calyces.  Left retrograde pyelogram-this outlined the ureter that coiled all the way around the pelvis took a 360 turn back toward the UPJ and filled a severely dilated renal pelvis.  The right kidney was quite low with the UPJ and renal pelvis midline over the sacrum.  I was able to pass the wire and an open-ended catheter around into the renal pelvis.  When the wire was removed there was a good hydronephrotic drip.  I was able to pass a 6 x 24 cm stent without difficulty.  Description of procedure: After  consent was obtained the patient brought to the operating room.  After adequate anesthesia she was placed lithotomy position and prepped and draped in the usual sterile fashion.  Timeout was performed to confirm the patient and procedure.  Cystoscope was passed per urethra and the bladder inspected.  The right ureteral orifice Cannulated with a 6 [place] open-ended catheter and retrograde injection of contrast was performed.  The left ureteral orifice was then cannulated with open-ended catheter and retrograde injection of contrast was performed.  I then passed an angled Glidewire which coiled around into the renal pelvis without difficulty.  The open-ended catheter was advanced around the curve and into the renal pelvis.  The wire was removed and there was a good hydronephrotic drip.  The urine looked clear.  I then took a sensor wire and advanced that and coiled it in the collecting system.  The open-ended was backed out and over the wire a 6 x 24 cm stent advanced.  The wire was removed with a good coil in the renal pelvis and a good coil in the bladder.  The stent appeared to be draining well.  She was then awakened and taken to cover him in stable condition.  Complications: None  Blood loss: Minimal  Specimens: None  Drains: 6 x 24 cm left ureteral stent  Disposition: Patient stable to PACU-I called and discussed case with Jeani Hawking and went over the procedure, postop care and follow-up.

## 2020-11-26 NOTE — Anesthesia Postprocedure Evaluation (Signed)
Anesthesia Post Note  Patient: Chelsea Garcia  Procedure(s) Performed: CYSTOSCOPY WITH BILATERAL RETROGRADE PYELOGRAM/URETERAL STENT PLACEMENT (Left: Renal)     Patient location during evaluation: PACU Anesthesia Type: General Level of consciousness: awake Pain management: pain level controlled Vital Signs Assessment: post-procedure vital signs reviewed and stable Respiratory status: spontaneous breathing, nonlabored ventilation, respiratory function stable and patient connected to nasal cannula oxygen Cardiovascular status: blood pressure returned to baseline and stable Postop Assessment: no apparent nausea or vomiting Anesthetic complications: no   No notable events documented.  Last Vitals:  Vitals:   11/26/20 1315 11/26/20 1340  BP: 138/62 (!) 146/63  Pulse: 70 73  Resp: 16 15  Temp:  36.8 C  SpO2: 94% 95%    Last Pain:  Vitals:   11/26/20 1335  TempSrc:   PainSc: 0-No pain                 [name]

## 2020-11-26 NOTE — Anesthesia Procedure Notes (Signed)
Procedure Name: LMA Insertion Date/Time: 11/26/2020 11:54 AM Performed by: Georgeanne Nim, CRNA Pre-anesthesia Checklist: Patient identified, Emergency Drugs available, Suction available, Patient being monitored and Timeout performed Patient Re-evaluated:Patient Re-evaluated prior to induction Oxygen Delivery Method: Circle system utilized Preoxygenation: Pre-oxygenation with 100% oxygen Induction Type: IV induction Ventilation: Mask ventilation without difficulty LMA: LMA inserted LMA Size: 3.0 Number of attempts: 1 Placement Confirmation: positive ETCO2, CO2 detector and breath sounds checked- equal and bilateral Tube secured with: Tape Dental Injury: Teeth and Oropharynx as per pre-operative assessment

## 2020-11-26 NOTE — Transfer of Care (Signed)
Immediate Anesthesia Transfer of Care Note  Patient: Chelsea Garcia  Procedure(s) Performed: CYSTOSCOPY WITH BILATERAL RETROGRADE PYELOGRAM/URETERAL STENT PLACEMENT (Left: Renal)  Patient Location: PACU  Anesthesia Type:General  Level of Consciousness: awake, alert , oriented and patient cooperative  Airway & Oxygen Therapy: Patient Spontanous Breathing and Patient connected to nasal cannula oxygen  Post-op Assessment: Report given to RN and Post -op Vital signs reviewed and stable  Post vital signs: Reviewed and stable  Last Vitals:  Vitals Value Taken Time  BP    Temp    Pulse 70 11/26/20 1247  Resp    SpO2 98 % 11/26/20 1247  Vitals shown include unvalidated device data.  Last Pain:  Vitals:   11/26/20 0922  TempSrc: Oral  PainSc: 0-No pain      Patients Stated Pain Goal: 5 (34/37/35 7897)  Complications: No notable events documented.

## 2020-11-26 NOTE — H&P (Signed)
H&P  Chief Complaint: left UPJ obs   History of Present Illness:   Patient has worsening/alternating bilateral UPJ obstruction first picked up on CT scan in 2019 and worse on October 2020 to CT scan. Creatinine remains overall stable at 10/16/2020 1.0. Creatinine was as high as 1.3 and as low as 0.8 over the past few months.   Imaging/staging:  03/19 CT a/p: right extrarenal pelvis left mild to moderate hydro consistent with UPJ. She has splenomegaly.  04/22 CT a/p: left greater than right mild to moderate hydronephrosis consistent with left greater than right UPJ obstruction  06/22 CTAP: right moderate and left mild dilation of renal pelvis consistent with right greater than left UPJ obstruction  10/22 CTAP: Moderate right hydronephrosis consistent with UPJ obstruction, moderate to severe left hydronephrosis with perinephric stranding consistent with left UPJ obstruction. Spleen larger.  11/22 CT right side hydro improved and now only right extrarenal pelvis. Left UPJ obstruction worse with increased dilation. WBC 14.5 (stable) and Cr 1.13 (as high as 1.3 recently). UA bland - no bacteria.   She retired as a Engineer, maintenance ([employer]). She lives with her son and daughter in [place].   She has never had bladder or kidney surgery.   She presented to the emergency room November 20, 2020 with increased left flank pain.  CT as above with worsening left UPJ obstruction and improved right drainage.  She presents today for left ureteral stent.    Past Medical History:  Diagnosis Date   Bronchiectasis [facility])    followed by dr Rayann Heman completed pulmonary rehan 06-02-2020   Cellulitis of right foot    recurrent cellulitis last 7 days finishes ciprofloxacin for on 11-26-2020   Colon polyps    Benign. 2 colonoscopies in past. Due now again.   Complication of anesthesia    "got dizzy in 70's"   Diarrhea    resolved now taking imodium ad prn   Dry eye 11/25/2020   both eyes   Endometriosis    required TAH when young    Essential tremor    since 40's   GERD (gastroesophageal reflux disease)    denis   Glaucoma    started in 40's both eyes   Low back pain    since 40's   Migraine    started in 50's last migraine 10 or 20 yrs ago per pt on 11-25-2020   Osteopenia    2014   Seasonal allergies    to Dust, mold and pollen.   Thrombocytosis 09/2013   hx of resolved follows up with dr Truitt Merle oncology   Tick bites    she likes out door activities, 2 this year (2015)   UPJ (ureteropelvic junction) obstruction 11/25/2020   left   Urinary frequency    Wears dentures    upper   Wears glasses    Past Surgical History:  Procedure Laterality Date   CATARACT EXTRACTION Left 02/07/2013   right 2015   colonscopy     10 or 15 yrs ago per pt on 11-25-2020   INGUINAL HERNIA REPAIR Left 01/09/2020   Procedure: LEFT INGUINAL HERNIA REPAIR WITH MESH;  Surgeon: Erroll Luna, MD;  Location: [place];  Service: General;  Laterality: Left;   INSERTION OF MESH Left 01/09/2020   Procedure: INSERTION OF MESH;  Surgeon: Erroll Luna, MD;  Location: [place];  Service: General;  Laterality: Left;   TOTAL ABDOMINAL HYSTERECTOMY  09/10/1968   TUBAL LIGATION  yrs ago    Home Medications:  No medications prior to admission.   Allergies:  Allergies  Allergen Reactions   Nystatin     Swelling of lips   Bee Pollen Other (See Comments) and Cough    Mill dust, pollen. Mill dust, pollen.   Pollen Extract Cough    Mill dust, pollen.   Albuterol     Increased HR, had to go to the ER   Mangifera Indica Fruit Ext (Mango) [Mangifera Indica] Diarrhea   Mite (D. Farinae) Other (See Comments)    General allergy   Sulfamethoxazole Other (See Comments)    Irritation in eyes  Other Other    Family History  Problem Relation Age of Onset   Thyroid disease Mother    Cancer Father    Cancer Maternal Aunt 80   Social History:  reports that she quit smoking about 59 years  ago. Her smoking use included cigarettes. She has a 8.00 pack-year smoking history. She has never used smokeless tobacco. She reports that she does not currently use alcohol. She reports that she does not use drugs.  ROS: A complete review of systems was performed.  All systems are negative except for pertinent findings as noted. Review of Systems  All other systems reviewed and are negative.   Physical Exam:  Vital signs in last 24 hours:   General:  Alert and oriented, No acute distress HEENT: Normocephalic, atraumatic Cardiovascular: Regular rate and rhythm Lungs: Regular rate and effort Abdomen: Soft, nontender, nondistended, no abdominal masses Back: No CVA tenderness Extremities: No edema Neurologic: Grossly intact  Laboratory Data:  No results found for this or any previous visit (from the past 24 hour(s)). No results found for this or any previous visit (from the past 240 hour(s)). Creatinine: Recent Labs    11/20/20 2339  CREATININE 1.13*    Impression/Assessment:  Left UPJ obstruction, improved right UPJ-  Plan:  I discussed with the patient the nature, potential benefits, risks and alternatives to cystoscopy, left retrograde pyelogram, left ureteral stent placement, including side effects of the proposed treatment, the likelihood of the patient achieving the goals of the procedure, and any potential problems that might occur during the procedure or recuperation.  It also would make sense to do a right retrograde but I would not anticipate placing a stent on this side.  Discussed with patient.  I would then plan to do a diagnostic ureteroscopy early January prior to her follow-up with me January 29, 2021. We could possibly remove the stent then. All questions answered. Patient elects to proceed.   Festus Aloe 11/26/2020

## 2020-11-26 NOTE — Discharge Instructions (Addendum)
Follow-up with Dr. Junious Silk on December 10, 2020 at 2:30 PM for a renal ultrasound - You will see NP Noberto Retort.  Dr. Junious Silk will plan to look back in your bladder and left kidney the first week or 2 January.  Office will call to schedule that appointment.   Post Anesthesia Home Care Instructions  Activity: Get plenty of rest for the remainder of the day. A responsible adult should stay with you for 24 hours following the procedure.  For the next 24 hours, DO NOT: -Drive a car -Paediatric nurse -Drink alcoholic beverages -Take any medication unless instructed by your physician -Make any legal decisions or sign important papers.  Meals: Start with liquid foods such as gelatin or soup. Progress to regular foods as tolerated. Avoid greasy, spicy, heavy foods. If nausea and/or vomiting occur, drink only clear liquids until the nausea and/or vomiting subsides. Call your physician if vomiting continues.  Special Instructions/Symptoms: Your throat may feel dry or sore from the anesthesia or the breathing tube placed in your throat during surgery. If this causes discomfort, gargle with warm salt water. The discomfort should disappear within 24 hours.   [facility] [phone] Post Ureteroscopy With or Without Stent Instructions  Definitions:  Ureter: The duct that transports urine from the kidney to the bladder. Stent:   A plastic hollow tube that is placed into the ureter, from the kidney to the bladder to prevent the ureter from swelling shut.  GENERAL INSTRUCTIONS:  Despite the fact that no skin incisions were used, the area around the ureter and bladder is raw and irritated. The stent is a foreign body which will further irritate the bladder wall. This irritation is manifested by increased frequency of urination, both day and night, and by an increase in the urge to urinate. In some, the urge to urinate is present almost always. Sometimes the urge is strong enough that you  may not be able to stop yourself from urinating. The only real cure is to remove the stent and then give time for the bladder wall to heal which can't be done until the danger of the ureter swelling shut has passed, which varies.  You may see some blood in your urine while the stent is in place and a few days afterwards. Do not be alarmed, even if the urine was clear for a while. Get off your feet and drink lots of fluids until clearing occurs. If you start to pass clots or don't improve, call us.  DIET: You may return to your normal diet immediately. Because of the raw surface of your bladder, alcohol, spicy foods, acid type foods and drinks with caffeine may cause irritation or frequency and should be used in moderation. To keep your urine flowing freely and to avoid constipation, drink plenty of fluids during the day ( 8-10 glasses ). Tip: Avoid cranberry juice because it is very acidic.  ACTIVITY: Your physical activity doesn't need to be restricted. However, if you are very active, you may see some blood in your urine. We suggest that you reduce your activity under these circumstances until the bleeding has stopped.  BOWELS: It is important to keep your bowels regular during the postoperative period. Straining with bowel movements can cause bleeding. A bowel movement every other day is reasonable. Use a mild laxative if needed, such as Milk of Magnesia 2-3 tablespoons, or 2 Dulcolax tablets. Call if you continue to have problems. If you have been taking narcotics for pain, before, during or after  your surgery, you may be constipated. Take a laxative if necessary.   MEDICATION: You should resume your pre-surgery medications unless told not to. In addition you will often be given an antibiotic to prevent infection. These should be taken as prescribed until the bottles are finished unless you are having an unusual reaction to one of the drugs.  PROBLEMS YOU SHOULD REPORT TO [place]: Fevers over 100.5  Fahrenheit. Heavy bleeding, or clots ( See above notes about blood in urine ). Inability to urinate. Drug reactions ( hives, rash, nausea, vomiting, diarrhea ). Severe burning or pain with urination that is not improving.

## 2020-11-27 ENCOUNTER — Encounter (HOSPITAL_BASED_OUTPATIENT_CLINIC_OR_DEPARTMENT_OTHER): Payer: Self-pay | Admitting: Urology

## 2020-12-01 DIAGNOSIS — M419 Scoliosis, unspecified: Secondary | ICD-10-CM | POA: Diagnosis not present

## 2020-12-01 DIAGNOSIS — M40209 Unspecified kyphosis, site unspecified: Secondary | ICD-10-CM | POA: Diagnosis not present

## 2020-12-02 ENCOUNTER — Emergency Department (HOSPITAL_COMMUNITY)
Admission: EM | Admit: 2020-12-02 | Discharge: 2020-12-02 | Disposition: A | Discharge status: Home / Self Care | Payer: Medicare Other | Attending: Emergency Medicine | Admitting: Emergency Medicine

## 2020-12-02 ENCOUNTER — Encounter (HOSPITAL_COMMUNITY): Payer: Self-pay

## 2020-12-02 ENCOUNTER — Emergency Department (HOSPITAL_BASED_OUTPATIENT_CLINIC_OR_DEPARTMENT_OTHER)
Admit: 2020-12-02 | Discharge: 2020-12-02 | Disposition: A | Discharge status: Home / Self Care | Payer: Medicare Other | Attending: Emergency Medicine | Admitting: Emergency Medicine

## 2020-12-02 DIAGNOSIS — Z7982 Long term (current) use of aspirin: Secondary | ICD-10-CM | POA: Diagnosis not present

## 2020-12-02 DIAGNOSIS — R609 Edema, unspecified: Secondary | ICD-10-CM | POA: Diagnosis not present

## 2020-12-02 DIAGNOSIS — M79605 Pain in left leg: Secondary | ICD-10-CM | POA: Diagnosis not present

## 2020-12-02 DIAGNOSIS — Z87891 Personal history of nicotine dependence: Secondary | ICD-10-CM | POA: Diagnosis not present

## 2020-12-02 LAB — CBC WITH DIFFERENTIAL/PLATELET
Abs Immature Granulocytes: 0.8 10*3/uL — ABNORMAL HIGH (ref 0.00–0.07)
Basophils Absolute: 0.1 10*3/uL (ref 0.0–0.1)
Basophils Relative: 1 %
Eosinophils Absolute: 0.1 10*3/uL (ref 0.0–0.5)
Eosinophils Relative: 1 %
HCT: 39 % (ref 36.0–46.0)
Hemoglobin: 12 g/dL (ref 12.0–15.0)
Immature Granulocytes: 6 %
Lymphocytes Relative: 4 %
Lymphs Abs: 0.6 10*3/uL — ABNORMAL LOW (ref 0.7–4.0)
MCH: 28.5 pg (ref 26.0–34.0)
MCHC: 30.8 g/dL (ref 30.0–36.0)
MCV: 92.6 fL (ref 80.0–100.0)
Monocytes Absolute: 0.5 10*3/uL (ref 0.1–1.0)
Monocytes Relative: 4 %
Neutro Abs: 10.5 10*3/uL — ABNORMAL HIGH (ref 1.7–7.7)
Neutrophils Relative %: 84 %
Platelets: 339 10*3/uL (ref 150–400)
RBC: 4.21 MIL/uL (ref 3.87–5.11)
RDW: 18.2 % — ABNORMAL HIGH (ref 11.5–15.5)
WBC: 12.6 10*3/uL — ABNORMAL HIGH (ref 4.0–10.5)
nRBC: 0 % (ref 0.0–0.2)

## 2020-12-02 LAB — COMPREHENSIVE METABOLIC PANEL
ALT: 10 U/L (ref 0–44)
AST: 13 U/L — ABNORMAL LOW (ref 15–41)
Albumin: 4.3 g/dL (ref 3.5–5.0)
Alkaline Phosphatase: 44 U/L (ref 38–126)
Anion gap: 7 (ref 5–15)
BUN: 25 mg/dL — ABNORMAL HIGH (ref 8–23)
CO2: 25 mmol/L (ref 22–32)
Calcium: 9.2 mg/dL (ref 8.9–10.3)
Chloride: 108 mmol/L (ref 98–111)
Creatinine, Ser: 0.86 mg/dL (ref 0.44–1.00)
GFR, Estimated: >60 mL/min (ref >60)
Glucose, Bld: 109 mg/dL — ABNORMAL HIGH (ref 70–99)
Potassium: 4.2 mmol/L (ref 3.5–5.1)
Sodium: 140 mmol/L (ref 135–145)
Total Bilirubin: 0.8 mg/dL (ref 0.3–1.2)
Total Protein: 7.1 g/dL (ref 6.5–8.1)

## 2020-12-02 MED ORDER — ACETAMINOPHEN 325 MG PO TABS
650.0000 mg | ORAL_TABLET | Freq: Once | ORAL | Status: AC
  Administered 2020-12-02: 650 mg via ORAL
  Filled 2020-12-02: qty 2

## 2020-12-02 NOTE — ED Notes (Signed)
Ambulated with pt down hallway, assisted with one arm. Gait remained steady, reported very little pain.

## 2020-12-02 NOTE — ED Provider Notes (Signed)
[place] DEPT Provider Note   CSN: [id number] Arrival date & time: 12/02/20  9562     History Chief Complaint  Patient presents with   Leg Pain    Chelsea Garcia is a 87 y.o. female presenting for evaluation of left leg pain.  Patient states since last night she has had pain in her left medial leg.  Pain is present when she ambulates, no pain at rest.  She describes it as an bruise-like pain, not a pain she is experienced before.  No pain on the right side.  No fall, trauma, or injury.  No new numbness or tingling in her foot.  No fevers, chest pain, shortness of breath, abdominal pain, urinary symptoms.  She has not taken anything for pain.  She is concerned that she has a blood clot.  She did recently have a cystoscopy, 6 days ago.  She is not anticoagulated, takes aspirin daily.  History of essential thrombocytosis followed by Dr. Burr Medico with oncology.  Patient was taken off her hydroxyurea 1 month ago.  HPI     Past Medical History:  Diagnosis Date   Bronchiectasis [facility])    followed by dr Rayann Heman completed pulmonary rehan 06-02-2020   Cellulitis of right foot    recurrent cellulitis last 7 days finishes ciprofloxacin for on 11-26-2020   Colon polyps    Benign. 2 colonoscopies in past. Due now again.   Complication of anesthesia    "got dizzy in 70's"   Diarrhea    resolved now taking imodium ad prn   Dry eye 11/25/2020   both eyes   Endometriosis    required TAH when young   Essential tremor    since 40's   GERD (gastroesophageal reflux disease)    denis   Glaucoma    started in 40's both eyes   Low back pain    since 40's   Migraine    started in 50's last migraine 10 or 20 yrs ago per pt on 11-25-2020   Osteopenia    2014   Seasonal allergies    to Dust, mold and pollen.   Thrombocytosis 09/2013   hx of resolved follows up with dr Truitt Merle oncology   Tick bites    she likes out door activities, 2 this year (2015)   UPJ  (ureteropelvic junction) obstruction 11/25/2020   left   Urinary frequency    Wears dentures    upper   Wears glasses     Patient Active Problem List   Diagnosis Date Noted   Bronchiectasis without complication ([place]) 13/08/6576   Laryngopharyngeal reflux (LPR) 08/20/2019   Chronic cough 08/19/2019   Globus pharyngeus 07/16/2019   Bilateral hearing loss 11/01/2017   Deviated septum 09/21/2016   Seasonal allergic rhinitis 09/21/2016   Essential thrombocytosis ([place]) 10/01/2013    Past Surgical History:  Procedure Laterality Date   CATARACT EXTRACTION Left 02/07/2013   right 2015   colonscopy     10 or 15 yrs ago per pt on 11-25-2020   CYSTOSCOPY W/ URETERAL STENT PLACEMENT Left 11/26/2020   Procedure: CYSTOSCOPY WITH BILATERAL RETROGRADE PYELOGRAM/URETERAL STENT PLACEMENT;  Surgeon: Festus Aloe, MD;  Location: [facility];  Service: Urology;  Laterality: Left;  ONLY NEEDS 30 MIN   INGUINAL HERNIA REPAIR Left 01/09/2020   Procedure: LEFT INGUINAL HERNIA REPAIR WITH MESH;  Surgeon: Erroll Luna, MD;  Location: [place];  Service: General;  Laterality: Left;   INSERTION OF MESH  Left 01/09/2020   Procedure: INSERTION OF MESH;  Surgeon: Erroll Luna, MD;  Location: [place];  Service: General;  Laterality: Left;   TOTAL ABDOMINAL HYSTERECTOMY  09/10/1968   TUBAL LIGATION     yrs ago     OB History   No obstetric history on file.     Family History  Problem Relation Age of Onset   Thyroid disease Mother    Cancer Father    Cancer Maternal Aunt 80    Social History   Tobacco Use   Smoking status: Former    Packs/day: 1.00    Years: 8.00    Pack years: 8.00    Types: Cigarettes    Quit date: 07/01/1961    Years since quitting: 59.4   Smokeless tobacco: Never  Vaping Use   Vaping Use: Never used  Substance Use Topics   Alcohol use: Not Currently   Drug use: No    Home Medications Prior to Admission  medications   Medication Sig Start Date End Date Taking? Authorizing Provider  acetaminophen (TYLENOL) 500 MG tablet Take 500 mg by mouth every 6 (six) hours as needed for moderate pain.    [provider]  aspirin EC 81 MG tablet Take 81 mg by mouth daily. Swallow whole.    [provider]  bismuth subsalicylate (PEPTO BISMOL) 262 MG/15ML suspension Take 30 mLs by mouth every 6 (six) hours as needed.    [provider]  brimonidine (ALPHAGAN) 0.2 % ophthalmic solution Place 1 drop into both eyes 2 (two) times daily. 08/17/20   [provider]  calcium carbonate (OS-CAL) 1250 (500 Ca) MG chewable tablet Chew 1 tablet by mouth daily.    [provider]  ciprofloxacin (CIPRO) 500 MG tablet Take 500 mg by mouth 3 (three) times daily. For right foot cellulitis finishes 11-26-2020    [provider]  Latanoprostene Bunod (VYZULTA) 0.024 % SOLN Apply 1 drop to eye every evening. Both eyes    [provider]  loperamide (IMODIUM A-D) 2 MG tablet Take 2 mg by mouth 4 (four) times daily as needed for diarrhea or loose stools.    [provider]  Multiple Vitamin (MULTI VITAMIN) TABS Take 2 tablets by mouth daily.    [provider]  Netarsudil Dimesylate (RHOPRESSA) 0.02 % SOLN Apply 1 drop to eye every evening. Both eyes    [provider]  ondansetron (ZOFRAN-ODT) 8 MG disintegrating tablet Take 1 tablet (8 mg total) by mouth every 8 (eight) hours as needed for nausea or vomiting. Patient taking differently: Take 8 mg by mouth every 8 (eight) hours as needed for nausea or vomiting. Has not taken has on hand 76/72/09   Delora Fuel, MD  oxyCODONE-acetaminophen (PERCOCET) 5-325 MG tablet Take 1 tablet by mouth every 4 (four) hours as needed for moderate pain. Patient taking differently: Take 1 tablet by mouth every 4 (four) hours as needed for moderate pain. Has on hand has not taken 47/09/62   Delora Fuel, MD  Polyethyl  Glycol-Propyl Glycol (SYSTANE OP) Apply to eye. Uses eye drops prn for dry eye    [provider]  Grant Ruts INHUB 100-50 MCG/ACT AEPB INHALE 1 PUFF INTO THE LUNGS IN THE MORNING AND AT BEDTIME 10/21/20   Margaretha Seeds, MD    Allergies    Nystatin, Bee pollen, Pollen extract, Albuterol, Mangifera indica fruit ext (mango) [mangifera indica], Mite (d. farinae), and Sulfamethoxazole  Review of Systems   Review  of Systems  Musculoskeletal:  Positive for myalgias.  Hematological:  Does not bruise/bleed easily.  All other systems reviewed and are negative.  Physical Exam Updated Vital Signs BP (!) 126/58   Pulse 61   Temp 98 F (36.7 C) (Oral)   Resp 17   SpO2 94%   Physical Exam Vitals and nursing note reviewed.  Constitutional:      General: She is not in acute distress.    Appearance: Normal appearance.     Comments: Resting in the bed in NAD  HENT:     Head: Normocephalic and atraumatic.  Eyes:     Conjunctiva/sclera: Conjunctivae normal.     Pupils: Pupils are equal, round, and reactive to light.  Cardiovascular:     Rate and Rhythm: Normal rate and regular rhythm.     Pulses: Normal pulses.  Pulmonary:     Effort: Pulmonary effort is normal. No respiratory distress.     Breath sounds: Normal breath sounds. No wheezing.     Comments: Speaking in full sentences.  Clear lung sounds in all fields. Abdominal:     General: There is no distension.     Palpations: Abdomen is soft. There is no mass.     Tenderness: There is no abdominal tenderness. There is no guarding or rebound.  Musculoskeletal:        General: Normal range of motion.     Cervical back: Normal range of motion and neck supple.     Comments: No deformity of the left leg.  No signs of injury.  Varicose veins noted. No tenderness palpation of the calf or thigh.  No tenderness palpation of the joint line.  Pedal pulses 2+.  Able to perform straight leg raise without pain or difficulty.  Full active  range of motion of the knee without difficulty or pain. Negative homans  Skin:    General: Skin is warm and dry.     Capillary Refill: Capillary refill takes less than 2 seconds.  Neurological:     Mental Status: She is alert and oriented to person, place, and time.  Psychiatric:        Mood and Affect: Mood and affect normal.        Speech: Speech normal.        Behavior: Behavior normal.    ED Results / Procedures / Treatments   Labs (all labs ordered are listed, but only abnormal results are displayed) Labs Reviewed  CBC WITH DIFFERENTIAL/PLATELET - Abnormal; Notable for the following components:      Result Value   WBC 12.6 (*)    RDW 18.2 (*)    Neutro Abs 10.5 (*)    Lymphs Abs 0.6 (*)    Abs Immature Granulocytes 0.80 (*)    All other components within normal limits  COMPREHENSIVE METABOLIC PANEL - Abnormal; Notable for the following components:   Glucose, Bld 109 (*)    BUN 25 (*)    AST 13 (*)    All other components within normal limits    EKG None  Radiology VAS [place] LOWER EXTREMITY VENOUS (DVT) (7a-7p)  Result Date: 12/02/2020  Lower Venous DVT Study Patient Name:  Chelsea Garcia  Date of Exam:   12/02/2020 Medical Rec #: [id number]    Accession #:    [id number] Date of Birth: [birth year]     Patient Gender: F Patient Age:   32 years Exam [facility]  [facility] Procedure:      VAS [place] LOWER EXTREMITY  VENOUS (DVT) Referring Phys: Rodolfo Gaster --------------------------------------------------------------------------------  Indications: Edema.  Limitations: Poor ultrasound/tissue interface and position. Comparison Study: No prior study Performing Technologist: Maudry Mayhew MHA, RDMS, RVT, RDCS  Examination Guidelines: A complete evaluation includes B-mode imaging, spectral Doppler, color Doppler, and power Doppler as needed of all accessible portions of each vessel. Bilateral testing is considered an integral part of a complete examination. Limited  examinations for reoccurring indications may be performed as noted. The reflux portion of the exam is performed with the patient in reverse Trendelenburg.  Right Technical Findings: Not visualized segments include CFV.  +---------+---------------+---------+-----------+----------+--------------+ LEFT     CompressibilityPhasicitySpontaneityPropertiesThrombus Aging +---------+---------------+---------+-----------+----------+--------------+ CFV      Full           Yes      Yes                                 +---------+---------------+---------+-----------+----------+--------------+ SFJ      Full                                                        +---------+---------------+---------+-----------+----------+--------------+ FV Prox  Full                                                        +---------+---------------+---------+-----------+----------+--------------+ FV Mid   Full                                                        +---------+---------------+---------+-----------+----------+--------------+ FV DistalFull                                                        +---------+---------------+---------+-----------+----------+--------------+ PFV      Full                                                        +---------+---------------+---------+-----------+----------+--------------+ POP      Full           Yes      Yes                                 +---------+---------------+---------+-----------+----------+--------------+ PTV      Full                                                        +---------+---------------+---------+-----------+----------+--------------+ PERO     Full                                                        +---------+---------------+---------+-----------+----------+--------------+  Summary: LEFT: - There is no evidence of deep vein thrombosis in the lower extremity.  - No cystic structure found in the  popliteal fossa.  *See table(s) above for measurements and observations.    Preliminary     Procedures Procedures   Medications Ordered in ED Medications  acetaminophen (TYLENOL) tablet 650 mg (650 mg Oral Given 12/02/20 1148)    ED Course  I have reviewed the triage vital signs and the nursing notes.  Pertinent labs & imaging results that were available during my care of the patient were reviewed by me and considered in my medical decision making (see chart for details).    MDM Rules/Calculators/A&P                           Patient presenting for evaluation of left leg pain.  On exam, patient appears nontoxic.  She is neurovascular intact.  No trauma or injury, doubt fracture dislocation.  Patient is most worried about a DVT.  Ultrasound ordered.  Also obtain basic blood work.  Labs interpreted by me, overall baseline.  Platelets are improved from previous.  Ultrasound is negative for DVT.  Discussed findings with patient.  Patient was able to ambulate without significant difficulty.  As pain is present only with certain movements, likely muscular.  Discussed with attending, Dr. Doren Custard evaluated the patient.  Patient to follow-up with her PCP if pain persist.  At this time, patient appears safe for discharge.  Return precautions given.  Patient states she understands and agrees to plan  Final Clinical Impression(s) / ED Diagnoses Final diagnoses:  Left leg pain    Rx / DC Orders ED Discharge Orders     None        Franchot Heidelberg, PA-C 12/02/20 1256    Godfrey Pick, MD 12/03/20 1312

## 2020-12-02 NOTE — Progress Notes (Signed)
Left lower extremity venous duplex completed. Refer to "CV Proc" under chart review to view preliminary results.  12/02/2020 11:45 AM Kelby Aline., MHA, RVT, RDCS, RDMS

## 2020-12-02 NOTE — ED Triage Notes (Signed)
Pt arrived via POV, c/o left leg pain that started suddenly this morning, waking her from sleep. Denies any injury.

## 2020-12-02 NOTE — Discharge Instructions (Addendum)
Your ultrasound was negative for DVT.  Your blood work was overall reassuring. Use Tylenol as needed for pain. You may use muscle creams such as Salonpas, icy hot, Bengay, Biofreeze to help with pain control. If you continue to have pain throughout the weekend, I recommend you follow-up with a primary care doctor for recheck early next week. Return to the emergency room if you develop fever, redness or skin changes of your leg, new numbness of your leg, inability to walk, or any new, worsening, or concerning symptoms.

## 2020-12-02 NOTE — ED Notes (Signed)
Pt has a purewick on  

## 2020-12-06 ENCOUNTER — Emergency Department (HOSPITAL_BASED_OUTPATIENT_CLINIC_OR_DEPARTMENT_OTHER)
Admission: EM | Admit: 2020-12-06 | Discharge: 2020-12-07 | Disposition: A | Discharge status: Home / Self Care | Payer: Medicare Other | Attending: Emergency Medicine | Admitting: Emergency Medicine

## 2020-12-06 ENCOUNTER — Other Ambulatory Visit: Payer: Self-pay

## 2020-12-06 ENCOUNTER — Encounter (HOSPITAL_BASED_OUTPATIENT_CLINIC_OR_DEPARTMENT_OTHER): Payer: Self-pay

## 2020-12-06 DIAGNOSIS — Z87891 Personal history of nicotine dependence: Secondary | ICD-10-CM | POA: Diagnosis not present

## 2020-12-06 DIAGNOSIS — L03116 Cellulitis of left lower limb: Secondary | ICD-10-CM | POA: Diagnosis not present

## 2020-12-06 DIAGNOSIS — Z7982 Long term (current) use of aspirin: Secondary | ICD-10-CM | POA: Insufficient documentation

## 2020-12-06 DIAGNOSIS — M25472 Effusion, left ankle: Secondary | ICD-10-CM | POA: Diagnosis not present

## 2020-12-06 DIAGNOSIS — M7989 Other specified soft tissue disorders: Secondary | ICD-10-CM | POA: Diagnosis present

## 2020-12-06 NOTE — ED Triage Notes (Addendum)
Pt reports recent treatment for cellulitis to her LLE. Just completed 2 separate treatments of 500mg  cephalexin cellulitis to RLE, last prescription was filled 11/17/20  Pt called her PCP and he sent her here for further evaluation

## 2020-12-07 DIAGNOSIS — L03116 Cellulitis of left lower limb: Secondary | ICD-10-CM | POA: Diagnosis not present

## 2020-12-07 LAB — BASIC METABOLIC PANEL
Anion gap: 7 (ref 5–15)
BUN: 27 mg/dL — ABNORMAL HIGH (ref 8–23)
CO2: 26 mmol/L (ref 22–32)
Calcium: 9.6 mg/dL (ref 8.9–10.3)
Chloride: 107 mmol/L (ref 98–111)
Creatinine, Ser: 0.94 mg/dL (ref 0.44–1.00)
GFR, Estimated: 59 mL/min — ABNORMAL LOW (ref >60)
Glucose, Bld: 107 mg/dL — ABNORMAL HIGH (ref 70–99)
Potassium: 4.3 mmol/L (ref 3.5–5.1)
Sodium: 140 mmol/L (ref 135–145)

## 2020-12-07 LAB — CBC WITH DIFFERENTIAL/PLATELET
Abs Immature Granulocytes: 0.5 10*3/uL — ABNORMAL HIGH (ref 0.00–0.07)
Basophils Absolute: 0 10*3/uL (ref 0.0–0.1)
Basophils Relative: 0 %
Eosinophils Absolute: 0.1 10*3/uL (ref 0.0–0.5)
Eosinophils Relative: 1 %
HCT: 35.7 % — ABNORMAL LOW (ref 36.0–46.0)
Hemoglobin: 11.1 g/dL — ABNORMAL LOW (ref 12.0–15.0)
Lymphocytes Relative: 9 %
Lymphs Abs: 1 10*3/uL (ref 0.7–4.0)
MCH: 28 pg (ref 26.0–34.0)
MCHC: 31.1 g/dL (ref 30.0–36.0)
MCV: 89.9 fL (ref 80.0–100.0)
Metamyelocytes Relative: 3 %
Monocytes Absolute: 0.3 10*3/uL (ref 0.1–1.0)
Monocytes Relative: 3 %
Myelocytes: 2 %
Neutro Abs: 8.9 10*3/uL — ABNORMAL HIGH (ref 1.7–7.7)
Neutrophils Relative %: 82 %
Platelet Morphology: NORMAL
Platelets: 309 10*3/uL (ref 150–400)
RBC: 3.97 MIL/uL (ref 3.87–5.11)
RDW: 18.1 % — ABNORMAL HIGH (ref 11.5–15.5)
WBC: 10.8 10*3/uL — ABNORMAL HIGH (ref 4.0–10.5)
nRBC: 0 % (ref 0.0–0.2)

## 2020-12-07 MED ORDER — DOXYCYCLINE HYCLATE 100 MG PO TABS
100.0000 mg | ORAL_TABLET | Freq: Once | ORAL | Status: AC
  Administered 2020-12-07: 01:00:00 100 mg via ORAL
  Filled 2020-12-07: qty 1

## 2020-12-07 MED ORDER — DOXYCYCLINE HYCLATE 100 MG PO CAPS: 100.0000 mg | ORAL_CAPSULE | Freq: Two times a day (BID) | ORAL | 0 refills | Status: DC

## 2020-12-07 NOTE — ED Provider Notes (Signed)
[facility] EMERGENCY DEPT Provider Note   CSN: [id number] Arrival date & time: 12/06/20  2309     History Chief Complaint  Patient presents with   Cellulitis    Chelsea Garcia is a 87 y.o. female.  HPI     This is an 87 year old female with a history of recurrent cellulitis, bronchiectasis, reflux who presents with swelling and redness of the left lower extremity.  Patient reports recent treatment for cellulitis of the right lower extremity with 2 courses of Keflex.  Patient states that she recently had some pain in her left lower extremity.  She was seen and evaluated on 11/23 and had negative DVT study of that leg.  However, since that time she has noted increasing redness and swelling of the lower leg.  She has not had any fevers.  Redness however has progressed proximally and this concerned her.  She denies any ongoing leg pain, foot numbness.  She called her primary physician and they encouraged her to be evaluated.  Past Medical History:  Diagnosis Date   Bronchiectasis [facility])    followed by dr Rayann Heman completed pulmonary rehan 06-02-2020   Cellulitis of right foot    recurrent cellulitis last 7 days finishes ciprofloxacin for on 11-26-2020   Colon polyps    Benign. 2 colonoscopies in past. Due now again.   Complication of anesthesia    "got dizzy in 70's"   Diarrhea    resolved now taking imodium ad prn   Dry eye 11/25/2020   both eyes   Endometriosis    required TAH when young   Essential tremor    since 40's   GERD (gastroesophageal reflux disease)    denis   Glaucoma    started in 40's both eyes   Low back pain    since 40's   Migraine    started in 50's last migraine 10 or 20 yrs ago per pt on 11-25-2020   Osteopenia    2014   Seasonal allergies    to Dust, mold and pollen.   Thrombocytosis 09/2013   hx of resolved follows up with dr Truitt Merle oncology   Tick bites    she likes out door activities, 2 this year (2015)   UPJ  (ureteropelvic junction) obstruction 11/25/2020   left   Urinary frequency    Wears dentures    upper   Wears glasses     Patient Active Problem List   Diagnosis Date Noted   Bronchiectasis without complication ([place]) 22/29/7989   Laryngopharyngeal reflux (LPR) 08/20/2019   Chronic cough 08/19/2019   Globus pharyngeus 07/16/2019   Bilateral hearing loss 11/01/2017   Deviated septum 09/21/2016   Seasonal allergic rhinitis 09/21/2016   Essential thrombocytosis ([place]) 10/01/2013    Past Surgical History:  Procedure Laterality Date   CATARACT EXTRACTION Left 02/07/2013   right 2015   colonscopy     10 or 15 yrs ago per pt on 11-25-2020   CYSTOSCOPY W/ URETERAL STENT PLACEMENT Left 11/26/2020   Procedure: CYSTOSCOPY WITH BILATERAL RETROGRADE PYELOGRAM/URETERAL STENT PLACEMENT;  Surgeon: Festus Aloe, MD;  Location: [facility];  Service: Urology;  Laterality: Left;  ONLY NEEDS 30 MIN   INGUINAL HERNIA REPAIR Left 01/09/2020   Procedure: LEFT INGUINAL HERNIA REPAIR WITH MESH;  Surgeon: Erroll Luna, MD;  Location: [facility];  Service: General;  Laterality: Left;   INSERTION OF MESH Left 01/09/2020   Procedure: INSERTION OF MESH;  Surgeon: Erroll Luna, MD;  Location: [place] [facility];  Service: General;  Laterality: Left;   TOTAL ABDOMINAL HYSTERECTOMY  09/10/1968   TUBAL LIGATION     yrs ago     OB History   No obstetric history on file.     Family History  Problem Relation Age of Onset   Thyroid disease Mother    Cancer Father    Cancer Maternal Aunt 80    Social History   Tobacco Use   Smoking status: Former    Packs/day: 1.00    Years: 8.00    Pack years: 8.00    Types: Cigarettes    Quit date: 07/01/1961    Years since quitting: 59.4   Smokeless tobacco: Never  Vaping Use   Vaping Use: Never used  Substance Use Topics   Alcohol use: Not Currently   Drug use: No    Home Medications Prior to Admission  medications   Medication Sig Start Date End Date Taking? Authorizing Provider  doxycycline (VIBRAMYCIN) 100 MG capsule Take 1 capsule (100 mg total) by mouth 2 (two) times daily. 12/07/20  Yes Sheryll Dymek, Barbette Hair, MD  acetaminophen (TYLENOL) 500 MG tablet Take 500 mg by mouth every 6 (six) hours as needed for moderate pain.    [provider]  aspirin EC 81 MG tablet Take 81 mg by mouth daily. Swallow whole.    [provider]  bismuth subsalicylate (PEPTO BISMOL) 262 MG/15ML suspension Take 30 mLs by mouth every 6 (six) hours as needed.    [provider]  brimonidine (ALPHAGAN) 0.2 % ophthalmic solution Place 1 drop into both eyes 2 (two) times daily. 08/17/20   [provider]  calcium carbonate (OS-CAL) 1250 (500 Ca) MG chewable tablet Chew 1 tablet by mouth daily.    [provider]  ciprofloxacin (CIPRO) 500 MG tablet Take 500 mg by mouth 3 (three) times daily. For right foot cellulitis finishes 11-26-2020    [provider]  Latanoprostene Bunod (VYZULTA) 0.024 % SOLN Apply 1 drop to eye every evening. Both eyes    [provider]  loperamide (IMODIUM A-D) 2 MG tablet Take 2 mg by mouth 4 (four) times daily as needed for diarrhea or loose stools.    [provider]  Multiple Vitamin (MULTI VITAMIN) TABS Take 2 tablets by mouth daily.    [provider]  Netarsudil Dimesylate (RHOPRESSA) 0.02 % SOLN Apply 1 drop to eye every evening. Both eyes    [provider]  ondansetron (ZOFRAN-ODT) 8 MG disintegrating tablet Take 1 tablet (8 mg total) by mouth every 8 (eight) hours as needed for nausea or vomiting. Patient taking differently: Take 8 mg by mouth every 8 (eight) hours as needed for nausea or vomiting. Has not taken has on hand 73/53/29   Delora Fuel, MD  oxyCODONE-acetaminophen (PERCOCET) 5-325 MG tablet Take 1 tablet by mouth every 4 (four) hours as needed for moderate pain. Patient taking differently:  Take 1 tablet by mouth every 4 (four) hours as needed for moderate pain. Has on hand has not taken 92/42/68   Delora Fuel, MD  Polyethyl Glycol-Propyl Glycol (SYSTANE OP) Apply to eye. Uses eye drops prn for dry eye    [provider]  Grant Ruts INHUB 100-50 MCG/ACT AEPB INHALE 1 PUFF INTO THE LUNGS IN THE MORNING AND AT BEDTIME 10/21/20   Margaretha Seeds, MD    Allergies    Nystatin, Bee pollen, Pollen extract, Albuterol, Mangifera indica fruit ext (mango) [mangifera indica], Mite (  d. farinae), and Sulfamethoxazole  Review of Systems   Review of Systems  Constitutional:  Negative for fever.  Respiratory:  Negative for shortness of breath.   Cardiovascular:  Positive for leg swelling. Negative for chest pain.  Skin:  Positive for color change. Negative for wound.  All other systems reviewed and are negative.  Physical Exam Updated Vital Signs BP (!) 119/57   Pulse 67   Temp 98 F (36.7 C) (Oral)   Resp 18   SpO2 95%   Physical Exam Vitals and nursing note reviewed.  Constitutional:      Appearance: She is well-developed. She is not ill-appearing.     Comments: Elderly, nontoxic  HENT:     Head: Normocephalic and atraumatic.     Nose: Nose normal.     Mouth/Throat:     Mouth: Mucous membranes are moist.  Eyes:     Pupils: Pupils are equal, round, and reactive to light.  Cardiovascular:     Rate and Rhythm: Normal rate and regular rhythm.     Heart sounds: Normal heart sounds.  Pulmonary:     Effort: Pulmonary effort is normal. No respiratory distress.     Breath sounds: No wheezing.  Abdominal:     Palpations: Abdomen is soft.  Musculoskeletal:     Cervical back: Neck supple.     Comments: Blanching erythema of the left lower extremity from just distal to the ankle into the mid lower leg, associated swelling noted, 2+ DP pulse, no calf tenderness or swelling  Skin:    General: Skin is warm and dry.  Neurological:     Mental Status: She is alert and oriented  to person, place, and time.  Psychiatric:        Mood and Affect: Mood normal.    ED Results / Procedures / Treatments   Labs (all labs ordered are listed, but only abnormal results are displayed) Labs Reviewed  CBC WITH DIFFERENTIAL/PLATELET - Abnormal; Notable for the following components:      Result Value   WBC 10.8 (*)    Hemoglobin 11.1 (*)    HCT 35.7 (*)    RDW 18.1 (*)    All other components within normal limits  BASIC METABOLIC PANEL - Abnormal; Notable for the following components:   Glucose, Bld 107 (*)    BUN 27 (*)    GFR, Estimated 59 (*)    All other components within normal limits    EKG None  Radiology No results found.  Procedures Procedures   Medications Ordered in ED Medications  doxycycline (VIBRA-TABS) tablet 100 mg (has no administration in time range)    ED Course  I have reviewed the triage vital signs and the nursing notes.  Pertinent labs & imaging results that were available during my care of the patient were reviewed by me and considered in my medical decision making (see chart for details).    MDM Rules/Calculators/A&P                           Patient presents with redness and swelling left lower extremity.  Had DVT study several days ago that was negative.  She has since developed redness consistent with cellulitis.  History of recurrent cellulitis of the right lower extremity.  Recently finished 2 courses of Keflex.  She is nontoxic and afebrile.  Vital signs are reassuring.  She has blanching redness of the left lower extremity.  High suspicion for  recurrent cellulitis.  Labs obtained.  She has a mild leukocytosis but actually improved from prior.  Her metabolic panel is at baseline.  She has no signs or symptoms of sepsis.  We will start on antibiotics.  Feel she can be treated as an outpatient.  Will trial doxycycline given recent course of Keflex.  Follow-up with primary physician recommended.  After history, exam, and medical  workup I feel the patient has been appropriately medically screened and is safe for discharge home. Pertinent diagnoses were discussed with the patient. Patient was given return precautions.  Final Clinical Impression(s) / ED Diagnoses Final diagnoses:  Cellulitis of left lower extremity    Rx / DC Orders ED Discharge Orders          Ordered    doxycycline (VIBRAMYCIN) 100 MG capsule  2 times daily        12/07/20 0038             Kristof Nadeem, Barbette Hair, MD 12/07/20 0040

## 2020-12-07 NOTE — Discharge Instructions (Addendum)
Return for worsening redness, fevers, any new or worsening symptoms.

## 2020-12-09 ENCOUNTER — Other Ambulatory Visit: Payer: Self-pay | Admitting: Urology

## 2020-12-10 DIAGNOSIS — N130 Hydronephrosis with ureteropelvic junction obstruction: Secondary | ICD-10-CM | POA: Diagnosis not present

## 2020-12-11 DIAGNOSIS — L0390 Cellulitis, unspecified: Secondary | ICD-10-CM | POA: Diagnosis not present

## 2020-12-11 DIAGNOSIS — R609 Edema, unspecified: Secondary | ICD-10-CM | POA: Diagnosis not present

## 2020-12-16 ENCOUNTER — Ambulatory Visit (INDEPENDENT_AMBULATORY_CARE_PROVIDER_SITE_OTHER): Payer: Medicare Other | Admitting: Podiatry

## 2020-12-16 ENCOUNTER — Encounter: Payer: Self-pay | Admitting: Podiatry

## 2020-12-16 ENCOUNTER — Other Ambulatory Visit: Payer: Self-pay

## 2020-12-16 DIAGNOSIS — B351 Tinea unguium: Secondary | ICD-10-CM

## 2020-12-16 DIAGNOSIS — M79609 Pain in unspecified limb: Secondary | ICD-10-CM | POA: Diagnosis not present

## 2020-12-16 DIAGNOSIS — D473 Essential (hemorrhagic) thrombocythemia: Secondary | ICD-10-CM

## 2020-12-16 NOTE — Progress Notes (Signed)
This patient returns to the office for evaluation and treatment of long thick painful nails .  This patient is unable to trim her own nails since the patient cannot reach her feet.  Patient says the nails are painful walking and wearing his shoes.  He returns for preventive foot care services.  General Appearance  Alert, conversant and in no acute stress.  Vascular  Dorsalis pedis and posterior tibial  pulses are palpable  bilaterally.  Capillary return is within normal limits  bilaterally. Temperature is within normal limits  bilaterally.  Neurologic  Senn-Weinstein monofilament wire test within normal limits  bilaterally. Muscle power within normal limits bilaterally.  Nails Thick disfigured discolored nails with subungual debris  from hallux to fifth toes bilaterally. No evidence of bacterial infection or drainage bilaterally.  Orthopedic  No limitations of motion  feet .  No crepitus or effusions noted.  No bony pathology or digital deformities noted.  Skin  normotropic skin with no porokeratosis noted bilaterally.  No signs of infections or ulcers noted.     Onychomycosis  Pain in toes right foot  Pain in toes left foot  Debridement  of nails  1-5  B/L with a nail nipper.  Nails were then filed using a dremel tool with no incidents.    RTC  12 weeks    Gardiner Barefoot DPM

## 2020-12-18 DIAGNOSIS — L03119 Cellulitis of unspecified part of limb: Secondary | ICD-10-CM | POA: Diagnosis not present

## 2020-12-28 DIAGNOSIS — R600 Localized edema: Secondary | ICD-10-CM | POA: Diagnosis not present

## 2020-12-28 DIAGNOSIS — I8310 Varicose veins of unspecified lower extremity with inflammation: Secondary | ICD-10-CM | POA: Diagnosis not present

## 2020-12-28 DIAGNOSIS — E2839 Other primary ovarian failure: Secondary | ICD-10-CM | POA: Diagnosis not present

## 2021-01-05 ENCOUNTER — Other Ambulatory Visit: Payer: Self-pay

## 2021-01-05 ENCOUNTER — Encounter (HOSPITAL_BASED_OUTPATIENT_CLINIC_OR_DEPARTMENT_OTHER): Payer: Self-pay | Admitting: Urology

## 2021-01-05 NOTE — Progress Notes (Signed)
Spoke w/ via phone for pre-op interview--- [place]----   NONE            Lab results------ COVID test -----patient states asymptomatic no test needed Arrive at ------- 0845 NPO after MN NO Solid Food.  Clear liquids from MN until--- Med rec completed Medications to take morning of surgery -----eye drops Diabetic medication ----- Patient instructed no nail polish to be worn day of surgery Patient instructed to bring photo id and insurance card day of surgery Patient aware to have Driver (ride ) / caregiver    for 24 hours after surgery  Patient Special Instructions ----- Pre-Op special Istructions ----- Patient verbalized understanding of instructions that were given at this phone interview. Patient denies shortness of breath, chest pain, fever, cough at this phone interview.

## 2021-01-07 ENCOUNTER — Other Ambulatory Visit: Payer: Self-pay | Admitting: Pulmonary Disease

## 2021-01-08 NOTE — Telephone Encounter (Signed)
I have made a follow up and refill has been sent and patient is aware of the follow up. Nothing further needed.

## 2021-01-08 NOTE — Telephone Encounter (Signed)
Ok to order 1 month supply Wixela 100-50 mcg ONE puff TWICE a day.  She will need visit in January for refills since approaching 1 year since last seen in clinic

## 2021-01-10 NOTE — Anesthesia Preprocedure Evaluation (Addendum)
Anesthesia Evaluation  Patient identified by MRN, date of birth, ID band Patient awake    Reviewed: Allergy & Precautions, NPO status , Patient's Chart, lab work & pertinent test results  Airway Mallampati: II  TM Distance: >3 FB Neck ROM: Full    Dental no notable dental hx. (+) Missing, Upper Dentures   Pulmonary former smoker,    Pulmonary exam normal breath sounds clear to auscultation       Cardiovascular Exercise Tolerance: Good Normal cardiovascular exam Rhythm:Regular Rate:Normal     Neuro/Psych  Headaches, negative psych ROS   GI/Hepatic Neg liver ROS, GERD  ,  Endo/Other    Renal/GU Renal diseaseL ureteral calculi     Musculoskeletal negative musculoskeletal ROS (+)   Abdominal   Peds  Hematology   Anesthesia Other Findings All: sulfa, nystatin, doxycycline  Reproductive/Obstetrics                            Anesthesia Physical Anesthesia Plan  ASA: 2  Anesthesia Plan: General   Post-op Pain Management:    Induction: Intravenous  PONV Risk Score and Plan: 3 and Treatment may vary due to age or medical condition and Ondansetron  Airway Management Planned: LMA  Additional Equipment: None  Intra-op Plan:   Post-operative Plan:   Informed Consent: I have reviewed the patients History and Physical, chart, labs and discussed the procedure including the risks, benefits and alternatives for the proposed anesthesia with the patient or authorized representative who has indicated his/her understanding and acceptance.     Dental advisory given  Plan Discussed with: CRNA  Anesthesia Plan Comments:        Anesthesia Quick Evaluation

## 2021-01-12 ENCOUNTER — Ambulatory Visit (HOSPITAL_BASED_OUTPATIENT_CLINIC_OR_DEPARTMENT_OTHER)
Admission: RE | Admit: 2021-01-12 | Discharge: 2021-01-12 | Disposition: A | Discharge status: Home / Self Care | Payer: Medicare Other | Source: Ambulatory Visit | Attending: Urology | Admitting: Urology

## 2021-01-12 ENCOUNTER — Encounter (HOSPITAL_BASED_OUTPATIENT_CLINIC_OR_DEPARTMENT_OTHER): Payer: Self-pay | Admitting: Urology

## 2021-01-12 ENCOUNTER — Encounter (HOSPITAL_BASED_OUTPATIENT_CLINIC_OR_DEPARTMENT_OTHER)
Admission: RE | Disposition: A | Discharge status: Home / Self Care | Payer: Self-pay | Source: Ambulatory Visit | Attending: Urology

## 2021-01-12 ENCOUNTER — Ambulatory Visit (HOSPITAL_BASED_OUTPATIENT_CLINIC_OR_DEPARTMENT_OTHER): Payer: Medicare Other | Admitting: Anesthesiology

## 2021-01-12 DIAGNOSIS — Z466 Encounter for fitting and adjustment of urinary device: Secondary | ICD-10-CM | POA: Diagnosis not present

## 2021-01-12 DIAGNOSIS — Z87891 Personal history of nicotine dependence: Secondary | ICD-10-CM | POA: Insufficient documentation

## 2021-01-12 DIAGNOSIS — N135 Crossing vessel and stricture of ureter without hydronephrosis: Secondary | ICD-10-CM | POA: Diagnosis not present

## 2021-01-12 DIAGNOSIS — N130 Hydronephrosis with ureteropelvic junction obstruction: Secondary | ICD-10-CM | POA: Diagnosis not present

## 2021-01-12 HISTORY — PX: CYSTOSCOPY WITH URETEROSCOPY AND STENT PLACEMENT: SHX6377

## 2021-01-12 SURGERY — CYSTOURETEROSCOPY, WITH STENT INSERTION
Anesthesia: General | Site: Ureter | Laterality: Left

## 2021-01-12 MED ORDER — DEXAMETHASONE SODIUM PHOSPHATE 4 MG/ML IJ SOLN
INTRAMUSCULAR | Status: DC | PRN
  Administered 2021-01-12: 4 mg via INTRAVENOUS

## 2021-01-12 MED ORDER — ONDANSETRON HCL 4 MG/2ML IJ SOLN
INTRAMUSCULAR | Status: DC | PRN
  Administered 2021-01-12: 4 mg via INTRAVENOUS

## 2021-01-12 MED ORDER — SODIUM CHLORIDE 0.9 % IR SOLN
Status: DC | PRN
  Administered 2021-01-12: 3000 mL

## 2021-01-12 MED ORDER — LACTATED RINGERS IV SOLN: INTRAVENOUS | Status: DC

## 2021-01-12 MED ORDER — CEFAZOLIN SODIUM-DEXTROSE 2-4 GM/100ML-% IV SOLN
INTRAVENOUS | Status: AC
  Filled 2021-01-12: qty 100

## 2021-01-12 MED ORDER — NITROFURANTOIN MACROCRYSTAL 50 MG PO CAPS: 50.0000 mg | ORAL_CAPSULE | Freq: Every day | ORAL | 0 refills | Status: DC

## 2021-01-12 MED ORDER — CEFAZOLIN SODIUM-DEXTROSE 2-4 GM/100ML-% IV SOLN
2.0000 g | Freq: Once | INTRAVENOUS | Status: AC
  Administered 2021-01-12: 2 g via INTRAVENOUS

## 2021-01-12 MED ORDER — IOHEXOL 300 MG/ML  SOLN
INTRAMUSCULAR | Status: DC | PRN
  Administered 2021-01-12: 10 mL

## 2021-01-12 MED ORDER — PHENYLEPHRINE HCL (PRESSORS) 10 MG/ML IV SOLN
INTRAVENOUS | Status: DC | PRN
  Administered 2021-01-12: 40 ug via INTRAVENOUS

## 2021-01-12 MED ORDER — LIDOCAINE HCL (CARDIAC) PF 100 MG/5ML IV SOSY
PREFILLED_SYRINGE | INTRAVENOUS | Status: DC | PRN
Start: 2021-01-12 — End: 2021-01-12
  Administered 2021-01-12: 80 mg via INTRAVENOUS

## 2021-01-12 MED ORDER — PROPOFOL 10 MG/ML IV BOLUS
INTRAVENOUS | Status: DC | PRN
  Administered 2021-01-12: 80 mg via INTRAVENOUS
  Administered 2021-01-12: 20 mg via INTRAVENOUS

## 2021-01-12 MED ORDER — FENTANYL CITRATE (PF) 100 MCG/2ML IJ SOLN
INTRAMUSCULAR | Status: DC | PRN
  Administered 2021-01-12 (×2): 12.5 ug via INTRAVENOUS

## 2021-01-12 MED ORDER — FENTANYL CITRATE (PF) 100 MCG/2ML IJ SOLN
INTRAMUSCULAR | Status: AC
  Filled 2021-01-12: qty 2

## 2021-01-12 SURGICAL SUPPLY — 20 items
BAG DRAIN URO-CYSTO SKYTR STRL (DRAIN) ×2 IMPLANT
BAG DRN UROCATH (DRAIN) ×1
CATH URET 5FR 28IN CONE TIP (BALLOONS)
CATH URET 5FR 28IN OPEN ENDED (CATHETERS) ×1 IMPLANT
CATH URET 5FR 70CM CONE TIP (BALLOONS) IMPLANT
CATH URET DUAL LUMEN 6-10FR 50 (CATHETERS) IMPLANT
CLOTH BEACON ORANGE TIMEOUT ST (SAFETY) ×2 IMPLANT
GLOVE SURG ENC MOIS LTX SZ7.5 (GLOVE) ×2 IMPLANT
GLOVE SURG ENC MOIS LTX SZ8 (GLOVE) IMPLANT
GOWN STRL REUS W/TWL LRG LVL3 (GOWN DISPOSABLE) ×2 IMPLANT
GUIDEWIRE ANG ZIPWIRE 038X150 (WIRE) IMPLANT
GUIDEWIRE STR DUAL SENSOR (WIRE) ×2 IMPLANT
GUIDEWIRE ZIPWRE .038 STRAIGHT (WIRE) ×1 IMPLANT
IV NS IRRIG 3000ML ARTHROMATIC (IV SOLUTION) ×4 IMPLANT
KIT TURNOVER CYSTO (KITS) ×2 IMPLANT
MANIFOLD NEPTUNE II (INSTRUMENTS) ×2 IMPLANT
NS IRRIG 500ML POUR BTL (IV SOLUTION) ×2 IMPLANT
PACK CYSTO (CUSTOM PROCEDURE TRAY) ×2 IMPLANT
TUBE CONNECTING 12X1/4 (SUCTIONS) ×2 IMPLANT
TUBING UROLOGY SET (TUBING) ×2 IMPLANT

## 2021-01-12 NOTE — Anesthesia Postprocedure Evaluation (Signed)
Anesthesia Post Note  Patient: Chelsea Garcia  Procedure(s) Performed: CYSTOSCOPY WITH URETEROSCOPY AND STENT EXCHANGE (Left: Ureter)     Patient location during evaluation: PACU Anesthesia Type: General Level of consciousness: awake and alert Pain management: pain level controlled Vital Signs Assessment: post-procedure vital signs reviewed and stable Respiratory status: spontaneous breathing, nonlabored ventilation, respiratory function stable and patient connected to nasal cannula oxygen Cardiovascular status: blood pressure returned to baseline and stable Postop Assessment: no apparent nausea or vomiting Anesthetic complications: no   No notable events documented.  Last Vitals:  Vitals:   01/12/21 1322 01/12/21 1354  BP:  (!) 122/56  Pulse: 82 84  Resp: 14 15  Temp:  36.9 C  SpO2: 98% 95%    Last Pain:  Vitals:   01/12/21 1420  TempSrc:   PainSc: 0-No pain                 Barnet Glasgow

## 2021-01-12 NOTE — Discharge Instructions (Addendum)

## 2021-01-12 NOTE — Transfer of Care (Signed)
Immediate Anesthesia Transfer of Care Note  Patient: [name]  Procedure(s) Performed: CYSTOSCOPY WITH URETEROSCOPY AND STENT EXCHANGE (Left: Ureter)  Patient Location: PACU  Anesthesia Type:General  Level of Consciousness: awake and patient cooperative  Airway & Oxygen Therapy: Patient Spontanous Breathing and Patient connected to nasal cannula oxygen  Post-op Assessment: Report given to RN and Post -op Vital signs reviewed and stable  Post vital signs: Reviewed and stable  Last Vitals:  Vitals Value Taken Time  BP    Temp    Pulse 78 01/12/21 1254  Resp 15 01/12/21 1254  SpO2 97 % 01/12/21 1254  Vitals shown include unvalidated device data.  Last Pain:  Vitals:   01/12/21 0930  TempSrc: Oral  PainSc: 0-No pain      Patients Stated Pain Goal: 5 (30/86/57 8469)  Complications: No notable events documented.

## 2021-01-12 NOTE — Op Note (Signed)
Preoperative diagnosis: Left greater UPJ obstruction Postoperative diagnosis: Same  Procedure: Cystoscopy with left retrograde pyelogram, diagnostic left ureteroscopy, left ureteral stent exchange  Surgeon: Junious Silk  Anesthesia: General  Indication for procedure: Chelsea Garcia is an 88 year old female with right and left UPJ obstruction of variable degree on scans over the past couple years.  The left became severe and her creatinine bumped and therefore we stented it.  She was brought today for diagnostic ureteroscopy and possible stent removal.  Findings: Patient had complained preoperatively about a lesion near the right labia that was bothering her.  She thought it resolved after putting Neosporin ointment on it.  Today on exam under anesthesia the introitus appeared normal.  No lesions.  There were no labial or gluteal palpable areas of induration or fluctuance.  No lesions were noted.  She has a urethral carbuncle.  On cystoscopy the stent was completely encrusted.  Once the stent was removed it had lightly encrusted over its entire course.  She will not do well with a long-term stent given this quick encrustation only after only 7 weeks.  Again noted the left kidney has dropped low and medial.  The left ureter actually comes out and does a 180 degree turn going superiorly and then comes lateral and down to the UVJ.  Interestingly the wire and the single channel digital ureteroscope passed easily into the renal pelvis.  Also the stent exchange was quite straightforward for such a tortuous path.   Left retrograde pyelogram-this outlined a dilated collecting system and renal pelvis but improved from prior.  No extravasation.  On ureteroscopy the collecting system appeared normal without lesion although I could not advance into the collecting system I got a good view from a few centimeters inside the UPJ.  The UPJ appeared normal and then quickly the ureter twisted around and then back down to the  bladder.  No lesions were noted.  No injury noted.   Description of procedure: After consent was obtained patient brought to the operating room.  After adequate anesthesia she was placed lithotomy position and prepped and draped in the usual sterile fashion.  Timeout was performed to confirm the patient and procedure.  The cystoscope was passed per urethra and the left stent appeared encrusted.  Therefore I took an open-ended catheter and guided a sensor wire adjacent to the stent and into the collecting system.  It passed easily.  The stent was then grasped and removed without difficulty although there was some resistance.  Stent was slightly encrusted over its entire length which is certainly a quick encrustation especially to be noted along the stent body in the ureter which typically does not encrust.  I then used the open-ended catheter to pass a second Glidewire and coiled that up into the collecting system.  I then passed over the sensor wire and a single channel digital ureteroscope.  The wire was removed and I found myself in the collecting system.  Retrograde injection of contrast was performed through the scope to confirm placement and anatomy.  I then pulled back through the renal pelvis and the scope quickly spun around and no lesions were noted.  The ureter inspected on the way out no lesions noted.  The Glidewire was backloaded on the cystoscope and a 6x24 cm stent advanced.  The wire was removed with a good coil seen in the collecting system and a good coil in the bladder.  Given the scope today and some encrustation I thought it best to leave a  stent but I will take that out when she follows up in a few weeks.  I will keep her on a nightly antibiotic.  On exam under anesthesia was performed with [name] looking on a chaperone.  Again no lesions noted.  She was then awakened taken the cover room in stable condition.  Complications: None  Blood loss: Minimal  Specimens: None  Drains: 6 x 24  cm left ureteral stent  Disposition: Patient stable to PACU-spoke with [name] and went over the procedure, postop care and follow-up.

## 2021-01-12 NOTE — Anesthesia Procedure Notes (Signed)
Procedure Name: LMA Insertion Date/Time: 01/12/2021 12:10 PM Performed by: Georgeanne Nim, CRNA Pre-anesthesia Checklist: Patient identified, Emergency Drugs available, Suction available, Patient being monitored and Timeout performed Patient Re-evaluated:Patient Re-evaluated prior to induction Oxygen Delivery Method: Circle system utilized Preoxygenation: Pre-oxygenation with 100% oxygen Induction Type: IV induction Ventilation: Mask ventilation without difficulty LMA: LMA inserted LMA Size: 3.0 Number of attempts: 1 Placement Confirmation: positive ETCO2, CO2 detector and breath sounds checked- equal and bilateral Tube secured with: Tape Dental Injury: Teeth and Oropharynx as per pre-operative assessment

## 2021-01-12 NOTE — H&P (Signed)
H&P  Chief Complaint: left > right UPJ obs   History of Present Illness:  Chelsea Garcia has worsening/alternating bilateral UPJ obstruction first picked up on CT scan in 2019 and worse on October 2020 to CT scan. Creatinine remains overall stable at 10/16/2020 1.0. Creatinine was as high as 1.3 and as low as 0.8 over the past few months.  She underwent cystoscopy with bilateral retrograde pyelogram and left ureteral stent placement November 26, 2020.  The right side appeared to continue to show mild right UPJ but drainage, the left kidney had dropped and was low-lying with severe left UPJ obstruction, a 360 degree curve of the ureter from the UPJ medially all the way around and then back to the bladder and the left UVJ.  This was stented.  Cr was 0.94 Nov 2022.    She is brought today for cystoscopy with left ureteroscopy for diagnostic purposes.  She has been well.  No dysuria or gross hematuria.  No fever.  She had a 12/10/2020 office visit that showed a UA with a few bacteria but culture negative. She reports the "cellulitis" resolved after doxy but reported to nurse a recent labial lesion which resolved after she washed it with dial soap and put neosporin on it.    Imaging/staging:  03/19 CT a/p: right extrarenal pelvis left mild to moderate hydro consistent with UPJ. She has splenomegaly.  04/22 CT a/p: left greater than right mild to moderate hydronephrosis consistent with left greater than right UPJ obstruction  06/22 CTAP: right moderate and left mild dilation of renal pelvis consistent with right greater than left UPJ obstruction  10/22 CTAP: Moderate right hydronephrosis consistent with UPJ obstruction, moderate to severe left hydronephrosis with perinephric stranding consistent with left UPJ obstruction. Spleen larger.  11/22 CT right side hydro improved and now only right extrarenal pelvis. Left UPJ obstruction worse with increased dilation. WBC 14.5 (stable) and Cr 1.13 (as high as 1.3  recently). UA bland - no bacteria.  11/22 cysto, right RGP (mild right UPJ, no stent placed), left RGP (severe left UPJ with ptosis of left kidney and 360 degree looping ureter, stented)   She retired as a Engineer, maintenance ([employer]). She lives [place] her son and [place]    She has never had bladder or kidney surgery.    Past Medical History:  Diagnosis Date   Bronchiectasis [facility])    followed by dr Rayann Heman completed pulmonary rehan 06-02-2020   Cellulitis of right foot    recurrent cellulitis last 7 days finishes ciprofloxacin for on 11-26-2020   Colon polyps    Benign. 2 colonoscopies in past. Due now again.   Complication of anesthesia    "got dizzy in 70's"   Diarrhea    resolved now taking imodium ad prn   Dry eye 11/25/2020   both eyes   Endometriosis    required TAH when young   Essential tremor    since 40's   GERD (gastroesophageal reflux disease)    denis   Glaucoma    started in 40's both eyes   Low back pain    since 40's   Migraine    started in 50's last migraine 10 or 20 yrs ago per pt on 11-25-2020   Osteopenia    2014   Seasonal allergies    to Dust, mold and pollen.   Thrombocytosis 09/2013   hx of resolved follows up with dr Truitt Merle oncology   Tick bites    she likes  out door activities, 2 this year (2015)   UPJ (ureteropelvic junction) obstruction 11/25/2020   left   Urinary frequency    Wears dentures    upper   Wears glasses    Past Surgical History:  Procedure Laterality Date   CATARACT EXTRACTION Left 02/07/2013   right 2015   colonscopy     10 or 15 yrs ago per pt on 11-25-2020   CYSTOSCOPY W/ URETERAL STENT PLACEMENT Left 11/26/2020   Procedure: CYSTOSCOPY WITH BILATERAL RETROGRADE PYELOGRAM/URETERAL STENT PLACEMENT;  Surgeon: Festus Aloe, MD;  Location: [facility];  Service: Urology;  Laterality: Left;  ONLY NEEDS 30 MIN   INGUINAL HERNIA REPAIR Left 01/09/2020   Procedure: LEFT INGUINAL HERNIA REPAIR WITH MESH;  Surgeon:  Erroll Luna, MD;  Location: [place];  Service: General;  Laterality: Left;   INSERTION OF MESH Left 01/09/2020   Procedure: INSERTION OF MESH;  Surgeon: Erroll Luna, MD;  Location: [place];  Service: General;  Laterality: Left;   TOTAL ABDOMINAL HYSTERECTOMY  09/10/1968   TUBAL LIGATION     yrs ago    Home Medications:  No medications prior to admission.   Allergies:  Allergies  Allergen Reactions   Nystatin     Swelling of lips   Bee Pollen Other (See Comments) and Cough    Mill dust, pollen. Mill dust, pollen.   Pollen Extract Cough    Mill dust, pollen.   Albuterol     Increased HR, had to go to the ER   Doxycycline Nausea And Vomiting   Mangifera Indica Fruit Ext (Mango) [Mangifera Indica] Diarrhea   Mite (D. Farinae) Other (See Comments)    General allergy   Sulfamethoxazole Other (See Comments)    Irritation in eyes  Other Other    Family History  Problem Relation Age of Onset   Thyroid disease Mother    Cancer Father    Cancer Maternal Aunt 80   Social History:  reports that she quit smoking about 59 years ago. Her smoking use included cigarettes. She has a 8.00 pack-year smoking history. She has never used smokeless tobacco. She reports that she does not currently use alcohol. She reports that she does not use drugs.  ROS: A complete review of systems was performed.  All systems are negative except for pertinent findings as noted. Review of Systems  All other systems reviewed and are negative.   Physical Exam:  Vital signs in last 24 hours:   General:  Alert and oriented, No acute distress HEENT: Normocephalic, atraumatic Cardiovascular: Regular rate and rhythm Lungs: Regular rate and effort Abdomen: Soft, nontender, nondistended, no abdominal masses Back: No CVA tenderness Extremities: No edema Neurologic: Grossly intact  Laboratory Data:  No results found for this or any previous visit (from the past 24  hour(s)). No results found for this or any previous visit (from the past 240 hour(s)). Creatinine: No results for input(s): CREATININE in the last 168 hours.  Impression/Assessment:  Left greater than right UPJ obstruction-  Plan:  I discussed with the patient the nature, potential benefits, risks and alternatives to cystoscopy with left ureteroscopy possible biopsy, left ureteral stent exchange, including side effects of the proposed treatment, the likelihood of the patient achieving the goals of the procedure, and any potential problems that might occur during the procedure or recuperation. Discussed we may not be able to access the left kidney given the looping path of the ureter. She has follow-up January 29, 2021 and I might remove the stent that day depending on how things look today. All questions answered. Patient elects to proceed.   Festus Aloe 01/12/2021

## 2021-01-13 ENCOUNTER — Encounter (HOSPITAL_BASED_OUTPATIENT_CLINIC_OR_DEPARTMENT_OTHER): Payer: Self-pay | Admitting: Urology

## 2021-01-18 ENCOUNTER — Ambulatory Visit: Payer: Medicare Other | Admitting: Podiatry

## 2021-01-22 DIAGNOSIS — H0100B Unspecified blepharitis left eye, upper and lower eyelids: Secondary | ICD-10-CM | POA: Diagnosis not present

## 2021-01-22 DIAGNOSIS — H401133 Primary open-angle glaucoma, bilateral, severe stage: Secondary | ICD-10-CM | POA: Diagnosis not present

## 2021-01-22 DIAGNOSIS — H0100A Unspecified blepharitis right eye, upper and lower eyelids: Secondary | ICD-10-CM | POA: Diagnosis not present

## 2021-01-23 DIAGNOSIS — L03116 Cellulitis of left lower limb: Secondary | ICD-10-CM | POA: Diagnosis not present

## 2021-01-23 DIAGNOSIS — L03115 Cellulitis of right lower limb: Secondary | ICD-10-CM | POA: Diagnosis not present

## 2021-01-29 DIAGNOSIS — N130 Hydronephrosis with ureteropelvic junction obstruction: Secondary | ICD-10-CM | POA: Diagnosis not present

## 2021-02-04 ENCOUNTER — Other Ambulatory Visit: Payer: Medicare Other

## 2021-02-04 DIAGNOSIS — L0390 Cellulitis, unspecified: Secondary | ICD-10-CM | POA: Diagnosis not present

## 2021-02-05 ENCOUNTER — Ambulatory Visit (INDEPENDENT_AMBULATORY_CARE_PROVIDER_SITE_OTHER): Payer: Medicare Other | Admitting: Pulmonary Disease

## 2021-02-05 ENCOUNTER — Other Ambulatory Visit: Payer: Self-pay

## 2021-02-05 ENCOUNTER — Encounter: Payer: Self-pay | Admitting: Pulmonary Disease

## 2021-02-05 VITALS — BP 122/60 | HR 66 | Temp 98.3°F | Ht 61.0 in | Wt 110.4 lb

## 2021-02-05 DIAGNOSIS — J479 Bronchiectasis, uncomplicated: Secondary | ICD-10-CM | POA: Diagnosis not present

## 2021-02-05 MED ORDER — FLUTICASONE-SALMETEROL 100-50 MCG/ACT IN AEPB: 1.0000 | INHALATION_SPRAY | Freq: Two times a day (BID) | RESPIRATORY_TRACT | 11 refills | Status: DC

## 2021-02-05 NOTE — Progress Notes (Signed)
Subjective:   PATIENT ID: Chelsea Garcia GENDER: female DOB: 1933/07/11, MRN: [id number]   HPI  Chief Complaint  Patient presents with   Follow-up    No updates    Reason for Visit: Follow-up bronchiectasis  [name] is an 88 year old female former smoker who presents for follow-up for bronchiectasis.  Synopsis: Referred to Pulmonary clinic for CT demonstrating bronchiectasis and tree-in-bud opacities. She has long history of cough, wheezing and chest congestion that has worsened in the summer of 2021.   02/05/21 Since our last visit, she has been compliant with her Advair and reports symptoms are well-controlled. She is able to take walks but limited hip or joint pain. Denies shortness of breath with regular activity. No coughing or wheezing. Denies congestion. Denies respiratory infections in the last year. Has not needed flutter valve in a long time.  Social History: Former smoker. Quit in 1963.   Past Medical History:  Diagnosis Date   Bronchiectasis [facility])    followed by dr Chelsea Garcia completed pulmonary rehan 06-02-2020   Cellulitis of right foot    recurrent cellulitis last 7 days finishes ciprofloxacin for on 11-26-2020   Colon polyps    Benign. 2 colonoscopies in past. Due now again.   Complication of anesthesia    "got dizzy in 70's"   Diarrhea    resolved now taking imodium ad prn   Dry eye 11/25/2020   both eyes   Endometriosis    required TAH when young   Essential tremor    since 40's   GERD (gastroesophageal reflux disease)    denis   Glaucoma    started in 40's both eyes   Low back pain    since 40's   Migraine    started in 50's last migraine 10 or 20 yrs ago per pt on 11-25-2020   Osteopenia    2014   Seasonal allergies    to Dust, mold and pollen.   Thrombocytosis 09/2013   hx of resolved follows up with dr Chelsea Garcia oncology   Tick bites    she likes out door activities, 2 this year (2015)   UPJ (ureteropelvic junction) obstruction  11/25/2020   left   Urinary frequency    Wears dentures    upper   Wears glasses      Allergies  Allergen Reactions   Nystatin     Swelling of lips   Bee Pollen Other (See Comments) and Cough    Mill dust, pollen. Mill dust, pollen.   Pollen Extract Cough    Mill dust, pollen.   Albuterol     Increased HR, had to go to the ER   Doxycycline Nausea And Vomiting   Mangifera Indica Fruit Ext (Mango) [Mangifera Indica] Diarrhea   Mite (D. Farinae) Other (See Comments)    General allergy   Sulfamethoxazole Other (See Comments)    Irritation in eyes  Other Other     Outpatient Medications Prior to Visit  Medication Sig Dispense Refill   acetaminophen (TYLENOL) 500 MG tablet Take 500 mg by mouth every 6 (six) hours as needed for moderate pain.     aspirin EC 81 MG tablet Take 81 mg by mouth daily. Swallow whole.     bismuth subsalicylate (PEPTO BISMOL) 262 MG/15ML suspension Take 30 mLs by mouth every 6 (six) hours as needed.     brimonidine (ALPHAGAN) 0.2 % ophthalmic solution Place 1 drop into both eyes 2 (two) times daily.  calcium carbonate (OS-CAL) 1250 (500 Ca) MG chewable tablet Chew 1 tablet by mouth daily.     fluticasone-salmeterol (WIXELA INHUB) 100-50 MCG/ACT AEPB INHALE 1 PUFF INTO THE LUNGS IN THE MORNING AND AT BEDTIME 60 each 0   Latanoprostene Bunod (VYZULTA) 0.024 % SOLN Apply 1 drop to eye every evening. Both eyes     loperamide (IMODIUM A-D) 2 MG tablet Take 2 mg by mouth 4 (four) times daily as needed for diarrhea or loose stools.     Multiple Vitamin (MULTI VITAMIN) TABS Take 2 tablets by mouth daily.     Netarsudil Dimesylate (RHOPRESSA) 0.02 % SOLN Apply 1 drop to eye every evening. Both eyes     ondansetron (ZOFRAN-ODT) 8 MG disintegrating tablet Take 1 tablet (8 mg total) by mouth every 8 (eight) hours as needed for nausea or vomiting. (Patient taking differently: Take 8 mg by mouth every 8 (eight) hours as needed for nausea or vomiting. Has not taken has  on hand) 20 tablet 0   Polyethyl Glycol-Propyl Glycol (SYSTANE OP) Apply to eye. Uses eye drops prn for dry eye     ciprofloxacin (CIPRO) 500 MG tablet Take 500 mg by mouth 3 (three) times daily. For right foot cellulitis finishes 11-26-2020 (Patient not taking: Reported on 02/05/2021)     doxycycline (VIBRAMYCIN) 100 MG capsule Take 1 capsule (100 mg total) by mouth 2 (two) times daily. (Patient not taking: Reported on 02/05/2021) 20 capsule 0   nitrofurantoin (MACRODANTIN) 50 MG capsule Take 1 capsule (50 mg total) by mouth at bedtime. (Patient not taking: Reported on 02/05/2021) 30 capsule 0   oxyCODONE-acetaminophen (PERCOCET) 5-325 MG tablet Take 1 tablet by mouth every 4 (four) hours as needed for moderate pain. (Patient not taking: Reported on 02/05/2021) 15 tablet 0   No facility-administered medications prior to visit.    Review of Systems  Constitutional:  Negative for chills, diaphoresis, fever, malaise/fatigue and weight loss.  HENT:  Negative for congestion.   Respiratory:  Negative for cough, hemoptysis, sputum production, shortness of breath and wheezing.   Cardiovascular:  Negative for chest pain, palpitations and leg swelling.  Musculoskeletal:  Positive for joint pain.    Objective:   Vitals:   02/05/21 1416  BP: 122/60  Pulse: 66  Temp: 98.3 F (36.8 C)  TempSrc: Oral  SpO2: 96%  Weight: 110 lb 6.4 oz (50.1 kg)  Height: 5\' 1"  (1.549 m)   SpO2: 96 % O2 Device: None (Room air)  Physical Exam: General: Well-appearing, no acute distress HENT: [place], AT Eyes: EOMI, no scleral icterus Respiratory: Clear to auscultation bilaterally.  No crackles, wheezing or rales Cardiovascular: RRR, -M/R/G, no JVD Extremities:-Edema,-tenderness Neuro: AAO x4, CNII-XII grossly intact Psych: Normal mood, normal affect  Data Reviewed:  Imaging: CT 07/25/19 has bronchiectasis involving in the right middle and lingula associated with volume loss, scarring and few tree-in-bud  opacities  PFT: None on file  Labs: CBC    Component Value Date/Time   WBC 10.8 (H) 12/07/2020 0002   RBC 3.97 12/07/2020 0002   HGB 11.1 (L) 12/07/2020 0002   HGB 12.0 05/06/2020 1027   HGB 12.4 12/16/2016 0908   HCT 35.7 (L) 12/07/2020 0002   HCT 38.6 12/16/2016 0908   PLT 309 12/07/2020 0002   PLT 416 (H) 05/06/2020 1027   PLT 271 12/16/2016 0908   MCV 89.9 12/07/2020 0002   MCV 101.8 (H) 12/16/2016 0908   MCH 28.0 12/07/2020 0002   MCHC 31.1 12/07/2020 0002  RDW 18.1 (H) 12/07/2020 0002   RDW 15.9 (H) 12/16/2016 0908   LYMPHSABS 1.0 12/07/2020 0002   LYMPHSABS 0.6 (L) 12/16/2016 0908   MONOABS 0.3 12/07/2020 0002   MONOABS 0.3 12/16/2016 0908   EOSABS 0.1 12/07/2020 0002   EOSABS 0.1 12/16/2016 0908   BASOSABS 0.0 12/07/2020 0002   BASOSABS 0.0 12/16/2016 0908   Absolute eos 12/07/20 - 100  Assessment & Plan:   Discussion: 88 year old female who presents for follow-up for bronchiectasis. Etiology likely related to prior/recurrent infections. She has not had any respiratory exacerbations in > 1 year. She is compliant with her low-dose ICS/LABA. We discussed de-escalating bronchodilator regimen and monitoring signs of symptoms of needing to restart medication  Bronchiectasis without complication - well-controlled --CONTINUE Advair 100-50 mcg. Take ONE puff TWICE a day. REFILL --OK to start taking inhaler as needed during the summer time. If your symptoms (cough, shortness of breath, wheezing), you may need to restart regularly   Health Maintenance Immunization History  Administered Date(s) Administered   Fluad Quad(high Dose 65+) 10/09/2019   Influenza Split 11/22/2007, 11/10/2008, 11/06/2009, 10/14/2020   Influenza,inj,Quad PF,6+ Mos 10/30/2015, 11/03/2016, 09/23/2019   Influenza,inj,quad, With Preservative 09/12/2013, 10/09/2014   Influenza-Unspecified 10/11/2013   PFIZER Comirnaty(Gray Top)Covid-19 Tri-Sucrose Vaccine 06/05/2020   PFIZER(Purple  Top)SARS-COV-2 Vaccination 01/15/2019, 02/20/2019, 03/05/2019, 03/20/2019, 10/16/2019, 06/05/2020   Pfizer Covid-19 Vaccine Bivalent Booster 36yrs & up 10/14/2020   Pneumococcal Conjugate-13 10/09/2014   Pneumococcal Polysaccharide-23 01/16/2003   Td 07/19/1999   Tdap 08/08/2011, 05/27/2019   Zoster, Live 12/27/2006   CT Lung Screen - not qualified  No orders of the defined types were placed in this encounter.  Meds ordered this encounter  Medications   fluticasone-salmeterol (WIXELA INHUB) 100-50 MCG/ACT AEPB    Sig: Inhale 1 puff into the lungs 2 (two) times daily. in the morning and at bedtime.    Dispense:  60 each    Refill:  11    ZERO refills remain on this prescription. Your patient is requesting advance approval of refills for this medication to [facility]    Return in about 1 year (around 02/05/2022).  I have spent a total time of 30-minutes on the day of the appointment reviewing prior documentation, coordinating care and discussing medical diagnosis and plan with the patient/family. Past medical history, allergies, medications were reviewed. Pertinent imaging, labs and tests included in this note have been reviewed and interpreted independently by me.   Eydan Chianese Rodman Pickle, MD [facility] 02/05/2021 2:24 PM  Office Number [phone]

## 2021-02-05 NOTE — Patient Instructions (Signed)
°  Bronchiectasis without complication - well-controlled --CONTINUE Advair 100-50 mcg. Take ONE puff TWICE a day. REFILL --OK to start taking inhaler as needed during the summer time. If your symptoms (cough, shortness of breath, wheezing), you may need to restart regularly  Follow-up with me in 1 year

## 2021-02-11 ENCOUNTER — Encounter: Payer: Self-pay | Admitting: Pulmonary Disease

## 2021-02-12 DIAGNOSIS — L03115 Cellulitis of right lower limb: Secondary | ICD-10-CM | POA: Diagnosis not present

## 2021-02-23 DIAGNOSIS — I878 Other specified disorders of veins: Secondary | ICD-10-CM | POA: Diagnosis not present

## 2021-02-24 ENCOUNTER — Other Ambulatory Visit: Payer: Self-pay

## 2021-02-24 ENCOUNTER — Ambulatory Visit (INDEPENDENT_AMBULATORY_CARE_PROVIDER_SITE_OTHER): Payer: Medicare Other | Admitting: Podiatry

## 2021-02-24 DIAGNOSIS — B351 Tinea unguium: Secondary | ICD-10-CM

## 2021-02-24 DIAGNOSIS — M79609 Pain in unspecified limb: Secondary | ICD-10-CM | POA: Diagnosis not present

## 2021-02-24 DIAGNOSIS — D473 Essential (hemorrhagic) thrombocythemia: Secondary | ICD-10-CM

## 2021-02-24 NOTE — Progress Notes (Signed)
This patient returns to the office for evaluation and treatment of long thick painful nails .  This patient is unable to trim her own nails since the patient cannot reach her feet.  Patient says the nails are painful walking and wearing his shoes.  She returns for preventive foot care services.  General Appearance  Alert, conversant and in no acute stress.  Vascular  Dorsalis pedis and posterior tibial  pulses are weakly  palpable  bilaterally.  Capillary return is within normal limits  bilaterally. Temperature is within normal limits  bilaterally.  Neurologic  Senn-Weinstein monofilament wire test within normal limits  bilaterally. Muscle power within normal limits bilaterally.  Nails Thick disfigured discolored nails with subungual debris  from hallux to fifth toes bilaterally. No evidence of bacterial infection or drainage bilaterally.  Orthopedic  No limitations of motion  feet .  No crepitus or effusions noted.  No bony pathology or digital deformities noted.  Skin  normotropic skin with no porokeratosis noted bilaterally.  No signs of infections or ulcers noted.     Onychomycosis  Pain in toes right foot  Pain in toes left foot  Debridement  of nails  1-5  B/L with a nail nipper.  Nails were then filed using a dremel tool with no incidents.    RTC  12 weeks    Jaquese Irving DPM  

## 2021-02-25 ENCOUNTER — Other Ambulatory Visit: Payer: Self-pay

## 2021-02-25 ENCOUNTER — Inpatient Hospital Stay: Payer: Medicare Other | Attending: Hematology

## 2021-02-25 DIAGNOSIS — D473 Essential (hemorrhagic) thrombocythemia: Secondary | ICD-10-CM

## 2021-02-25 LAB — CBC WITH DIFFERENTIAL (CANCER CENTER ONLY)
Abs Immature Granulocytes: 0.93 10*3/uL — ABNORMAL HIGH (ref 0.00–0.07)
Basophils Absolute: 0.1 10*3/uL (ref 0.0–0.1)
Basophils Relative: 1 %
Eosinophils Absolute: 0.1 10*3/uL (ref 0.0–0.5)
Eosinophils Relative: 1 %
HCT: 38.6 % (ref 36.0–46.0)
Hemoglobin: 12.2 g/dL (ref 12.0–15.0)
Immature Granulocytes: 8 %
Lymphocytes Relative: 5 %
Lymphs Abs: 0.6 10*3/uL — ABNORMAL LOW (ref 0.7–4.0)
MCH: 27.7 pg (ref 26.0–34.0)
MCHC: 31.6 g/dL (ref 30.0–36.0)
MCV: 87.5 fL (ref 80.0–100.0)
Monocytes Absolute: 0.7 10*3/uL (ref 0.1–1.0)
Monocytes Relative: 6 %
Neutro Abs: 9.8 10*3/uL — ABNORMAL HIGH (ref 1.7–7.7)
Neutrophils Relative %: 79 %
Platelet Count: 350 10*3/uL (ref 150–400)
RBC: 4.41 MIL/uL (ref 3.87–5.11)
RDW: 17.5 % — ABNORMAL HIGH (ref 11.5–15.5)
WBC Count: 12.2 10*3/uL — ABNORMAL HIGH (ref 4.0–10.5)
nRBC: 0.3 % — ABNORMAL HIGH (ref 0.0–0.2)

## 2021-02-25 LAB — CMP (CANCER CENTER ONLY)
ALT: 12 U/L (ref 0–44)
AST: 15 U/L (ref 15–41)
Albumin: 4.5 g/dL (ref 3.5–5.0)
Alkaline Phosphatase: 43 U/L (ref 38–126)
Anion gap: 7 (ref 5–15)
BUN: 29 mg/dL — ABNORMAL HIGH (ref 8–23)
CO2: 26 mmol/L (ref 22–32)
Calcium: 9.4 mg/dL (ref 8.9–10.3)
Chloride: 106 mmol/L (ref 98–111)
Creatinine: 0.98 mg/dL (ref 0.44–1.00)
GFR, Estimated: 57 mL/min — ABNORMAL LOW (ref >60)
Glucose, Bld: 98 mg/dL (ref 70–99)
Potassium: 4.4 mmol/L (ref 3.5–5.1)
Sodium: 139 mmol/L (ref 135–145)
Total Bilirubin: 0.6 mg/dL (ref 0.3–1.2)
Total Protein: 7.4 g/dL (ref 6.5–8.1)

## 2021-03-08 DIAGNOSIS — J479 Bronchiectasis, uncomplicated: Secondary | ICD-10-CM | POA: Diagnosis not present

## 2021-03-08 DIAGNOSIS — M8580 Other specified disorders of bone density and structure, unspecified site: Secondary | ICD-10-CM | POA: Diagnosis not present

## 2021-03-08 DIAGNOSIS — E7800 Pure hypercholesterolemia, unspecified: Secondary | ICD-10-CM | POA: Diagnosis not present

## 2021-03-08 DIAGNOSIS — H409 Unspecified glaucoma: Secondary | ICD-10-CM | POA: Diagnosis not present

## 2021-03-16 DIAGNOSIS — I878 Other specified disorders of veins: Secondary | ICD-10-CM | POA: Diagnosis not present

## 2021-03-19 DIAGNOSIS — N130 Hydronephrosis with ureteropelvic junction obstruction: Secondary | ICD-10-CM | POA: Diagnosis not present

## 2021-03-23 DIAGNOSIS — G250 Essential tremor: Secondary | ICD-10-CM | POA: Diagnosis not present

## 2021-03-23 DIAGNOSIS — Z79899 Other long term (current) drug therapy: Secondary | ICD-10-CM | POA: Diagnosis not present

## 2021-03-23 DIAGNOSIS — Z0000 Encounter for general adult medical examination without abnormal findings: Secondary | ICD-10-CM | POA: Diagnosis not present

## 2021-03-23 DIAGNOSIS — D473 Essential (hemorrhagic) thrombocythemia: Secondary | ICD-10-CM | POA: Diagnosis not present

## 2021-03-23 DIAGNOSIS — E7800 Pure hypercholesterolemia, unspecified: Secondary | ICD-10-CM | POA: Diagnosis not present

## 2021-03-23 DIAGNOSIS — D649 Anemia, unspecified: Secondary | ICD-10-CM | POA: Diagnosis not present

## 2021-03-23 DIAGNOSIS — I700 Atherosclerosis of aorta: Secondary | ICD-10-CM | POA: Diagnosis not present

## 2021-03-23 DIAGNOSIS — J479 Bronchiectasis, uncomplicated: Secondary | ICD-10-CM | POA: Diagnosis not present

## 2021-03-23 DIAGNOSIS — R161 Splenomegaly, not elsewhere classified: Secondary | ICD-10-CM | POA: Diagnosis not present

## 2021-04-08 ENCOUNTER — Encounter (HOSPITAL_BASED_OUTPATIENT_CLINIC_OR_DEPARTMENT_OTHER): Payer: Self-pay | Admitting: Emergency Medicine

## 2021-04-08 ENCOUNTER — Emergency Department (HOSPITAL_BASED_OUTPATIENT_CLINIC_OR_DEPARTMENT_OTHER)
Admission: EM | Admit: 2021-04-08 | Discharge: 2021-04-08 | Disposition: A | Discharge status: Home / Self Care | Payer: Medicare Other | Attending: Emergency Medicine | Admitting: Emergency Medicine

## 2021-04-08 ENCOUNTER — Emergency Department (HOSPITAL_BASED_OUTPATIENT_CLINIC_OR_DEPARTMENT_OTHER): Payer: Medicare Other

## 2021-04-08 ENCOUNTER — Other Ambulatory Visit: Payer: Self-pay

## 2021-04-08 DIAGNOSIS — D72829 Elevated white blood cell count, unspecified: Secondary | ICD-10-CM | POA: Insufficient documentation

## 2021-04-08 DIAGNOSIS — K5730 Diverticulosis of large intestine without perforation or abscess without bleeding: Secondary | ICD-10-CM | POA: Diagnosis not present

## 2021-04-08 DIAGNOSIS — R1031 Right lower quadrant pain: Secondary | ICD-10-CM | POA: Insufficient documentation

## 2021-04-08 DIAGNOSIS — N200 Calculus of kidney: Secondary | ICD-10-CM | POA: Diagnosis not present

## 2021-04-08 DIAGNOSIS — Z7982 Long term (current) use of aspirin: Secondary | ICD-10-CM | POA: Diagnosis not present

## 2021-04-08 LAB — CBC
HCT: 43.4 % (ref 36.0–46.0)
Hemoglobin: 13.4 g/dL (ref 12.0–15.0)
MCH: 27.3 pg (ref 26.0–34.0)
MCHC: 30.9 g/dL (ref 30.0–36.0)
MCV: 88.6 fL (ref 80.0–100.0)
Platelets: 344 10*3/uL (ref 150–400)
RBC: 4.9 MIL/uL (ref 3.87–5.11)
RDW: 19 % — ABNORMAL HIGH (ref 11.5–15.5)
WBC: 16 10*3/uL — ABNORMAL HIGH (ref 4.0–10.5)
nRBC: 0 % (ref 0.0–0.2)

## 2021-04-08 LAB — URINALYSIS, ROUTINE W REFLEX MICROSCOPIC
Bilirubin Urine: NEGATIVE
Glucose, UA: NEGATIVE mg/dL
Hgb urine dipstick: NEGATIVE
Ketones, ur: NEGATIVE mg/dL
Leukocytes,Ua: NEGATIVE
Nitrite: NEGATIVE
Specific Gravity, Urine: 1.014 (ref 1.005–1.030)
pH: 6 (ref 5.0–8.0)

## 2021-04-08 LAB — COMPREHENSIVE METABOLIC PANEL
ALT: 10 U/L (ref 0–44)
AST: 14 U/L — ABNORMAL LOW (ref 15–41)
Albumin: 4.8 g/dL (ref 3.5–5.0)
Alkaline Phosphatase: 48 U/L (ref 38–126)
Anion gap: 11 (ref 5–15)
BUN: 30 mg/dL — ABNORMAL HIGH (ref 8–23)
CO2: 25 mmol/L (ref 22–32)
Calcium: 9.9 mg/dL (ref 8.9–10.3)
Chloride: 103 mmol/L (ref 98–111)
Creatinine, Ser: 1.02 mg/dL — ABNORMAL HIGH (ref 0.44–1.00)
GFR, Estimated: 54 mL/min — ABNORMAL LOW (ref >60)
Glucose, Bld: 108 mg/dL — ABNORMAL HIGH (ref 70–99)
Potassium: 4.6 mmol/L (ref 3.5–5.1)
Sodium: 139 mmol/L (ref 135–145)
Total Bilirubin: 0.7 mg/dL (ref 0.3–1.2)
Total Protein: 7.8 g/dL (ref 6.5–8.1)

## 2021-04-08 LAB — LIPASE, BLOOD: Lipase: 39 U/L (ref 11–51)

## 2021-04-08 MED ORDER — IOHEXOL 9 MG/ML PO SOLN
500.0000 mL | ORAL | Status: AC
  Administered 2021-04-08 (×2): 500 mL via ORAL

## 2021-04-08 MED ORDER — IOHEXOL 300 MG/ML  SOLN
100.0000 mL | Freq: Once | INTRAMUSCULAR | Status: AC | PRN
  Administered 2021-04-08: 75 mL via INTRAVENOUS

## 2021-04-08 NOTE — ED Triage Notes (Signed)
Pt from home c/o abdominal pain LRQ that started about 0900 this morning. Pt stated that she took two tylenol this morning and the pain went way. Pt denis Nausea and vomiting. ?

## 2021-04-08 NOTE — ED Notes (Signed)
Patient transported to CT 

## 2021-04-08 NOTE — Discharge Instructions (Signed)
Your work-up today has been generally reassuring.  You have a small amount of protein in your urine which your primary care provider can follow-up on.  I am glad that your symptoms completely resolved with some Tylenol.  Make sure you are drinking plenty water taking her medications and eating regular meals. ? ?If you develop a fever, develop worsening/persistent pain or any other new or concerning symptoms may always return to the emergency room I would specifically recommend [facility] and I have given you the address that hospital. ? ?Please continue to follow-up with urology ?

## 2021-04-08 NOTE — ED Provider Notes (Signed)
?[facility] ?Provider Note ? ? ?CSN: [id number] ?Arrival date & time: 04/08/21  1510 ? ?  ? ?History ? ?Chief Complaint  ?Patient presents with  ? Abdominal Pain  ? ? ?Chelsea Garcia is a 88 y.o. female. ? ? ?Abdominal Pain ? ?Patient is an 88 year old female with past medical history detailed below she is presented emergency room today for abdominal pain that began at 9 AM this morning and seemed to resolve about an hour later after 1 dose of Tylenol.  She called her PCP who recommended she come to the emergency room for evaluation.  She describes it as right lower quadrant in location she states it was persistent for approximate 1 hour moderate she denies any nausea vomiting blood in her stool diarrhea constipation chest pain difficulty breathing lightheadedness or dizziness.  She does not have any upper abdominal pain no fevers no cough congestion no hemoptysis ? ?Denies any lower extremity swelling and states that she is symptom-free currently. ? ?  ? ?Home Medications ?Prior to Admission medications   ?Medication Sig Start Date End Date Taking? Authorizing Provider  ?acetaminophen (TYLENOL) 500 MG tablet Take 500 mg by mouth every 6 (six) hours as needed for moderate pain.    [provider]  ?aspirin EC 81 MG tablet Take 81 mg by mouth daily. Swallow whole.    [provider]  ?bismuth subsalicylate (PEPTO BISMOL) 262 MG/15ML suspension Take 30 mLs by mouth every 6 (six) hours as needed.    [provider]  ?brimonidine (ALPHAGAN) 0.2 % ophthalmic solution Place 1 drop into both eyes 2 (two) times daily. 08/17/20   [provider]  ?calcium carbonate (OS-CAL) 1250 (500 Ca) MG chewable tablet Chew 1 tablet by mouth daily.    [provider]  ?fluticasone-salmeterol (WIXELA INHUB) 100-50 MCG/ACT AEPB Inhale 1 puff into the lungs 2 (two) times daily. in the morning and at bedtime. 02/05/21   Margaretha Seeds, MD  ?Latanoprostene Bunod (VYZULTA)  0.024 % SOLN Apply 1 drop to eye every evening. Both eyes    [provider]  ?loperamide (IMODIUM A-D) 2 MG tablet Take 2 mg by mouth 4 (four) times daily as needed for diarrhea or loose stools.    [provider]  ?Multiple Vitamin (MULTI VITAMIN) TABS Take 2 tablets by mouth daily.    [provider]  ?Netarsudil Dimesylate (RHOPRESSA) 0.02 % SOLN Apply 1 drop to eye every evening. Both eyes    [provider]  ?Polyethyl Glycol-Propyl Glycol (SYSTANE OP) Apply to eye. Uses eye drops prn for dry eye    [provider]  ?   ? ?Allergies    ?Nystatin, Bee pollen, Pollen extract, Albuterol, Doxycycline, Mangifera indica fruit ext (mango) [mangifera indica], Mite (d. farinae), and Sulfamethoxazole   ? ?Review of Systems   ?Review of Systems  ?Gastrointestinal:  Positive for abdominal pain.  ? ?Physical Exam ?Updated Vital Signs ?BP 128/66 (BP Location: Right Arm)   Pulse 81   Temp 98.4 ?F (36.9 ?C) (Oral)   Resp 19   Ht 5' (1.524 m)   Wt 49.9 kg   SpO2 94%   BMI 21.48 kg/m?  ?Physical Exam ?Vitals and nursing note reviewed.  ?Constitutional:   ?   General: She is not in acute distress. ?HENT:  ?   Head: Normocephalic and atraumatic.  ?   Nose: Nose normal.  ?Eyes:  ?   General: No scleral icterus. ?Cardiovascular:  ?   Rate  and Rhythm: Normal rate and regular rhythm.  ?   Pulses: Normal pulses.  ?   Heart sounds: Normal heart sounds.  ?Pulmonary:  ?   Effort: Pulmonary effort is normal. No respiratory distress.  ?   Breath sounds: No wheezing.  ?Abdominal:  ?   Palpations: Abdomen is soft.  ?   Tenderness: There is abdominal tenderness in the right lower quadrant.  ?   Comments: Right lower quadrant of the abdomen is tender with deep palpation.  No guarding or rebound.  ?Musculoskeletal:  ?   Cervical back: Normal range of motion.  ?   Right lower leg: No edema.  ?   Left lower leg: No edema.  ?Skin: ?   General: Skin is warm and dry.  ?   Capillary Refill:  Capillary refill takes less than 2 seconds.  ?Neurological:  ?   Mental Status: She is alert. Mental status is at baseline.  ?Psychiatric:     ?   Mood and Affect: Mood normal.     ?   Behavior: Behavior normal.  ? ? ?ED Results / Procedures / Treatments   ?Labs ?(all labs ordered are listed, but only abnormal results are displayed) ?Labs Reviewed  ?COMPREHENSIVE METABOLIC PANEL - Abnormal; Notable for the following components:  ?    Result Value  ? Glucose, Bld 108 (*)   ? BUN 30 (*)   ? Creatinine, Ser 1.02 (*)   ? AST 14 (*)   ? GFR, Estimated 54 (*)   ? All other components within normal limits  ?CBC - Abnormal; Notable for the following components:  ? WBC 16.0 (*)   ? RDW 19.0 (*)   ? All other components within normal limits  ?URINALYSIS, ROUTINE W REFLEX MICROSCOPIC - Abnormal; Notable for the following components:  ? Protein, ur TRACE (*)   ? All other components within normal limits  ?LIPASE, BLOOD  ? ? ?EKG ?None ? ?Radiology ?CT Abdomen Pelvis W Contrast ? ?Addendum Date: 04/08/2021   ?ADDENDUM REPORT: 04/08/2021 19:36 ADDENDUM: These results were called by telephone at the time of interpretation on 04/08/2021 at 7:29 pm to provider PA [facility] Kermit Arnette , who verbally acknowledged these results. Electronically Signed   By: Iven Finn M.D.   On: 04/08/2021 19:36  ? ?Result Date: 04/08/2021 ?CLINICAL DATA:  RLQ abdominal pain (Age >= 14y). Pt from home c/o abdominal pain LRQ that started about 0900 this morning. Pt stated that she took two tylenol this morning and the pain went way. Pt denies nausea and vomiting. EXAM: CT ABDOMEN AND PELVIS WITH CONTRAST TECHNIQUE: Multidetector CT imaging of the abdomen and pelvis was performed using the standard protocol following bolus administration of intravenous contrast. RADIATION DOSE REDUCTION: This exam was performed according to the departmental dose-optimization program which includes automated exposure control, adjustment of the mA and/or kV according to patient  size and/or use of iterative reconstruction technique. CONTRAST:  49m OMNIPAQUE IOHEXOL 300 MG/ML  SOLN COMPARISON:  CT abdomen pelvis 11/21/2020, CT abdomen pelvis 10/17/2020 FINDINGS: Lower chest: Enlarged heart size.  No acute abnormality. Hepatobiliary: The liver is enlarged measuring 19.5 cm. No focal liver abnormality. No gallstones, gallbladder wall thickening, or pericholecystic fluid. No biliary dilatation. Pancreas: No focal lesion. Normal pancreatic contour. No surrounding inflammatory changes. No main pancreatic ductal dilatation. Spleen: The spleen is stable and enlarged measuring up to 18 cm. No focal lesion. Adrenals/Urinary Tract: No adrenal nodule bilaterally. Bilateral kidneys enhance symmetrically. Persistent bilateral severe hydronephrosis within  a transition point again noted at the ureteropelvic junctions. Left nephrolithiasis measuring up to 5 mm. Subcentimeter hypodensities too small to characterize. No ureterolithiasis.  No hydroureter. The urinary bladder is unremarkable. Stomach/Bowel: PO contrast reaches the rectum. Stomach is within normal limits. No evidence of bowel wall thickening or dilatation. Scattered colonic diverticulosis. Appendix appears normal. Vascular/Lymphatic: Left upper quadrant venous collaterals. Enlarged portal and splenic veins with the portal vein measuring up to 2.4 cm. No definite thrombosis. No abdominal aorta or iliac aneurysm. Mild atherosclerotic plaque of the aorta and its branches. No abdominal, pelvic, or inguinal lymphadenopathy. Reproductive: Status post hysterectomy. No adnexal masses. Other: Nonspecific trace right lower quadrant fat stranding within the right lower quadrant (5:44). No intraperitoneal free fluid. No intraperitoneal free gas. No organized fluid collection. Musculoskeletal: No abdominal wall hernia or abnormality. Laxity of the pelvic floor. No suspicious lytic or blastic osseous lesions. No acute displaced fracture. Multilevel  degenerative changes of the spine. IMPRESSION: 1. Nonspecific trace right lower quadrant fat stranding within the right lower quadrant 2. Cardiomegaly. 3. Stable hepatosplenomegaly with venous collaterals and enlarged portal

## 2021-04-12 DIAGNOSIS — R922 Inconclusive mammogram: Secondary | ICD-10-CM | POA: Diagnosis not present

## 2021-04-12 DIAGNOSIS — R928 Other abnormal and inconclusive findings on diagnostic imaging of breast: Secondary | ICD-10-CM | POA: Diagnosis not present

## 2021-04-14 DIAGNOSIS — N130 Hydronephrosis with ureteropelvic junction obstruction: Secondary | ICD-10-CM | POA: Diagnosis not present

## 2021-04-27 DIAGNOSIS — M85851 Other specified disorders of bone density and structure, right thigh: Secondary | ICD-10-CM | POA: Diagnosis not present

## 2021-04-27 DIAGNOSIS — M810 Age-related osteoporosis without current pathological fracture: Secondary | ICD-10-CM | POA: Diagnosis not present

## 2021-04-27 DIAGNOSIS — Z780 Asymptomatic menopausal state: Secondary | ICD-10-CM | POA: Diagnosis not present

## 2021-04-27 DIAGNOSIS — M85852 Other specified disorders of bone density and structure, left thigh: Secondary | ICD-10-CM | POA: Diagnosis not present

## 2021-04-28 DIAGNOSIS — Z23 Encounter for immunization: Secondary | ICD-10-CM | POA: Diagnosis not present

## 2021-05-06 ENCOUNTER — Inpatient Hospital Stay: Payer: Medicare Other | Admitting: Hematology

## 2021-05-06 ENCOUNTER — Inpatient Hospital Stay: Payer: Medicare Other

## 2021-05-06 DIAGNOSIS — D473 Essential (hemorrhagic) thrombocythemia: Secondary | ICD-10-CM

## 2021-05-07 DIAGNOSIS — J479 Bronchiectasis, uncomplicated: Secondary | ICD-10-CM | POA: Diagnosis not present

## 2021-05-07 DIAGNOSIS — M8580 Other specified disorders of bone density and structure, unspecified site: Secondary | ICD-10-CM | POA: Diagnosis not present

## 2021-05-07 DIAGNOSIS — E7800 Pure hypercholesterolemia, unspecified: Secondary | ICD-10-CM | POA: Diagnosis not present

## 2021-05-11 DIAGNOSIS — L03119 Cellulitis of unspecified part of limb: Secondary | ICD-10-CM | POA: Diagnosis not present

## 2021-05-26 ENCOUNTER — Ambulatory Visit: Payer: Medicare Other | Admitting: Podiatry

## 2021-05-26 DIAGNOSIS — H401133 Primary open-angle glaucoma, bilateral, severe stage: Secondary | ICD-10-CM | POA: Diagnosis not present

## 2021-05-26 DIAGNOSIS — H52203 Unspecified astigmatism, bilateral: Secondary | ICD-10-CM | POA: Diagnosis not present

## 2021-05-26 DIAGNOSIS — H01005 Unspecified blepharitis left lower eyelid: Secondary | ICD-10-CM | POA: Diagnosis not present

## 2021-05-26 DIAGNOSIS — H01002 Unspecified blepharitis right lower eyelid: Secondary | ICD-10-CM | POA: Diagnosis not present

## 2021-05-27 DIAGNOSIS — L03115 Cellulitis of right lower limb: Secondary | ICD-10-CM | POA: Diagnosis not present

## 2021-06-01 ENCOUNTER — Inpatient Hospital Stay: Payer: Medicare Other | Admitting: Hematology

## 2021-06-01 ENCOUNTER — Inpatient Hospital Stay: Payer: Medicare Other

## 2021-06-01 DIAGNOSIS — D473 Essential (hemorrhagic) thrombocythemia: Secondary | ICD-10-CM

## 2021-06-03 DIAGNOSIS — E7800 Pure hypercholesterolemia, unspecified: Secondary | ICD-10-CM | POA: Diagnosis not present

## 2021-06-03 DIAGNOSIS — M8580 Other specified disorders of bone density and structure, unspecified site: Secondary | ICD-10-CM | POA: Diagnosis not present

## 2021-06-03 DIAGNOSIS — J479 Bronchiectasis, uncomplicated: Secondary | ICD-10-CM | POA: Diagnosis not present

## 2021-06-03 DIAGNOSIS — M810 Age-related osteoporosis without current pathological fracture: Secondary | ICD-10-CM | POA: Diagnosis not present

## 2021-06-03 DIAGNOSIS — I8310 Varicose veins of unspecified lower extremity with inflammation: Secondary | ICD-10-CM | POA: Diagnosis not present

## 2021-06-06 DIAGNOSIS — I776 Arteritis, unspecified: Secondary | ICD-10-CM | POA: Diagnosis not present

## 2021-06-16 DIAGNOSIS — I890 Lymphedema, not elsewhere classified: Secondary | ICD-10-CM | POA: Diagnosis not present

## 2021-06-16 DIAGNOSIS — L03115 Cellulitis of right lower limb: Secondary | ICD-10-CM | POA: Diagnosis not present

## 2021-06-16 DIAGNOSIS — L03116 Cellulitis of left lower limb: Secondary | ICD-10-CM | POA: Diagnosis not present

## 2021-06-22 ENCOUNTER — Encounter: Payer: Self-pay | Admitting: Podiatry

## 2021-06-22 ENCOUNTER — Ambulatory Visit (INDEPENDENT_AMBULATORY_CARE_PROVIDER_SITE_OTHER): Payer: Medicare Other | Admitting: Podiatry

## 2021-06-22 DIAGNOSIS — D473 Essential (hemorrhagic) thrombocythemia: Secondary | ICD-10-CM

## 2021-06-22 DIAGNOSIS — M79609 Pain in unspecified limb: Secondary | ICD-10-CM

## 2021-06-22 DIAGNOSIS — B351 Tinea unguium: Secondary | ICD-10-CM

## 2021-06-22 NOTE — Progress Notes (Signed)
This patient returns to the office for evaluation and treatment of long thick painful nails .  This patient is unable to trim her own nails since the patient cannot reach her feet.  Patient says the nails are painful walking and wearing his shoes.  She returns for preventive foot care services.  General Appearance  Alert, conversant and in no acute stress.  Vascular  Dorsalis pedis and posterior tibial  pulses are weakly  palpable  bilaterally.  Capillary return is within normal limits  bilaterally. Temperature is within normal limits  bilaterally.  Neurologic  Senn-Weinstein monofilament wire test within normal limits  bilaterally. Muscle power within normal limits bilaterally.  Nails Thick disfigured discolored nails with subungual debris  from hallux to fifth toes bilaterally. No evidence of bacterial infection or drainage bilaterally.  Orthopedic  No limitations of motion  feet .  No crepitus or effusions noted.  No bony pathology or digital deformities noted.  Skin  normotropic skin with no porokeratosis noted bilaterally.  No signs of infections or ulcers noted.     Onychomycosis  Pain in toes right foot  Pain in toes left foot  Debridement  of nails  1-5  B/L with a nail nipper.  Nails were then filed using a dremel tool with no incidents.    RTC  12 weeks    Eustacio Ellen DPM  

## 2021-07-07 ENCOUNTER — Inpatient Hospital Stay: Payer: Medicare Other

## 2021-07-07 ENCOUNTER — Inpatient Hospital Stay: Payer: Medicare Other | Admitting: Hematology

## 2021-07-07 ENCOUNTER — Telehealth: Payer: Self-pay | Admitting: Hematology

## 2021-07-07 DIAGNOSIS — D473 Essential (hemorrhagic) thrombocythemia: Secondary | ICD-10-CM

## 2021-07-07 NOTE — Telephone Encounter (Signed)
.  Called patient to schedule appointment per 6/28 inbasket, patient is aware of date and time.   

## 2021-07-20 DIAGNOSIS — M810 Age-related osteoporosis without current pathological fracture: Secondary | ICD-10-CM | POA: Diagnosis not present

## 2021-07-20 DIAGNOSIS — L03115 Cellulitis of right lower limb: Secondary | ICD-10-CM | POA: Diagnosis not present

## 2021-07-20 DIAGNOSIS — E038 Other specified hypothyroidism: Secondary | ICD-10-CM | POA: Diagnosis not present

## 2021-07-21 DIAGNOSIS — N130 Hydronephrosis with ureteropelvic junction obstruction: Secondary | ICD-10-CM | POA: Diagnosis not present

## 2021-07-27 ENCOUNTER — Inpatient Hospital Stay: Payer: Medicare Other | Attending: Hematology

## 2021-07-27 ENCOUNTER — Other Ambulatory Visit: Payer: Self-pay

## 2021-07-27 ENCOUNTER — Encounter: Payer: Self-pay | Admitting: Hematology

## 2021-07-27 ENCOUNTER — Inpatient Hospital Stay (HOSPITAL_BASED_OUTPATIENT_CLINIC_OR_DEPARTMENT_OTHER): Payer: Medicare Other | Admitting: Hematology

## 2021-07-27 VITALS — BP 134/58 | HR 65 | Temp 98.0°F | Resp 18 | Ht 60.0 in | Wt 109.7 lb

## 2021-07-27 DIAGNOSIS — K219 Gastro-esophageal reflux disease without esophagitis: Secondary | ICD-10-CM | POA: Insufficient documentation

## 2021-07-27 DIAGNOSIS — D7581 Myelofibrosis: Secondary | ICD-10-CM | POA: Diagnosis not present

## 2021-07-27 DIAGNOSIS — Z8719 Personal history of other diseases of the digestive system: Secondary | ICD-10-CM | POA: Insufficient documentation

## 2021-07-27 DIAGNOSIS — K589 Irritable bowel syndrome without diarrhea: Secondary | ICD-10-CM | POA: Insufficient documentation

## 2021-07-27 DIAGNOSIS — R161 Splenomegaly, not elsewhere classified: Secondary | ICD-10-CM | POA: Diagnosis not present

## 2021-07-27 DIAGNOSIS — Z79899 Other long term (current) drug therapy: Secondary | ICD-10-CM | POA: Diagnosis not present

## 2021-07-27 DIAGNOSIS — M419 Scoliosis, unspecified: Secondary | ICD-10-CM | POA: Insufficient documentation

## 2021-07-27 DIAGNOSIS — D473 Essential (hemorrhagic) thrombocythemia: Secondary | ICD-10-CM | POA: Diagnosis not present

## 2021-07-27 DIAGNOSIS — Z881 Allergy status to other antibiotic agents status: Secondary | ICD-10-CM | POA: Diagnosis not present

## 2021-07-27 DIAGNOSIS — Z882 Allergy status to sulfonamides status: Secondary | ICD-10-CM | POA: Diagnosis not present

## 2021-07-27 DIAGNOSIS — Z91030 Bee allergy status: Secondary | ICD-10-CM | POA: Insufficient documentation

## 2021-07-27 LAB — CBC WITH DIFFERENTIAL (CANCER CENTER ONLY)
Abs Immature Granulocytes: 1.04 10*3/uL — ABNORMAL HIGH (ref 0.00–0.07)
Basophils Absolute: 0.1 10*3/uL (ref 0.0–0.1)
Basophils Relative: 1 %
Eosinophils Absolute: 0.1 10*3/uL (ref 0.0–0.5)
Eosinophils Relative: 1 %
HCT: 37.1 % (ref 36.0–46.0)
Hemoglobin: 12.1 g/dL (ref 12.0–15.0)
Immature Granulocytes: 7 %
Lymphocytes Relative: 5 %
Lymphs Abs: 0.8 10*3/uL (ref 0.7–4.0)
MCH: 29.5 pg (ref 26.0–34.0)
MCHC: 32.6 g/dL (ref 30.0–36.0)
MCV: 90.5 fL (ref 80.0–100.0)
Monocytes Absolute: 0.9 10*3/uL (ref 0.1–1.0)
Monocytes Relative: 6 %
Neutro Abs: 12.1 10*3/uL — ABNORMAL HIGH (ref 1.7–7.7)
Neutrophils Relative %: 80 %
Platelet Count: 301 10*3/uL (ref 150–400)
RBC: 4.1 MIL/uL (ref 3.87–5.11)
RDW: 18.5 % — ABNORMAL HIGH (ref 11.5–15.5)
Smear Review: NORMAL
WBC Count: 14.9 10*3/uL — ABNORMAL HIGH (ref 4.0–10.5)
nRBC: 0.2 % (ref 0.0–0.2)

## 2021-07-27 NOTE — Progress Notes (Signed)
[facility]   Telephone:([phone] Fax:([phone]) [phone]   Clinic Follow up Note   Patient Care Team: Lujean Amel, MD as PCP - General (Family Medicine) Bernadene Bell, MD (Inactive) as Consulting Physician (Hematology)  Date of Service:  07/27/2021  CHIEF COMPLAINT: f/u of essential thrombocytosis  CURRENT THERAPY:  Surveillance  ASSESSMENT & PLAN:  Chelsea Garcia is a 88 y.o. female with   1. Essential thrombocytosis, JAK2(+) -Diagnosed in 11/2013 with plt initially in 600k range. Due to her advanced age, she is at high risk for thrombosis secondary to ET. I previously discussed the possibility of her ET evolving to myelofibrosis leukemia, so far her WBC has been normal, if the concerns arises in future, I may recommend repeating bone marrow biopsy to further evaluate.  -She had been taking hydrea since 2015, mostly at low dose and tolerated well. Given the good control of her ET, we stopped her hydrea since 02/05/20. If her platelets increase over 500K, I would recommend restarting hydrea. She is agreeable.  -She is clinically doing well and stable. CBC today shows white count of 14.9, hematocrit 37%, platelets 301. Overall ET remains controlled off Hydrea. I recommend she take baby aspirin.  -Will continue to monitor.  She has been getting a lot of lab with other physicians, I will see her back in 1 year.   2. IBS, Acid Reflux, Scoliosis   -Her CT CAP from 03/2017 showed diverticulosis and moderate splenomegaly. -Her IBS is overall mild and manageable       PLAN: -continue baby Aspirin alone --lab and f/u in one year.  She knows to watch her CBC and call me if she has significant elevated blood counts.   No problem-specific Assessment & Plan notes found for this encounter.   SUMMARY OF ONCOLOGIC HISTORY: Oncology History  Essential thrombocytosis ([place])  10/01/2013 Initial Diagnosis   Essential thrombocytosis    Prior Therapy: Hydroxyurea 533m daily  started 11/27/2013. Decreased to 5086mM-F in 12/2017, further decrease to 5006mWF on 07/06/2018. Reduced to 500m26m 2 days a week starting 12/25/18. Reduced to 500mg21me weekly starting 03/18/19. Held since 02/05/20 due to well controlled ET.    INTERVAL HISTORY:  Chelsea Garcia for a follow up of ET. She was last seen by me on 11/05/20. She presents to the clinic alone.  She has had multiple ED visit in the past 10 months, for various reasons. She had a bilateral UPJ obstruction, had a stent placed by urologist Dr. EstriPatsy [place] they wre removed 3 months ago. She state she is doing well overall, no pain, leg edema, or other new symptoms.    All other systems were reviewed with the patient and are negative. MEDICAL HISTORY:  Past Medical History:  Diagnosis Date   [facility]   Cellulitis of right foot    recurrent cellulitis last 7 days finishes ciprofloxacin for on 11-26-2020   Colon polyps    Benign. 2 colonoscopies in past. Due now again.   Complication of anesthesia    "got dizzy in 70's"   Diarrhea    resolved now taking imodium ad prn   Dry eye 11/25/2020   both eyes   Endometriosis    required TAH when young   Essential tremor    since 40's   GERD (gastroesophageal reflux disease)    denis   Glaucoma    started in 40's both eyes  Low back pain    since 40's   Migraine    started in 50's last migraine 10 or 20 yrs ago per pt on 11-25-2020   Osteopenia    2014   Seasonal allergies    to Dust, mold and pollen.   Thrombocytosis 09/2013   hx of resolved follows up with dr Truitt Merle oncology   Tick bites    she likes out door activities, 2 this year (2015)   UPJ (ureteropelvic junction) obstruction 11/25/2020   left   Urinary frequency    Wears dentures    upper   Wears glasses     SURGICAL HISTORY: Past Surgical History:  Procedure Laterality Date   CATARACT EXTRACTION Left  02/07/2013   right 2015   colonscopy     10 or 15 yrs ago per pt on 11-25-2020   CYSTOSCOPY W/ URETERAL STENT PLACEMENT Left 11/26/2020   Procedure: CYSTOSCOPY WITH BILATERAL RETROGRADE PYELOGRAM/URETERAL STENT PLACEMENT;  Surgeon: Festus Aloe, MD;  Location: [facility];  Service: Urology;  Laterality: Left;  ONLY NEEDS 30 MIN   CYSTOSCOPY WITH URETEROSCOPY AND STENT PLACEMENT Left 01/12/2021   Procedure: CYSTOSCOPY WITH URETEROSCOPY AND STENT EXCHANGE;  Surgeon: Festus Aloe, MD;  Location: [facility];  Service: Urology;  Laterality: Left;   INGUINAL HERNIA REPAIR Left 01/09/2020   Procedure: LEFT INGUINAL HERNIA REPAIR WITH MESH;  Surgeon: Erroll Luna, MD;  Location: [facility];  Service: General;  Laterality: Left;   INSERTION OF MESH Left 01/09/2020   Procedure: INSERTION OF MESH;  Surgeon: Erroll Luna, MD;  Location: [facility];  Service: General;  Laterality: Left;   TOTAL ABDOMINAL HYSTERECTOMY  09/10/1968   TUBAL LIGATION     yrs ago    I have reviewed the social history and family history with the patient and they are unchanged from previous note.  ALLERGIES:  is allergic to nystatin, bee pollen, pollen extract, albuterol, doxycycline, mangifera indica fruit ext (mango) [mangifera indica], mite (d. farinae), and sulfamethoxazole.  MEDICATIONS:  Current Outpatient Medications  Medication Sig Dispense Refill   acetaminophen (TYLENOL) 500 MG tablet Take 500 mg by mouth every 6 (six) hours as needed for moderate pain.     aspirin EC 81 MG tablet Take 81 mg by mouth daily. Swallow whole.     bismuth subsalicylate (PEPTO BISMOL) 262 MG/15ML suspension Take 30 mLs by mouth every 6 (six) hours as needed.     brimonidine (ALPHAGAN) 0.2 % ophthalmic solution Place 1 drop into both eyes 2 (two) times daily.     calcium carbonate (OS-CAL) 1250 (500 Ca) MG chewable tablet Chew 1 tablet by mouth daily.      fluticasone-salmeterol (WIXELA INHUB) 100-50 MCG/ACT AEPB Inhale 1 puff into the lungs 2 (two) times daily. in the morning and at bedtime. 60 each 11   Latanoprostene Bunod (VYZULTA) 0.024 % SOLN Apply 1 drop to eye every evening. Both eyes     loperamide (IMODIUM A-D) 2 MG tablet Take 2 mg by mouth 4 (four) times daily as needed for diarrhea or loose stools.     Multiple Vitamin (MULTI VITAMIN) TABS Take 2 tablets by mouth daily.     Netarsudil Dimesylate (RHOPRESSA) 0.02 % SOLN Apply 1 drop to eye every evening. Both eyes     Polyethyl Glycol-Propyl Glycol (SYSTANE OP) Apply to eye. Uses eye drops prn for dry eye     No current facility-administered medications for this visit.  PHYSICAL EXAMINATION: ECOG PERFORMANCE STATUS: 1 - Symptomatic but completely ambulatory  Vitals:   07/27/21 1403  BP: (!) 134/58  Pulse: 65  Resp: 18  Temp: 98 F (36.7 C)  SpO2: 97%   Wt Readings from Last 3 Encounters:  07/27/21 109 lb 11.2 oz (49.8 kg)  04/08/21 110 lb (49.9 kg)  02/05/21 110 lb 6.4 oz (50.1 kg)     GENERAL:alert, no distress and comfortable SKIN: skin color, texture, turgor are normal, no rashes or significant lesions EYES: normal, Conjunctiva are pink and non-injected, sclera clear Musculoskeletal:no cyanosis of digits and no clubbing  NEURO: alert & oriented x 3 with fluent speech, no focal motor/sensory deficits  LABORATORY DATA:  I have reviewed the data as listed    Latest Ref Rng & Units 07/27/2021    1:37 PM 04/08/2021    4:00 PM 02/25/2021    3:28 PM  CBC  WBC 4.0 - 10.5 K/uL 14.9  16.0  12.2   Hemoglobin 12.0 - 15.0 g/dL 12.1  13.4  12.2   Hematocrit 36.0 - 46.0 % 37.1  43.4  38.6   Platelets 150 - 400 K/uL 301  344  350         Latest Ref Rng & Units 04/08/2021    4:00 PM 02/25/2021    3:28 PM 12/07/2020   12:02 AM  CMP  Glucose 70 - 99 mg/dL 108  98  107   BUN 8 - 23 mg/dL 30  29  27   Creatinine 0.44 - 1.00 mg/dL 1.02  0.98  0.94   Sodium 135 - 145  mmol/L 139  139  140   Potassium 3.5 - 5.1 mmol/L 4.6  4.4  4.3   Chloride 98 - 111 mmol/L 103  106  107   CO2 22 - 32 mmol/L 25  26  26   Calcium 8.9 - 10.3 mg/dL 9.9  9.4  9.6   Total Protein 6.5 - 8.1 g/dL 7.8  7.4    Total Bilirubin 0.3 - 1.2 mg/dL 0.7  0.6    Alkaline Phos 38 - 126 U/L 48  43    AST 15 - 41 U/L 14  15    ALT 0 - 44 U/L 10  12        RADIOGRAPHIC STUDIES: I have personally reviewed the radiological images as listed and agreed with the findings in the report. No results found.    No orders of the defined types were placed in this encounter.  All questions were answered. The patient knows to call the clinic with any problems, questions or concerns. No barriers to learning was detected. The total time spent in the appointment was 20 minutes.     Yan Feng, MD 07/27/2021   I, Katie Daubenspeck, am acting as scribe for Yan Feng, MD.   I have reviewed the above documentation for accuracy and completeness, and I agree with the above.     

## 2021-07-28 DIAGNOSIS — M419 Scoliosis, unspecified: Secondary | ICD-10-CM | POA: Diagnosis not present

## 2021-07-28 DIAGNOSIS — M40209 Unspecified kyphosis, site unspecified: Secondary | ICD-10-CM | POA: Diagnosis not present

## 2021-08-07 DIAGNOSIS — L0390 Cellulitis, unspecified: Secondary | ICD-10-CM | POA: Diagnosis not present

## 2021-08-11 DIAGNOSIS — E038 Other specified hypothyroidism: Secondary | ICD-10-CM | POA: Diagnosis not present

## 2021-08-11 DIAGNOSIS — M810 Age-related osteoporosis without current pathological fracture: Secondary | ICD-10-CM | POA: Diagnosis not present

## 2021-08-13 DIAGNOSIS — R8271 Bacteriuria: Secondary | ICD-10-CM | POA: Diagnosis not present

## 2021-08-13 DIAGNOSIS — R1084 Generalized abdominal pain: Secondary | ICD-10-CM | POA: Diagnosis not present

## 2021-08-13 DIAGNOSIS — R110 Nausea: Secondary | ICD-10-CM | POA: Diagnosis not present

## 2021-08-13 DIAGNOSIS — K5730 Diverticulosis of large intestine without perforation or abscess without bleeding: Secondary | ICD-10-CM | POA: Diagnosis not present

## 2021-08-13 DIAGNOSIS — R161 Splenomegaly, not elsewhere classified: Secondary | ICD-10-CM | POA: Diagnosis not present

## 2021-08-13 DIAGNOSIS — N130 Hydronephrosis with ureteropelvic junction obstruction: Secondary | ICD-10-CM | POA: Diagnosis not present

## 2021-08-13 DIAGNOSIS — N202 Calculus of kidney with calculus of ureter: Secondary | ICD-10-CM | POA: Diagnosis not present

## 2021-08-16 DIAGNOSIS — L03116 Cellulitis of left lower limb: Secondary | ICD-10-CM | POA: Diagnosis not present

## 2021-08-16 DIAGNOSIS — L03115 Cellulitis of right lower limb: Secondary | ICD-10-CM | POA: Diagnosis not present

## 2021-08-22 DIAGNOSIS — L03115 Cellulitis of right lower limb: Secondary | ICD-10-CM | POA: Diagnosis not present

## 2021-08-22 DIAGNOSIS — L03116 Cellulitis of left lower limb: Secondary | ICD-10-CM | POA: Diagnosis not present

## 2021-08-27 DIAGNOSIS — N130 Hydronephrosis with ureteropelvic junction obstruction: Secondary | ICD-10-CM | POA: Diagnosis not present

## 2021-08-27 DIAGNOSIS — L03119 Cellulitis of unspecified part of limb: Secondary | ICD-10-CM | POA: Diagnosis not present

## 2021-08-27 DIAGNOSIS — N202 Calculus of kidney with calculus of ureter: Secondary | ICD-10-CM | POA: Diagnosis not present

## 2021-09-10 DIAGNOSIS — L539 Erythematous condition, unspecified: Secondary | ICD-10-CM | POA: Diagnosis not present

## 2021-09-21 DIAGNOSIS — L539 Erythematous condition, unspecified: Secondary | ICD-10-CM | POA: Diagnosis not present

## 2021-09-24 ENCOUNTER — Ambulatory Visit (INDEPENDENT_AMBULATORY_CARE_PROVIDER_SITE_OTHER): Payer: Medicare Other | Admitting: Podiatry

## 2021-09-24 ENCOUNTER — Encounter: Payer: Self-pay | Admitting: Podiatry

## 2021-09-24 DIAGNOSIS — M79609 Pain in unspecified limb: Secondary | ICD-10-CM | POA: Diagnosis not present

## 2021-09-24 DIAGNOSIS — B351 Tinea unguium: Secondary | ICD-10-CM

## 2021-09-24 DIAGNOSIS — D473 Essential (hemorrhagic) thrombocythemia: Secondary | ICD-10-CM | POA: Diagnosis not present

## 2021-09-24 NOTE — Progress Notes (Signed)
This patient returns to the office for evaluation and treatment of long thick painful nails .  This patient is unable to trim her own nails since the patient cannot reach her feet.  Patient says the nails are painful walking and wearing his shoes.  She returns for preventive foot care services.  General Appearance  Alert, conversant and in no acute stress.  Vascular  Dorsalis pedis and posterior tibial  pulses are weakly  palpable  bilaterally.  Capillary return is within normal limits  bilaterally. Temperature is within normal limits  bilaterally.  Neurologic  Senn-Weinstein monofilament wire test within normal limits  bilaterally. Muscle power within normal limits bilaterally.  Nails Thick disfigured discolored nails with subungual debris  from hallux to fifth toes bilaterally. No evidence of bacterial infection or drainage bilaterally.  Orthopedic  No limitations of motion  feet .  No crepitus or effusions noted.  No bony pathology or digital deformities noted.  Skin  normotropic skin with no porokeratosis noted bilaterally.  No signs of infections or ulcers noted.     Onychomycosis  Pain in toes right foot  Pain in toes left foot  Debridement  of nails  1-5  B/L with a nail nipper.  Nails were then filed using a dremel tool with no incidents.    RTC  12 weeks    Gardiner Barefoot DPM

## 2021-09-28 DIAGNOSIS — M810 Age-related osteoporosis without current pathological fracture: Secondary | ICD-10-CM | POA: Diagnosis not present

## 2021-09-29 DIAGNOSIS — H01001 Unspecified blepharitis right upper eyelid: Secondary | ICD-10-CM | POA: Diagnosis not present

## 2021-09-29 DIAGNOSIS — H01004 Unspecified blepharitis left upper eyelid: Secondary | ICD-10-CM | POA: Diagnosis not present

## 2021-09-29 DIAGNOSIS — H401133 Primary open-angle glaucoma, bilateral, severe stage: Secondary | ICD-10-CM | POA: Diagnosis not present

## 2021-09-30 DIAGNOSIS — N202 Calculus of kidney with calculus of ureter: Secondary | ICD-10-CM | POA: Diagnosis not present

## 2021-09-30 DIAGNOSIS — N130 Hydronephrosis with ureteropelvic junction obstruction: Secondary | ICD-10-CM | POA: Diagnosis not present

## 2021-10-05 DIAGNOSIS — L539 Erythematous condition, unspecified: Secondary | ICD-10-CM | POA: Diagnosis not present

## 2021-10-05 DIAGNOSIS — Z23 Encounter for immunization: Secondary | ICD-10-CM | POA: Diagnosis not present

## 2021-10-15 ENCOUNTER — Encounter (HOSPITAL_COMMUNITY): Payer: Self-pay

## 2021-10-15 ENCOUNTER — Emergency Department (HOSPITAL_COMMUNITY): Payer: Medicare Other

## 2021-10-15 ENCOUNTER — Emergency Department (HOSPITAL_COMMUNITY)
Admission: EM | Admit: 2021-10-15 | Discharge: 2021-10-15 | Disposition: A | Discharge status: Home / Self Care | Payer: Medicare Other | Attending: Emergency Medicine | Admitting: Emergency Medicine

## 2021-10-15 ENCOUNTER — Other Ambulatory Visit: Payer: Self-pay

## 2021-10-15 DIAGNOSIS — E875 Hyperkalemia: Secondary | ICD-10-CM | POA: Insufficient documentation

## 2021-10-15 DIAGNOSIS — Z7982 Long term (current) use of aspirin: Secondary | ICD-10-CM | POA: Diagnosis not present

## 2021-10-15 DIAGNOSIS — Z20822 Contact with and (suspected) exposure to covid-19: Secondary | ICD-10-CM | POA: Insufficient documentation

## 2021-10-15 DIAGNOSIS — I700 Atherosclerosis of aorta: Secondary | ICD-10-CM | POA: Diagnosis not present

## 2021-10-15 DIAGNOSIS — R0789 Other chest pain: Secondary | ICD-10-CM | POA: Insufficient documentation

## 2021-10-15 DIAGNOSIS — R059 Cough, unspecified: Secondary | ICD-10-CM | POA: Insufficient documentation

## 2021-10-15 DIAGNOSIS — R079 Chest pain, unspecified: Secondary | ICD-10-CM | POA: Diagnosis not present

## 2021-10-15 DIAGNOSIS — D72829 Elevated white blood cell count, unspecified: Secondary | ICD-10-CM | POA: Diagnosis not present

## 2021-10-15 DIAGNOSIS — R0781 Pleurodynia: Secondary | ICD-10-CM | POA: Insufficient documentation

## 2021-10-15 LAB — BASIC METABOLIC PANEL
Anion gap: 5 (ref 5–15)
BUN: 46 mg/dL — ABNORMAL HIGH (ref 8–23)
CO2: 18 mmol/L — ABNORMAL LOW (ref 22–32)
Calcium: 8.6 mg/dL — ABNORMAL LOW (ref 8.9–10.3)
Chloride: 116 mmol/L — ABNORMAL HIGH (ref 98–111)
Creatinine, Ser: 1.25 mg/dL — ABNORMAL HIGH (ref 0.44–1.00)
GFR, Estimated: 42 mL/min — ABNORMAL LOW (ref >60)
Glucose, Bld: 104 mg/dL — ABNORMAL HIGH (ref 70–99)
Potassium: 5.4 mmol/L — ABNORMAL HIGH (ref 3.5–5.1)
Sodium: 139 mmol/L (ref 135–145)

## 2021-10-15 LAB — CK: Total CK: 11 U/L — ABNORMAL LOW (ref 38–234)

## 2021-10-15 LAB — COMPREHENSIVE METABOLIC PANEL
ALT: 12 U/L (ref 0–44)
AST: 13 U/L — ABNORMAL LOW (ref 15–41)
Albumin: 4.8 g/dL (ref 3.5–5.0)
Alkaline Phosphatase: 43 U/L (ref 38–126)
Anion gap: 8 (ref 5–15)
BUN: 51 mg/dL — ABNORMAL HIGH (ref 8–23)
CO2: 18 mmol/L — ABNORMAL LOW (ref 22–32)
Calcium: 9.5 mg/dL (ref 8.9–10.3)
Chloride: 111 mmol/L (ref 98–111)
Creatinine, Ser: 1.37 mg/dL — ABNORMAL HIGH (ref 0.44–1.00)
GFR, Estimated: 38 mL/min — ABNORMAL LOW (ref >60)
Glucose, Bld: 116 mg/dL — ABNORMAL HIGH (ref 70–99)
Potassium: 5.8 mmol/L — ABNORMAL HIGH (ref 3.5–5.1)
Sodium: 137 mmol/L (ref 135–145)
Total Bilirubin: 0.5 mg/dL (ref 0.3–1.2)
Total Protein: 7.7 g/dL (ref 6.5–8.1)

## 2021-10-15 LAB — CBC
HCT: 39.3 % (ref 36.0–46.0)
Hemoglobin: 12.4 g/dL (ref 12.0–15.0)
MCH: 29.7 pg (ref 26.0–34.0)
MCHC: 31.6 g/dL (ref 30.0–36.0)
MCV: 94 fL (ref 80.0–100.0)
Platelets: 319 10*3/uL (ref 150–400)
RBC: 4.18 MIL/uL (ref 3.87–5.11)
RDW: 18.3 % — ABNORMAL HIGH (ref 11.5–15.5)
WBC: 22.3 10*3/uL — ABNORMAL HIGH (ref 4.0–10.5)
nRBC: 0.1 % (ref 0.0–0.2)

## 2021-10-15 LAB — URINALYSIS, ROUTINE W REFLEX MICROSCOPIC
Bilirubin Urine: NEGATIVE
Glucose, UA: NEGATIVE mg/dL
Hgb urine dipstick: NEGATIVE
Ketones, ur: NEGATIVE mg/dL
Leukocytes,Ua: NEGATIVE
Nitrite: NEGATIVE
Protein, ur: 30 mg/dL — AB
Specific Gravity, Urine: 1.015 (ref 1.005–1.030)
pH: 5 (ref 5.0–8.0)

## 2021-10-15 LAB — RESP PANEL BY RT-PCR (FLU A&B, COVID) ARPGX2
Influenza A by PCR: NEGATIVE
Influenza B by PCR: NEGATIVE
SARS Coronavirus 2 by RT PCR: NEGATIVE

## 2021-10-15 LAB — TROPONIN I (HIGH SENSITIVITY)
Troponin I (High Sensitivity): 3 ng/L (ref <18)
Troponin I (High Sensitivity): 3 ng/L (ref <18)

## 2021-10-15 MED ORDER — MORPHINE SULFATE (PF) 2 MG/ML IV SOLN
2.0000 mg | Freq: Once | INTRAVENOUS | Status: DC
  Filled 2021-10-15: qty 1

## 2021-10-15 MED ORDER — LOKELMA 5 G PO PACK: 5.0000 g | PACK | Freq: Once | ORAL | 0 refills | Status: AC

## 2021-10-15 MED ORDER — ONDANSETRON HCL 4 MG/2ML IJ SOLN
4.0000 mg | Freq: Once | INTRAMUSCULAR | Status: AC
  Administered 2021-10-15: 4 mg via INTRAVENOUS
  Filled 2021-10-15: qty 2

## 2021-10-15 MED ORDER — IOHEXOL 350 MG/ML SOLN
75.0000 mL | Freq: Once | INTRAVENOUS | Status: AC | PRN
  Administered 2021-10-15: 50 mL via INTRAVENOUS

## 2021-10-15 MED ORDER — ACETAMINOPHEN 325 MG PO TABS
650.0000 mg | ORAL_TABLET | Freq: Once | ORAL | Status: AC
  Administered 2021-10-15: 650 mg via ORAL
  Filled 2021-10-15: qty 2

## 2021-10-15 MED ORDER — SODIUM CHLORIDE 0.9 % IV BOLUS
1000.0000 mL | Freq: Once | INTRAVENOUS | Status: AC
  Administered 2021-10-15: 1000 mL via INTRAVENOUS

## 2021-10-15 MED ORDER — OXYCODONE-ACETAMINOPHEN 5-325 MG PO TABS: 1.0000 | ORAL_TABLET | Freq: Four times a day (QID) | ORAL | 0 refills | Status: DC | PRN

## 2021-10-15 NOTE — Discharge Instructions (Signed)
You will need to make an appointment with your doctor to get repeat blood work to check your white count and your potassium and kidney function.

## 2021-10-15 NOTE — ED Provider Notes (Signed)
[facility] DEPT Provider Note   CSN: [id number] Arrival date & time: 10/15/21  1059     History  Chief Complaint  Patient presents with   rib cage pain   Cough    Chelsea Garcia is a 88 y.o. female.  Pt is a 88 yo female with a pmhx significant for migraines, glaucoma, osteopenia, arthritis, and gerd.  Pt developed a cough and right sided rib pain last night.  She said the pain is worse with movement.  She has some spasms in the muscles on the right side of her chest.  No rash.  No fevers.  No abd pain.  She has taken tylenol for sx, but that has not helped.       Home Medications Prior to Admission medications   Medication Sig Start Date End Date Taking? Authorizing Provider  oxyCODONE-acetaminophen (PERCOCET/ROXICET) 5-325 MG tablet Take 1 tablet by mouth every 6 (six) hours as needed for severe pain. 10/15/21  Yes Isla Pence, MD  sodium zirconium cyclosilicate (LOKELMA) 5 g packet Take 5 g by mouth once for 1 dose. 10/15/21 10/15/21 Yes Isla Pence, MD  acetaminophen (TYLENOL) 500 MG tablet Take 500 mg by mouth every 6 (six) hours as needed for moderate pain.    [provider]  aspirin EC 81 MG tablet Take 81 mg by mouth daily. Swallow whole.    [provider]  bismuth subsalicylate (PEPTO BISMOL) 262 MG/15ML suspension Take 30 mLs by mouth every 6 (six) hours as needed.    [provider]  brimonidine (ALPHAGAN) 0.2 % ophthalmic solution Place 1 drop into both eyes 2 (two) times daily. 08/17/20   [provider]  calcium carbonate (OS-CAL) 1250 (500 Ca) MG chewable tablet Chew 1 tablet by mouth daily.    [provider]  fluticasone-salmeterol (WIXELA INHUB) 100-50 MCG/ACT AEPB Inhale 1 puff into the lungs 2 (two) times daily. in the morning and at bedtime. 02/05/21   Margaretha Seeds, MD  Latanoprostene Bunod (VYZULTA) 0.024 % SOLN Apply 1 drop to eye every evening. Both eyes    [provider]  loperamide (IMODIUM A-D) 2 MG tablet Take 2 mg by mouth 4 (four) times daily as needed for diarrhea or loose stools.    [provider]  Multiple Vitamin (MULTI VITAMIN) TABS Take 2 tablets by mouth daily.    [provider]  Netarsudil Dimesylate (RHOPRESSA) 0.02 % SOLN Apply 1 drop to eye every evening. Both eyes    [provider]  Polyethyl Glycol-Propyl Glycol (SYSTANE OP) Apply to eye. Uses eye drops prn for dry eye    [provider]      Allergies    Nystatin, Bee pollen, Pollen extract, Albuterol, Doxycycline, Mangifera indica fruit ext (mango) [mangifera indica], Mite (d. farinae), and Sulfamethoxazole    Review of Systems   Review of Systems  Respiratory:  Positive for cough.   Cardiovascular:        Right sided cp  All other systems reviewed and are negative.   Physical Exam Updated Vital Signs BP 107/65   Pulse 91   Temp 98.1 F (36.7 C) (Oral)   Resp 16   Ht 5' (1.524 m)   Wt 49.9 kg   SpO2 96%   BMI 21.48 kg/m  Physical Exam Vitals and nursing note reviewed.  Constitutional:      Appearance: Normal appearance.  HENT:     Head: Normocephalic and atraumatic.     Right  Ear: External ear normal.     Left Ear: External ear normal.     Nose: Nose normal.     Mouth/Throat:     Mouth: Mucous membranes are dry.  Eyes:     Extraocular Movements: Extraocular movements intact.     Conjunctiva/sclera: Conjunctivae normal.     Pupils: Pupils are equal, round, and reactive to light.  Cardiovascular:     Rate and Rhythm: Normal rate and regular rhythm.     Pulses: Normal pulses.     Heart sounds: Normal heart sounds.  Pulmonary:     Effort: Pulmonary effort is normal.     Breath sounds: Normal breath sounds.  Chest:    Abdominal:     General: Abdomen is flat. Bowel sounds are normal.     Palpations: Abdomen is soft.  Musculoskeletal:        General: Normal range of motion.     Cervical back: Normal range of  motion and neck supple.  Skin:    General: Skin is warm.     Capillary Refill: Capillary refill takes less than 2 seconds.  Neurological:     General: No focal deficit present.     Mental Status: She is alert and oriented to person, place, and time.  Psychiatric:        Mood and Affect: Mood normal.        Behavior: Behavior normal.     ED Results / Procedures / Treatments   Labs (all labs ordered are listed, but only abnormal results are displayed) Labs Reviewed  CBC - Abnormal; Notable for the following components:      Result Value   WBC 22.3 (*)    RDW 18.3 (*)    All other components within normal limits  CK - Abnormal; Notable for the following components:   Total CK 11 (*)    All other components within normal limits  COMPREHENSIVE METABOLIC PANEL - Abnormal; Notable for the following components:   Potassium 5.8 (*)    CO2 18 (*)    Glucose, Bld 116 (*)    BUN 51 (*)    Creatinine, Ser 1.37 (*)    AST 13 (*)    GFR, Estimated 38 (*)    All other components within normal limits  URINALYSIS, ROUTINE W REFLEX MICROSCOPIC - Abnormal; Notable for the following components:   Protein, ur 30 (*)    Bacteria, UA RARE (*)    All other components within normal limits  BASIC METABOLIC PANEL - Abnormal; Notable for the following components:   Potassium 5.4 (*)    Chloride 116 (*)    CO2 18 (*)    Glucose, Bld 104 (*)    BUN 46 (*)    Creatinine, Ser 1.25 (*)    Calcium 8.6 (*)    GFR, Estimated 42 (*)    All other components within normal limits  RESP PANEL BY RT-PCR (FLU A&B, COVID) ARPGX2  TROPONIN I (HIGH SENSITIVITY)  TROPONIN I (HIGH SENSITIVITY)    EKG EKG Interpretation  Date/Time:  Friday October 15 2021 16:19:09 EDT Ventricular Rate:  74 PR Interval:  161 QRS Duration: 89 QT Interval:  360 QTC Calculation: 400 R Axis:   -45 Text Interpretation: Sinus rhythm Left anterior fascicular block Left ventricular hypertrophy Anterior Q waves, possibly due to  LVH No old tracing to compare Confirmed by Isla Pence [phone]) on 10/15/2021 8:23:35 PM  Radiology CT Angio Chest PE W and/or Wo Contrast  Result Date:  10/15/2021 CLINICAL DATA:  Cough, right-sided chest pain EXAM: CT ANGIOGRAPHY CHEST WITH CONTRAST TECHNIQUE: Multidetector CT imaging of the chest was performed using the standard protocol during bolus administration of intravenous contrast. Multiplanar CT image reconstructions and MIPs were obtained to evaluate the vascular anatomy. RADIATION DOSE REDUCTION: This exam was performed according to the departmental dose-optimization program which includes automated exposure control, adjustment of the mA and/or kV according to patient size and/or use of iterative reconstruction technique. CONTRAST:  71m OMNIPAQUE IOHEXOL 350 MG/ML SOLN COMPARISON:  10/15/2021, 07/25/2019 FINDINGS: Cardiovascular: This is a technically adequate evaluation of the pulmonary vasculature. Respiratory motion does somewhat limit evaluation at the lung bases. No filling defects or pulmonary emboli. Mild cardiomegaly without pericardial effusion. Normal caliber of the thoracic aorta. Atherosclerosis of the aorta unchanged. Mediastinum/Nodes: Stable appearance of the thyroid, trachea, and esophagus. No pathologic adenopathy. Lungs/Pleura: Chronic bibasilar bronchiectasis most pronounced within the right middle lobe and lingula with associated scarring or atelectasis. No acute airspace disease, effusion, or pneumothorax. Upper Abdomen: The spleen is markedly enlarged, incompletely evaluated on this study. No other acute upper abdominal findings. Musculoskeletal: No acute or destructive bony lesions. Reconstructed images demonstrate no additional findings. Review of the MIP images confirms the above findings. IMPRESSION: 1. No evidence of pulmonary embolus. 2. Bibasilar bronchiectasis and scarring. No acute airspace disease. 3. Marked splenomegaly again noted, incompletely evaluated due to  slice selection. 4.  Aortic Atherosclerosis (ICD10-I70.0). 5. Cardiomegaly. Electronically Signed   By: MRanda NgoM.D.   On: 10/15/2021 18:45   DG Chest Port 1 View  Result Date: 10/15/2021 CLINICAL DATA:  Chest wall pain.  No known trauma EXAM: PORTABLE CHEST 1 VIEW COMPARISON:  12/03/2018 FINDINGS: Stable mild cardiomegaly. Slightly low lung volumes. No focal airspace consolidation, pleural effusion, or pneumothorax. IMPRESSION: No active disease. Electronically Signed   By: NDavina PokeD.O.   On: 10/15/2021 12:37    Procedures Procedures    Medications Ordered in ED Medications  morphine (PF) 2 MG/ML injection 2 mg (2 mg Intravenous Patient Refused/Not Given 10/15/21 1641)  sodium chloride 0.9 % bolus 1,000 mL (0 mLs Intravenous Stopped 10/15/21 1814)  ondansetron (ZOFRAN) injection 4 mg (4 mg Intravenous Given 10/15/21 1641)  iohexol (OMNIPAQUE) 350 MG/ML injection 75 mL (50 mLs Intravenous Contrast Given 10/15/21 1816)  acetaminophen (TYLENOL) tablet 650 mg (650 mg Oral Given 10/15/21 1934)    ED Course/ Medical Decision Making/ A&P                           Medical Decision Making Amount and/or Complexity of Data Reviewed Labs: ordered. Radiology: ordered.  Risk OTC drugs. Prescription drug management.   This patient presents to the ED for concern of cp, cough, sob, this involves an extensive number of treatment options, and is a complaint that carries with it a high risk of complications and morbidity.  The differential diagnosis includes pna, uri, covid/flu, pe   Co morbidities that complicate the patient evaluation  migraines, glaucoma, osteopenia, arthritis, and gerd   Additional history obtained:  Additional history obtained from epic chart review External records from outside source obtained and reviewed including family   Lab Tests:  I Ordered, and personally interpreted labs.  The pertinent results include:  covid/flu neg, cmp with k elevated at 5.8,  bun 51 and cr 1.37, ua neg, trop nl, wbc elevated at 22.3; repeat bmp after fluids show k at 5.4 and cr down to 1.25.  Imaging Studies ordered:  I ordered imaging studies including cxr and ct chest  I independently visualized and interpreted imaging which showed  CXR: IMPRESSION:  No active disease.  CT chest: IMPRESSION:  1. No evidence of pulmonary embolus.  2. Bibasilar bronchiectasis and scarring. No acute airspace disease.  3. Marked splenomegaly again noted, incompletely evaluated due to  slice selection.  4.  Aortic Atherosclerosis (ICD10-I70.0).  5. Cardiomegaly.   I agree with the radiologist interpretation   Cardiac Monitoring:  The patient was maintained on a cardiac monitor.  I personally viewed and interpreted the cardiac monitored which showed an underlying rhythm of: nsr   Medicines ordered and prescription drug management:  I ordered medication including ivfs  for dehydration  Reevaluation of the patient after these medicines showed that the patient improved I have reviewed the patients home medicines and have made adjustments as needed   Test Considered:  ct   Critical Interventions:  ivfs  Problem List / ED Course:  Right chest wall pain:  no evidence of shingles.  No PE or PNA.  Covid/flu neg.  Likely msk as it is hurts more with mvmt.   Leukocytosis:  no fever.  No evidence of infection.  Pt does have a hx of leukocytosis.  She is to get this rechecked by pcp. Hyperkalemia and aki:  improving after ivfs.  I will give pt 1 does of lokelma.  She is to get this rechecked by pcp as well.  Pt feels better.  She is stable for d/c.  Return if worse.  F/u with pcp.   Reevaluation:  After the interventions noted above, I reevaluated the patient and found that they have :improved   Social Determinants of Health:  Lives at home with family   Dispostion:  After consideration of the diagnostic results and the patients response to treatment, I  feel that the patent would benefit from discharge with outpatient f/u.          Final Clinical Impression(s) / ED Diagnoses Final diagnoses:  Chest wall pain  Leukocytosis, unspecified type  Hyperkalemia    Rx / DC Orders ED Discharge Orders          Ordered    oxyCODONE-acetaminophen (PERCOCET/ROXICET) 5-325 MG tablet  Every 6 hours PRN        10/15/21 2022    sodium zirconium cyclosilicate (LOKELMA) 5 g packet   Once        10/15/21 2022              Isla Pence, MD 10/15/21 2026

## 2021-10-15 NOTE — ED Triage Notes (Addendum)
Patient c/o bilateral rib cage pain for approx 12 hours. Patient states that the pain worsens when she moves her arms or ambulates. Patient states she has a non productive cough and congestion.

## 2021-10-15 NOTE — ED Notes (Signed)
Pt took home Brimonidine tartrate ophthalmic solution 0.2%.

## 2021-10-15 NOTE — ED Provider Triage Note (Signed)
Emergency Medicine Provider Triage Evaluation Note  Chelsea Garcia , a 88 y.o. female  was evaluated in triage.  Pt complains of bilateral posterior rib cage pain that started when she woke up this morning.  Pain hurts when she moves, breathes, coughs.  She has had a new cough recently.  No recent Trauma or recent strenuous activity or heavy lifting.  Review of Systems  Positive:  Negative:   Physical Exam  BP (!) 109/56 (BP Location: Left Arm)   Pulse 79   Temp 98.5 F (36.9 C) (Oral)   Resp 15   Ht 5' (1.524 m)   Wt 49.9 kg   SpO2 97%   BMI 21.48 kg/m  Gen:   Awake, no distress   Resp:  Normal effort  MSK:   Moves extremities without difficulty  Other:  Pain in bilateral posterior lateral rib cage  Medical Decision Making  Medically screening exam initiated at 12:05 PM.  Appropriate orders placed.  Sandeep Delagarza was informed that the remainder of the evaluation will be completed by another provider, this initial triage assessment does not replace that evaluation, and the importance of remaining in the ED until their evaluation is complete.     Adolphus Birchwood, PA-C 10/15/21 1206

## 2021-10-19 ENCOUNTER — Telehealth: Payer: Self-pay

## 2021-10-19 NOTE — Telephone Encounter (Signed)
     Patient  visit on 10/6  at [facility] was  Have you been able to follow up with your primary care physician? yes  The patient was or was not able to obtain any needed medicine or equipment. yes  Are there diet recommendations that you are having difficulty following? na  Patient expresses understanding of discharge instructions and education provided has no other needs at this time.  yes    [facility], Care Management  [phone] [address], [place], [place] [address] Phone: [phone] Email: [email or web address]

## 2021-10-22 ENCOUNTER — Encounter: Payer: Self-pay | Admitting: Hematology

## 2021-10-22 ENCOUNTER — Inpatient Hospital Stay: Payer: Medicare Other | Attending: Hematology | Admitting: Hematology

## 2021-10-22 DIAGNOSIS — D473 Essential (hemorrhagic) thrombocythemia: Secondary | ICD-10-CM | POA: Diagnosis not present

## 2021-10-22 DIAGNOSIS — N130 Hydronephrosis with ureteropelvic junction obstruction: Secondary | ICD-10-CM | POA: Diagnosis not present

## 2021-10-22 DIAGNOSIS — J479 Bronchiectasis, uncomplicated: Secondary | ICD-10-CM | POA: Diagnosis not present

## 2021-10-22 NOTE — Progress Notes (Signed)
[facility]   Telephone:([phone]) [phone] Fax:(336) [phone]   Clinic Follow up Note   Patient Care Team: Lujean Amel, MD as PCP - General (Family Medicine) Bernadene Bell, MD (Inactive) as Consulting Physician (Hematology)  Date of Service:  10/22/2021  I connected with Chelsea Garcia on 10/22/21 at  4:20 PM EDT by telephone and verified that I am speaking with the correct person using two identifiers.   I discussed the limitations, risks, security and privacy concerns of performing an evaluation and management service by telephone and the availability of in person appointments. I also discussed with the patient that there may be a patient responsible charge related to this service. The patient expressed understanding and agreed to proceed.   Patient's location:  Home  Provider's location:  Office   CHIEF COMPLAINT: abnormal CBC   CURRENT THERAPY:  Surveillance  ASSESSMENT & PLAN:  Chelsea Garcia is a 88 y.o. female with   1. Essential thrombocytosis, JAK2(+) -Diagnosed in 11/2013 with plt initially in 600k range. Due to her advanced age, she is at high risk for thrombosis secondary to ET. I previously discussed the possibility of her ET evolving to myelofibrosis leukemia, so far her WBC has been normal, if the concerns arises in future, I may recommend repeating bone marrow biopsy to further evaluate.  -She had been taking hydrea since 2015, mostly at low dose and tolerated well. Given the good control of her ET, we stopped her hydrea since 02/05/20. If her platelets increase over 500K, I would recommend restarting hydrea. She is agreeable.  -During her recent ED visit, CBC was checked, which showed WBC 22K, normal hemoglobin and platelet count.  She has chronic mild leukocytosis due to her MPN, BUT WBC was higher than before and differentiation was not done.  I suspect her leukocytosis was related to the stress, no other concerning signs on CBC. -We will repeat CBC in 3  months -Continue monitoring, no need treatment for ET.   PLAN: -continue baby Aspirin alone -We will schedule lab appointment in 3 months -She will see [place] with lab in July 2024 as scheduled.   No problem-specific Assessment & Plan notes found for this encounter.   SUMMARY OF ONCOLOGIC HISTORY: Oncology History  Essential thrombocytosis ([place])  10/01/2013 Initial Diagnosis   Essential thrombocytosis    Prior Therapy: Hydroxyurea 59m daily started 11/27/2013. Decreased to 5018mM-F in 12/2017, further decrease to 50013mWF on 07/06/2018. Reduced to 500m14m 2 days a week starting 12/25/18. Reduced to 500mg46me weekly starting 03/18/19. Held since 02/05/20 due to well controlled ET.    INTERVAL HISTORY:  Chelsea Reiley Bertagnollied my office and requested appointment.  She was contacted for a phone visit, and I verified her identity.  She was seen in the ED on October 15, 2021, for some muscle cramps due to hyperkalemia.  CBC was done during the ED visit, her leukocytosis was worse than before.  She was concerned.  She did see her primary care physician today and had a repeat lab, results still pending.  She is otherwise feels well, no other new complaints.    All other systems were reviewed with the patient and are negative. MEDICAL HISTORY:  Past Medical History:  Diagnosis Date   [facility]han [facility]-2020   Cellulitis of right foot    recurrent cellulitis last 7 days finishes ciprofloxacin for on 11-26-2020   Colon polyps    Benign.  2 colonoscopies in past. Due now again.   Complication of anesthesia    "got dizzy in 70's"   Diarrhea    resolved now taking imodium ad prn   Dry eye 11/25/2020   both eyes   Endometriosis    required TAH when young   Essential tremor    since 40's   GERD (gastroesophageal reflux disease)    denis   Glaucoma    started in 40's both eyes   Low back pain    since 40's   Migraine     started in 50's last migraine 10 or 20 yrs ago per pt on 11-25-2020   Osteopenia    2014   Seasonal allergies    to Dust, mold and pollen.   Thrombocytosis 09/2013   hx of resolved follows up with dr Truitt Merle oncology   Tick bites    she likes out door activities, 2 this year (2015)   UPJ (ureteropelvic junction) obstruction 11/25/2020   left   Urinary frequency    Wears dentures    upper   Wears glasses     SURGICAL HISTORY: Past Surgical History:  Procedure Laterality Date   CATARACT EXTRACTION Left 02/07/2013   right 2015   colonscopy     10 or 15 yrs ago per pt on 11-25-2020   CYSTOSCOPY W/ URETERAL STENT PLACEMENT Left 11/26/2020   Procedure: CYSTOSCOPY WITH BILATERAL RETROGRADE PYELOGRAM/URETERAL STENT PLACEMENT;  Surgeon: Festus Aloe, MD;  Location: [facility];  Service: Urology;  Laterality: Left;  ONLY NEEDS 30 MIN   CYSTOSCOPY WITH URETEROSCOPY AND STENT PLACEMENT Left 01/12/2021   Procedure: CYSTOSCOPY WITH URETEROSCOPY AND STENT EXCHANGE;  Surgeon: Festus Aloe, MD;  Location: [facility];  Service: Urology;  Laterality: Left;   INGUINAL HERNIA REPAIR Left 01/09/2020   Procedure: LEFT INGUINAL HERNIA REPAIR WITH MESH;  Surgeon: Erroll Luna, MD;  Location: [facility];  Service: General;  Laterality: Left;   INSERTION OF MESH Left 01/09/2020   Procedure: INSERTION OF MESH;  Surgeon: Erroll Luna, MD;  Location: [facility];  Service: General;  Laterality: Left;   TOTAL ABDOMINAL HYSTERECTOMY  09/10/1968   TUBAL LIGATION     yrs ago    I have reviewed the social history and family history with the patient and they are unchanged from previous note.  ALLERGIES:  is allergic to nystatin, bee pollen, pollen extract, albuterol, doxycycline, mangifera indica fruit ext (mango) [mangifera indica], mite (d. farinae), and sulfamethoxazole.  MEDICATIONS:  Current Outpatient Medications  Medication Sig  Dispense Refill   acetaminophen (TYLENOL) 500 MG tablet Take 500 mg by mouth every 6 (six) hours as needed for moderate pain.     aspirin EC 81 MG tablet Take 81 mg by mouth daily. Swallow whole.     bismuth subsalicylate (PEPTO BISMOL) 262 MG/15ML suspension Take 30 mLs by mouth every 6 (six) hours as needed.     brimonidine (ALPHAGAN) 0.2 % ophthalmic solution Place 1 drop into both eyes 2 (two) times daily.     calcium carbonate (OS-CAL) 1250 (500 Ca) MG chewable tablet Chew 1 tablet by mouth daily.     fluticasone-salmeterol (WIXELA INHUB) 100-50 MCG/ACT AEPB Inhale 1 puff into the lungs 2 (two) times daily. in the morning and at bedtime. 60 each 11   Latanoprostene Bunod (VYZULTA) 0.024 % SOLN Apply 1 drop to eye every evening. Both eyes     loperamide (IMODIUM A-D) 2 MG tablet Take  2 mg by mouth 4 (four) times daily as needed for diarrhea or loose stools.     Multiple Vitamin (MULTI VITAMIN) TABS Take 2 tablets by mouth daily.     Netarsudil Dimesylate (RHOPRESSA) 0.02 % SOLN Apply 1 drop to eye every evening. Both eyes     oxyCODONE-acetaminophen (PERCOCET/ROXICET) 5-325 MG tablet Take 1 tablet by mouth every 6 (six) hours as needed for severe pain. 15 tablet 0   Polyethyl Glycol-Propyl Glycol (SYSTANE OP) Apply to eye. Uses eye drops prn for dry eye     No current facility-administered medications for this visit.    PHYSICAL EXAMINATION: ECOG PERFORMANCE STATUS: 1 - Symptomatic but completely ambulatory  There were no vitals filed for this visit.  Wt Readings from Last 3 Encounters:  10/15/21 110 lb (49.9 kg)  07/27/21 109 lb 11.2 oz (49.8 kg)  04/08/21 110 lb (49.9 kg)     GENERAL:alert, no distress and comfortable SKIN: skin color, texture, turgor are normal, no rashes or significant lesions EYES: normal, Conjunctiva are pink and non-injected, sclera clear Musculoskeletal:no cyanosis of digits and no clubbing  NEURO: alert & oriented x 3 with fluent speech, no focal  motor/sensory deficits  LABORATORY DATA:  I have reviewed the data as listed    Latest Ref Rng & Units 10/15/2021   12:12 PM 07/27/2021    1:37 PM 04/08/2021    4:00 PM  CBC  WBC 4.0 - 10.5 K/uL 22.3  14.9  16.0   Hemoglobin 12.0 - 15.0 g/dL 12.4  12.1  13.4   Hematocrit 36.0 - 46.0 % 39.3  37.1  43.4   Platelets 150 - 400 K/uL 319  301  344         Latest Ref Rng & Units 10/15/2021    7:47 PM 10/15/2021   12:12 PM 04/08/2021    4:00 PM  CMP  Glucose 70 - 99 mg/dL 104  116  108   BUN 8 - 23 mg/dL 46  51  30   Creatinine 0.44 - 1.00 mg/dL 1.25  1.37  1.02   Sodium 135 - 145 mmol/L 139  137  139   Potassium 3.5 - 5.1 mmol/L 5.4  5.8  4.6   Chloride 98 - 111 mmol/L 116  111  103   CO2 22 - 32 mmol/L _0 Calcium 8.9 - 10.3 mg/dL 8.6  9.5  9.9   Total Protein 6.5 - 8.1 g/dL  7.7  7.8   Total Bilirubin 0.3 - 1.2 mg/dL  0.5  0.7   Alkaline Phos 38 - 126 U/L  43  48   AST 15 - 41 U/L  13  14   ALT 0 - 44 U/L  12  10       RADIOGRAPHIC STUDIES: I have personally reviewed the radiological images as listed and agreed with the findings in the report. No results found.    I discussed the assessment and treatment plan with the patient. The patient was provided an opportunity to ask questions and all were answered. The patient agreed with the plan and demonstrated an understanding of the instructions.   The patient was advised to call back or seek an in-person evaluation if the symptoms worsen or if the condition fails to improve as anticipated.  I provided 11 minutes of non face-to-face telephone visit time during this encounter, and > 50% was spent counseling as documented under my assessment & plan.  Truitt Merle, MD  10/22/2021

## 2021-10-25 ENCOUNTER — Telehealth: Payer: Self-pay | Admitting: Hematology

## 2021-10-25 NOTE — Telephone Encounter (Signed)
Spoke with patient confirming 1/16 appointment

## 2021-11-01 DIAGNOSIS — M40209 Unspecified kyphosis, site unspecified: Secondary | ICD-10-CM | POA: Diagnosis not present

## 2021-11-01 DIAGNOSIS — M419 Scoliosis, unspecified: Secondary | ICD-10-CM | POA: Diagnosis not present

## 2021-11-02 DIAGNOSIS — E038 Other specified hypothyroidism: Secondary | ICD-10-CM | POA: Diagnosis not present

## 2021-11-03 DIAGNOSIS — J479 Bronchiectasis, uncomplicated: Secondary | ICD-10-CM | POA: Diagnosis not present

## 2021-11-03 DIAGNOSIS — E7800 Pure hypercholesterolemia, unspecified: Secondary | ICD-10-CM | POA: Diagnosis not present

## 2021-11-03 DIAGNOSIS — E038 Other specified hypothyroidism: Secondary | ICD-10-CM | POA: Diagnosis not present

## 2021-11-03 DIAGNOSIS — M810 Age-related osteoporosis without current pathological fracture: Secondary | ICD-10-CM | POA: Diagnosis not present

## 2021-11-03 DIAGNOSIS — H409 Unspecified glaucoma: Secondary | ICD-10-CM | POA: Diagnosis not present

## 2021-11-08 DIAGNOSIS — E875 Hyperkalemia: Secondary | ICD-10-CM | POA: Diagnosis not present

## 2021-11-22 DIAGNOSIS — R062 Wheezing: Secondary | ICD-10-CM | POA: Diagnosis not present

## 2021-11-22 DIAGNOSIS — L03115 Cellulitis of right lower limb: Secondary | ICD-10-CM | POA: Diagnosis not present

## 2021-11-22 DIAGNOSIS — I878 Other specified disorders of veins: Secondary | ICD-10-CM | POA: Diagnosis not present

## 2021-11-22 DIAGNOSIS — R600 Localized edema: Secondary | ICD-10-CM | POA: Diagnosis not present

## 2021-11-29 DIAGNOSIS — M546 Pain in thoracic spine: Secondary | ICD-10-CM | POA: Diagnosis not present

## 2021-11-29 DIAGNOSIS — M5459 Other low back pain: Secondary | ICD-10-CM | POA: Diagnosis not present

## 2021-11-29 DIAGNOSIS — M6281 Muscle weakness (generalized): Secondary | ICD-10-CM | POA: Diagnosis not present

## 2021-11-29 DIAGNOSIS — R262 Difficulty in walking, not elsewhere classified: Secondary | ICD-10-CM | POA: Diagnosis not present

## 2021-11-30 DIAGNOSIS — L089 Local infection of the skin and subcutaneous tissue, unspecified: Secondary | ICD-10-CM | POA: Diagnosis not present

## 2021-12-07 DIAGNOSIS — R262 Difficulty in walking, not elsewhere classified: Secondary | ICD-10-CM | POA: Diagnosis not present

## 2021-12-07 DIAGNOSIS — M6281 Muscle weakness (generalized): Secondary | ICD-10-CM | POA: Diagnosis not present

## 2021-12-07 DIAGNOSIS — M546 Pain in thoracic spine: Secondary | ICD-10-CM | POA: Diagnosis not present

## 2021-12-07 DIAGNOSIS — M5459 Other low back pain: Secondary | ICD-10-CM | POA: Diagnosis not present

## 2021-12-20 DIAGNOSIS — E038 Other specified hypothyroidism: Secondary | ICD-10-CM | POA: Diagnosis not present

## 2021-12-20 DIAGNOSIS — M810 Age-related osteoporosis without current pathological fracture: Secondary | ICD-10-CM | POA: Diagnosis not present

## 2021-12-21 DIAGNOSIS — R262 Difficulty in walking, not elsewhere classified: Secondary | ICD-10-CM | POA: Diagnosis not present

## 2021-12-21 DIAGNOSIS — M546 Pain in thoracic spine: Secondary | ICD-10-CM | POA: Diagnosis not present

## 2021-12-21 DIAGNOSIS — M6281 Muscle weakness (generalized): Secondary | ICD-10-CM | POA: Diagnosis not present

## 2021-12-21 DIAGNOSIS — M5459 Other low back pain: Secondary | ICD-10-CM | POA: Diagnosis not present

## 2021-12-27 DIAGNOSIS — M5459 Other low back pain: Secondary | ICD-10-CM | POA: Diagnosis not present

## 2021-12-27 DIAGNOSIS — R262 Difficulty in walking, not elsewhere classified: Secondary | ICD-10-CM | POA: Diagnosis not present

## 2021-12-27 DIAGNOSIS — M6281 Muscle weakness (generalized): Secondary | ICD-10-CM | POA: Diagnosis not present

## 2021-12-27 DIAGNOSIS — M546 Pain in thoracic spine: Secondary | ICD-10-CM | POA: Diagnosis not present

## 2021-12-28 ENCOUNTER — Other Ambulatory Visit: Payer: Self-pay | Admitting: *Deleted

## 2021-12-28 ENCOUNTER — Ambulatory Visit (INDEPENDENT_AMBULATORY_CARE_PROVIDER_SITE_OTHER): Payer: Medicare Other | Admitting: Podiatry

## 2021-12-28 ENCOUNTER — Encounter: Payer: Self-pay | Admitting: Podiatry

## 2021-12-28 DIAGNOSIS — B351 Tinea unguium: Secondary | ICD-10-CM | POA: Diagnosis not present

## 2021-12-28 DIAGNOSIS — D473 Essential (hemorrhagic) thrombocythemia: Secondary | ICD-10-CM | POA: Diagnosis not present

## 2021-12-28 DIAGNOSIS — M79609 Pain in unspecified limb: Secondary | ICD-10-CM

## 2021-12-28 DIAGNOSIS — I878 Other specified disorders of veins: Secondary | ICD-10-CM

## 2021-12-28 NOTE — Progress Notes (Signed)
This patient returns to the office for evaluation and treatment of long thick painful nails .  This patient is unable to trim her own nails since the patient cannot reach her feet.  Patient says the nails are painful walking and wearing her shoes.  She returns for preventive foot care services.  General Appearance  Alert, conversant and in no acute stress.  Vascular  Dorsalis pedis and posterior tibial  pulses are weakly  palpable  bilaterally.  Capillary return is within normal limits  bilaterally. Temperature is within normal limits  bilaterally.  Neurologic  Senn-Weinstein monofilament wire test within normal limits  bilaterally. Muscle power within normal limits bilaterally.  Nails Thick disfigured discolored nails with subungual debris  from hallux to fifth toes bilaterally. No evidence of bacterial infection or drainage bilaterally.  Orthopedic  No limitations of motion  feet .  No crepitus or effusions noted.  No bony pathology or digital deformities noted.  Skin  normotropic skin with no porokeratosis noted bilaterally.  No signs of infections or ulcers noted.     Onychomycosis  Pain in toes right foot  Pain in toes left foot  Debridement  of nails  1-5  B/L with a nail nipper.  Nails were then filed using a dremel tool with no incidents.    RTC  12 weeks    Gardiner Barefoot DPM

## 2021-12-30 DIAGNOSIS — M546 Pain in thoracic spine: Secondary | ICD-10-CM | POA: Diagnosis not present

## 2021-12-30 DIAGNOSIS — M5459 Other low back pain: Secondary | ICD-10-CM | POA: Diagnosis not present

## 2021-12-30 DIAGNOSIS — R262 Difficulty in walking, not elsewhere classified: Secondary | ICD-10-CM | POA: Diagnosis not present

## 2021-12-30 DIAGNOSIS — M6281 Muscle weakness (generalized): Secondary | ICD-10-CM | POA: Diagnosis not present

## 2022-01-06 DIAGNOSIS — M546 Pain in thoracic spine: Secondary | ICD-10-CM | POA: Diagnosis not present

## 2022-01-06 DIAGNOSIS — M6281 Muscle weakness (generalized): Secondary | ICD-10-CM | POA: Diagnosis not present

## 2022-01-06 DIAGNOSIS — R262 Difficulty in walking, not elsewhere classified: Secondary | ICD-10-CM | POA: Diagnosis not present

## 2022-01-06 DIAGNOSIS — M5459 Other low back pain: Secondary | ICD-10-CM | POA: Diagnosis not present

## 2022-01-09 ENCOUNTER — Telehealth: Payer: Self-pay | Admitting: Pulmonary Disease

## 2022-01-09 NOTE — Telephone Encounter (Signed)
PCCM:  Patient called to let us know that she has covid and was started on [place], DO [place] 01/09/2022 12:06 PM

## 2022-01-10 NOTE — Progress Notes (Deleted)
[facility] [place]  ASSESSMENT / PLAN: Chelsea Garcia is a 89 y.o. female with chronic venous insufficiency of *** lower extremity causing *** (C*** disease).  Venous duplex is significant for *** Recommend compression and elevation for symptomatic relief. ***Follow up with me in three months to discuss saphenous vein ablation. ***Follow up with me as needed.   CHIEF COMPLAINT: ***  HISTORY OF PRESENT ILLNESS: Chelsea Garcia is a 89 y.o. female ***  VASCULAR SURGICAL HISTORY: ***  VASCULAR RISK FACTORS: {FINDINGS; POSITIVE NEGATIVE:[id number]} history of stroke / transient ischemic attack. {FINDINGS; POSITIVE NEGATIVE:[id number]} history of coronary artery disease. *** history of PCI. *** history of CABG.  {FINDINGS; POSITIVE NEGATIVE:[id number]} history of diabetes mellitus. Last A1c ***. {FINDINGS; POSITIVE NEGATIVE:[id number]} history of smoking. *** actively smoking. {FINDINGS; POSITIVE NEGATIVE:[id number]} history of hypertension. *** drug regimen with *** control. {FINDINGS; POSITIVE NEGATIVE:[id number]} history of chronic kidney disease.  Last GFR ***. CKD {stage:30421363}. {FINDINGS; POSITIVE NEGATIVE:[phone]} history of chronic obstructive pulmonary disease, treated with ***.  FUNCTIONAL STATUS: ECOG performance status: {findings; ecog performance status:31780} Ambulatory status: {TNHAmbulation:25868}  CAREY 1 AND 3 YEAR INDEX Female (2pts) 75-79 or 80-84 (2pts) >84 (3pts) Dependence in toileting (1pt) Partial or full dependence in dressing (1pt) History of malignant neoplasm (2pts) CHF (3pts) COPD (1pts) CKD (3pts)  0-3 pts 6% 1 year mortality ; 21% 3 year mortality 4-5 pts 12% 1 year mortality ; 36% 3 year mortality >5 pts 21% 1 year mortality; 54% 3 year mortality   Past Medical History:  Diagnosis Date   Bronchiectasis ([place])    followed by dr Rayann Heman completed pulmonary rehan 06-02-2020   Cellulitis of right foot    recurrent  cellulitis last 7 days finishes ciprofloxacin for on 11-26-2020   Colon polyps    Benign. 2 colonoscopies in past. Due now again.   Complication of anesthesia    "got dizzy in 70's"   Diarrhea    resolved now taking imodium ad prn   Dry eye 11/25/2020   both eyes   Endometriosis    required TAH when young   Essential tremor    since 40's   GERD (gastroesophageal reflux disease)    denis   Glaucoma    started in 40's both eyes   Low back pain    since 40's   Migraine    started in 50's last migraine 10 or 20 yrs ago per pt on 11-25-2020   Osteopenia    2014   Seasonal allergies    to Dust, mold and pollen.   Thrombocytosis 09/2013   hx of resolved follows up with dr Truitt Merle oncology   Tick bites    she likes out door activities, 2 this year (2015)   UPJ (ureteropelvic junction) obstruction 11/25/2020   left   Urinary frequency    Wears dentures    upper   Wears glasses     Past Surgical History:  Procedure Laterality Date   CATARACT EXTRACTION Left 02/07/2013   right 2015   colonscopy     10 or 15 yrs ago per pt on 11-25-2020   CYSTOSCOPY W/ URETERAL STENT PLACEMENT Left 11/26/2020   Procedure: CYSTOSCOPY WITH BILATERAL RETROGRADE PYELOGRAM/URETERAL STENT PLACEMENT;  Surgeon: Festus Aloe, MD;  Location: [facility];  Service: Urology;  Laterality: Left;  ONLY NEEDS 30 MIN   CYSTOSCOPY WITH URETEROSCOPY AND STENT PLACEMENT Left 01/12/2021   Procedure: CYSTOSCOPY WITH URETEROSCOPY AND STENT EXCHANGE;  Surgeon: Festus Aloe, MD;  Location: [facility]  [place];  Service: Urology;  Laterality: Left;   INGUINAL HERNIA REPAIR Left 01/09/2020   Procedure: LEFT INGUINAL HERNIA REPAIR WITH MESH;  Surgeon: Erroll Luna, MD;  Location: [place];  Service: General;  Laterality: Left;   INSERTION OF MESH Left 01/09/2020   Procedure: INSERTION OF MESH;  Surgeon: Erroll Luna, MD;  Location: [place];  Service:  General;  Laterality: Left;   TOTAL ABDOMINAL HYSTERECTOMY  09/10/1968   TUBAL LIGATION     yrs ago    Family History  Problem Relation Age of Onset   Thyroid disease Mother    Cancer Father    Cancer Maternal Aunt 35    Social History   Socioeconomic History   Marital status: Married    Spouse name: Not on file   Number of children: Not on file   Years of education: Not on file   Highest education level: Not on file  Occupational History   Not on file  Tobacco Use   Smoking status: Former    Packs/day: 1.00    Years: 8.00    Total pack years: 8.00    Types: Cigarettes    Quit date: 07/01/1961    Years since quitting: 60.5   Smokeless tobacco: Never  Vaping Use   Vaping Use: Never used  Substance and Sexual Activity   Alcohol use: Not Currently   Drug use: No   Sexual activity: Yes  Other Topics Concern   Not on file  Social History Narrative   Patient lives in [place]. Married. Came by herself. She has got 2 sons ages 42 and 32 and 2 grankids boy 65 and daughter 99. She is active, walks every day, semi-retired accountant.    Social Determinants of Health   Financial Resource Strain: Not on file  Food Insecurity: Not on file  Transportation Needs: Not on file  Physical Activity: Not on file  Stress: Not on file  Social Connections: Not on file  Intimate Partner Violence: Not on file    Allergies  Allergen Reactions   Nystatin     Swelling of lips   Bee Pollen Other (See Comments) and Cough    Mill dust, pollen. Mill dust, pollen.   Pollen Extract Cough    Mill dust, pollen.   Albuterol     Increased HR, had to go to the ER   Doxycycline Nausea And Vomiting   Mangifera Indica Fruit Ext (Mango) [Mangifera Indica] Diarrhea   Mite (D. Farinae) Other (See Comments)    General allergy   Sulfamethoxazole Other (See Comments)    Irritation in eyes  Other Other    Current Outpatient Medications  Medication Sig Dispense Refill   acetaminophen  (TYLENOL) 500 MG tablet Take 500 mg by mouth every 6 (six) hours as needed for moderate pain.     aspirin EC 81 MG tablet Take 81 mg by mouth daily. Swallow whole.     bismuth subsalicylate (PEPTO BISMOL) 262 MG/15ML suspension Take 30 mLs by mouth every 6 (six) hours as needed.     brimonidine (ALPHAGAN) 0.2 % ophthalmic solution Place 1 drop into both eyes 2 (two) times daily.     calcium carbonate (OS-CAL) 1250 (500 Ca) MG chewable tablet Chew 1 tablet by mouth daily.     fluticasone-salmeterol (WIXELA INHUB) 100-50 MCG/ACT AEPB Inhale 1 puff into the lungs 2 (two) times daily. in the morning and at bedtime. 60 each 11   Latanoprostene Bunod (VYZULTA) 0.024 %  SOLN Apply 1 drop to eye every evening. Both eyes     loperamide (IMODIUM A-D) 2 MG tablet Take 2 mg by mouth 4 (four) times daily as needed for diarrhea or loose stools.     Multiple Vitamin (MULTI VITAMIN) TABS Take 2 tablets by mouth daily.     Netarsudil Dimesylate (RHOPRESSA) 0.02 % SOLN Apply 1 drop to eye every evening. Both eyes     oxyCODONE-acetaminophen (PERCOCET/ROXICET) 5-325 MG tablet Take 1 tablet by mouth every 6 (six) hours as needed for severe pain. 15 tablet 0   Polyethyl Glycol-Propyl Glycol (SYSTANE OP) Apply to eye. Uses eye drops prn for dry eye     No current facility-administered medications for this visit.    PHYSICAL EXAM There were no vitals filed for this visit.  Constitutional: *** appearing. *** distress. Appears *** nourished.  Neurologic: CN ***. *** focal findings. *** sensory loss. Psychiatric: *** Mood and affect symmetric and appropriate. Eyes: *** No icterus. No conjunctival pallor. Ears, nose, throat: *** mucous membranes moist. Midline trachea.  Cardiac: *** rate and rhythm.  Respiratory: *** unlabored. Abdominal: *** soft, non-tender, non-distended.  Peripheral vascular: *** Extremity: *** edema. *** cyanosis. *** pallor.  Skin: *** gangrene. *** ulceration.  Lymphatic: *** Stemmer's  sign. *** palpable lymphadenopathy.    PERTINENT LABORATORY AND RADIOLOGIC DATA  Most recent CBC    Latest Ref Rng & Units 10/15/2021   12:12 PM 07/27/2021    1:37 PM 04/08/2021    4:00 PM  CBC  WBC 4.0 - 10.5 K/uL 22.3  14.9  16.0   Hemoglobin 12.0 - 15.0 g/dL 12.4  12.1  13.4   Hematocrit 36.0 - 46.0 % 39.3  37.1  43.4   Platelets 150 - 400 K/uL 319  301  344      Most recent CMP    Latest Ref Rng & Units 10/15/2021    7:47 PM 10/15/2021   12:12 PM 04/08/2021    4:00 PM  CMP  Glucose 70 - 99 mg/dL 104  116  108   BUN 8 - 23 mg/dL 46  51  30   Creatinine 0.44 - 1.00 mg/dL 1.25  1.37  1.02   Sodium 135 - 145 mmol/L 139  137  139   Potassium 3.5 - 5.1 mmol/L 5.4  5.8  4.6   Chloride 98 - 111 mmol/L 116  111  103   CO2 22 - 32 mmol/L '18  18  25   '$ Calcium 8.9 - 10.3 mg/dL 8.6  9.5  9.9   Total Protein 6.5 - 8.1 g/dL  7.7  7.8   Total Bilirubin 0.3 - 1.2 mg/dL  0.5  0.7   Alkaline Phos 38 - 126 U/L  43  48   AST 15 - 41 U/L  13  14   ALT 0 - 44 U/L  12  10     Renal function CrCl cannot be calculated (Patient's most recent lab result is older than the maximum 21 days allowed.).  No results found for: "HGBA1C"  No results found for: "[place]", "LDLC", "HIRISKLDL", "POCLDL", "LDLDIRECT", "REALLDLC", "TOTLDLC"   Vascular Imaging: ***  Tiyon Sanor N. Stanford Breed, MD [facility] Phone Number: [phone] 01/10/2022 12:23 PM   Total time spent on preparing this encounter including chart review, data review, collecting history, examining the patient, coordinating care for this {tnhtimebilling:26202}  Portions of this report may have been transcribed using voice recognition software.  Every  effort has been made to ensure accuracy; however, inadvertent computerized transcription errors may still be present.

## 2022-01-11 ENCOUNTER — Ambulatory Visit (HOSPITAL_COMMUNITY): Admission: RE | Admit: 2022-01-11 | Payer: Medicare Other | Source: Ambulatory Visit

## 2022-01-11 ENCOUNTER — Encounter: Payer: Medicare Other | Admitting: Vascular Surgery

## 2022-01-12 NOTE — Telephone Encounter (Signed)
PT due for 1 year followup. ATC but voicemail required a "remote access code". Mychart message was sent as well.

## 2022-01-12 NOTE — Telephone Encounter (Signed)
-----   Message from [place], MD sent at 01/12/2022  1:07 PM EST ----- Regarding: routine follow-up Please schedule follow-up end of January or anytime in Feb.

## 2022-01-14 NOTE — Telephone Encounter (Signed)
Patient is scheduled with Dr. Loanne Drilling on 02/03/2022 at 10:45am. Patient is aware of [facility] location and phone number.

## 2022-01-24 ENCOUNTER — Telehealth: Payer: Self-pay | Admitting: Pulmonary Disease

## 2022-01-24 ENCOUNTER — Telehealth: Payer: Self-pay | Admitting: *Deleted

## 2022-01-24 NOTE — Patient Outreach (Signed)
  Care Coordination   01/24/2022 Name: Chelsea Garcia MRN: [id number] DOB: 07/16/1933   Care Coordination Outreach Attempts:  An unsuccessful telephone outreach was attempted today to offer the patient information about available care coordination services as a benefit of their health plan.   Follow Up Plan:  Additional outreach attempts will be made to offer the patient care coordination information and services.   Encounter Outcome:  No Answer   Care Coordination Interventions:  No, not indicated    Raina Mina, RN Care Management Coordinator [place] Office [phone]

## 2022-01-24 NOTE — Telephone Encounter (Signed)
ATC X1 LVM for patient to call the office back 

## 2022-01-25 ENCOUNTER — Inpatient Hospital Stay: Payer: Medicare Other | Attending: Hematology

## 2022-01-25 ENCOUNTER — Other Ambulatory Visit: Payer: Self-pay

## 2022-01-25 DIAGNOSIS — Z79899 Other long term (current) drug therapy: Secondary | ICD-10-CM | POA: Diagnosis not present

## 2022-01-25 DIAGNOSIS — Z7982 Long term (current) use of aspirin: Secondary | ICD-10-CM | POA: Insufficient documentation

## 2022-01-25 DIAGNOSIS — D473 Essential (hemorrhagic) thrombocythemia: Secondary | ICD-10-CM | POA: Diagnosis not present

## 2022-01-25 LAB — CBC WITH DIFFERENTIAL (CANCER CENTER ONLY)
Abs Immature Granulocytes: 1.83 10*3/uL — ABNORMAL HIGH (ref 0.00–0.07)
Basophils Absolute: 0.1 10*3/uL (ref 0.0–0.1)
Basophils Relative: 1 %
Eosinophils Absolute: 0.1 10*3/uL (ref 0.0–0.5)
Eosinophils Relative: 1 %
HCT: 30.7 % — ABNORMAL LOW (ref 36.0–46.0)
Hemoglobin: 10 g/dL — ABNORMAL LOW (ref 12.0–15.0)
Immature Granulocytes: 11 %
Lymphocytes Relative: 5 %
Lymphs Abs: 0.8 10*3/uL (ref 0.7–4.0)
MCH: 32.3 pg (ref 26.0–34.0)
MCHC: 32.6 g/dL (ref 30.0–36.0)
MCV: 99 fL (ref 80.0–100.0)
Monocytes Absolute: 1.4 10*3/uL — ABNORMAL HIGH (ref 0.1–1.0)
Monocytes Relative: 8 %
Neutro Abs: 13.2 10*3/uL — ABNORMAL HIGH (ref 1.7–7.7)
Neutrophils Relative %: 74 %
Platelet Count: 251 10*3/uL (ref 150–400)
RBC: 3.1 MIL/uL — ABNORMAL LOW (ref 3.87–5.11)
RDW: 16.5 % — ABNORMAL HIGH (ref 11.5–15.5)
WBC Count: 17.4 10*3/uL — ABNORMAL HIGH (ref 4.0–10.5)
nRBC: 0.1 % (ref 0.0–0.2)

## 2022-01-25 NOTE — Telephone Encounter (Signed)
I called and spoke with the pt  She is c/o increased cough for the past several wks- prod with clear sputum  Pt not seen in over a year  I have scheduled her to see Dr Loanne Drilling tomorrow at 10:30  I gave [place] office location info  Nothing further needed

## 2022-01-26 ENCOUNTER — Other Ambulatory Visit (HOSPITAL_BASED_OUTPATIENT_CLINIC_OR_DEPARTMENT_OTHER): Payer: Self-pay

## 2022-01-26 ENCOUNTER — Encounter (HOSPITAL_BASED_OUTPATIENT_CLINIC_OR_DEPARTMENT_OTHER): Payer: Self-pay | Admitting: Pulmonary Disease

## 2022-01-26 ENCOUNTER — Telehealth: Payer: Self-pay

## 2022-01-26 ENCOUNTER — Other Ambulatory Visit: Payer: Self-pay

## 2022-01-26 ENCOUNTER — Ambulatory Visit (INDEPENDENT_AMBULATORY_CARE_PROVIDER_SITE_OTHER): Payer: Medicare Other

## 2022-01-26 ENCOUNTER — Ambulatory Visit (INDEPENDENT_AMBULATORY_CARE_PROVIDER_SITE_OTHER): Payer: Medicare Other | Admitting: Pulmonary Disease

## 2022-01-26 VITALS — BP 100/60 | HR 63 | Ht 60.0 in | Wt 105.8 lb

## 2022-01-26 DIAGNOSIS — R053 Chronic cough: Secondary | ICD-10-CM

## 2022-01-26 DIAGNOSIS — J42 Unspecified chronic bronchitis: Secondary | ICD-10-CM | POA: Diagnosis not present

## 2022-01-26 DIAGNOSIS — D473 Essential (hemorrhagic) thrombocythemia: Secondary | ICD-10-CM

## 2022-01-26 DIAGNOSIS — J479 Bronchiectasis, uncomplicated: Secondary | ICD-10-CM

## 2022-01-26 MED ORDER — AZITHROMYCIN 250 MG PO TABS: ORAL_TABLET | ORAL | 0 refills | Status: DC

## 2022-01-26 MED ORDER — AZITHROMYCIN 250 MG PO TABS
ORAL_TABLET | ORAL | 0 refills | Status: DC
  Filled 2022-01-26 (×2): qty 6, 5d supply, fill #0

## 2022-01-26 NOTE — Telephone Encounter (Addendum)
Called patient and relayed message below. Patient voiced full understanding.     ----- Message from Truitt Merle, MD sent at 01/25/2022  1:57 PM EST ----- She has developed new anemia, please call her to see if she has any signs of bleeding. Please let her taking prenatal MVI once daily, and schedule lab and see me in 3-4 weeks, please order ferrtin, iron+TIBC, ret panel, B12 and folate level and CMP for next visit.  Thanks   Truitt Merle

## 2022-01-26 NOTE — Patient Instructions (Addendum)
Bronchiectasis without complication Post-Covid infection  --START Wixela 100-50 mcg. Take ONE puff TWICE a day --OK to start taking inhaler as needed during the summer time. If your symptoms (cough, shortness of breath, wheezing), you may need to restart regularly --START claritin or zyrtec daily as directed --Reviewed CXR. LLL infiltrate. Will start azithromycin x 5 days  Follow-up with me in 3 months

## 2022-01-26 NOTE — Progress Notes (Signed)
Subjective:   PATIENT ID: Chelsea Garcia GENDER: female DOB: 09-30-1933, MRN: [id number]   HPI  Chief Complaint  Patient presents with   Follow-up    Cough since christmas, nothing coming up    Reason for Visit: Follow-up bronchiectasis, acute visit  [name] is an 89 year old female former smoker who presents for follow-up for bronchiectasis.  Synopsis: Referred to Pulmonary clinic for CT demonstrating bronchiectasis and tree-in-bud opacities. She has long history of cough, wheezing and chest congestion that has worsened in the summer of 2021.   02/05/21 Since our last visit, she has been compliant with her Advair and reports symptoms are well-controlled. She is able to take walks but limited hip or joint pain. Denies shortness of breath with regular activity. No coughing or wheezing. Denies congestion. Denies respiratory infections in the last year. Has not needed flutter valve in a long time.  01/26/22 Since our last visit she reports having COVID after Christmas. She has unchanged productive cough with thin sputum since then. Denies wheezing. Has nasal congestion that worsens it. Denies fevers, chills. Associated with fatigue especially with exertion. Compliant with Advair nightly. Not taking twice a day since the summer.  Social History: Former smoker. Quit in 1963.   Past Medical History:  Diagnosis Date   Bronchiectasis [facility])    followed by dr Rayann Heman completed pulmonary rehan 06-02-2020   Cellulitis of right foot    recurrent cellulitis last 7 days finishes ciprofloxacin for on 11-26-2020   Colon polyps    Benign. 2 colonoscopies in past. Due now again.   Complication of anesthesia    "got dizzy in 70's"   Diarrhea    resolved now taking imodium ad prn   Dry eye 11/25/2020   both eyes   Endometriosis    required TAH when young   Essential tremor    since 40's   GERD (gastroesophageal reflux disease)    denis   Glaucoma    started in 40's both eyes    Low back pain    since 40's   Migraine    started in 50's last migraine 10 or 20 yrs ago per pt on 11-25-2020   Osteopenia    2014   Seasonal allergies    to Dust, mold and pollen.   Thrombocytosis 09/2013   hx of resolved follows up with dr Truitt Merle oncology   Tick bites    she likes out door activities, 2 this year (2015)   UPJ (ureteropelvic junction) obstruction 11/25/2020   left   Urinary frequency    Wears dentures    upper   Wears glasses      Allergies  Allergen Reactions   Nystatin     Swelling of lips   Bee Pollen Other (See Comments) and Cough    Mill dust, pollen. Mill dust, pollen.   Pollen Extract Cough    Mill dust, pollen.   Albuterol     Increased HR, had to go to the ER   Doxycycline Nausea And Vomiting   Mangifera Indica Fruit Ext (Mango) [Mangifera Indica] Diarrhea   Mite (D. Farinae) Other (See Comments)    General allergy   Sulfamethoxazole Other (See Comments)    Irritation in eyes  Other Other     Outpatient Medications Prior to Visit  Medication Sig Dispense Refill   acetaminophen (TYLENOL) 500 MG tablet Take 500 mg by mouth every 6 (six) hours as needed for moderate pain.  aspirin EC 81 MG tablet Take 81 mg by mouth daily. Swallow whole.     bismuth subsalicylate (PEPTO BISMOL) 262 MG/15ML suspension Take 30 mLs by mouth every 6 (six) hours as needed.     brimonidine (ALPHAGAN) 0.2 % ophthalmic solution Place 1 drop into both eyes 2 (two) times daily.     calcium carbonate (OS-CAL) 1250 (500 Ca) MG chewable tablet Chew 1 tablet by mouth daily.     fluticasone-salmeterol (WIXELA INHUB) 100-50 MCG/ACT AEPB Inhale 1 puff into the lungs 2 (two) times daily. in the morning and at bedtime. 60 each 11   Latanoprostene Bunod (VYZULTA) 0.024 % SOLN Apply 1 drop to eye every evening. Both eyes     loperamide (IMODIUM A-D) 2 MG tablet Take 2 mg by mouth 4 (four) times daily as needed for diarrhea or loose stools.     Multiple Vitamin (MULTI  VITAMIN) TABS Take 2 tablets by mouth daily.     Netarsudil Dimesylate (RHOPRESSA) 0.02 % SOLN Apply 1 drop to eye every evening. Both eyes     Polyethyl Glycol-Propyl Glycol (SYSTANE OP) Apply to eye. Uses eye drops prn for dry eye     oxyCODONE-acetaminophen (PERCOCET/ROXICET) 5-325 MG tablet Take 1 tablet by mouth every 6 (six) hours as needed for severe pain. (Patient not taking: Reported on 01/26/2022) 15 tablet 0   No facility-administered medications prior to visit.    Review of Systems  Constitutional:  Negative for chills, diaphoresis, fever, malaise/fatigue and weight loss.  HENT:  Negative for congestion.   Respiratory:  Positive for cough and sputum production. Negative for hemoptysis, shortness of breath and wheezing.   Cardiovascular:  Negative for chest pain, palpitations and leg swelling.     Objective:   Vitals:   01/26/22 1029  BP: 100/60  Pulse: 63  SpO2: 96%  Weight: 105 lb 12.8 oz (48 kg)  Height: 5' (1.524 m)   SpO2: 96 % O2 Device: None (Room air)  Physical Exam: General: Elderly and frail-appearing, no acute distress HENT: [place], AT Eyes: EOMI, no scleral icterus Respiratory: Coarse right breath sounds, no wheezing Cardiovascular: RRR, -M/R/G, no JVD Extremities:-Edema,-tenderness Neuro: AAO x4, CNII-XII grossly intact Psych: Normal mood, normal affect   Data Reviewed:  Imaging: CT 07/25/19 has bronchiectasis involving in the right middle and lingula associated with volume loss, scarring and few tree-in-bud opacities CTA 10/15/21  - No PE. Bibasilar bronchiectasis and scarring. Splenomegaly CXR 01/26/22 - LLL infiltrate. Bibasilar bronchiectasis and scarring.  PFT: None on file  Labs: CBC    Component Value Date/Time   WBC 17.4 (H) 01/25/2022 1249   WBC 22.3 (H) 10/15/2021 1212   RBC 3.10 (L) 01/25/2022 1249   HGB 10.0 (L) 01/25/2022 1249   HGB 12.4 12/16/2016 0908   HCT 30.7 (L) 01/25/2022 1249   HCT 38.6 12/16/2016 0908   PLT 251  01/25/2022 1249   PLT 271 12/16/2016 0908   MCV 99.0 01/25/2022 1249   MCV 101.8 (H) 12/16/2016 0908   MCH 32.3 01/25/2022 1249   MCHC 32.6 01/25/2022 1249   RDW 16.5 (H) 01/25/2022 1249   RDW 15.9 (H) 12/16/2016 0908   LYMPHSABS 0.8 01/25/2022 1249   LYMPHSABS 0.6 (L) 12/16/2016 0908   MONOABS 1.4 (H) 01/25/2022 1249   MONOABS 0.3 12/16/2016 0908   EOSABS 0.1 01/25/2022 1249   EOSABS 0.1 12/16/2016 0908   BASOSABS 0.1 01/25/2022 1249   BASOSABS 0.0 12/16/2016 0908   Absolute eos 12/07/20 - 100  Assessment & Plan:  Discussion: 89 year old female who presents for follow-up. Bronchiectasis likely related to prior/recurrent infections. With recent COVID infection will increase ICS/LABA from daily to twice daily. De-escalate when seasons change.  Bronchiectasis without complication Post-Covid infection  --START Wixela 100-50 mcg. Take ONE puff TWICE a day --OK to start taking inhaler as needed during the summer time. If your symptoms (cough, shortness of breath, wheezing), you may need to restart regularly --START claritin or zyrtec daily as directed --Reviewed CXR. LLL infiltrate. Will start azithromycin x 5 days    Health Maintenance Immunization History  Administered Date(s) Administered   Fluad Quad(high Dose 65+) 10/09/2019   Influenza Split 11/22/2007, 11/10/2008, 11/06/2009, 10/14/2020   Influenza,inj,Quad PF,6+ Mos 10/30/2015, 11/03/2016, 09/23/2019   Influenza,inj,quad, With Preservative 09/12/2013, 10/09/2014   Influenza-Unspecified 10/11/2013   PFIZER Comirnaty(Gray Top)Covid-19 Tri-Sucrose Vaccine 06/05/2020   PFIZER(Purple Top)SARS-COV-2 Vaccination 01/15/2019, 02/20/2019, 03/05/2019, 03/20/2019, 10/16/2019, 06/05/2020   Pfizer Covid-19 Vaccine Bivalent Booster 32yr & up 10/14/2020   Pneumococcal Conjugate-13 10/09/2014   Pneumococcal Polysaccharide-23 01/16/2003   Td 07/19/1999   Tdap 08/08/2011, 05/27/2019   Zoster, Live 12/27/2006   CT Lung Screen - not  qualified  Orders Placed This Encounter  Procedures   DG Chest 2 View    Standing Status:   Future    Number of Occurrences:   1    Standing Expiration Date:   01/27/2023    Order Specific Question:   Reason for Exam (SYMPTOM  OR DIAGNOSIS REQUIRED)    Answer:   cough    Order Specific Question:   Preferred imaging location?    Answer:   Internal   Meds ordered this encounter  Medications   DISCONTD: azithromycin (ZITHROMAX) 250 MG tablet    Sig: Take two tablets on day 1, then one tablet daily on day 2-5.    Dispense:  6 tablet    Refill:  0   azithromycin (ZITHROMAX) 250 MG tablet    Sig: Take two tablets on day 1, then one tablet daily on day 2-5.    Dispense:  6 tablet    Refill:  0    Return in about 3 months (around 04/27/2022).  I have spent a total time of 30-minutes on the day of the appointment including chart review, data review, collecting history, coordinating care and discussing medical diagnosis and plan with the patient/family. Past medical history, allergies, medications were reviewed. Pertinent imaging, labs and tests included in this note have been reviewed and interpreted independently by me.  [place] MD [facility] 01/26/2022 10:38 AM  Office Number [phone]

## 2022-01-28 DIAGNOSIS — H01005 Unspecified blepharitis left lower eyelid: Secondary | ICD-10-CM | POA: Diagnosis not present

## 2022-01-28 DIAGNOSIS — H01002 Unspecified blepharitis right lower eyelid: Secondary | ICD-10-CM | POA: Diagnosis not present

## 2022-01-31 DIAGNOSIS — M419 Scoliosis, unspecified: Secondary | ICD-10-CM | POA: Diagnosis not present

## 2022-01-31 DIAGNOSIS — M40209 Unspecified kyphosis, site unspecified: Secondary | ICD-10-CM | POA: Diagnosis not present

## 2022-02-02 DIAGNOSIS — K219 Gastro-esophageal reflux disease without esophagitis: Secondary | ICD-10-CM | POA: Diagnosis not present

## 2022-02-02 DIAGNOSIS — G43909 Migraine, unspecified, not intractable, without status migrainosus: Secondary | ICD-10-CM | POA: Diagnosis not present

## 2022-02-02 DIAGNOSIS — H409 Unspecified glaucoma: Secondary | ICD-10-CM | POA: Diagnosis not present

## 2022-02-02 DIAGNOSIS — R918 Other nonspecific abnormal finding of lung field: Secondary | ICD-10-CM | POA: Diagnosis not present

## 2022-02-02 DIAGNOSIS — M419 Scoliosis, unspecified: Secondary | ICD-10-CM | POA: Diagnosis not present

## 2022-02-02 DIAGNOSIS — H04123 Dry eye syndrome of bilateral lacrimal glands: Secondary | ICD-10-CM | POA: Diagnosis not present

## 2022-02-02 DIAGNOSIS — G8929 Other chronic pain: Secondary | ICD-10-CM | POA: Diagnosis not present

## 2022-02-02 DIAGNOSIS — L089 Local infection of the skin and subcutaneous tissue, unspecified: Secondary | ICD-10-CM | POA: Diagnosis not present

## 2022-02-02 DIAGNOSIS — Z9181 History of falling: Secondary | ICD-10-CM | POA: Diagnosis not present

## 2022-02-02 DIAGNOSIS — J479 Bronchiectasis, uncomplicated: Secondary | ICD-10-CM | POA: Diagnosis not present

## 2022-02-02 DIAGNOSIS — M5136 Other intervertebral disc degeneration, lumbar region: Secondary | ICD-10-CM | POA: Diagnosis not present

## 2022-02-02 DIAGNOSIS — Z87891 Personal history of nicotine dependence: Secondary | ICD-10-CM | POA: Diagnosis not present

## 2022-02-02 DIAGNOSIS — N1330 Unspecified hydronephrosis: Secondary | ICD-10-CM | POA: Diagnosis not present

## 2022-02-02 DIAGNOSIS — G250 Essential tremor: Secondary | ICD-10-CM | POA: Diagnosis not present

## 2022-02-02 DIAGNOSIS — M8580 Other specified disorders of bone density and structure, unspecified site: Secondary | ICD-10-CM | POA: Diagnosis not present

## 2022-02-02 DIAGNOSIS — Z8616 Personal history of COVID-19: Secondary | ICD-10-CM | POA: Diagnosis not present

## 2022-02-03 ENCOUNTER — Ambulatory Visit (HOSPITAL_BASED_OUTPATIENT_CLINIC_OR_DEPARTMENT_OTHER): Payer: Medicare Other | Admitting: Pulmonary Disease

## 2022-02-06 DIAGNOSIS — R197 Diarrhea, unspecified: Secondary | ICD-10-CM | POA: Diagnosis not present

## 2022-02-07 DIAGNOSIS — R197 Diarrhea, unspecified: Secondary | ICD-10-CM | POA: Diagnosis not present

## 2022-02-08 ENCOUNTER — Encounter: Payer: Medicare Other | Admitting: Vascular Surgery

## 2022-02-08 ENCOUNTER — Ambulatory Visit (HOSPITAL_COMMUNITY): Admission: RE | Admit: 2022-02-08 | Payer: Medicare Other | Source: Ambulatory Visit

## 2022-02-10 DIAGNOSIS — M8580 Other specified disorders of bone density and structure, unspecified site: Secondary | ICD-10-CM | POA: Diagnosis not present

## 2022-02-10 DIAGNOSIS — M5136 Other intervertebral disc degeneration, lumbar region: Secondary | ICD-10-CM | POA: Diagnosis not present

## 2022-02-10 DIAGNOSIS — M419 Scoliosis, unspecified: Secondary | ICD-10-CM | POA: Diagnosis not present

## 2022-02-10 DIAGNOSIS — J479 Bronchiectasis, uncomplicated: Secondary | ICD-10-CM | POA: Diagnosis not present

## 2022-02-10 DIAGNOSIS — G8929 Other chronic pain: Secondary | ICD-10-CM | POA: Diagnosis not present

## 2022-02-10 DIAGNOSIS — L089 Local infection of the skin and subcutaneous tissue, unspecified: Secondary | ICD-10-CM | POA: Diagnosis not present

## 2022-02-11 ENCOUNTER — Other Ambulatory Visit: Payer: Self-pay

## 2022-02-11 ENCOUNTER — Emergency Department (HOSPITAL_BASED_OUTPATIENT_CLINIC_OR_DEPARTMENT_OTHER)
Admission: EM | Admit: 2022-02-11 | Discharge: 2022-02-11 | Disposition: A | Discharge status: Home / Self Care | Payer: Medicare Other | Attending: Emergency Medicine | Admitting: Emergency Medicine

## 2022-02-11 ENCOUNTER — Emergency Department (HOSPITAL_BASED_OUTPATIENT_CLINIC_OR_DEPARTMENT_OTHER): Payer: Medicare Other | Admitting: Radiology

## 2022-02-11 DIAGNOSIS — S60211A Contusion of right wrist, initial encounter: Secondary | ICD-10-CM | POA: Insufficient documentation

## 2022-02-11 DIAGNOSIS — T148XXA Other injury of unspecified body region, initial encounter: Secondary | ICD-10-CM

## 2022-02-11 DIAGNOSIS — Z7982 Long term (current) use of aspirin: Secondary | ICD-10-CM | POA: Insufficient documentation

## 2022-02-11 DIAGNOSIS — S6991XA Unspecified injury of right wrist, hand and finger(s), initial encounter: Secondary | ICD-10-CM | POA: Diagnosis present

## 2022-02-11 DIAGNOSIS — S60221A Contusion of right hand, initial encounter: Secondary | ICD-10-CM | POA: Diagnosis not present

## 2022-02-11 DIAGNOSIS — X58XXXA Exposure to other specified factors, initial encounter: Secondary | ICD-10-CM | POA: Insufficient documentation

## 2022-02-11 DIAGNOSIS — S5011XA Contusion of right forearm, initial encounter: Secondary | ICD-10-CM | POA: Diagnosis not present

## 2022-02-11 DIAGNOSIS — M7989 Other specified soft tissue disorders: Secondary | ICD-10-CM | POA: Diagnosis not present

## 2022-02-11 LAB — PROTIME-INR
INR: 1.3 — ABNORMAL HIGH (ref 0.8–1.2)
Prothrombin Time: 15.6 seconds — ABNORMAL HIGH (ref 11.4–15.2)

## 2022-02-11 LAB — CBC WITH DIFFERENTIAL/PLATELET
Abs Immature Granulocytes: 0.5 10*3/uL — ABNORMAL HIGH (ref 0.00–0.07)
Band Neutrophils: 3 %
Basophils Absolute: 0 10*3/uL (ref 0.0–0.1)
Basophils Relative: 0 %
Eosinophils Absolute: 0 10*3/uL (ref 0.0–0.5)
Eosinophils Relative: 0 %
HCT: 31 % — ABNORMAL LOW (ref 36.0–46.0)
Hemoglobin: 10 g/dL — ABNORMAL LOW (ref 12.0–15.0)
Lymphocytes Relative: 1 %
Lymphs Abs: 0.2 10*3/uL — ABNORMAL LOW (ref 0.7–4.0)
MCH: 31.4 pg (ref 26.0–34.0)
MCHC: 32.3 g/dL (ref 30.0–36.0)
MCV: 97.5 fL (ref 80.0–100.0)
Monocytes Absolute: 0.5 10*3/uL (ref 0.1–1.0)
Monocytes Relative: 3 %
Myelocytes: 2 %
Neutro Abs: 16.2 10*3/uL — ABNORMAL HIGH (ref 1.7–7.7)
Neutrophils Relative %: 90 %
Platelets: 261 10*3/uL (ref 150–400)
Promyelocytes Relative: 1 %
RBC: 3.18 MIL/uL — ABNORMAL LOW (ref 3.87–5.11)
RDW: 16.4 % — ABNORMAL HIGH (ref 11.5–15.5)
Smear Review: ADEQUATE
WBC: 17.4 10*3/uL — ABNORMAL HIGH (ref 4.0–10.5)
nRBC: 0.2 % (ref 0.0–0.2)

## 2022-02-11 LAB — BASIC METABOLIC PANEL
Anion gap: 7 (ref 5–15)
BUN: 35 mg/dL — ABNORMAL HIGH (ref 8–23)
CO2: 23 mmol/L (ref 22–32)
Calcium: 9.3 mg/dL (ref 8.9–10.3)
Chloride: 109 mmol/L (ref 98–111)
Creatinine, Ser: 1.2 mg/dL — ABNORMAL HIGH (ref 0.44–1.00)
GFR, Estimated: 44 mL/min — ABNORMAL LOW (ref >60)
Glucose, Bld: 109 mg/dL — ABNORMAL HIGH (ref 70–99)
Potassium: 4.7 mmol/L (ref 3.5–5.1)
Sodium: 139 mmol/L (ref 135–145)

## 2022-02-11 NOTE — ED Notes (Signed)
Ice pack was placed on hematoma to help with swelling. Pt denies any pain.

## 2022-02-11 NOTE — ED Triage Notes (Signed)
Pt presents w/ growing knot/hematoma on R wrist.  Pt reports having a massage this morning but sts they did not touch her arms or hands.  Denies injury and blood thinners.  Pt has full ROM.

## 2022-02-11 NOTE — Discharge Instructions (Signed)
These keep arm elevated and pressure dressing in place.  Pressure dressing should be comfortable and should not make your hand feel numb or having any pain from the dressing. If you have worsening pain, or any new bleeding anywhere, you should return to the emergency department for reevaluation Please call your hematologist and primary care doctor for reevaluation next week

## 2022-02-11 NOTE — ED Provider Notes (Signed)
[facility] Provider Note   CSN: [id number] Arrival date & time: 02/11/22  1154     History {Add pertinent medical, surgical, social history, OB history to HPI:1} Chief Complaint  Patient presents with  . Wrist Pain    Chelsea Garcia is a 89 y.o. female.  HPI 89 year old female who has a history of thrombocytosis, Jak 2 mutation, presents today complaining of some pain and swelling the dorsal aspect of the right wrist.  No injury was noted.  This occurred fairly abruptly around 10 AM and the swelling was noted immediately.  Has had some slight increase in swelling since that time.     Home Medications Prior to Admission medications   Medication Sig Start Date End Date Taking? Authorizing Provider  acetaminophen (TYLENOL) 500 MG tablet Take 500 mg by mouth every 6 (six) hours as needed for moderate pain.    [provider]  aspirin EC 81 MG tablet Take 81 mg by mouth daily. Swallow whole.    [provider]  azithromycin (ZITHROMAX) 250 MG tablet Take two tablets on day 1, then one tablet daily on day 2-5. 01/26/22   Margaretha Seeds, MD  bismuth subsalicylate (PEPTO BISMOL) 262 MG/15ML suspension Take 30 mLs by mouth every 6 (six) hours as needed.    [provider]  brimonidine (ALPHAGAN) 0.2 % ophthalmic solution Place 1 drop into both eyes 2 (two) times daily. 08/17/20   [provider]  calcium carbonate (OS-CAL) 1250 (500 Ca) MG chewable tablet Chew 1 tablet by mouth daily.    [provider]  fluticasone-salmeterol (WIXELA INHUB) 100-50 MCG/ACT AEPB Inhale 1 puff into the lungs 2 (two) times daily. in the morning and at bedtime. 02/05/21   Margaretha Seeds, MD  Latanoprostene Bunod (VYZULTA) 0.024 % SOLN Apply 1 drop to eye every evening. Both eyes    [provider]  loperamide (IMODIUM A-D) 2 MG tablet Take 2 mg by mouth 4 (four) times daily as needed for diarrhea or loose stools.     [provider]  Multiple Vitamin (MULTI VITAMIN) TABS Take 2 tablets by mouth daily.    [provider]  Netarsudil Dimesylate (RHOPRESSA) 0.02 % SOLN Apply 1 drop to eye every evening. Both eyes    [provider]  Polyethyl Glycol-Propyl Glycol (SYSTANE OP) Apply to eye. Uses eye drops prn for dry eye    [provider]      Allergies    Nystatin, Bee pollen, Pollen extract, Albuterol, Doxycycline, Mangifera indica fruit ext (mango) [mangifera indica], Mite (d. farinae), and Sulfamethoxazole    Review of Systems   Review of Systems  Physical Exam Updated Vital Signs BP (!) 121/58 (BP Location: Left Arm)   Pulse 81   Temp 98.4 F (36.9 C) (Oral)   Resp 16   Ht 1.524 m (5')   Wt 47.6 kg   SpO2 98%   BMI 20.51 kg/m  Physical Exam Vitals reviewed.  Constitutional:      Appearance: Normal appearance.     Comments: Cachectic  HENT:     Head: Normocephalic.     Right Ear: External ear normal.     Left Ear: External ear normal.     Nose: Nose normal.     Mouth/Throat:     Pharynx: Oropharynx is clear.  Eyes:     Pupils: Pupils are equal, round, and reactive to light.  Cardiovascular:     Rate and Rhythm: Normal  rate and regular rhythm.     Pulses: Normal pulses.  Pulmonary:     Effort: Pulmonary effort is normal.  Abdominal:     General: Abdomen is flat.     Palpations: Abdomen is soft.  Musculoskeletal:     Cervical back: Normal range of motion.     Comments: Large hematoma dorsal aspect right distal wrist Pulses intact No point tenderness Full active range of motion of hand, wrist, elbow No swelling or tenderness noted proximally  Skin:    General: Skin is warm and dry.     Capillary Refill: Capillary refill takes less than 2 seconds.     Comments: No rash or purpura noted  Neurological:     General: No focal deficit present.     Mental Status: She is alert.  Psychiatric:        Thought Content: Thought content normal.     ED Results / Procedures / Treatments   Labs (all labs ordered are listed, but only abnormal results are displayed) Labs Reviewed  CBC WITH DIFFERENTIAL/PLATELET  BASIC METABOLIC PANEL  PROTIME-INR    EKG None  Radiology No results found.  Procedures Procedures  {Document cardiac monitor, telemetry assessment procedure when appropriate:1}  Medications Ordered in ED Medications - No data to display  ED Course/ Medical Decision Making/ A&P   {   Click here for ABCD2, HEART and other calculatorsREFRESH Note before signing :1}                          Medical Decision Making Swelling to dorsal aspect of right wrist differential diagnosis includes but is not limited to hematoma, infection, neoplasm Given patient's history of JAK2 mutation and physical exam this is most consistent with hematoma Plan x-Carlyne Keehan, check CBC and INR   Amount and/or Complexity of Data Reviewed Labs: ordered. Radiology: ordered.   Patient was hematoma of right forearm She has some underlying neurologic abnormalities which certainly may be contributing.  Her INR is elevated at 1.3, platelets have normalized to 261,000 hemoglobin is slightly decreased at 10 Patient denies other bleeding.  Hematoma has stabilized here with some cold therapy and we will place some small pressure dressing. She is advised to return if she is having worsening symptoms especially pain distal to the hematoma, other areas of bleeding.  She is advised to follow-up with her hematologist and primary care next week  {Document critical care time when appropriate:1} {Document review of labs and clinical decision tools ie heart score, Chads2Vasc2 etc:1}  {Document your independent review of radiology images, and any outside records:1} {Document your discussion with family members, caretakers, and with consultants:1} {Document social determinants of health affecting pt's care:1} {Document your decision making why or why not  admission, treatments were needed:1} Final Clinical Impression(s) / ED Diagnoses Final diagnoses:  None    Rx / DC Orders ED Discharge Orders     None

## 2022-02-15 ENCOUNTER — Encounter (HOSPITAL_BASED_OUTPATIENT_CLINIC_OR_DEPARTMENT_OTHER): Payer: Self-pay | Admitting: Pulmonary Disease

## 2022-02-15 ENCOUNTER — Ambulatory Visit (INDEPENDENT_AMBULATORY_CARE_PROVIDER_SITE_OTHER): Payer: Medicare Other | Admitting: Pulmonary Disease

## 2022-02-15 VITALS — BP 110/80 | HR 76 | Ht 60.0 in | Wt 107.8 lb

## 2022-02-15 DIAGNOSIS — J479 Bronchiectasis, uncomplicated: Secondary | ICD-10-CM

## 2022-02-15 MED ORDER — FLUTICASONE-SALMETEROL 100-50 MCG/ACT IN AEPB: 1.0000 | INHALATION_SPRAY | Freq: Two times a day (BID) | RESPIRATORY_TRACT | 11 refills | Status: DC

## 2022-02-15 NOTE — Patient Instructions (Addendum)
Bronchiectasis without complication Post-Covid infection - improving --CONTINUE Advair/Wixela 100-50 mcg. Take ONE puff TWICE a day --OK to start taking inhaler as needed during the summer time. If your symptoms (cough, shortness of breath, wheezing), you may need to restart regularly  Follow-up with me in 6 months

## 2022-02-15 NOTE — Progress Notes (Signed)
Subjective:   PATIENT ID: Chelsea Garcia GENDER: female DOB: Jul 14, 1933, MRN: [id number]   HPI  Chief Complaint  Patient presents with   Follow-up    Using advair mostly at night    Reason for Visit: Follow-up bronchiectasis, acute visit  [name] is an 89 year old female former smoker who presents for follow-up for bronchiectasis.  Synopsis: Referred to Pulmonary clinic for CT demonstrating bronchiectasis and tree-in-bud opacities. She has long history of cough, wheezing and chest congestion that has worsened in the summer of 2021.   02/05/21 Since our last visit, she has been compliant with her Advair and reports symptoms are well-controlled. She is able to take walks but limited hip or joint pain. Denies shortness of breath with regular activity. No coughing or wheezing. Denies congestion. Denies respiratory infections in the last year. Has not needed flutter valve in a long time.  01/26/22 Since our last visit she reports having COVID after Christmas. She has unchanged productive cough with thin sputum since then. Denies wheezing. Has nasal congestion that worsens it. Denies fevers, chills. Associated with fatigue especially with exertion. Compliant with Advair nightly. Not taking twice a day since the summer.  02/15/22 Since our last visit she reports her cough and sputum production has improved with Advair. She is consistent with nightly dosing but will miss half of her morning doses. Denies wheezing. She has some shortness of breath with walking. Has not been walking lately due to the cold. Currently working with PT at home twice a week.  Social History: Former smoker. Quit in 1963.   Past Medical History:  Diagnosis Date   Bronchiectasis Chelsea Garcia)    followed by dr Chelsea Garcia completed pulmonary [facility] 06-02-2020   Cellulitis of right foot    recurrent cellulitis last 7 days finishes ciprofloxacin for on 11-26-2020   Colon polyps    Benign. 2 colonoscopies in past. Due  now again.   Complication of anesthesia    "got dizzy in 70's"   Diarrhea    resolved now taking imodium ad prn   Dry eye 11/25/2020   both eyes   Endometriosis    required TAH when young   Essential tremor    since 40's   GERD (gastroesophageal reflux disease)    denis   Glaucoma    started in 40's both eyes   Low back pain    since 40's   Migraine    started in 50's last migraine 10 or 20 yrs ago per pt on 11-25-2020   Osteopenia    2014   Seasonal allergies    to Dust, mold and pollen.   Thrombocytosis 09/2013   hx of resolved follows up with dr Chelsea Garcia oncology   Tick bites    she likes out door activities, 2 this year (2015)   UPJ (ureteropelvic junction) obstruction 11/25/2020   left   Urinary frequency    Wears dentures    upper   Wears glasses      Allergies  Allergen Reactions   Nystatin     Swelling of lips   Bee Pollen Other (See Comments) and Cough    Mill dust, pollen. Mill dust, pollen.   Pollen Extract Cough    Mill dust, pollen.   Albuterol     Increased HR, had to go to the ER   Doxycycline Nausea And Vomiting   Mangifera Indica Fruit Ext (Mango) [Mangifera Indica] Diarrhea   Mite (D. Farinae) Other (See Comments)  General allergy   Sulfamethoxazole Other (See Comments)    Irritation in eyes  Other Other     Outpatient Medications Prior to Visit  Medication Sig Dispense Refill   acetaminophen (TYLENOL) 500 MG tablet Take 500 mg by mouth every 6 (six) hours as needed for moderate pain.     aspirin EC 81 MG tablet Take 81 mg by mouth daily. Swallow whole.     brimonidine (ALPHAGAN) 0.2 % ophthalmic solution Place 1 drop into both eyes 2 (two) times daily.     calcium carbonate (OS-CAL) 1250 (500 Ca) MG chewable tablet Chew 1 tablet by mouth daily.     Latanoprostene Bunod (VYZULTA) 0.024 % SOLN Apply 1 drop to eye every evening. Both eyes     loperamide (IMODIUM A-D) 2 MG tablet Take 2 mg by mouth 4 (four) times daily as needed for  diarrhea or loose stools.     Multiple Vitamin (MULTI VITAMIN) TABS Take 2 tablets by mouth daily.     Netarsudil Dimesylate (RHOPRESSA) 0.02 % SOLN Apply 1 drop to eye every evening. Both eyes     Polyethyl Glycol-Propyl Glycol (SYSTANE OP) Apply to eye. Uses eye drops prn for dry eye     tamsulosin (FLOMAX) 0.4 MG CAPS capsule Take 0.4 mg by mouth at bedtime.     fluticasone-salmeterol (WIXELA INHUB) 100-50 MCG/ACT AEPB Inhale 1 puff into the lungs 2 (two) times daily. in the morning and at bedtime. 60 each 11   levothyroxine (SYNTHROID) 50 MCG tablet Take 50 mcg by mouth every morning.     azithromycin (ZITHROMAX) 250 MG tablet Take two tablets on day 1, then one tablet daily on day 2-5. (Patient not taking: Reported on 02/15/2022) 6 tablet 0   bismuth subsalicylate (PEPTO BISMOL) 262 MG/15ML suspension Take 30 mLs by mouth every 6 (six) hours as needed.     No facility-administered medications prior to visit.    Review of Systems  Constitutional:  Negative for chills, diaphoresis, fever, malaise/fatigue and weight loss.  HENT:  Negative for congestion.   Respiratory:  Positive for cough and sputum production. Negative for hemoptysis, shortness of breath and wheezing.   Cardiovascular:  Negative for chest pain, palpitations and leg swelling.     Objective:   Vitals:   02/15/22 1341  BP: 110/80  Pulse: 76  SpO2: 98%  Weight: 107 lb 12.8 oz (48.9 kg)  Height: 5' (1.524 m)   SpO2: 98 % O2 Device: None (Room air)  Physical Exam: General: Elderly and frail-appearing, no acute distress HENT: Chelsea Garcia, AT Eyes: EOMI, no scleral icterus Respiratory: Coarse breath sounds to auscultation in bases.  No crackles, wheezing or rales Cardiovascular: RRR, -M/R/G, no JVD Extremities:-Edema,-tenderness Neuro: AAO x4, CNII-XII grossly intact Psych: Normal mood, normal affect   Data Reviewed:  Imaging: CT 07/25/19 has bronchiectasis involving in the right middle and lingula associated with  volume loss, scarring and few tree-in-bud opacities CTA 10/15/21  - No PE. Bibasilar bronchiectasis and scarring. Splenomegaly CXR 01/26/22 - LLL infiltrate. Bibasilar bronchiectasis and scarring.  PFT: None on file  Labs: CBC    Component Value Date/Time   WBC 17.4 (H) 02/11/2022 1335   RBC 3.18 (L) 02/11/2022 1335   HGB 10.0 (L) 02/11/2022 1335   HGB 10.0 (L) 01/25/2022 1249   HGB 12.4 12/16/2016 0908   HCT 31.0 (L) 02/11/2022 1335   HCT 38.6 12/16/2016 0908   PLT 261 02/11/2022 1335   PLT 251 01/25/2022 1249   PLT 271 12/16/2016  0908   MCV 97.5 02/11/2022 1335   MCV 101.8 (H) 12/16/2016 0908   MCH 31.4 02/11/2022 1335   MCHC 32.3 02/11/2022 1335   RDW 16.4 (H) 02/11/2022 1335   RDW 15.9 (H) 12/16/2016 0908   LYMPHSABS 0.2 (L) 02/11/2022 1335   LYMPHSABS 0.6 (L) 12/16/2016 0908   MONOABS 0.5 02/11/2022 1335   MONOABS 0.3 12/16/2016 0908   EOSABS 0.0 02/11/2022 1335   EOSABS 0.1 12/16/2016 0908   BASOSABS 0.0 02/11/2022 1335   BASOSABS 0.0 12/16/2016 0908   Absolute eos 12/07/20 - 100  Assessment & Plan:   Discussion: 89 year old female with bronchiectasis who presents for follow-up. Bronchiectasis likely related to prior/recurrent infections. Continues to have some symptoms despite increased ICS/LABA to twice daily. Discussed inhaler compliance.  Bronchiectasis without complication - improved but persistent symptoms Post-Covid infection - improving --CONTINUE Advair/Wixela 100-50 mcg. Take ONE puff TWICE a day --OK to start taking inhaler as needed during the summer time. If your symptoms (cough, shortness of breath, wheezing), you may need to restart regularly   Health Maintenance Immunization History  Administered Date(s) Administered   Fluad Quad(high Dose 65+) 10/09/2019   Influenza Split 11/22/2007, 11/10/2008, 11/06/2009, 10/14/2020   Influenza,inj,Quad PF,6+ Mos 10/30/2015, 11/03/2016, 09/23/2019   Influenza,inj,quad, With Preservative 09/12/2013,  10/09/2014   Influenza-Unspecified 10/11/2013   PFIZER Comirnaty(Gray Top)Covid-19 Tri-Sucrose Vaccine 06/05/2020   PFIZER(Purple Top)SARS-COV-2 Vaccination 01/15/2019, 02/20/2019, 03/05/2019, 03/20/2019, 10/16/2019, 06/05/2020   Pfizer Covid-19 Vaccine Bivalent Booster 67yr & up 10/14/2020   Pneumococcal Conjugate-13 10/09/2014   Pneumococcal Polysaccharide-23 01/16/2003   Td 07/19/1999   Tdap 08/08/2011, 05/27/2019   Zoster, Live 12/27/2006   CT Lung Screen - not qualified  No orders of the defined types were placed in this encounter.  Meds ordered this encounter  Medications   fluticasone-salmeterol (ADVAIR DISKUS) 100-50 MCG/ACT AEPB    Sig: Inhale 1 puff into the lungs 2 (two) times daily.    Dispense:  60 each    Refill:  11    Return in about 6 months (around 08/16/2022).  I have spent a total time of 31-minutes on the day of the appointment including chart review, data review, collecting history, coordinating care and discussing medical diagnosis and plan with the patient/family. Past medical history, allergies, medications were reviewed. Pertinent imaging, labs and tests included in this note have been reviewed and interpreted independently by me.  CSan Acacio MD [facility] 02/15/2022 2:19 PM  Office Number [phone]

## 2022-02-16 DIAGNOSIS — G8929 Other chronic pain: Secondary | ICD-10-CM | POA: Diagnosis not present

## 2022-02-16 DIAGNOSIS — J479 Bronchiectasis, uncomplicated: Secondary | ICD-10-CM | POA: Diagnosis not present

## 2022-02-16 DIAGNOSIS — M8580 Other specified disorders of bone density and structure, unspecified site: Secondary | ICD-10-CM | POA: Diagnosis not present

## 2022-02-16 DIAGNOSIS — L089 Local infection of the skin and subcutaneous tissue, unspecified: Secondary | ICD-10-CM | POA: Diagnosis not present

## 2022-02-16 DIAGNOSIS — M419 Scoliosis, unspecified: Secondary | ICD-10-CM | POA: Diagnosis not present

## 2022-02-16 DIAGNOSIS — M5136 Other intervertebral disc degeneration, lumbar region: Secondary | ICD-10-CM | POA: Diagnosis not present

## 2022-02-17 ENCOUNTER — Ambulatory Visit (HOSPITAL_COMMUNITY)
Admission: RE | Admit: 2022-02-17 | Discharge: 2022-02-17 | Disposition: A | Discharge status: Home / Self Care | Payer: Medicare Other | Source: Ambulatory Visit | Attending: Vascular Surgery | Admitting: Vascular Surgery

## 2022-02-17 ENCOUNTER — Ambulatory Visit (INDEPENDENT_AMBULATORY_CARE_PROVIDER_SITE_OTHER): Payer: Medicare Other | Admitting: Physician Assistant

## 2022-02-17 VITALS — BP 101/52 | HR 71 | Temp 97.9°F | Ht 60.0 in | Wt 101.0 lb

## 2022-02-17 DIAGNOSIS — I878 Other specified disorders of veins: Secondary | ICD-10-CM | POA: Insufficient documentation

## 2022-02-17 DIAGNOSIS — I872 Venous insufficiency (chronic) (peripheral): Secondary | ICD-10-CM | POA: Diagnosis not present

## 2022-02-17 NOTE — Progress Notes (Signed)
Requested by:  Saintclair [address], [place],  [place] [address]  Reason for consultation: bilateral lower extremity edema     History of Present Illness   Chelsea Garcia is a 89 y.o. (12-Nov-1933) female who presents for evaluation of bilateral lower extremity edema.  She states she has dealt with lower leg swelling for a number of years, and it worsened slightly starting in 2020.  Her swelling is equal in both legs and does not affect her feet.  She notices after sleeping in a chair at night with her legs dangling, her legs will be more swollen.  Her leg swelling is typically better during the day because she keeps her legs propped up in a recliner.  She denies any pain with her swelling.  She denies any history of bleeding, ulceration, DVT, previous vein procedures.  She tries to wear support hose daily, which helps her leg swelling a bit.  Past Medical History:  Diagnosis Date   Bronchiectasis [facility])    followed by dr Rayann Heman completed pulmonary rehan 06-02-2020   Cellulitis of right foot    recurrent cellulitis last 7 days finishes ciprofloxacin for on 11-26-2020   Colon polyps    Benign. 2 colonoscopies in past. Due now again.   Complication of anesthesia    "got dizzy in 70's"   Diarrhea    resolved now taking imodium ad prn   Dry eye 11/25/2020   both eyes   Endometriosis    required TAH when young   Essential tremor    since 40's   GERD (gastroesophageal reflux disease)    denis   Glaucoma    started in 40's both eyes   Low back pain    since 40's   Migraine    started in 50's last migraine 10 or 20 yrs ago per pt on 11-25-2020   Osteopenia    2014   Seasonal allergies    to Dust, mold and pollen.   Thrombocytosis 09/2013   hx of resolved follows up with dr Truitt Merle oncology   Tick bites    she likes out door activities, 2 this year (2015)   UPJ (ureteropelvic junction) obstruction 11/25/2020   left   Urinary frequency    Wears dentures     upper   Wears glasses     Past Surgical History:  Procedure Laterality Date   CATARACT EXTRACTION Left 02/07/2013   right 2015   colonscopy     10 or 15 yrs ago per pt on 11-25-2020   CYSTOSCOPY W/ URETERAL STENT PLACEMENT Left 11/26/2020   Procedure: CYSTOSCOPY WITH BILATERAL RETROGRADE PYELOGRAM/URETERAL STENT PLACEMENT;  Surgeon: Festus Aloe, MD;  Location: [facility];  Service: Urology;  Laterality: Left;  ONLY NEEDS 30 MIN   CYSTOSCOPY WITH URETEROSCOPY AND STENT PLACEMENT Left 01/12/2021   Procedure: CYSTOSCOPY WITH URETEROSCOPY AND STENT EXCHANGE;  Surgeon: Festus Aloe, MD;  Location[facility];  Service: Urology;  Laterality: Left;   INGUINAL HERNIA REPAIR Left 01/09/2020   Procedure: LEFT INGUINAL HERNIA REPAIR WITH MESH;  Surgeon: Erroll Luna, MD;  Location: [place];  Service: General;  Laterality: Left;   INSERTION OF MESH Left 01/09/2020   Procedure: INSERTION OF MESH;  Surgeon: Erroll Luna, MD;  Location: [place];  Service: General;  Laterality: Left;   TOTAL ABDOMINAL HYSTERECTOMY  09/10/1968   TUBAL LIGATION     yrs ago    Social  History   Socioeconomic History   Marital status: Married    Spouse name: Not on file   Number of children: Not on file   Years of education: Not on file   Highest education level: Not on file  Occupational History   Not on file  Tobacco Use   Smoking status: Former    Packs/day: 1.00    Years: 8.00    Total pack years: 8.00    Types: Cigarettes    Quit date: 07/01/1961    Years since quitting: 60.6   Smokeless tobacco: Never  Vaping Use   Vaping Use: Never used  Substance and Sexual Activity   Alcohol use: Not Currently   Drug use: No   Sexual activity: Yes  Other Topics Concern   Not on file  Social History Narrative   Patient lives in [place]. Married. Came by herself. She has got 2 sons ages 51 and 66 and 2 grankids boy 69 and  daughter 69. She is active, walks every day, semi-retired accountant.    Social Determinants of Health   Financial Resource Strain: Not on file  Food Insecurity: Not on file  Transportation Needs: Not on file  Physical Activity: Not on file  Stress: Not on file  Social Connections: Not on file  Intimate Partner Violence: Not on file    Family History  Problem Relation Age of Onset   Thyroid disease Mother    Cancer Father    Cancer Maternal Aunt 49    Current Outpatient Medications  Medication Sig Dispense Refill   acetaminophen (TYLENOL) 500 MG tablet Take 500 mg by mouth every 6 (six) hours as needed for moderate pain.     aspirin EC 81 MG tablet Take 81 mg by mouth daily. Swallow whole.     brimonidine (ALPHAGAN) 0.2 % ophthalmic solution Place 1 drop into both eyes 2 (two) times daily.     calcium carbonate (OS-CAL) 1250 (500 Ca) MG chewable tablet Chew 1 tablet by mouth daily.     fluticasone-salmeterol (ADVAIR DISKUS) 100-50 MCG/ACT AEPB Inhale 1 puff into the lungs 2 (two) times daily. 60 each 11   Latanoprostene Bunod (VYZULTA) 0.024 % SOLN Apply 1 drop to eye every evening. Both eyes     levothyroxine (SYNTHROID) 50 MCG tablet Take 50 mcg by mouth every morning.     loperamide (IMODIUM A-D) 2 MG tablet Take 2 mg by mouth 4 (four) times daily as needed for diarrhea or loose stools.     Multiple Vitamin (MULTI VITAMIN) TABS Take 2 tablets by mouth daily.     Netarsudil Dimesylate (RHOPRESSA) 0.02 % SOLN Apply 1 drop to eye every evening. Both eyes     Polyethyl Glycol-Propyl Glycol (SYSTANE OP) Apply to eye. Uses eye drops prn for dry eye     tamsulosin (FLOMAX) 0.4 MG CAPS capsule Take 0.4 mg by mouth at bedtime.     No current facility-administered medications for this visit.    Allergies  Allergen Reactions   Nystatin     Swelling of lips   Bee Pollen Other (See Comments) and Cough    Mill dust, pollen. Mill dust, pollen.   Pollen Extract Cough    Mill dust,  pollen.   Albuterol     Increased HR, had to go to the ER   Doxycycline Nausea And Vomiting   Mangifera Indica Fruit Ext (Mango) [Mangifera Indica] Diarrhea   Mite (D. Farinae) Other (See Comments)    General allergy  Sulfamethoxazole Other (See Comments)    Irritation in eyes  Other Other    REVIEW OF SYSTEMS (negative unless checked):   Cardiac:  '[]'$  Chest pain or chest pressure? '[]'$  Shortness of breath upon activity? '[]'$  Shortness of breath when lying flat? '[]'$  Irregular heart rhythm?  Vascular:  '[]'$  Pain in calf, thigh, or hip brought on by walking? '[]'$  Pain in feet at night that wakes you up from your sleep? '[]'$  Blood clot in your veins? '[x]'$  Leg swelling?  Pulmonary:  '[]'$  Oxygen at home? '[]'$  Productive cough? '[]'$  Wheezing?  Neurologic:  '[]'$  Sudden weakness in arms or legs? '[]'$  Sudden numbness in arms or legs? '[]'$  Sudden onset of difficult speaking or slurred speech? '[]'$  Temporary loss of vision in one eye? '[]'$  Problems with dizziness?  Gastrointestinal:  '[]'$  Blood in stool? '[]'$  Vomited blood?  Genitourinary:  '[]'$  Burning when urinating? '[]'$  Blood in urine?  Psychiatric:  '[]'$  Major depression  Hematologic:  '[]'$  Bleeding problems? '[]'$  Problems with blood clotting?  Dermatologic:  '[]'$  Rashes or ulcers?  Constitutional:  '[]'$  Fever or chills?  Ear/Nose/Throat:  '[]'$  Change in hearing? '[]'$  Nose bleeds? '[]'$  Sore throat?  Musculoskeletal:  '[]'$  Back pain? '[]'$  Joint pain? '[]'$  Muscle pain?   Physical Examination     Vitals:   02/17/22 1222  BP: (!) 101/52  Pulse: 71  Temp: 97.9 F (36.6 C)  TempSrc: Temporal  SpO2: 97%  Weight: 101 lb (45.8 kg)  Height: 5' (1.524 m)   Body mass index is 19.73 kg/m.  General:  WDWN in NAD; vital signs documented above Gait: Not observed HENT: WNL, normocephalic Pulmonary: normal non-labored breathing  Cardiac: regular rate and rhythm Abdomen: soft, NT, no masses Skin: without rashes Vascular Exam/Pulses: Palpable 1+ DP pulses   Extremities: scattered reticular veins on dorsal aspect of both feet. No varicose veins or ulcerations.  2+ pitting edema in bilateral lower extremities, not including the feet Musculoskeletal: no muscle wasting or atrophy  Neurologic: A&O X 3;  No focal weakness or paresthesias are detected Psychiatric:  The pt has Normal affect.  Non-invasive Vascular Imaging   RLE Venous Insufficiency Duplex (02/17/2022):  +--------------+---------+------+-----------+------------+--------+  RIGHT        Reflux NoRefluxReflux TimeDiameter cmsComments                          Yes                                   +--------------+---------+------+-----------+------------+--------+  CFV          no                                              +--------------+---------+------+-----------+------------+--------+  FV prox       no                                              +--------------+---------+------+-----------+------------+--------+  FV mid        no                                              +--------------+---------+------+-----------+------------+--------+  FV dist                 yes   >1 second                       +--------------+---------+------+-----------+------------+--------+  Popliteal    no                                              +--------------+---------+------+-----------+------------+--------+  GSV at SFJ    no                           0.589              +--------------+---------+------+-----------+------------+--------+  GSV prox thighno                           0.351              +--------------+---------+------+-----------+------------+--------+  GSV mid thigh no                            0.31              +--------------+---------+------+-----------+------------+--------+  GSV dist thighno                            0.31               +--------------+---------+------+-----------+------------+--------+  GSV at knee   no                            0.51              +--------------+---------+------+-----------+------------+--------+  GSV prox calf           yes    >500 ms      0.41              +--------------+---------+------+-----------+------------+--------+  SSV Pop Fossa           yes                0.575              +--------------+---------+------+-----------+------------+--------+  SSV prox calf           yes                0.539              +--------------+---------+------+-----------+------------+--------+  SSV mid calf            yes                0.552              +--------------+---------+------+-----------+------------+--------+         Summary:  Right:  Thrombus  - No evidence of deep vein thrombosis seen in the right lower extremity,  from the common femoral through the popliteal veins.  - No evidence of superficial venous thrombosis in the right lower  extremity.    Medical Decision Making   Chelsea Garcia is a 89 y.o. female who presents for evaluation of bilateral lower extremity edema  Based on the patient's duplex study, there is venous reflux in the right distal  femoral vein, GSV in the proximal calf, and in the SSV.  The rest of the deep and superficial venous system is competent.  There is no sign of DVT or SVT in the right lower extremity. The patient has 2+ pitting edema in bilateral lower extremities.  This edema has been present for a number of years and has slightly worsened with time.  The patient notes that prolonged sitting or lack of leg elevation will cause the swelling to worsen.  She denies any history of ulceration or DVT. She is unable to put on compression stockings and has no one to regularly help her place them.  I have showed her how to use Ace wraps to help with lower extremity compression.  She believes that she can do this daily to help  with leg swelling.  I have also educated her on the proper way to elevate her legs.  At this time she would not be a candidate for any venous procedure. She will continue with lower extremity elevation, compression with ace wraps, and exercise as tolerated to help with lower extremity swelling She can follow-up with our office as needed   Gerri Lins, PA-C [facility]: [phone]  02/17/2022, 1:10 PM  Call MD: Virl Cagey

## 2022-02-21 ENCOUNTER — Inpatient Hospital Stay: Payer: Medicare Other | Attending: Hematology

## 2022-02-21 ENCOUNTER — Other Ambulatory Visit: Payer: Self-pay

## 2022-02-21 DIAGNOSIS — D473 Essential (hemorrhagic) thrombocythemia: Secondary | ICD-10-CM

## 2022-02-21 DIAGNOSIS — Z7982 Long term (current) use of aspirin: Secondary | ICD-10-CM | POA: Insufficient documentation

## 2022-02-21 DIAGNOSIS — Z79899 Other long term (current) drug therapy: Secondary | ICD-10-CM | POA: Diagnosis not present

## 2022-02-21 DIAGNOSIS — S60211D Contusion of right wrist, subsequent encounter: Secondary | ICD-10-CM | POA: Diagnosis not present

## 2022-02-21 LAB — CBC WITH DIFFERENTIAL (CANCER CENTER ONLY)
Abs Immature Granulocytes: 2.5 10*3/uL — ABNORMAL HIGH (ref 0.00–0.07)
Basophils Absolute: 0.1 10*3/uL (ref 0.0–0.1)
Basophils Relative: 1 %
Eosinophils Absolute: 0.1 10*3/uL (ref 0.0–0.5)
Eosinophils Relative: 1 %
HCT: 33.3 % — ABNORMAL LOW (ref 36.0–46.0)
Hemoglobin: 10.4 g/dL — ABNORMAL LOW (ref 12.0–15.0)
Immature Granulocytes: 11 %
Lymphocytes Relative: 3 %
Lymphs Abs: 0.7 10*3/uL (ref 0.7–4.0)
MCH: 31 pg (ref 26.0–34.0)
MCHC: 31.2 g/dL (ref 30.0–36.0)
MCV: 99.1 fL (ref 80.0–100.0)
Monocytes Absolute: 1.8 10*3/uL — ABNORMAL HIGH (ref 0.1–1.0)
Monocytes Relative: 8 %
Neutro Abs: 18.4 10*3/uL — ABNORMAL HIGH (ref 1.7–7.7)
Neutrophils Relative %: 76 %
Platelet Count: 262 10*3/uL (ref 150–400)
RBC: 3.36 MIL/uL — ABNORMAL LOW (ref 3.87–5.11)
RDW: 16.1 % — ABNORMAL HIGH (ref 11.5–15.5)
WBC Count: 23.7 10*3/uL — ABNORMAL HIGH (ref 4.0–10.5)
nRBC: 0.2 % (ref 0.0–0.2)

## 2022-02-21 LAB — IRON AND IRON BINDING CAPACITY (CC-WL,HP ONLY)
Iron: 55 ug/dL (ref 28–170)
Saturation Ratios: 18 % (ref 10.4–31.8)
TIBC: 300 ug/dL (ref 250–450)
UIBC: 245 ug/dL (ref 148–442)

## 2022-02-21 LAB — CMP (CANCER CENTER ONLY)
ALT: 10 U/L (ref 0–44)
AST: 12 U/L — ABNORMAL LOW (ref 15–41)
Albumin: 4.2 g/dL (ref 3.5–5.0)
Alkaline Phosphatase: 63 U/L (ref 38–126)
Anion gap: 4 — ABNORMAL LOW (ref 5–15)
BUN: 32 mg/dL — ABNORMAL HIGH (ref 8–23)
CO2: 27 mmol/L (ref 22–32)
Calcium: 9 mg/dL (ref 8.9–10.3)
Chloride: 111 mmol/L (ref 98–111)
Creatinine: 1.4 mg/dL — ABNORMAL HIGH (ref 0.44–1.00)
GFR, Estimated: 37 mL/min — ABNORMAL LOW (ref >60)
Glucose, Bld: 95 mg/dL (ref 70–99)
Potassium: 5 mmol/L (ref 3.5–5.1)
Sodium: 142 mmol/L (ref 135–145)
Total Bilirubin: 0.5 mg/dL (ref 0.3–1.2)
Total Protein: 6.8 g/dL (ref 6.5–8.1)

## 2022-02-21 LAB — VITAMIN B12: Vitamin B-12: 494 pg/mL (ref 180–914)

## 2022-02-21 LAB — RETIC PANEL
Immature Retic Fract: 28.8 % — ABNORMAL HIGH (ref 2.3–15.9)
RBC.: 3.38 MIL/uL — ABNORMAL LOW (ref 3.87–5.11)
Retic Count, Absolute: 119 10*3/uL (ref 19.0–186.0)
Retic Ct Pct: 3.5 % — ABNORMAL HIGH (ref 0.4–3.1)
Reticulocyte Hemoglobin: 28.3 pg (ref >27.9)

## 2022-02-21 LAB — FOLATE: Folate: 10 ng/mL (ref >5.9)

## 2022-02-21 LAB — FERRITIN: Ferritin: 65 ng/mL (ref 11–307)

## 2022-02-22 DIAGNOSIS — D649 Anemia, unspecified: Secondary | ICD-10-CM | POA: Insufficient documentation

## 2022-02-22 NOTE — Progress Notes (Unsigned)
[place]   [phone] [phone]   Clinic Follow up Note   Patient Care Team: Lujean Amel, MD as PCP - General (Family Medicine) Bernadene Bell, MD (Inactive) as Consulting Physician (Hematology) Truitt Merle, MD as Attending Physician (Hematology and Oncology)  Date of Service:  02/23/2022  I connected with Chelsea Garcia on 02/23/2022 at  9:00 AM EST by telephone visit and verified that I am speaking with the correct person using two identifiers.  I discussed the limitations, risks, security and privacy concerns of performing an evaluation and management service by telephone and the availability of in person appointments. I also discussed with the patient that there may be a patient responsible charge related to this service. The patient expressed understanding and agreed to proceed.   Other persons participating in the visit and their role in the encounter:  No  Patient's location:  Home Provider's location:  OFFICE  CHIEF COMPLAINT: f/u of abnormal CBC    CURRENT THERAPY:  Surveillance  ASSESSMENT & PLAN:  Chelsea Garcia is a 89 y.o. female wit   Essential thrombocytosis -JAK2 (+) --Diagnosed in 11/2013 with plt initially in 600k range. Due to her advanced age, she is at high risk for thrombosis secondary to ET.  -She had been taking hydrea since 2015, mostly at low dose and tolerated well. Given the good control of her ET, we stopped her hydrea since 02/05/20. If her platelets increase over 500K, I would recommend restarting hydrea. She is agreeable. -Labs reviewed, her platelet count has been normal, she has persistent mild leukocytosis, with predominant neutrophil.  She has also developed mild anemia lately.  Anemia -She has developed mild anemia in the past 4 months, normocytic -Labs from yesterday showed slightly elevated reticulocyte count, normal serum iron level and TIBC, ferritin 65, normal B12 level. -She has developed chronic kidney  disease with EGFR 37, she probably has a component of anemia of chronic disease due to CKD -I will check haptoglobin, LDH, and Coombs test to rule out hemolysis on her next lab  -Her anemia could certainly be related to her MPN, possible myelofibrosis given her longstanding history of ET.  I discussed the role for bone marrow biopsy, however given her advanced age, overall mild anemia, I will hold on bone marrow biopsy for now. -I recommend her to start prenatal MVI, will repeat CBC in 6 weeks, to see if her anemia improves.  Given her CKD, I would like to keep her ferritin above 100    PLAN: -lab reviewed, we reviewed her anemia and workup so far  -repeat lab in 6 weeks, if ferritin still below 100, will offer iv iron  -she has recently started prenatle vitamins -phone visit in 6-7 weeks after lab    INTERVAL HISTORY:  Chelsea Garcia was contacted for a follow up of abnormal CBC  . She was last seen by me on 10/22/2021. Marland Kitchen Pt states that she had an hematoma on her right wrist.Pt stated she bumped it about 6 days ago.Pt denied having any other bleeding in the last 3 months.Pt states she is taking Prenatal vitamins and she started 3 days ago.    All other systems were reviewed with the patient and are negative.  MEDICAL HISTORY:  Past Medical History:  Diagnosis Date   Bronchiectasis [facility])    followed by dr Rayann Heman completed pulmonary rehan 06-02-2020   Cellulitis of right foot    recurrent cellulitis last 7 days finishes ciprofloxacin for on 11-26-2020  Colon polyps    Benign. 2 colonoscopies in past. Due now again.   Complication of anesthesia    "got dizzy in 70's"   Diarrhea    resolved now taking imodium ad prn   Dry eye 11/25/2020   both eyes   Endometriosis    required TAH when young   Essential tremor    since 40's   GERD (gastroesophageal reflux disease)    denis   Glaucoma    started in 40's both eyes   Low back pain    since 40's   Migraine    started in 50's  last migraine 10 or 20 yrs ago per pt on 11-25-2020   Osteopenia    2014   Seasonal allergies    to Dust, mold and pollen.   Thrombocytosis 09/2013   hx of resolved follows up with dr Truitt Merle oncology   Tick bites    she likes out door activities, 2 this year (2015)   UPJ (ureteropelvic junction) obstruction 11/25/2020   left   Urinary frequency    Wears dentures    upper   Wears glasses     SURGICAL HISTORY: Past Surgical History:  Procedure Laterality Date   CATARACT EXTRACTION Left 02/07/2013   right 2015   colonscopy     10 or 15 yrs ago per pt on 11-25-2020   CYSTOSCOPY W/ URETERAL STENT PLACEMENT Left 11/26/2020   Procedure: CYSTOSCOPY WITH BILATERAL RETROGRADE PYELOGRAM/URETERAL STENT PLACEMENT;  Surgeon: Festus Aloe, MD;  Location[facility];  Service: Urology;  Laterality: Left;  ONLY NEEDS 30 MIN   CYSTOSCOPY WITH URETEROSCOPY AND STENT PLACEMENT Left 01/12/2021   Procedure: CYSTOSCOPY WITH URETEROSCOPY AND STENT EXCHANGE;  Surgeon: Festus Aloe, MD;  Location: [facility];  Service: Urology;  Laterality: Left;   INGUINAL HERNIA REPAIR Left 01/09/2020   Procedure: LEFT INGUINAL HERNIA REPAIR WITH MESH;  Surgeon: Erroll Luna, MD;  Location: [place];  Service: General;  Laterality: Left;   INSERTION OF MESH Left 01/09/2020   Procedure: INSERTION OF MESH;  Surgeon: Erroll Luna, MD;  Location: [place];  Service: General;  Laterality: Left;   TOTAL ABDOMINAL HYSTERECTOMY  09/10/1968   TUBAL LIGATION     yrs ago    I have reviewed the social history and family history with the patient and they are unchanged from previous note.  ALLERGIES:  is allergic to nystatin, bee pollen, pollen extract, albuterol, doxycycline, mangifera indica fruit ext (mango) [mangifera indica], mite (d. farinae), and sulfamethoxazole.  MEDICATIONS:  Current Outpatient Medications  Medication Sig Dispense Refill    acetaminophen (TYLENOL) 500 MG tablet Take 500 mg by mouth every 6 (six) hours as needed for moderate pain.     aspirin EC 81 MG tablet Take 81 mg by mouth daily. Swallow whole.     brimonidine (ALPHAGAN) 0.2 % ophthalmic solution Place 1 drop into both eyes 2 (two) times daily.     calcium carbonate (OS-CAL) 1250 (500 Ca) MG chewable tablet Chew 1 tablet by mouth daily.     fluticasone-salmeterol (ADVAIR DISKUS) 100-50 MCG/ACT AEPB Inhale 1 puff into the lungs 2 (two) times daily. 60 each 11   Latanoprostene Bunod (VYZULTA) 0.024 % SOLN Apply 1 drop to eye every evening. Both eyes     levothyroxine (SYNTHROID) 50 MCG tablet Take 50 mcg by mouth every morning.     loperamide (IMODIUM A-D) 2 MG tablet Take 2 mg by mouth 4 (four)  times daily as needed for diarrhea or loose stools.     Multiple Vitamin (MULTI VITAMIN) TABS Take 2 tablets by mouth daily.     Netarsudil Dimesylate (RHOPRESSA) 0.02 % SOLN Apply 1 drop to eye every evening. Both eyes     Polyethyl Glycol-Propyl Glycol (SYSTANE OP) Apply to eye. Uses eye drops prn for dry eye     tamsulosin (FLOMAX) 0.4 MG CAPS capsule Take 0.4 mg by mouth at bedtime.     No current facility-administered medications for this visit.    PHYSICAL EXAMINATION: ECOG PERFORMANCE STATUS: 1 - Symptomatic but completely ambulatory  There were no vitals filed for this visit. Wt Readings from Last 3 Encounters:  02/17/22 101 lb (45.8 kg)  02/15/22 107 lb 12.8 oz (48.9 kg)  02/11/22 105 lb (47.6 kg)     No vitals taken today, Exam not performed today  LABORATORY DATA:  I have reviewed the data as listed    Latest Ref Rng & Units 02/21/2022    9:16 AM 02/11/2022    1:35 PM 01/25/2022   12:49 PM  CBC  WBC 4.0 - 10.5 K/uL 23.7  17.4  17.4   Hemoglobin 12.0 - 15.0 g/dL 10.4  10.0  10.0   Hematocrit 36.0 - 46.0 % 33.3  31.0  30.7   Platelets 150 - 400 K/uL 262  261  251         Latest Ref Rng & Units 02/21/2022    9:16 AM 02/11/2022    1:35 PM  10/15/2021    7:47 PM  CMP  Glucose 70 - 99 mg/dL 95  109  104   BUN 8 - 23 mg/dL 32  35  46   Creatinine 0.44 - 1.00 mg/dL 1.40  1.20  1.25   Sodium 135 - 145 mmol/L 142  139  139   Potassium 3.5 - 5.1 mmol/L 5.0  4.7  5.4   Chloride 98 - 111 mmol/L 111  109  116   CO2 22 - 32 mmol/L 27  23  18   $ Calcium 8.9 - 10.3 mg/dL 9.0  9.3  8.6   Total Protein 6.5 - 8.1 g/dL 6.8     Total Bilirubin 0.3 - 1.2 mg/dL 0.5     Alkaline Phos 38 - 126 U/L 63     AST 15 - 41 U/L 12     ALT 0 - 44 U/L 10         RADIOGRAPHIC STUDIES: I have personally reviewed the radiological images as listed and agreed with the findings in the report. No results found.    No orders of the defined types were placed in this encounter.  All questions were answered. The patient knows to call the clinic with any problems, questions or concerns. No barriers to learning was detected. The total time spent in the appointment was 15 minutes.     Truitt Merle, MD 02/23/2022   Felicity Coyer am acting as scribe for Truitt Merle, MD.   I have reviewed the above documentation for accuracy and completeness, and I agree with the above.

## 2022-02-22 NOTE — Assessment & Plan Note (Signed)
-  JAK2 (+) --Diagnosed in 11/2013 with plt initially in 600k range. Due to her advanced age, she is at high risk for thrombosis secondary to ET.  -She had been taking hydrea since 2015, mostly at low dose and tolerated well. Given the good control of her ET, we stopped her hydrea since 02/05/20. If her platelets increase over 500K, I would recommend restarting hydrea. She is agreeable. -Labs reviewed, her platelet count has been normal, she has persistent mild leukocytosis, with predominant neutrophil.  She has also developed mild anemia lately.

## 2022-02-22 NOTE — Assessment & Plan Note (Signed)
-  She has developed mild anemia in the past 4 months, normocytic -Labs from yesterday showed slightly elevated reticulocyte count, normal serum iron level and TIBC, ferritin 65, normal B12 level. -She has developed chronic kidney disease with EGFR 37, she probably has a component of anemia of chronic disease due to CKD -I will check haptoglobin, LDH, and Coombs test to rule out hemolysis -Her anemia could certainly be related to her MPN, possible myelofibrosis given her longstanding history of ET.  I discussed the role for bone marrow biopsy, however given her advanced age, overall mild anemia, I will hold on bone marrow biopsy for now. -I recommend her to start oral iron, will repeat CBC in 2 to 3 months, to see if her anemia improves.  Given her CKD, I would like to keep her ferritin above 100

## 2022-02-23 ENCOUNTER — Encounter: Payer: Self-pay | Admitting: Hematology

## 2022-02-23 ENCOUNTER — Inpatient Hospital Stay (HOSPITAL_BASED_OUTPATIENT_CLINIC_OR_DEPARTMENT_OTHER): Payer: Medicare Other | Admitting: Hematology

## 2022-02-23 ENCOUNTER — Encounter: Payer: Self-pay | Admitting: Podiatry

## 2022-02-23 ENCOUNTER — Ambulatory Visit (INDEPENDENT_AMBULATORY_CARE_PROVIDER_SITE_OTHER): Payer: Medicare Other | Admitting: Podiatry

## 2022-02-23 DIAGNOSIS — D649 Anemia, unspecified: Secondary | ICD-10-CM | POA: Diagnosis not present

## 2022-02-23 DIAGNOSIS — D473 Essential (hemorrhagic) thrombocythemia: Secondary | ICD-10-CM | POA: Diagnosis not present

## 2022-02-23 DIAGNOSIS — M7752 Other enthesopathy of left foot: Secondary | ICD-10-CM | POA: Diagnosis not present

## 2022-02-24 DIAGNOSIS — M810 Age-related osteoporosis without current pathological fracture: Secondary | ICD-10-CM | POA: Diagnosis not present

## 2022-02-24 DIAGNOSIS — E038 Other specified hypothyroidism: Secondary | ICD-10-CM | POA: Diagnosis not present

## 2022-02-24 DIAGNOSIS — D649 Anemia, unspecified: Secondary | ICD-10-CM | POA: Diagnosis not present

## 2022-02-24 DIAGNOSIS — E7800 Pure hypercholesterolemia, unspecified: Secondary | ICD-10-CM | POA: Diagnosis not present

## 2022-02-24 DIAGNOSIS — J479 Bronchiectasis, uncomplicated: Secondary | ICD-10-CM | POA: Diagnosis not present

## 2022-02-24 DIAGNOSIS — H409 Unspecified glaucoma: Secondary | ICD-10-CM | POA: Diagnosis not present

## 2022-02-24 NOTE — Progress Notes (Signed)
Subjective:   Patient ID: Chelsea Garcia, female   DOB: 89 y.o.   MRN: [id number]   HPI Patient presents with caregiver with concerns about irritation inflammation of the plantar aspect of the left foot with also possibility for lesion formation which could be part of this issue   ROS      Objective:  Physical Exam  Neurovascular status unchanged she is wearing compression for probable lower leg ulceration venous with inflammation of the left plantar foot mild in its intensity with possibility for lesion formation     Assessment:  Inflammatory capsulitis left with possibility for slight lesion formation low intensity at this time     Plan:  H&P reviewed good shoe gear would be of best benefit for this patient I did do a small debridement of an area of plantar left courtesy I cannot tell whether this will solve the problem may require injections or other treatments if symptoms persist but we will try shoe gear modification first

## 2022-03-04 DIAGNOSIS — J479 Bronchiectasis, uncomplicated: Secondary | ICD-10-CM | POA: Diagnosis not present

## 2022-03-04 DIAGNOSIS — G43909 Migraine, unspecified, not intractable, without status migrainosus: Secondary | ICD-10-CM | POA: Diagnosis not present

## 2022-03-04 DIAGNOSIS — K219 Gastro-esophageal reflux disease without esophagitis: Secondary | ICD-10-CM | POA: Diagnosis not present

## 2022-03-04 DIAGNOSIS — Z9181 History of falling: Secondary | ICD-10-CM | POA: Diagnosis not present

## 2022-03-04 DIAGNOSIS — R918 Other nonspecific abnormal finding of lung field: Secondary | ICD-10-CM | POA: Diagnosis not present

## 2022-03-04 DIAGNOSIS — G8929 Other chronic pain: Secondary | ICD-10-CM | POA: Diagnosis not present

## 2022-03-04 DIAGNOSIS — Z8616 Personal history of COVID-19: Secondary | ICD-10-CM | POA: Diagnosis not present

## 2022-03-04 DIAGNOSIS — L089 Local infection of the skin and subcutaneous tissue, unspecified: Secondary | ICD-10-CM | POA: Diagnosis not present

## 2022-03-04 DIAGNOSIS — M419 Scoliosis, unspecified: Secondary | ICD-10-CM | POA: Diagnosis not present

## 2022-03-04 DIAGNOSIS — H04123 Dry eye syndrome of bilateral lacrimal glands: Secondary | ICD-10-CM | POA: Diagnosis not present

## 2022-03-04 DIAGNOSIS — Z87891 Personal history of nicotine dependence: Secondary | ICD-10-CM | POA: Diagnosis not present

## 2022-03-04 DIAGNOSIS — G250 Essential tremor: Secondary | ICD-10-CM | POA: Diagnosis not present

## 2022-03-04 DIAGNOSIS — M5136 Other intervertebral disc degeneration, lumbar region: Secondary | ICD-10-CM | POA: Diagnosis not present

## 2022-03-04 DIAGNOSIS — H409 Unspecified glaucoma: Secondary | ICD-10-CM | POA: Diagnosis not present

## 2022-03-04 DIAGNOSIS — N1330 Unspecified hydronephrosis: Secondary | ICD-10-CM | POA: Diagnosis not present

## 2022-03-04 DIAGNOSIS — M8580 Other specified disorders of bone density and structure, unspecified site: Secondary | ICD-10-CM | POA: Diagnosis not present

## 2022-03-08 DIAGNOSIS — L0390 Cellulitis, unspecified: Secondary | ICD-10-CM | POA: Diagnosis not present

## 2022-03-08 DIAGNOSIS — R600 Localized edema: Secondary | ICD-10-CM | POA: Diagnosis not present

## 2022-03-15 ENCOUNTER — Telehealth: Payer: Self-pay | Admitting: Family Medicine

## 2022-03-15 NOTE — Telephone Encounter (Signed)
Reached out to patient to reschedule , provider out of office that day. Patient aware of date and time of appointment.

## 2022-03-17 DIAGNOSIS — L089 Local infection of the skin and subcutaneous tissue, unspecified: Secondary | ICD-10-CM | POA: Diagnosis not present

## 2022-03-17 DIAGNOSIS — M8580 Other specified disorders of bone density and structure, unspecified site: Secondary | ICD-10-CM | POA: Diagnosis not present

## 2022-03-17 DIAGNOSIS — G8929 Other chronic pain: Secondary | ICD-10-CM | POA: Diagnosis not present

## 2022-03-17 DIAGNOSIS — M419 Scoliosis, unspecified: Secondary | ICD-10-CM | POA: Diagnosis not present

## 2022-03-17 DIAGNOSIS — J479 Bronchiectasis, uncomplicated: Secondary | ICD-10-CM | POA: Diagnosis not present

## 2022-03-17 DIAGNOSIS — M5136 Other intervertebral disc degeneration, lumbar region: Secondary | ICD-10-CM | POA: Diagnosis not present

## 2022-03-21 DIAGNOSIS — L03115 Cellulitis of right lower limb: Secondary | ICD-10-CM | POA: Diagnosis not present

## 2022-03-21 DIAGNOSIS — L03116 Cellulitis of left lower limb: Secondary | ICD-10-CM | POA: Diagnosis not present

## 2022-03-23 DIAGNOSIS — L03115 Cellulitis of right lower limb: Secondary | ICD-10-CM | POA: Diagnosis not present

## 2022-03-23 DIAGNOSIS — L03116 Cellulitis of left lower limb: Secondary | ICD-10-CM | POA: Diagnosis not present

## 2022-03-24 DIAGNOSIS — L03119 Cellulitis of unspecified part of limb: Secondary | ICD-10-CM | POA: Diagnosis not present

## 2022-03-28 DIAGNOSIS — Z79899 Other long term (current) drug therapy: Secondary | ICD-10-CM | POA: Diagnosis not present

## 2022-03-28 DIAGNOSIS — J479 Bronchiectasis, uncomplicated: Secondary | ICD-10-CM | POA: Diagnosis not present

## 2022-03-28 DIAGNOSIS — Z0001 Encounter for general adult medical examination with abnormal findings: Secondary | ICD-10-CM | POA: Diagnosis not present

## 2022-03-28 DIAGNOSIS — E7800 Pure hypercholesterolemia, unspecified: Secondary | ICD-10-CM | POA: Diagnosis not present

## 2022-03-28 DIAGNOSIS — M810 Age-related osteoporosis without current pathological fracture: Secondary | ICD-10-CM | POA: Diagnosis not present

## 2022-03-28 DIAGNOSIS — E038 Other specified hypothyroidism: Secondary | ICD-10-CM | POA: Diagnosis not present

## 2022-03-28 DIAGNOSIS — D473 Essential (hemorrhagic) thrombocythemia: Secondary | ICD-10-CM | POA: Diagnosis not present

## 2022-03-28 DIAGNOSIS — L03116 Cellulitis of left lower limb: Secondary | ICD-10-CM | POA: Diagnosis not present

## 2022-03-28 DIAGNOSIS — L03115 Cellulitis of right lower limb: Secondary | ICD-10-CM | POA: Diagnosis not present

## 2022-03-28 DIAGNOSIS — R7309 Other abnormal glucose: Secondary | ICD-10-CM | POA: Diagnosis not present

## 2022-03-28 DIAGNOSIS — I700 Atherosclerosis of aorta: Secondary | ICD-10-CM | POA: Diagnosis not present

## 2022-03-29 ENCOUNTER — Ambulatory Visit: Payer: Medicare Other | Admitting: Podiatry

## 2022-03-30 ENCOUNTER — Encounter (HOSPITAL_COMMUNITY): Payer: Self-pay

## 2022-03-30 ENCOUNTER — Inpatient Hospital Stay (HOSPITAL_COMMUNITY)
Admission: EM | Admit: 2022-03-30 | Discharge: 2022-04-04 | DRG: 563 | Disposition: A | Discharge status: Skilled Nursing Facility | Payer: Medicare Other | Attending: Internal Medicine | Admitting: Internal Medicine

## 2022-03-30 ENCOUNTER — Emergency Department (HOSPITAL_COMMUNITY): Payer: Medicare Other

## 2022-03-30 ENCOUNTER — Other Ambulatory Visit: Payer: Self-pay

## 2022-03-30 ENCOUNTER — Inpatient Hospital Stay (HOSPITAL_COMMUNITY): Payer: Medicare Other

## 2022-03-30 DIAGNOSIS — Z86010 Personal history of colonic polyps: Secondary | ICD-10-CM

## 2022-03-30 DIAGNOSIS — Z881 Allergy status to other antibiotic agents status: Secondary | ICD-10-CM | POA: Diagnosis not present

## 2022-03-30 DIAGNOSIS — Z23 Encounter for immunization: Secondary | ICD-10-CM | POA: Diagnosis not present

## 2022-03-30 DIAGNOSIS — Z8349 Family history of other endocrine, nutritional and metabolic diseases: Secondary | ICD-10-CM

## 2022-03-30 DIAGNOSIS — L03115 Cellulitis of right lower limb: Secondary | ICD-10-CM | POA: Diagnosis present

## 2022-03-30 DIAGNOSIS — G250 Essential tremor: Secondary | ICD-10-CM | POA: Diagnosis not present

## 2022-03-30 DIAGNOSIS — D638 Anemia in other chronic diseases classified elsewhere: Secondary | ICD-10-CM | POA: Diagnosis present

## 2022-03-30 DIAGNOSIS — E039 Hypothyroidism, unspecified: Secondary | ICD-10-CM | POA: Diagnosis present

## 2022-03-30 DIAGNOSIS — L03116 Cellulitis of left lower limb: Secondary | ICD-10-CM | POA: Diagnosis present

## 2022-03-30 DIAGNOSIS — E871 Hypo-osmolality and hyponatremia: Secondary | ICD-10-CM | POA: Diagnosis not present

## 2022-03-30 DIAGNOSIS — S42211A Unspecified displaced fracture of surgical neck of right humerus, initial encounter for closed fracture: Secondary | ICD-10-CM | POA: Diagnosis not present

## 2022-03-30 DIAGNOSIS — R2681 Unsteadiness on feet: Secondary | ICD-10-CM | POA: Diagnosis not present

## 2022-03-30 DIAGNOSIS — Z1152 Encounter for screening for COVID-19: Secondary | ICD-10-CM

## 2022-03-30 DIAGNOSIS — I872 Venous insufficiency (chronic) (peripheral): Secondary | ICD-10-CM | POA: Diagnosis present

## 2022-03-30 DIAGNOSIS — M8580 Other specified disorders of bone density and structure, unspecified site: Secondary | ICD-10-CM | POA: Diagnosis present

## 2022-03-30 DIAGNOSIS — Z79890 Hormone replacement therapy (postmenopausal): Secondary | ICD-10-CM

## 2022-03-30 DIAGNOSIS — Z043 Encounter for examination and observation following other accident: Secondary | ICD-10-CM | POA: Diagnosis not present

## 2022-03-30 DIAGNOSIS — G43909 Migraine, unspecified, not intractable, without status migrainosus: Secondary | ICD-10-CM | POA: Diagnosis present

## 2022-03-30 DIAGNOSIS — Z882 Allergy status to sulfonamides status: Secondary | ICD-10-CM

## 2022-03-30 DIAGNOSIS — M6281 Muscle weakness (generalized): Secondary | ICD-10-CM | POA: Diagnosis not present

## 2022-03-30 DIAGNOSIS — K219 Gastro-esophageal reflux disease without esophagitis: Secondary | ICD-10-CM | POA: Diagnosis not present

## 2022-03-30 DIAGNOSIS — Y9301 Activity, walking, marching and hiking: Secondary | ICD-10-CM | POA: Diagnosis present

## 2022-03-30 DIAGNOSIS — D72829 Elevated white blood cell count, unspecified: Secondary | ICD-10-CM | POA: Diagnosis present

## 2022-03-30 DIAGNOSIS — Z7982 Long term (current) use of aspirin: Secondary | ICD-10-CM

## 2022-03-30 DIAGNOSIS — S42351A Displaced comminuted fracture of shaft of humerus, right arm, initial encounter for closed fracture: Secondary | ICD-10-CM | POA: Diagnosis not present

## 2022-03-30 DIAGNOSIS — D473 Essential (hemorrhagic) thrombocythemia: Secondary | ICD-10-CM | POA: Diagnosis present

## 2022-03-30 DIAGNOSIS — S42213A Unspecified displaced fracture of surgical neck of unspecified humerus, initial encounter for closed fracture: Principal | ICD-10-CM | POA: Diagnosis present

## 2022-03-30 DIAGNOSIS — Z888 Allergy status to other drugs, medicaments and biological substances status: Secondary | ICD-10-CM | POA: Diagnosis not present

## 2022-03-30 DIAGNOSIS — Z7951 Long term (current) use of inhaled steroids: Secondary | ICD-10-CM

## 2022-03-30 DIAGNOSIS — J479 Bronchiectasis, uncomplicated: Secondary | ICD-10-CM | POA: Diagnosis present

## 2022-03-30 DIAGNOSIS — S42221A 2-part displaced fracture of surgical neck of right humerus, initial encounter for closed fracture: Secondary | ICD-10-CM | POA: Diagnosis not present

## 2022-03-30 DIAGNOSIS — S80212A Abrasion, left knee, initial encounter: Secondary | ICD-10-CM | POA: Diagnosis not present

## 2022-03-30 DIAGNOSIS — R1311 Dysphagia, oral phase: Secondary | ICD-10-CM | POA: Diagnosis not present

## 2022-03-30 DIAGNOSIS — M6259 Muscle wasting and atrophy, not elsewhere classified, multiple sites: Secondary | ICD-10-CM | POA: Diagnosis not present

## 2022-03-30 DIAGNOSIS — Z87891 Personal history of nicotine dependence: Secondary | ICD-10-CM | POA: Diagnosis not present

## 2022-03-30 DIAGNOSIS — W19XXXA Unspecified fall, initial encounter: Secondary | ICD-10-CM | POA: Diagnosis not present

## 2022-03-30 DIAGNOSIS — K5900 Constipation, unspecified: Secondary | ICD-10-CM | POA: Diagnosis present

## 2022-03-30 DIAGNOSIS — S42291D Other displaced fracture of upper end of right humerus, subsequent encounter for fracture with routine healing: Secondary | ICD-10-CM | POA: Diagnosis not present

## 2022-03-30 DIAGNOSIS — H04123 Dry eye syndrome of bilateral lacrimal glands: Secondary | ICD-10-CM | POA: Diagnosis present

## 2022-03-30 DIAGNOSIS — S42291A Other displaced fracture of upper end of right humerus, initial encounter for closed fracture: Principal | ICD-10-CM | POA: Diagnosis present

## 2022-03-30 DIAGNOSIS — S42201A Unspecified fracture of upper end of right humerus, initial encounter for closed fracture: Secondary | ICD-10-CM | POA: Diagnosis not present

## 2022-03-30 DIAGNOSIS — H409 Unspecified glaucoma: Secondary | ICD-10-CM | POA: Diagnosis present

## 2022-03-30 DIAGNOSIS — R609 Edema, unspecified: Secondary | ICD-10-CM | POA: Diagnosis not present

## 2022-03-30 DIAGNOSIS — W010XXA Fall on same level from slipping, tripping and stumbling without subsequent striking against object, initial encounter: Secondary | ICD-10-CM | POA: Diagnosis present

## 2022-03-30 DIAGNOSIS — Z741 Need for assistance with personal care: Secondary | ICD-10-CM | POA: Diagnosis not present

## 2022-03-30 DIAGNOSIS — N179 Acute kidney failure, unspecified: Secondary | ICD-10-CM | POA: Diagnosis present

## 2022-03-30 LAB — CBC
HCT: 30.9 % — ABNORMAL LOW (ref 36.0–46.0)
Hemoglobin: 9.3 g/dL — ABNORMAL LOW (ref 12.0–15.0)
MCH: 29.2 pg (ref 26.0–34.0)
MCHC: 30.1 g/dL (ref 30.0–36.0)
MCV: 97.2 fL (ref 80.0–100.0)
Platelets: 269 10*3/uL (ref 150–400)
RBC: 3.18 MIL/uL — ABNORMAL LOW (ref 3.87–5.11)
RDW: 15.9 % — ABNORMAL HIGH (ref 11.5–15.5)
WBC: 31 10*3/uL — ABNORMAL HIGH (ref 4.0–10.5)
nRBC: 0.1 % (ref 0.0–0.2)

## 2022-03-30 LAB — BASIC METABOLIC PANEL
Anion gap: 8 (ref 5–15)
BUN: 44 mg/dL — ABNORMAL HIGH (ref 8–23)
CO2: 24 mmol/L (ref 22–32)
Calcium: 8.7 mg/dL — ABNORMAL LOW (ref 8.9–10.3)
Chloride: 105 mmol/L (ref 98–111)
Creatinine, Ser: 1.7 mg/dL — ABNORMAL HIGH (ref 0.44–1.00)
GFR, Estimated: 29 mL/min — ABNORMAL LOW (ref >60)
Glucose, Bld: 106 mg/dL — ABNORMAL HIGH (ref 70–99)
Potassium: 4.9 mmol/L (ref 3.5–5.1)
Sodium: 137 mmol/L (ref 135–145)

## 2022-03-30 MED ORDER — NETARSUDIL DIMESYLATE 0.02 % OP SOLN: 1.0000 [drp] | Freq: Every evening | OPHTHALMIC | Status: DC

## 2022-03-30 MED ORDER — MORPHINE SULFATE (PF) 2 MG/ML IV SOLN
2.0000 mg | Freq: Once | INTRAVENOUS | Status: AC
  Administered 2022-03-30: 2 mg via INTRAVENOUS
  Filled 2022-03-30: qty 1

## 2022-03-30 MED ORDER — POLYVINYL ALCOHOL 1.4 % OP SOLN: 1.0000 [drp] | Freq: Every day | OPHTHALMIC | Status: DC | PRN

## 2022-03-30 MED ORDER — SULFAMETHOXAZOLE-TRIMETHOPRIM 400-80 MG PO TABS
1.0000 | ORAL_TABLET | Freq: Two times a day (BID) | ORAL | Status: DC
  Administered 2022-03-31 – 2022-04-01 (×3): 1 via ORAL
  Filled 2022-03-30 (×3): qty 1

## 2022-03-30 MED ORDER — PROCHLORPERAZINE EDISYLATE 10 MG/2ML IJ SOLN: 5.0000 mg | Freq: Four times a day (QID) | INTRAMUSCULAR | Status: DC | PRN

## 2022-03-30 MED ORDER — ONDANSETRON HCL 4 MG/2ML IJ SOLN
4.0000 mg | Freq: Once | INTRAMUSCULAR | Status: AC
  Administered 2022-03-30: 4 mg via INTRAVENOUS
  Filled 2022-03-30: qty 2

## 2022-03-30 MED ORDER — HYDROXYUREA 500 MG PO CAPS: 500.0000 mg | ORAL_CAPSULE | Freq: Every day | ORAL | Status: DC

## 2022-03-30 MED ORDER — LEVOTHYROXINE SODIUM 50 MCG PO TABS
50.0000 ug | ORAL_TABLET | Freq: Every day | ORAL | Status: DC
  Administered 2022-03-31 – 2022-04-04 (×5): 50 ug via ORAL
  Filled 2022-03-30 (×5): qty 1

## 2022-03-30 MED ORDER — RISAQUAD PO CAPS
1.0000 | ORAL_CAPSULE | Freq: Two times a day (BID) | ORAL | Status: DC
  Administered 2022-03-31 – 2022-04-04 (×10): 1 via ORAL
  Filled 2022-03-30 (×11): qty 1

## 2022-03-30 MED ORDER — HYDROMORPHONE HCL 1 MG/ML IJ SOLN
0.5000 mg | INTRAMUSCULAR | Status: DC | PRN
  Administered 2022-03-31 – 2022-04-02 (×4): 0.5 mg via INTRAVENOUS
  Filled 2022-03-30: qty 0.5
  Filled 2022-03-30: qty 1
  Filled 2022-03-30 (×2): qty 0.5

## 2022-03-30 MED ORDER — LATANOPROSTENE BUNOD 0.024 % OP SOLN: 1.0000 [drp] | Freq: Every evening | OPHTHALMIC | Status: DC

## 2022-03-30 MED ORDER — OXYCODONE HCL 5 MG PO TABS
5.0000 mg | ORAL_TABLET | ORAL | Status: AC
  Administered 2022-03-30: 5 mg via ORAL
  Filled 2022-03-30: qty 1

## 2022-03-30 MED ORDER — SODIUM CHLORIDE 0.9 % IV SOLN
INTRAVENOUS | Status: AC
  Administered 2022-03-31: 50 mL/h via INTRAVENOUS

## 2022-03-30 MED ORDER — MOMETASONE FURO-FORMOTEROL FUM 100-5 MCG/ACT IN AERO
2.0000 | INHALATION_SPRAY | Freq: Two times a day (BID) | RESPIRATORY_TRACT | Status: DC
  Administered 2022-03-31 – 2022-04-04 (×8): 2 via RESPIRATORY_TRACT
  Filled 2022-03-30: qty 8.8

## 2022-03-30 MED ORDER — HYDROCODONE-ACETAMINOPHEN 5-325 MG PO TABS
1.0000 | ORAL_TABLET | ORAL | Status: AC
  Administered 2022-03-30: 1 via ORAL
  Filled 2022-03-30: qty 1

## 2022-03-30 MED ORDER — CLINDAMYCIN HCL 300 MG PO CAPS
300.0000 mg | ORAL_CAPSULE | Freq: Three times a day (TID) | ORAL | Status: DC
  Administered 2022-03-31 – 2022-04-01 (×5): 300 mg via ORAL
  Filled 2022-03-30 (×6): qty 1

## 2022-03-30 MED ORDER — SULFAMETHOXAZOLE-TRIMETHOPRIM 800-160 MG PO TABS: 1.0000 | ORAL_TABLET | Freq: Two times a day (BID) | ORAL | Status: DC

## 2022-03-30 MED ORDER — HYDROMORPHONE HCL 1 MG/ML IJ SOLN
0.5000 mg | INTRAMUSCULAR | Status: AC
  Administered 2022-03-30: 0.5 mg via INTRAVENOUS
  Filled 2022-03-30: qty 1

## 2022-03-30 MED ORDER — BRIMONIDINE TARTRATE 0.2 % OP SOLN
1.0000 [drp] | Freq: Two times a day (BID) | OPHTHALMIC | Status: DC
  Administered 2022-03-31 – 2022-04-04 (×9): 1 [drp] via OPHTHALMIC
  Filled 2022-03-30 (×2): qty 5

## 2022-03-30 MED ORDER — GLUCERNA SHAKE PO LIQD
237.0000 mL | Freq: Two times a day (BID) | ORAL | Status: DC
  Administered 2022-03-31 – 2022-04-04 (×7): 237 mL via ORAL

## 2022-03-30 MED ORDER — POLYETHYLENE GLYCOL 3350 17 G PO PACK
17.0000 g | PACK | Freq: Every day | ORAL | Status: DC | PRN
  Administered 2022-04-01: 17 g via ORAL
  Filled 2022-03-30: qty 1

## 2022-03-30 MED ORDER — CALCIUM CARBONATE 1250 (500 CA) MG PO TABS
1250.0000 mg | ORAL_TABLET | Freq: Every day | ORAL | Status: DC
  Administered 2022-03-31 – 2022-04-04 (×5): 1250 mg via ORAL
  Filled 2022-03-30 (×5): qty 1

## 2022-03-30 MED ORDER — LACTATED RINGERS IV BOLUS
1000.0000 mL | Freq: Once | INTRAVENOUS | Status: AC
  Administered 2022-03-30: 1000 mL via INTRAVENOUS

## 2022-03-30 MED ORDER — TETANUS-DIPHTH-ACELL PERTUSSIS 5-2.5-18.5 LF-MCG/0.5 IM SUSY
0.5000 mL | PREFILLED_SYRINGE | Freq: Once | INTRAMUSCULAR | Status: AC
  Administered 2022-03-30: 0.5 mL via INTRAMUSCULAR
  Filled 2022-03-30: qty 0.5

## 2022-03-30 MED ORDER — SODIUM CHLORIDE 0.9 % IV SOLN: INTRAVENOUS | Status: DC

## 2022-03-30 MED ORDER — TAMSULOSIN HCL 0.4 MG PO CAPS
0.4000 mg | ORAL_CAPSULE | Freq: Every day | ORAL | Status: DC
  Administered 2022-03-30 – 2022-04-03 (×5): 0.4 mg via ORAL
  Filled 2022-03-30 (×5): qty 1

## 2022-03-30 MED ORDER — ACETAMINOPHEN 325 MG PO TABS
650.0000 mg | ORAL_TABLET | Freq: Four times a day (QID) | ORAL | Status: DC | PRN
  Administered 2022-03-31 – 2022-04-04 (×4): 650 mg via ORAL
  Filled 2022-03-30 (×4): qty 2

## 2022-03-30 MED ORDER — ADULT MULTIVITAMIN W/MINERALS CH
1.0000 | ORAL_TABLET | Freq: Every day | ORAL | Status: DC
  Administered 2022-03-31 – 2022-04-04 (×5): 1 via ORAL
  Filled 2022-03-30 (×5): qty 1

## 2022-03-30 MED ORDER — OXYCODONE HCL 5 MG PO TABS
5.0000 mg | ORAL_TABLET | Freq: Four times a day (QID) | ORAL | Status: DC | PRN
  Administered 2022-03-30 – 2022-04-04 (×11): 5 mg via ORAL
  Filled 2022-03-30 (×11): qty 1

## 2022-03-30 NOTE — H&P (Addendum)
History and Physical  Chelsea Garcia [id number] DOB: 30-Mar-1933 DOA: 03/30/2022  Referring physician: Dr. Philip Aspen, [place] PCP: Lujean Amel, MD  Outpatient Specialists: Oncology, podiatry, pulmonology, vascular surgery. Patient coming from: Home  Chief Complaint: Fall  HPI: Chelsea Garcia is a 89 y.o. female with medical history significant for essential thrombocytosis JAK2 positive, on Hydrea since 2015 followed by Dr. Burr Medico, medical hematology-oncologist, anemia of chronic disease, chronic venous insufficiency, followed by vascular surgery, history of bronchiectasis followed by pulmonary, B/L lower extremity cellulitis with ongoing treatment bactrim and clindamycin day#3/10, ambulatory dysfunction uses a rollator to ambulate, who presented to [facility] ED after a mechanical fall today.  She was in her usual state of health prior to that.  The patient was walking outside.  She tripped and fell into concrete.  She did not hit her head.  She caught herself with her right arm.  Gross deformity to the right shoulder was noted, as well as swelling from the right shoulder to the mid humerus.  EMS was activated.  The patient was on the ground for 2 hours prior to EMS arrival.  A sling was placed.  Also complains of left knee pain with abrasion to left knee.  Vital signs were stable.  In the ED, imaging revealed right humeral neck fracture.  No other reported fractures seen on imaging.  EDP discussed the case with orthopedic surgery Dr. Marcelino Scot who will see in consultation.  The patient received 1 L IV fluid bolus LR and was started on opioid-based analgesics.  EDP requested admission.  The patient was admitted by [facility], hospitalist service.  ED Course: Tmax 98.  BP 97/50, pulse 76, respiratory 17, saturation 95% on room air.  Lab studies remarkable for BUN 44, creatinine 1.70, GFR 29, with baseline creatinine of 1.2 with GFR 44.  WBC 31.0, hemoglobin 9.3, platelet count 269.  Review of Systems: Review of systems as  noted in the HPI. All other systems reviewed and are negative.   Past Medical History:  Diagnosis Date   Bronchiectasis [facility])    followed by dr Rayann Heman completed pulmonary rehan 06-02-2020   Cellulitis of right foot    recurrent cellulitis last 7 days finishes ciprofloxacin for on 11-26-2020   Colon polyps    Benign. 2 colonoscopies in past. Due now again.   Complication of anesthesia    "got dizzy in 70's"   Diarrhea    resolved now taking imodium ad prn   Dry eye 11/25/2020   both eyes   Endometriosis    required TAH when young   Essential tremor    since 40's   GERD (gastroesophageal reflux disease)    denis   Glaucoma    started in 40's both eyes   Low back pain    since 40's   Migraine    started in 50's last migraine 10 or 20 yrs ago per pt on 11-25-2020   Osteopenia    2014   Seasonal allergies    to Dust, mold and pollen.   Thrombocytosis 09/2013   hx of resolved follows up with dr Truitt Merle oncology   Tick bites    she likes out door activities, 2 this year (2015)   UPJ (ureteropelvic junction) obstruction 11/25/2020   left   Urinary frequency    Wears dentures    upper   Wears glasses    Past Surgical History:  Procedure Laterality Date   CATARACT EXTRACTION Left 02/07/2013   right 2015   colonscopy  10 or 15 yrs ago per pt on 11-25-2020   CYSTOSCOPY W/ URETERAL STENT PLACEMENT Left 11/26/2020   Procedure: CYSTOSCOPY WITH BILATERAL RETROGRADE PYELOGRAM/URETERAL STENT PLACEMENT;  Surgeon: Festus Aloe, MD;  Location: [facility];  Service: Urology;  Laterality: Left;  ONLY NEEDS 30 MIN   CYSTOSCOPY WITH URETEROSCOPY AND STENT PLACEMENT Left 01/12/2021   Procedure: CYSTOSCOPY WITH URETEROSCOPY AND STENT EXCHANGE;  Surgeon: Festus Aloe, MD;  Location: [facility];  Service: Urology;  Laterality: Left;   INGUINAL HERNIA REPAIR Left 01/09/2020   Procedure: LEFT INGUINAL HERNIA REPAIR WITH MESH;  Surgeon: Erroll Luna, MD;  Location: [place];  Service: General;  Laterality: Left;   INSERTION OF MESH Left 01/09/2020   Procedure: INSERTION OF MESH;  Surgeon: Erroll Luna, MD;  Location: [place];  Service: General;  Laterality: Left;   TOTAL ABDOMINAL HYSTERECTOMY  09/10/1968   TUBAL LIGATION     yrs ago    Social History:  reports that she quit smoking about 60 years ago. Her smoking use included cigarettes. She has a 8.00 pack-year smoking history. She has never used smokeless tobacco. She reports that she does not currently use alcohol. She reports that she does not use drugs.   Allergies  Allergen Reactions   Nystatin     Swelling of lips   Bee Pollen Other (See Comments) and Cough    Mill dust, pollen. Mill dust, pollen.   Pollen Extract Cough    Mill dust, pollen.   Albuterol     Increased HR, had to go to the ER   Doxycycline Nausea And Vomiting   Mangifera Indica Fruit Ext (Mango) [Mangifera Indica] Diarrhea   Mite (D. Farinae) Other (See Comments)    General allergy   Sulfamethoxazole Other (See Comments)    Irritation in eyes  Other Other    Family History  Problem Relation Age of Onset   Thyroid disease Mother    Cancer Father    Cancer Maternal Aunt 80      Prior to Admission medications   Medication Sig Start Date End Date Taking? Authorizing Provider  acetaminophen (TYLENOL) 500 MG tablet Take 500 mg by mouth every 6 (six) hours as needed for moderate pain.    [provider]  aspirin EC 81 MG tablet Take 81 mg by mouth daily. Swallow whole.    [provider]  brimonidine (ALPHAGAN) 0.2 % ophthalmic solution Place 1 drop into both eyes 2 (two) times daily. 08/17/20   [provider]  calcium carbonate (OS-CAL) 1250 (500 Ca) MG chewable tablet Chew 1 tablet by mouth daily.    [provider]  fluticasone-salmeterol (ADVAIR DISKUS) 100-50 MCG/ACT AEPB Inhale 1 puff into the lungs 2 (two) times  daily. 02/15/22   Margaretha Seeds, MD  Latanoprostene Bunod (VYZULTA) 0.024 % SOLN Apply 1 drop to eye every evening. Both eyes    [provider]  levothyroxine (SYNTHROID) 50 MCG tablet Take 50 mcg by mouth every morning.    [provider]  loperamide (IMODIUM A-D) 2 MG tablet Take 2 mg by mouth 4 (four) times daily as needed for diarrhea or loose stools.    [provider]  Multiple Vitamin (MULTI VITAMIN) TABS Take 2 tablets by mouth daily.    [provider]  Netarsudil Dimesylate (RHOPRESSA) 0.02 % SOLN Apply 1 drop to eye every evening. Both eyes    [provider]  Polyethyl Glycol-Propyl Glycol (SYSTANE  OP) Apply to eye. Uses eye drops prn for dry eye    [provider]  tamsulosin (FLOMAX) 0.4 MG CAPS capsule Take 0.4 mg by mouth at bedtime. 02/08/22   [provider]    Physical Exam: BP (!) 95/49 (BP Location: Right Arm)   Pulse 75   Temp 98 F (36.7 C) (Oral)   Resp 17   Ht 5' (1.524 m)   Wt 49.9 kg   SpO2 97%   BMI 21.48 kg/m   General: 89 y.o. year-old female well developed well nourished in no acute distress.  Alert and oriented x3. Cardiovascular: Regular rate and rhythm with no rubs or gallops.  No thyromegaly or JVD noted.  Trace lower extremity edema bilaterally. 2/4 pulses in all 4 extremities. Respiratory: Clear to auscultation with no wheezes or rales. Good inspiratory effort. Abdomen: Soft nontender nondistended with normal bowel sounds x4 quadrants. Muskuloskeletal: No cyanosis or clubbing noted bilaterally.  Deformity noted to right shoulder. Neuro: CN II-XII intact, strength, sensation, reflexes Skin: Erythema involving both LE with warmth and tenderness. Psychiatry: Judgement and insight appear normal. Mood is appropriate for condition and setting          Labs on Admission:  Basic Metabolic Panel: Recent Labs  Lab 03/30/22 2004  NA 137  K 4.9  CL 105  CO2 24  GLUCOSE 106*  BUN 44*   CREATININE 1.70*  CALCIUM 8.7*   Liver Function Tests: No results for input(s): "AST", "ALT", "ALKPHOS", "BILITOT", "PROT", "ALBUMIN" in the last 168 hours. No results for input(s): "LIPASE", "AMYLASE" in the last 168 hours. No results for input(s): "AMMONIA" in the last 168 hours. CBC: Recent Labs  Lab 03/30/22 2004  WBC 31.0*  HGB 9.3*  HCT 30.9*  MCV 97.2  PLT 269   Cardiac Enzymes: No results for input(s): "CKTOTAL", "CKMB", "CKMBINDEX", "TROPONINI" in the last 168 hours.  BNP (last 3 results) No results for input(s): "BNP" in the last 8760 hours.  ProBNP (last 3 results) No results for input(s): "PROBNP" in the last 8760 hours.  CBG: No results for input(s): "GLUCAP" in the last 168 hours.  Radiological Exams on Admission: DG Knee Complete 4 Views Left  Result Date: 03/30/2022 CLINICAL DATA:  Fall EXAM: LEFT KNEE - COMPLETE 4+ VIEW COMPARISON:  None Available. FINDINGS: Chondrocalcinosis. Joint space narrowing in the medial and lateral compartments. No joint effusion. No acute bony abnormality. Specifically, no fracture, subluxation, or dislocation. IMPRESSION: Chondrocalcinosis with early degenerative changes. No acute bony abnormality. Electronically Signed   By: Rolm Baptise M.D.   On: 03/30/2022 20:15   DG Humerus Right  Result Date: 03/30/2022 CLINICAL DATA:  Fall, deformity right proximal humerus EXAM: RIGHT HUMERUS - 2+ VIEW COMPARISON:  Shoulder series today FINDINGS: There is an angulated fracture through the right humeral neck. No subluxation or dislocation. AC joint intact. IMPRESSION: Angulated right humeral neck fracture. Electronically Signed   By: Rolm Baptise M.D.   On: 03/30/2022 20:14   DG Shoulder Right  Result Date: 03/30/2022 CLINICAL DATA:  Fall, upper arm deformity. EXAM: RIGHT SHOULDER - 2+ VIEW COMPARISON:  Humerus series today FINDINGS: There is an angulated humeral neck fracture. No subluxation or dislocation. AC joint intact. IMPRESSION:  Angulated right humeral neck fracture Electronically Signed   By: Rolm Baptise M.D.   On: 03/30/2022 20:13    EKG: I independently viewed the EKG done and my findings are as followed: None available at the time of this visit.  Assessment/Plan Present  on Admission:  Humeral surgical neck fracture  Principal Problem:   Humeral surgical neck fracture  Angulated right humeral neck fracture, post mechanical fall. Orthopedic surgery, Dr. Marcelino Scot consulted by EDP N.p.o. after midnight Continue gentle IV fluid hydration, continue analgesics PRN  Essential thrombocytosis with JAK2 positive Presented with WBC 31,000. Follows with Dr. Annamaria Boots Resume home Hydrea The patient is also on aspirin, hold off aspirin due to possible orthopedic surgery Defer pharmacological DVT prophylaxis to orthopedic surgery  Anemia of chronic disease Presented with hemoglobin of 9.3 at baseline hemoglobin of 10. Closely monitor H&H Transfuse hemoglobin less than 7.  Hypothyroidism Resume home levothyroxine  History of bronchiectasis No acute issues Resume home bronchodilators  B/L lower extremity cellulitis diagnosed outpatient on 03/28/22 Resume home regimen PTA on clindamycin and Bactrim Add florastor, high risk for c-diff with clindamycin.  Ambulatory dysfunction Ambulates with a rollator at baseline PT OT assessment Fall precautions   DVT prophylaxis: SCDs.  Code Status: Full code per the patient.  Family Communication: Updated the patient's daughter-in-law, who is also medical POA, at bedside.  Disposition Plan: Admitted to telemetry surgical unit  Consults called: Orthopedic surgery  Admission status: Inpatient status   Status is: Inpatient The patient requires at least 2 midnights for further evaluation and treatment of present condition.   Kayleen Memos MD [facility] Pager [phone]  If 7PM-7AM, please contact night-coverage [email or web address] Password [facility]  03/30/2022,  10:23 PM

## 2022-03-30 NOTE — ED Provider Notes (Signed)
Chelsea Garcia   CSN: [id number] Arrival date & time: 03/30/22  1800     History  Chief Complaint  Patient presents with   Chelsea Garcia    Chelsea Garcia is a 89 y.o. female.  89 year old female with a history of bronchiectasis, essential tremor, and thrombocytosis who presents emergency department after a fall.  Patient reports that she was walking outside in her garden without her walker which she typically uses and fell.  Says that she struck her right shoulder which appears to have a deformity.  Also struck her left knee and has been having pain in these 2 locations since.  Denies any prolonged downtime.  No head strike or LOC.  No preceding symptoms.  Is on baby aspirin but no other anticoagulation.  No neck pain or pain elsewhere after the falls.        Home Medications Prior to Admission medications   Medication Sig Start Date End Date Taking? Authorizing Provider  acetaminophen (TYLENOL) 500 MG tablet Take 500 mg by mouth every 6 (six) hours as needed for moderate pain.   Yes [provider]  aspirin EC 81 MG tablet Take 81 mg by mouth daily. Swallow whole.   Yes [provider]  brimonidine (ALPHAGAN) 0.2 % ophthalmic solution Place 1 drop into both eyes 2 (two) times daily. 08/17/20  Yes [provider]  calcium carbonate (OS-CAL) 1250 (500 Ca) MG chewable tablet Chew 1 tablet by mouth daily.   Yes [provider]  clindamycin (CLEOCIN) 300 MG capsule Take 300 mg by mouth every 8 (eight) hours. 03/23/22  Yes [provider]  feeding supplement, GLUCERNA SHAKE, (GLUCERNA SHAKE) LIQD Take 237 mLs by mouth 2 (two) times daily between meals.   Yes [provider]  fluticasone-salmeterol (ADVAIR DISKUS) 100-50 MCG/ACT AEPB Inhale 1 puff into the lungs 2 (two) times daily. 02/15/22  Yes Chelsea Seeds, MD  Latanoprostene Bunod (VYZULTA) 0.024 % SOLN Apply 1 drop to eye every evening.  Both eyes   Yes [provider]  levothyroxine (SYNTHROID) 50 MCG tablet Take 50 mcg by mouth every morning.   Yes [provider]  loperamide (IMODIUM A-D) 2 MG tablet Take 2 mg by mouth 4 (four) times daily as needed for diarrhea or loose stools.   Yes [provider]  Multiple Vitamin (MULTI VITAMIN) TABS Take 2 tablets by mouth daily.   Yes [provider]  Netarsudil Dimesylate (RHOPRESSA) 0.02 % SOLN Apply 1 drop to eye every evening. Both eyes   Yes [provider]  Polyethyl Glycol-Propyl Glycol (SYSTANE OP) Place 1 drop into both eyes daily as needed (dry eyes). Uses eye drops prn for dry eye   Yes [provider]  sulfamethoxazole-trimethoprim (BACTRIM DS) 800-160 MG tablet Take 1 tablet by mouth 2 (two) times daily. 03/28/22  Yes [provider]  tamsulosin (FLOMAX) 0.4 MG CAPS capsule Take 0.4 mg by mouth at bedtime. 02/08/22  Yes [provider]      Allergies    Nystatin, Bee pollen, Pollen extract, Albuterol, Doxycycline, Mangifera indica fruit ext (mango) [mangifera indica], Mite (d. farinae), and Sulfamethoxazole    Review of Systems   Review of Systems  Physical Exam Updated Vital Signs BP (!) 97/59 (BP Location: Left Arm)   Pulse 83   Temp 98.5 F (36.9 C) (Oral)   Resp 18   Ht 5' (1.524 m)   Wt 49.9 kg   SpO2 95%  BMI 21.48 kg/m  Physical Exam Constitutional:      General: She is not in acute distress.    Appearance: Normal appearance. She is not ill-appearing.  HENT:     Head: Normocephalic and atraumatic.     Right Ear: External ear normal.     Left Ear: External ear normal.     Mouth/Throat:     Mouth: Mucous membranes are moist.     Pharynx: Oropharynx is clear.  Eyes:     Extraocular Movements: Extraocular movements intact.     Conjunctiva/sclera: Conjunctivae normal.     Pupils: Pupils are equal, round, and reactive to light.  Neck:     Comments: No C-spine midline tenderness  to palpation Cardiovascular:     Rate and Rhythm: Normal rate and regular rhythm.     Pulses: Normal pulses.     Heart sounds: Normal heart sounds.  Pulmonary:     Effort: Pulmonary effort is normal. No respiratory distress.     Breath sounds: Normal breath sounds.  Abdominal:     General: Abdomen is flat. Bowel sounds are normal.     Palpations: Abdomen is soft.     Tenderness: There is no abdominal tenderness. There is no guarding.  Musculoskeletal:        General: No deformity. Normal range of motion.     Cervical back: No rigidity or tenderness.     Comments: No tenderness to palpation of midline thoracic or lumbar spine.  No step-offs palpated.  No tenderness to palpation of chest wall.  No bruising noted.  No tenderness to palpation of bilateral clavicles.  No tenderness to palpation, bruising, or deformities noted of bilateral elbows, wrists, hips, or ankles.  Tenderness palpation of right shoulder with obvious deformity.  Abrasion of the left knee.  Symmetrically palpable radial and ulnar pulses. Capillary refill <2 seconds to all digits.  Intact sensation to light touch of the radial, median and ulnar nerves demonstrated by testing in the dorsal web space of the thumb, the hypothenar eminence of the palm, and the radial aspect of the dorsum of the hand.  Intact motor function of the radial, median and ulnar nerves demonstrated by strength of hand grip, and spreading of the 2nd through 5th digits, thumb apposition, and ability to make "OK sign".  Neurological:     General: No focal deficit present.     Mental Status: She is alert and oriented to person, place, and time. Mental status is at baseline.     Cranial Nerves: No cranial nerve deficit.     Sensory: No sensory deficit.     Motor: No weakness.     ED Results / Procedures / Treatments   Labs (all labs ordered are listed, but only abnormal results are displayed) Labs Reviewed  CBC - Abnormal; Notable for the  following components:      Result Value   WBC 31.0 (*)    RBC 3.18 (*)    Hemoglobin 9.3 (*)    HCT 30.9 (*)    RDW 15.9 (*)    All other components within normal limits  BASIC METABOLIC PANEL - Abnormal; Notable for the following components:   Glucose, Bld 106 (*)    BUN 44 (*)    Creatinine, Ser 1.70 (*)    Calcium 8.7 (*)    GFR, Estimated 29 (*)    All other components within normal limits  CBC - Abnormal; Notable for the following components:   WBC 20.2 (*)  RBC 2.65 (*)    Hemoglobin 7.9 (*)    HCT 26.0 (*)    RDW 16.1 (*)    All other components within normal limits  BASIC METABOLIC PANEL - Abnormal; Notable for the following components:   CO2 18 (*)    Glucose, Bld 100 (*)    BUN 40 (*)    Creatinine, Ser 1.48 (*)    Calcium 8.0 (*)    GFR, Estimated 34 (*)    All other components within normal limits  MAGNESIUM  PHOSPHORUS  TSH  COMPREHENSIVE METABOLIC PANEL  CBC    EKG None  Radiology CT 3D RECON AT SCANNER  Result Date: 03/31/2022 CLINICAL DATA:  Nonspecific (abnormal) findings on radiological and other examination of musculoskeletal system right shoulder fracture. EXAM: 3-DIMENSIONAL CT IMAGE RENDERING ON ACQUISITION WORKSTATION TECHNIQUE: 3-dimensional CT images were rendered by post-processing of the original CT data on an acquisition workstation. The 3-dimensional CT images were interpreted and findings were reported in the accompanying complete CT report for this study COMPARISON:  Source CT images contemporaneously done. FINDINGS: Again noted is a transverse surgical neck fracture of the proximal right humerus with impaction of proximal fragment into the humeral head, lateral translation of to 1 cm and slight varus angulation. No other acute abnormality is seen. IMPRESSION: Impacted and slightly angulated surgical neck fracture of the proximal right humerus, rendered in rotational 3D reformatting. Electronically Signed   By: Chelsea Garcia M.D.   On:  03/31/2022 06:30   CT SHOULDER RIGHT WO CONTRAST  Result Date: 03/30/2022 CLINICAL DATA:  Right shoulder trauma. EXAM: CT OF THE UPPER RIGHT EXTREMITY WITHOUT CONTRAST TECHNIQUE: Multidetector CT imaging of the upper right extremity was performed according to the standard protocol. RADIATION DOSE REDUCTION: This exam was performed according to the departmental dose-optimization program which includes automated exposure control, adjustment of the mA and/or kV according to patient size and/or use of iterative reconstruction technique. COMPARISON:  Right shoulder series today. FINDINGS: Bones/Joint/Cartilage There is a transverse surgical neck fracture of the humerus with impaction of the proximal fragment into the humeral head up to 2 cm, lateral translation of the main distal fragment up to 1 cm, and a slight distal fragment varus angulation. There are small comminution fragments scattered around the anterior and posterior fracture margins. There is a mild glenohumeral joint effusion or hemarthrosis. There is no humeral head dislocation or AC joint dislocation. There is trace spurring at the [facility] joint and of the inferomedial humeral head. Labral chondrocalcinosis is also seen. There is slight glenohumeral joint space loss. No scapular fracture or glenoid spurring is seen. The clavicle and visualized right upper ribs are intact. Ligaments Suboptimally assessed by CT. Muscles and Tendons The rotator cuff muscles are normal in volume. There appears to be at least mild edema in the deltoid muscle. There are calcifications in the supraspinatus and infraspinatus tendons consistent with calcific tendinitis or chronic calcific tendinopathy. There is narrowing of the acromiohumeral space at the level of the conjoined supraspinatus/infraspinatus tendon complex. At least a partial tendon tear could be present in this location. The bicipital tendon is not well seen and cannot be confirmed intact or normally located. The teres  minor and subscapularis tendons are also not well seen but at least the latter is probably intact. Soft tissues There is mild generalized edema in the visualized portion of the upper arm. Scattered linear scarring changes in the visualized right upper lobe. There is posterior atelectasis in the right middle and lower  lobes. No pneumothorax or acute process in the visualized right lung. Minimal right pleural effusion incidentally noted. IMPRESSION: 1. Transverse surgical neck fracture of the humerus with impaction of the proximal fragment into the humeral head up to 2 cm, lateral translation of the main distal fragment up to 1 cm, and a slight distal-fragment varus angulation. 2. No humeral head or AC joint dislocation. 3. Mild glenohumeral and AC joint degenerative change with labral chondrocalcinosis. 4. Supraspinatus and infraspinatus calcific tendinitis or chronic calcific tendinopathy. 5. Narrowing of the acromiohumeral space at the level of the conjoined supraspinatus/infraspinatus tendon complex. At least a partial tendon tear could be present in this location. 6. Poor visualization of the subscapularis, bicipital and teres minor tendons which cannot be confirmed intact. 7. Generalized mild edema in the visualized portion of the upper arm. 8. Minimal right pleural effusion. Electronically Signed   By: Chelsea Garcia M.D.   On: 03/30/2022 23:17   DG Knee Complete 4 Views Left  Result Date: 03/30/2022 CLINICAL DATA:  Fall EXAM: LEFT KNEE - COMPLETE 4+ VIEW COMPARISON:  None Available. FINDINGS: Chondrocalcinosis. Joint space narrowing in the medial and lateral compartments. No joint effusion. No acute bony abnormality. Specifically, no fracture, subluxation, or dislocation. IMPRESSION: Chondrocalcinosis with early degenerative changes. No acute bony abnormality. Electronically Signed   By: Chelsea Garcia M.D.   On: 03/30/2022 20:15   DG Humerus Right  Result Date: 03/30/2022 CLINICAL DATA:  Fall, deformity  right proximal humerus EXAM: RIGHT HUMERUS - 2+ VIEW COMPARISON:  Shoulder series today FINDINGS: There is an angulated fracture through the right humeral neck. No subluxation or dislocation. AC joint intact. IMPRESSION: Angulated right humeral neck fracture. Electronically Signed   By: Chelsea Garcia M.D.   On: 03/30/2022 20:14   DG Shoulder Right  Result Date: 03/30/2022 CLINICAL DATA:  Fall, upper arm deformity. EXAM: RIGHT SHOULDER - 2+ VIEW COMPARISON:  Humerus series today FINDINGS: There is an angulated humeral neck fracture. No subluxation or dislocation. AC joint intact. IMPRESSION: Angulated right humeral neck fracture Electronically Signed   By: Chelsea Garcia M.D.   On: 03/30/2022 20:13    Procedures Procedures   Medications Ordered in ED Medications  levothyroxine (SYNTHROID) tablet 50 mcg (50 mcg Oral Given 03/31/22 0623)  tamsulosin (FLOMAX) capsule 0.4 mg (0.4 mg Oral Given 03/31/22 2206)  multivitamin with minerals tablet 1 tablet (1 tablet Oral Given 03/31/22 1010)  calcium carbonate (OS-CAL - dosed in mg of elemental calcium) tablet 1,250 mg (1,250 mg Oral Given 03/31/22 1010)  mometasone-formoterol (DULERA) 100-5 MCG/ACT inhaler 2 puff (2 puffs Inhalation Given 03/31/22 2214)  oxyCODONE (Oxy IR/ROXICODONE) immediate release tablet 5 mg (5 mg Oral Given 03/31/22 2206)  HYDROmorphone (DILAUDID) injection 0.5 mg (0.5 mg Intravenous Given 03/31/22 0303)  acetaminophen (TYLENOL) tablet 650 mg (650 mg Oral Given 03/31/22 1009)  polyethylene glycol (MIRALAX / GLYCOLAX) packet 17 g (has no administration in time range)  prochlorperazine (COMPAZINE) injection 5 mg (has no administration in time range)  acidophilus (RISAQUAD) capsule 1 capsule (1 capsule Oral Given 03/31/22 2206)  clindamycin (CLEOCIN) capsule 300 mg (300 mg Oral Given 03/31/22 2222)  brimonidine (ALPHAGAN) 0.2 % ophthalmic solution 1 drop (1 drop Both Eyes Given 03/31/22 2227)  feeding supplement (GLUCERNA SHAKE) (GLUCERNA SHAKE)  liquid 237 mL (237 mLs Oral Given 03/31/22 1607)  polyvinyl alcohol (LIQUIFILM TEARS) 1.4 % ophthalmic solution 1 drop (has no administration in time range)  0.9 %  sodium chloride infusion (0 mLs Intravenous Stopped 03/31/22 1009)  sulfamethoxazole-trimethoprim (BACTRIM) 400-80 MG per tablet 1 tablet (1 tablet Oral Given 03/31/22 2206)  latanoprost (XALATAN) 0.005 % ophthalmic solution 1 drop (1 drop Both Eyes Given 03/31/22 2228)  Oral care mouth rinse (has no administration in time range)  aspirin EC tablet 81 mg (81 mg Oral Given 03/31/22 2226)  Tdap (BOOSTRIX) injection 0.5 mL (0.5 mLs Intramuscular Given 03/30/22 1846)  HYDROcodone-acetaminophen (NORCO/VICODIN) 5-325 MG per tablet 1 tablet (1 tablet Oral Given 03/30/22 1846)  morphine (PF) 2 MG/ML injection 2 mg (2 mg Intravenous Given 03/30/22 2055)  lactated ringers bolus 1,000 mL (0 mLs Intravenous Stopped 03/31/22 0002)  oxyCODONE (Oxy IR/ROXICODONE) immediate release tablet 5 mg (5 mg Oral Given 03/30/22 2201)  ondansetron (ZOFRAN) injection 4 mg (4 mg Intravenous Given 03/30/22 2201)  HYDROmorphone (DILAUDID) injection 0.5 mg (0.5 mg Intravenous Given 03/30/22 2350)    ED Course/ Medical Decision Making/ A&P Clinical Course as of 04/01/22 0019  Wed Mar 30, 2022  2009 Chelsea Garcia from orthopedics aware.  Will see the patient when admitted. [RP]  2217 Chelsea Garcia from hospitalist to admit patient.  [RP]    Clinical Course User Index [RP] Chelsea Meadow, MD                             Medical Decision Making Amount and/or Complexity of Data Reviewed Labs: ordered. Radiology: ordered.  Risk Prescription drug management. Decision regarding hospitalization.   Tailynn Syverson is a 89 y.o. female with comorbidities that complicate the patient evaluation including bronchiectasis, essential tremor, and thrombocytosis who presents emergency department after a fall with R shoulder deformity and L knee pain  Initial Ddx:  Shoulder dislocation,  fracture, knee injury  MDM:  Will obtain x-rays of patient's shoulder and knee to rule out fracture or dislocation.  Will send labs as well since I suspect the patient will require admission since she walks with a walker and will be unable to with her shoulder injury.  Plan:  Labs X-ray shoulder and knee   ED Summary/Re-evaluation:  Workup which did show angulated proximal humerus fracture.  No knee injuries were noted.  Patient was found to have AKI.  She was admitted to medicine for further management. Ortho notified and will see the patient in the morning.   This patient presents to the ED for concern of complaints listed in HPI, this involves an extensive number of treatment options, and is a complaint that carries with it a high risk of complications and morbidity. Disposition including potential need for admission considered.   Dispo: Admit to Floor  Additional history obtained from family Records reviewed Outpatient Clinic Notes The following labs were independently interpreted: Chemistry and show AKI I independently reviewed the following imaging with scope of interpretation limited to determining acute life threatening conditions related to emergency care: Extremity x-ray(s) and agree with the radiologist interpretation with the following exceptions: none I personally reviewed and interpreted the pt's EKG: see above for interpretation  I have reviewed the patients home medications and made adjustments as needed Consults: Hospitalist and Orthopedics Social Determinants of health:  Elderly  Final Clinical Impression(s) / ED Diagnoses Final diagnoses:  Fall, initial encounter  Other closed displaced fracture of proximal end of right humerus, initial encounter  AKI (acute kidney injury) ([place])    Rx / [place] Orders ED Discharge Orders     None         Chelsea Meadow, MD 04/01/22 [phone]

## 2022-03-30 NOTE — ED Triage Notes (Addendum)
Pt bib ems from home; pt a and o x 4; walking outside , tripped fell into concrete; did not hit head; caught self with R arm; deformity to R shoulder; swelling R shoulder to mid humerus; ; pain to mid r humerus; strong bilateral radial pulses; sling in place; no meds given pta; pt endorses hx of facial swelling after being given pain meds but is unsure of which one; ; also  c/o L knee pain, abrasion to L knee but no other obvious injury; no neck or back pain; stomach distended, baseline, hx scoliosis; lower leg cellulitis currently being treated; on ground for 2 hours before ems got there; 124/82, 92 hr, 94% RA cbg 146

## 2022-03-30 NOTE — ED Notes (Signed)
ED TO INPATIENT HANDOFF REPORT  ED Nurse Name and Phone #: Randall Hiss [phone]  S Name/Age/Gender Chelsea Garcia 89 y.o. female Room/Bed: 008C/008C  Code Status   Code Status: Full Code  Home/SNF/Other Home Patient oriented to: self Is this baseline? Yes   Triage Complete: Triage complete  Chief Complaint Humeral surgical neck fracture [S42.213A]  Triage Note Pt bib ems from home; pt a and o x 4; walking outside , tripped fell into concrete; did not hit head; caught self with R arm; deformity to R shoulder; swelling R shoulder to mid humerus; ; pain to mid r humerus; strong bilateral radial pulses; sling in place; no meds given pta; pt endorses hx of facial swelling after being given pain meds but is unsure of which one; ; also  c/o L knee pain, abrasion to L knee but no other obvious injury; no neck or back pain; stomach distended, baseline, hx scoliosis; lower leg cellulitis currently being treated; on ground for 2 hours before ems got there; 124/82, 92 hr, 94% RA cbg 146   Allergies Allergies  Allergen Reactions   Nystatin     Swelling of lips   Bee Pollen Other (See Comments) and Cough    Mill dust, pollen. Mill dust, pollen.   Pollen Extract Cough    Mill dust, pollen.   Albuterol     Increased HR, had to go to the ER   Doxycycline Nausea And Vomiting   Mangifera Indica Fruit Ext (Mango) [Mangifera Indica] Diarrhea   Mite (D. Farinae) Other (See Comments)    General allergy   Sulfamethoxazole Other (See Comments)    Irritation in eyes  Other Other    Level of Care/Admitting Diagnosis ED Disposition     ED Disposition  Admit   Condition  --   [place]: [facility] [100100]  Level of Care: Telemetry Surgical [105]  May admit patient to [facility] or [facility] if equivalent level of care is available:: Yes  Covid Evaluation: Asymptomatic - no recent exposure (last 10 days) testing not required  Diagnosis: Humeral surgical neck fracture  [id number]  Admitting Physician: Kayleen Memos [id number]  Attending Physician: Kayleen Memos [id number]  Certification:: I certify this patient will need inpatient services for at least 2 midnights  Estimated Length of Stay: 2          B Medical/Surgery History Past Medical History:  Diagnosis Date   Bronchiectasis ([place])    followed by dr Rayann Heman completed pulmonary rehan 06-02-2020   Cellulitis of right foot    recurrent cellulitis last 7 days finishes ciprofloxacin for on 11-26-2020   Colon polyps    Benign. 2 colonoscopies in past. Due now again.   Complication of anesthesia    "got dizzy in 70's"   Diarrhea    resolved now taking imodium ad prn   Dry eye 11/25/2020   both eyes   Endometriosis    required TAH when young   Essential tremor    since 40's   GERD (gastroesophageal reflux disease)    denis   Glaucoma    started in 40's both eyes   Low back pain    since 40's   Migraine    started in 50's last migraine 10 or 20 yrs ago per pt on 11-25-2020   Osteopenia    2014   Seasonal allergies    to Dust, mold and pollen.   Thrombocytosis 09/2013   hx of resolved follows  up with dr Truitt Merle oncology   Tick bites    she likes out door activities, 2 this year (2015)   UPJ (ureteropelvic junction) obstruction 11/25/2020   left   Urinary frequency    Wears dentures    upper   Wears glasses    Past Surgical History:  Procedure Laterality Date   CATARACT EXTRACTION Left 02/07/2013   right 2015   colonscopy     10 or 15 yrs ago per pt on 11-25-2020   CYSTOSCOPY W/ URETERAL STENT PLACEMENT Left 11/26/2020   Procedure: CYSTOSCOPY WITH BILATERAL RETROGRADE PYELOGRAM/URETERAL STENT PLACEMENT;  Surgeon: Festus Aloe, MD;  Location: [facility];  Service: Urology;  Laterality: Left;  ONLY NEEDS 30 MIN   CYSTOSCOPY WITH URETEROSCOPY AND STENT PLACEMENT Left 01/12/2021   Procedure: CYSTOSCOPY WITH URETEROSCOPY AND STENT EXCHANGE;  Surgeon:  Festus Aloe, MD;  Location: [facility];  Service: Urology;  Laterality: Left;   INGUINAL HERNIA REPAIR Left 01/09/2020   Procedure: LEFT INGUINAL HERNIA REPAIR WITH MESH;  Surgeon: Erroll Luna, MD;  Location: [place];  Service: General;  Laterality: Left;   INSERTION OF MESH Left 01/09/2020   Procedure: INSERTION OF MESH;  Surgeon: Erroll Luna, MD;  Location: [place];  Service: General;  Laterality: Left;   TOTAL ABDOMINAL HYSTERECTOMY  09/10/1968   TUBAL LIGATION     yrs ago     A IV Location/Drains/Wounds Patient Lines/Drains/Airways Status     Active Line/Drains/Airways     Name Placement date Placement time Site Days   Peripheral IV 03/30/22 20 G Anterior;Left;Proximal Forearm 03/30/22  1930  Forearm  less than 1   Ureteral Drain/Stent Left ureter 6 Fr. 01/12/21  1236  Left ureter  442   External Urinary Catheter 03/30/22  1825  --  less than 1            Intake/Output Last 24 hours No intake or output data in the 24 hours ending 03/30/22 2244  Labs/Imaging Results for orders placed or performed during the hospital encounter of 03/30/22 (from the past 48 hour(s))  CBC     Status: Abnormal   Collection Time: 03/30/22  8:04 PM  Result Value Ref Range   WBC 31.0 (H) 4.0 - 10.5 K/uL   RBC 3.18 (L) 3.87 - 5.11 MIL/uL   Hemoglobin 9.3 (L) 12.0 - 15.0 g/dL   HCT 30.9 (L) 36.0 - 46.0 %   MCV 97.2 80.0 - 100.0 fL   MCH 29.2 26.0 - 34.0 pg   MCHC 30.1 30.0 - 36.0 g/dL   RDW 15.9 (H) 11.5 - 15.5 %   Platelets 269 150 - 400 K/uL   nRBC 0.1 0.0 - 0.2 %    Comment: Performed at [facility], [address] [place], [place] [id number]  Basic metabolic panel     Status: Abnormal   Collection Time: 03/30/22  8:04 PM  Result Value Ref Range   Sodium 137 135 - 145 mmol/L   Potassium 4.9 3.5 - 5.1 mmol/L   Chloride 105 98 - 111 mmol/L   CO2 24 22 - 32 mmol/L   Glucose, Bld 106 (H) 70 - 99 mg/dL    Comment:  Glucose reference range applies only to samples taken after fasting for at least 8 hours.   BUN 44 (H) 8 - 23 mg/dL   Creatinine, Ser 1.70 (H) 0.44 - 1.00 mg/dL   Calcium 8.7 (L) 8.9 - 10.3 mg/dL  GFR, Estimated 29 (L) >60 mL/min    Comment: (NOTE) Calculated using the CKD-EPI Creatinine Equation (2021)    Anion gap 8 5 - 15    Comment: Performed at [facility], [address] [place], [place] [address]   DG Knee Complete 4 Views Left  Result Date: 03/30/2022 CLINICAL DATA:  Fall EXAM: LEFT KNEE - COMPLETE 4+ VIEW COMPARISON:  None Available. FINDINGS: Chondrocalcinosis. Joint space narrowing in the medial and lateral compartments. No joint effusion. No acute bony abnormality. Specifically, no fracture, subluxation, or dislocation. IMPRESSION: Chondrocalcinosis with early degenerative changes. No acute bony abnormality. Electronically Signed   By: Rolm Baptise M.D.   On: 03/30/2022 20:15   DG Humerus Right  Result Date: 03/30/2022 CLINICAL DATA:  Fall, deformity right proximal humerus EXAM: RIGHT HUMERUS - 2+ VIEW COMPARISON:  Shoulder series today FINDINGS: There is an angulated fracture through the right humeral neck. No subluxation or dislocation. AC joint intact. IMPRESSION: Angulated right humeral neck fracture. Electronically Signed   By: Rolm Baptise M.D.   On: 03/30/2022 20:14   DG Shoulder Right  Result Date: 03/30/2022 CLINICAL DATA:  Fall, upper arm deformity. EXAM: RIGHT SHOULDER - 2+ VIEW COMPARISON:  Humerus series today FINDINGS: There is an angulated humeral neck fracture. No subluxation or dislocation. AC joint intact. IMPRESSION: Angulated right humeral neck fracture Electronically Signed   By: Rolm Baptise M.D.   On: 03/30/2022 20:13    Pending Labs Unresulted Labs (From admission, onward)     Start     Ordered   03/31/22 0500  CBC  Tomorrow morning,   R        03/30/22 2238   03/31/22 [id number]  Basic metabolic panel  Tomorrow morning,   R        03/30/22 2238    03/31/22 0500  Magnesium  Tomorrow morning,   R        03/30/22 2238   03/31/22 0500  Phosphorus  Tomorrow morning,   R        03/30/22 2238            Vitals/Pain Today's Vitals   03/30/22 2100 03/30/22 2125 03/30/22 2131 03/30/22 2243  BP: (!) 94/47  (!) 95/49   Pulse: 75  75   Resp:   17   Temp:   98 F (36.7 C)   TempSrc:   Oral   SpO2: 94%  97%   Weight:      Height:      PainSc:  6  8  7      Isolation Precautions No active isolations  Medications Medications  levothyroxine (SYNTHROID) tablet 50 mcg (has no administration in time range)  tamsulosin (FLOMAX) capsule 0.4 mg (has no administration in time range)  multivitamin with minerals tablet 1 tablet (has no administration in time range)  Netarsudil Dimesylate 0.02 % SOLN 1 drop (has no administration in time range)  calcium carbonate (OS-CAL - dosed in mg of elemental calcium) tablet 1,250 mg (has no administration in time range)  mometasone-formoterol (DULERA) 100-5 MCG/ACT inhaler 2 puff (has no administration in time range)  0.9 %  sodium chloride infusion (has no administration in time range)  Tdap (BOOSTRIX) injection 0.5 mL (0.5 mLs Intramuscular Given 03/30/22 1846)  HYDROcodone-acetaminophen (NORCO/VICODIN) 5-325 MG per tablet 1 tablet (1 tablet Oral Given 03/30/22 1846)  morphine (PF) 2 MG/ML injection 2 mg (2 mg Intravenous Given 03/30/22 2055)  lactated ringers bolus 1,000 mL (1,000 mLs Intravenous New  Bag/Given 03/30/22 2158)  oxyCODONE (Oxy IR/ROXICODONE) immediate release tablet 5 mg (5 mg Oral Given 03/30/22 2201)  ondansetron (ZOFRAN) injection 4 mg (4 mg Intravenous Given 03/30/22 2201)    Mobility walks with device     Focused Assessments Cardiac Assessment Handoff:    Lab Results  Component Value Date   CKTOTAL 11 (L) 10/15/2021   No results found for: "DDIMER" Does the Patient currently have chest pain? No    R Recommendations: See Admitting Provider Note  Report given to:    Additional Notes: pain rt elbow, a and o x 4, cooperative

## 2022-03-31 ENCOUNTER — Inpatient Hospital Stay (HOSPITAL_COMMUNITY): Payer: Medicare Other

## 2022-03-31 DIAGNOSIS — S42211A Unspecified displaced fracture of surgical neck of right humerus, initial encounter for closed fracture: Secondary | ICD-10-CM | POA: Diagnosis not present

## 2022-03-31 LAB — CBC
HCT: 26 % — ABNORMAL LOW (ref 36.0–46.0)
Hemoglobin: 7.9 g/dL — ABNORMAL LOW (ref 12.0–15.0)
MCH: 29.8 pg (ref 26.0–34.0)
MCHC: 30.4 g/dL (ref 30.0–36.0)
MCV: 98.1 fL (ref 80.0–100.0)
Platelets: 233 10*3/uL (ref 150–400)
RBC: 2.65 MIL/uL — ABNORMAL LOW (ref 3.87–5.11)
RDW: 16.1 % — ABNORMAL HIGH (ref 11.5–15.5)
WBC: 20.2 10*3/uL — ABNORMAL HIGH (ref 4.0–10.5)
nRBC: 0.1 % (ref 0.0–0.2)

## 2022-03-31 LAB — BASIC METABOLIC PANEL
Anion gap: 8 (ref 5–15)
BUN: 40 mg/dL — ABNORMAL HIGH (ref 8–23)
CO2: 18 mmol/L — ABNORMAL LOW (ref 22–32)
Calcium: 8 mg/dL — ABNORMAL LOW (ref 8.9–10.3)
Chloride: 109 mmol/L (ref 98–111)
Creatinine, Ser: 1.48 mg/dL — ABNORMAL HIGH (ref 0.44–1.00)
GFR, Estimated: 34 mL/min — ABNORMAL LOW (ref >60)
Glucose, Bld: 100 mg/dL — ABNORMAL HIGH (ref 70–99)
Potassium: 4.9 mmol/L (ref 3.5–5.1)
Sodium: 135 mmol/L (ref 135–145)

## 2022-03-31 LAB — MAGNESIUM: Magnesium: 1.9 mg/dL (ref 1.7–2.4)

## 2022-03-31 LAB — PHOSPHORUS: Phosphorus: 3.9 mg/dL (ref 2.5–4.6)

## 2022-03-31 MED ORDER — ASPIRIN 81 MG PO TBEC
81.0000 mg | DELAYED_RELEASE_TABLET | Freq: Every day | ORAL | Status: DC
  Administered 2022-03-31 – 2022-04-04 (×5): 81 mg via ORAL
  Filled 2022-03-31 (×4): qty 1

## 2022-03-31 MED ORDER — LATANOPROST 0.005 % OP SOLN
1.0000 [drp] | Freq: Every day | OPHTHALMIC | Status: DC
  Administered 2022-03-31 – 2022-04-03 (×4): 1 [drp] via OPHTHALMIC
  Filled 2022-03-31: qty 2.5

## 2022-03-31 MED ORDER — ORAL CARE MOUTH RINSE: 15.0000 mL | OROMUCOSAL | Status: DC | PRN

## 2022-03-31 NOTE — Evaluation (Signed)
Physical Therapy Evaluation Patient Details Name: Chelsea Garcia MRN: [id number] DOB: [birth year] Today's Date: 03/31/2022  History of Present Illness  Pt is an 89 y.o. female with R proximal humerus fx, conservative management with sling. Pt has history of anemia, chronic venous insufficiency, followed by vascular surgery, history of bronchiectasis followed by pulmonary, B/L lower extremity cellulitis with ongoing treatment bactrim and clindamycin  Clinical Impression  Pt admitted with above diagnosis. Previously independent with a rollator for most mobility at home PTA. Lives with husband who also uses assistive device for mobility and will therefore have limited assistance available. Favor SNF for short term rehab prior to returning home at a more independent level. Pt currently required mod assist for bed mobility, and min assist for balance for sit<>stand and step pivot transfers. Further amb held due to incontinence during transfer. Pt currently with functional limitations due to the deficits listed below (see PT Problem List). Pt will benefit from skilled PT to increase their independence and safety with mobility to allow discharge to the venue listed below.          Recommendations for follow up therapy are one component of a multi-disciplinary discharge planning process, led by the attending physician.  Recommendations may be updated based on patient status, additional functional criteria and insurance authorization.  Follow Up Recommendations Skilled nursing-short term rehab (<3 hours/day) Can patient physically be transported by private vehicle: Yes    Assistance Recommended at Discharge Frequent or constant Supervision/Assistance  Patient can return home with the following  A little help with walking and/or transfers;Assistance with cooking/housework;Assist for transportation;A lot of help with bathing/dressing/bathroom    Equipment Recommendations None recommended by PT   Recommendations for Other Services       Functional Status Assessment Patient has had a recent decline in their functional status and demonstrates the ability to make significant improvements in function in a reasonable and predictable amount of time.     Precautions / Restrictions Precautions Precautions: Fall Required Braces or Orthoses: Sling Restrictions Weight Bearing Restrictions: Yes RUE Weight Bearing: Non weight bearing      Mobility  Bed Mobility Overal bed mobility: Needs Assistance Bed Mobility: Supine to Sit     Supine to sit: Mod assist, HOB elevated     General bed mobility comments: Mod assist for trunk support to rise to edge of bed, guiding LEs around with cues to complete task as able. RUE supported.    Transfers Overall transfer level: Needs assistance Equipment used: Quad cane Transfers: Sit to/from Stand, Bed to chair/wheelchair/BSC Sit to Stand: Min assist   Step pivot transfers: Min assist       General transfer comment: Min assist for boost and balance to rise with instructions for UE support on Lt through wide base quad cane. RUE supported with temp sling donned by PT. Performed x2 from bed with cues for safety, sequencing and awareness. Min assist for several steps towards right towards chair, again with min assist for balance, posterior instability noted.    Ambulation/Gait                  Stairs            Wheelchair Mobility    Modified Rankin (Stroke Patients Only)       Balance Overall balance assessment: Needs assistance Sitting-balance support: No upper extremity supported, Feet supported Sitting balance-Leahy Scale: Good     Standing balance support: No upper extremity supported Standing balance-Leahy Scale: Fair Standing balance comment:  prefers cane for support                             Pertinent Vitals/Pain Pain Assessment Pain Assessment: 0-10 Pain Score: 3  Pain Location: RUE Pain  Descriptors / Indicators: Aching Pain Intervention(s): Monitored during session, Premedicated before session, Repositioned, Ice applied    Home Living Family/patient expects to be discharged to:: Private residence Living Arrangements: Spouse/significant other;Children Available Help at Discharge: Family;Available PRN/intermittently Type of Home: House (in-law suite at son and wife's home) Home Access: Level entry       Home Layout: One level Home Equipment: Grab bars - toilet;Grab bars - tub/shower;Air cabin crew (4 wheels) Additional Comments: Pt lives at home with son and daughter in [employer]. son works 9-5, daughter is home all day.    Prior Function Prior Level of Function : Independent/Modified Independent             Mobility Comments: Used rollator most of the time for ambulation ADLs Comments: ind     Hand Dominance   Dominant Hand: Right    Extremity/Trunk Assessment   Upper Extremity Assessment Upper Extremity Assessment: RUE deficits/detail RUE Deficits / Details: NWB precautions to RUE, sling on at all times RUE: Unable to fully assess due to pain;Unable to fully assess due to immobilization RUE Sensation: WNL    Lower Extremity Assessment Lower Extremity Assessment: Defer to PT evaluation       Communication   Communication: No difficulties  Cognition Arousal/Alertness: Awake/alert Behavior During Therapy: WFL for tasks assessed/performed Overall Cognitive Status: Within Functional Limits for tasks assessed                                          General Comments General comments (skin integrity, edema, etc.): BP 94/48 reclined in bed pre activity; post mobility 101/49 in recliner. HR 80, SPO2 95% on RA. Denies dizziness.    Exercises     Assessment/Plan    PT Assessment Patient needs continued PT services  PT Problem List Decreased strength;Decreased range of motion;Decreased activity tolerance;Decreased balance;Decreased  mobility;Decreased knowledge of use of DME;Decreased knowledge of precautions;Pain       PT Treatment Interventions DME instruction;Gait training;Functional mobility training;Therapeutic activities;Therapeutic exercise;Balance training;Neuromuscular re-education;Patient/family education;Modalities    PT Goals (Current goals can be found in the Care Plan section)  Acute Rehab PT Goals Patient Stated Goal: Get well PT Goal Formulation: With patient Time For Goal Achievement: 04/07/22 Potential to Achieve Goals: Good    Frequency Min 3X/week     Co-evaluation               AM-PAC PT "6 Clicks" Mobility  Outcome Measure Help needed turning from your back to your side while in a flat bed without using bedrails?: A Lot Help needed moving from lying on your back to sitting on the side of a flat bed without using bedrails?: A Lot Help needed moving to and from a bed to a chair (including a wheelchair)?: A Little Help needed standing up from a chair using your arms (e.g., wheelchair or bedside chair)?: A Little Help needed to walk in hospital room?: A Lot Help needed climbing 3-5 steps with a railing? : A Lot 6 Click Score: 14    End of Session Equipment Utilized During Treatment: Gait belt Activity Tolerance: Patient tolerated treatment well Patient  left: in chair;with call bell/phone within reach;with chair alarm set (Resp in room) Nurse Communication: Mobility status PT Visit Diagnosis: Unsteadiness on feet (R26.81);Other abnormalities of gait and mobility (R26.89);Muscle weakness (generalized) (M62.81);History of falling (Z91.81);Difficulty in walking, not elsewhere classified (R26.2);Pain Pain - Right/Left: Right Pain - part of body: Arm    Time: [id number] PT Time Calculation (min) (ACUTE ONLY): 35 min   Charges:   PT Evaluation $PT Eval Low Complexity: 1 Low PT Treatments $Therapeutic Activity: 8-22 mins        Candie Mile, PT, DPT Physical Therapist Acute  Rehabilitation Services [place]   Ellouise Newer 03/31/2022, 1:11 PM

## 2022-03-31 NOTE — Hospital Course (Signed)
89 y.o. female with medical history significant for essential thrombocytosis JAK2 positive, on Hydrea since 2015 followed by Dr. Burr Medico, medical hematology-oncologist, anemia of chronic disease, chronic venous insufficiency, followed by vascular surgery, history of bronchiectasis followed by pulmonary, B/L lower extremity cellulitis with ongoing treatment bactrim and clindamycin day#3/10, ambulatory dysfunction uses a rollator to ambulate, who presented to [facility] ED after a mechanical fall today.  She was in her usual state of health prior to that.  The patient was walking outside.  She tripped and fell into concrete.  She did not hit her head.  She caught herself with her right arm.  Gross deformity to the right shoulder was noted, as well as swelling from the right shoulder to the mid humerus.  EMS was activated.  The patient was on the ground for 2 hours prior to EMS arrival.  A sling was placed.  Also complains of left knee pain with abrasion to left knee.  Vital signs were stable.   In the ED, imaging revealed right humeral neck fracture

## 2022-03-31 NOTE — Progress Notes (Signed)
Progress Note   Patient: Chelsea Garcia [id number] DOB: Nov 09, 1933 DOA: 03/30/2022     1 DOS: the patient was seen and examined on 03/31/2022   Brief hospital course: 89 y.o. female with medical history significant for essential thrombocytosis JAK2 positive, on Hydrea since 2015 followed by Dr. Burr Medico, medical hematology-oncologist, anemia of chronic disease, chronic venous insufficiency, followed by vascular surgery, history of bronchiectasis followed by pulmonary, B/L lower extremity cellulitis with ongoing treatment bactrim and clindamycin day#3/10, ambulatory dysfunction uses a rollator to ambulate, who presented to [facility] ED after a mechanical fall today.  She was in her usual state of health prior to that.  The patient was walking outside.  She tripped and fell into concrete.  She did not hit her head.  She caught herself with her right arm.  Gross deformity to the right shoulder was noted, as well as swelling from the right shoulder to the mid humerus.  EMS was activated.  The patient was on the ground for 2 hours prior to EMS arrival.  A sling was placed.  Also complains of left knee pain with abrasion to left knee.  Vital signs were stable.   In the ED, imaging revealed right humeral neck fracture  Assessment and Plan: Angulated right humeral neck fracture, post mechanical fall. Orthopedic surgery consulted Discussed with Ortho today. Recs for non-surgical management with sling and NWB with f/u with Dr. Marcelino Scot in 2 weeks -PT/OT consulted, recs for SNF. TOC following   Essential thrombocytosis with JAK2 positive Plt count 269k, per medical records, she is no longer on hydrea since plt count has normalized Follows with Dr. Burr Medico The patient is on aspirin, will continue for now as pt is no longer considered for surgery   Anemia of chronic disease Presented with hemoglobin of 9.3 at baseline hemoglobin of 10. Hgb 7.9 today, hemodynamically stable -Recheck CBC in AM   Hypothyroidism cont home  levothyroxine Most recent TSH 3.075 in 2019 Will recheck TSH in AM   History of bronchiectasis No acute issues Cont home bronchodilators   B/L lower extremity cellulitis diagnosed outpatient on 03/28/22 Resume home regimen PTA on clindamycin and Bactrim Cont florastor, high risk for c-diff with clindamycin.   Ambulatory dysfunction Ambulates with a rollator at baseline PT OT recs for SNF, TOC following Fall precautions      Subjective: Without complaints this AM  Physical Exam: Vitals:   03/30/22 2131 03/31/22 0414 03/31/22 0700 03/31/22 1325  BP: (!) 95/49 (!) 92/45 (!) 88/45 (!) 101/50  Pulse: 75 73  73  Resp: 17 16  17   Temp: 98 F (36.7 C) 98 F (36.7 C) 98 F (36.7 C) 98.2 F (36.8 C)  TempSrc: Oral Oral Oral Oral  SpO2: 97% 92%  94%  Weight:      Height:       General exam: Awake, laying in bed, in nad Respiratory system: Normal respiratory effort, no wheezing Cardiovascular system: regular rate, s1, s2 Gastrointestinal system: Soft, nondistended, positive BS Central nervous system: CN2-12 grossly intact, strength intact Extremities: Perfused, no clubbing Skin: Normal skin turgor, no notable skin lesions seen Psychiatry: Mood normal // no visual hallucinations   Data Reviewed:  Labs reviewed: Na 135, K 4.9, Cr 1.48, Hgb 7.9   Family Communication: Pt in room, family at bedside  Disposition: Status is: Inpatient Remains inpatient appropriate because: Severity of illness  Planned Discharge Destination: Skilled nursing facility    Author: Marylu Lund, MD 03/31/2022 5:05 PM  For on call review  [email or web address]

## 2022-03-31 NOTE — TOC CAGE-AID Note (Signed)
Transition of Care [facility]) - CAGE-AID Screening   Patient Details  Name: Chelsea Garcia MRN: [id number] Date of Birth: 1933/11/14  Transition of Care (TOC) CM/SW Contact:    Army Melia, RN Phone [phone] 03/31/2022, 9:41 PM    CAGE-AID Screening:    Have You Ever Felt You Ought to Cut Down on Your Drinking or Drug Use?: No Have People Annoyed You By Critizing Your Drinking Or Drug Use?: No Have You Felt Bad Or Guilty About Your Drinking Or Drug Use?: No Have You Ever Had a Drink or Used Drugs First Thing In The Morning to Steady Your Nerves or to Get Rid of a Hangover?: No CAGE-AID Score: 0  Substance Abuse Education Offered: No (no hx drug/alcohol use, no resources indicated)

## 2022-03-31 NOTE — Progress Notes (Signed)
Orthopedic Tech Progress Note Patient Details:  Chelsea Garcia 04/30/1933 [id number]  Ortho Devices Type of Ortho Device: Arm sling Ortho Device/Splint Location: RUE Ortho Device/Splint Interventions: Ordered, Application, Adjustment   Post Interventions Patient Tolerated: Well Instructions Provided: Care of device, Adjustment of device  Chelsea Garcia [name] 03/31/2022, 2:10 PM

## 2022-03-31 NOTE — NC FL2 (Signed)
[place] LEVEL OF CARE FORM     IDENTIFICATION  Patient Name: Chelsea Garcia Birthdate: [birth year] Sex: female Admission Date (Current Location): 03/30/2022  [facility] and [place] Number:  Herbalist and Address:  [facility] [place] [place] [address] [place], [place] [address]      Provider Number: [id number]  Attending Physician Name and Address:  Donne Hazel, MD  Relative Name and Phone Number:  Haacke,lisa Relative   [phone]    Current Level of Care: Hospital Recommended Level of Care: [place] Prior Approval Number:    Date Approved/Denied:   PASRR Number: [id number]  Discharge Plan: SNF    Current Diagnoses: Patient Active Problem List   Diagnosis Date Noted   Humeral surgical neck fracture 03/30/2022   Anemia 02/22/2022   Bronchiectasis without complication ([place]) [id number]   Laryngopharyngeal reflux (LPR) 08/20/2019   Chronic cough 08/19/2019   Globus pharyngeus 07/16/2019   Bilateral hearing loss 11/01/2017   Deviated septum 09/21/2016   Seasonal allergic rhinitis 09/21/2016   Essential thrombocytosis ([place]) 10/01/2013    Orientation RESPIRATION BLADDER Height & Weight     Self, Time, Situation, Place  Normal Continent, External catheter Weight: 110 lb (49.9 kg) Height:  5' (152.4 cm)  BEHAVIORAL SYMPTOMS/MOOD NEUROLOGICAL BOWEL NUTRITION STATUS      Continent Diet (see discharge summary)  AMBULATORY STATUS COMMUNICATION OF NEEDS Skin   Total Care Verbally Skin abrasions, Other (Comment) (ecchymosis)                       Personal Care Assistance Level of Assistance  Bathing, Feeding, Dressing Bathing Assistance: Maximum assistance Feeding assistance: Limited assistance Dressing Assistance: Maximum assistance     Functional Limitations Info  Sight, Hearing, Speech Sight Info: Adequate Hearing Info: Impaired Speech Info: Adequate    SPECIAL CARE FACTORS FREQUENCY  PT (By  licensed PT), OT (By licensed OT)     PT Frequency: 5x week OT Frequency: 5x week            Contractures Contractures Info: Not present    Additional Factors Info  Code Status, Allergies Code Status Info: full Allergies Info: Nystatin, Bee Pollen, Pollen Extract, Albuterol, Doxycycline, Mangifera Indica Fruit Ext (Mango) (Mangifera Indica), Mite (D. Farinae), Sulfamethoxazole           Current Medications (03/31/2022):  This is the current hospital active medication list Current Facility-Administered Medications  Medication Dose Route Frequency Provider Last Rate Last Admin   acetaminophen (TYLENOL) tablet 650 mg  650 mg Oral Q6H PRN Irene Pap N, DO   650 mg at 03/31/22 1009   acidophilus (RISAQUAD) capsule 1 capsule  1 capsule Oral BID Irene Pap N, DO   1 capsule at 03/31/22 1009   brimonidine (ALPHAGAN) 0.2 % ophthalmic solution 1 drop  1 drop Both Eyes BID Irene Pap N, DO       calcium carbonate (OS-CAL - dosed in mg of elemental calcium) tablet 1,250 mg  1,250 mg Oral Daily Irene Pap N, DO   1,250 mg at 03/31/22 1010   clindamycin (CLEOCIN) capsule 300 mg  300 mg Oral Q8H Hall, Carole N, DO   300 mg at 03/31/22 1009   feeding supplement (GLUCERNA SHAKE) (GLUCERNA SHAKE) liquid 237 mL  237 mL Oral BID BM Hall, Carole N, DO       HYDROmorphone (DILAUDID) injection 0.5 mg  0.5 mg Intravenous Q4H PRN Kayleen Memos, DO  0.5 mg at 03/31/22 0303   latanoprost (XALATAN) 0.005 % ophthalmic solution 1 drop  1 drop Both Eyes QHS Hall, Carole N, DO       levothyroxine (SYNTHROID) tablet 50 mcg  50 mcg Oral Q0600 Kayleen Memos, DO   50 mcg at 03/31/22 [id number]   mometasone-formoterol (DULERA) 100-5 MCG/ACT inhaler 2 puff  2 puff Inhalation BID Kayleen Memos, DO       multivitamin with minerals tablet 1 tablet  1 tablet Oral Daily Irene Pap N, DO   1 tablet at 03/31/22 1010   Netarsudil Dimesylate 0.02 % SOLN 1 drop  1 drop Ophthalmic QPM Kayleen Memos, DO       Oral care  mouth rinse  15 mL Mouth Rinse PRN Donne Hazel, MD       oxyCODONE (Oxy IR/ROXICODONE) immediate release tablet 5 mg  5 mg Oral Q6H PRN Irene Pap N, DO   5 mg at 03/31/22 1009   polyethylene glycol (MIRALAX / GLYCOLAX) packet 17 g  17 g Oral Daily PRN Irene Pap N, DO       polyvinyl alcohol (LIQUIFILM TEARS) 1.4 % ophthalmic solution 1 drop  1 drop Both Eyes Daily PRN Irene Pap N, DO       prochlorperazine (COMPAZINE) injection 5 mg  5 mg Intravenous Q6H PRN Irene Pap N, DO       sulfamethoxazole-trimethoprim (BACTRIM) 400-80 MG per tablet 1 tablet  1 tablet Oral Q12H Hall, Carole N, DO   1 tablet at 03/31/22 1010   tamsulosin (FLOMAX) capsule 0.4 mg  0.4 mg Oral QHS Irene Pap N, DO   0.4 mg at 03/30/22 2349     Discharge Medications: Please see discharge summary for a list of discharge medications.  Relevant Imaging Results:  Relevant Lab Results:   Additional Information SSN: [id number].  Pt is vaccinated for covid with boosters.  Joanne Chars, LCSW

## 2022-03-31 NOTE — Consult Note (Signed)
Reason for Consult:Right humerus fx Referring Physician: Marylu Garcia Time called: [id number] Time at bedside: [name] is an 89 y.o. female.  HPI: Chelsea Garcia was walking outside when she stepped off a curb, lost her balance, and fell. She had immediate right shoulder pain and could not get up. She was brought to the ED where x-rays showed a right proximal humerus fx and orthopedic surgery was consulted. She lives at home with a friend and uses a RW to ambulate. She is RHD.  Past Medical History:  Diagnosis Date   Bronchiectasis [facility])    followed by dr Rayann Heman completed pulmonary rehan 06-02-2020   Cellulitis of right foot    recurrent cellulitis last 7 days finishes ciprofloxacin for on 11-26-2020   Colon polyps    Benign. 2 colonoscopies in past. Due now again.   Complication of anesthesia    "got dizzy in 70's"   Diarrhea    resolved now taking imodium ad prn   Dry eye 11/25/2020   both eyes   Endometriosis    required TAH when young   Essential tremor    since 40's   GERD (gastroesophageal reflux disease)    denis   Glaucoma    started in 40's both eyes   Low back pain    since 40's   Migraine    started in 50's last migraine 10 or 20 yrs ago per pt on 11-25-2020   Osteopenia    2014   Seasonal allergies    to Dust, mold and pollen.   Thrombocytosis 09/2013   hx of resolved follows up with dr Truitt Merle oncology   Tick bites    she likes out door activities, 2 this year (2015)   UPJ (ureteropelvic junction) obstruction 11/25/2020   left   Urinary frequency    Wears dentures    upper   Wears glasses     Past Surgical History:  Procedure Laterality Date   CATARACT EXTRACTION Left 02/07/2013   right 2015   colonscopy     10 or 15 yrs ago per pt on 11-25-2020   CYSTOSCOPY W/ URETERAL STENT PLACEMENT Left 11/26/2020   Procedure: CYSTOSCOPY WITH BILATERAL RETROGRADE PYELOGRAM/URETERAL STENT PLACEMENT;  Surgeon: Festus Aloe, MD;  Location: [facility];  Service: Urology;  Laterality: Left;  ONLY NEEDS 30 MIN   CYSTOSCOPY WITH URETEROSCOPY AND STENT PLACEMENT Left 01/12/2021   Procedure: CYSTOSCOPY WITH URETEROSCOPY AND STENT EXCHANGE;  Surgeon: Festus Aloe, MD;  Location: [facility];  Service: Urology;  Laterality: Left;   INGUINAL HERNIA REPAIR Left 01/09/2020   Procedure: LEFT INGUINAL HERNIA REPAIR WITH MESH;  Surgeon: Erroll Luna, MD;  Location: [place];  Service: General;  Laterality: Left;   INSERTION OF MESH Left 01/09/2020   Procedure: INSERTION OF MESH;  Surgeon: Erroll Luna, MD;  Location: [place];  Service: General;  Laterality: Left;   TOTAL ABDOMINAL HYSTERECTOMY  09/10/1968   TUBAL LIGATION     yrs ago    Family History  Problem Relation Age of Onset   Thyroid disease Mother    Cancer Father    Cancer Maternal Aunt 80    Social History:  reports that she quit smoking about 60 years ago. Her smoking use included cigarettes. She has a 8.00 pack-year smoking history. She has never used smokeless tobacco. She reports that she does not currently use alcohol. She reports that she does not use drugs.  Allergies:  Allergies  Allergen Reactions   Nystatin     Swelling of lips   Bee Pollen Other (See Comments) and Cough    Mill dust, pollen. Mill dust, pollen.   Pollen Extract Cough    Mill dust, pollen.   Albuterol     Increased HR, had to go to the ER   Doxycycline Nausea And Vomiting   Mangifera Indica Fruit Ext (Mango) [Mangifera Indica] Diarrhea   Mite (D. Farinae) Other (See Comments)    General allergy   Sulfamethoxazole Other (See Comments)    Irritation in only eye drops Other Other    Medications: I have reviewed the patient's current medications.  Results for orders placed or performed during the hospital encounter of 03/30/22 (from the past 48 hour(s))  CBC     Status: Abnormal   Collection Time: 03/30/22  8:04 PM  Result  Value Ref Range   WBC 31.0 (H) 4.0 - 10.5 K/uL   RBC 3.18 (L) 3.87 - 5.11 MIL/uL   Hemoglobin 9.3 (L) 12.0 - 15.0 g/dL   HCT 30.9 (L) 36.0 - 46.0 %   MCV 97.2 80.0 - 100.0 fL   MCH 29.2 26.0 - 34.0 pg   MCHC 30.1 30.0 - 36.0 g/dL   RDW 15.9 (H) 11.5 - 15.5 %   Platelets 269 150 - 400 K/uL   nRBC 0.1 0.0 - 0.2 %    Comment: Performed at [facility], [address] [place], [place] [id number]  Basic metabolic panel     Status: Abnormal   Collection Time: 03/30/22  8:04 PM  Result Value Ref Range   Sodium 137 135 - 145 mmol/L   Potassium 4.9 3.5 - 5.1 mmol/L   Chloride 105 98 - 111 mmol/L   CO2 24 22 - 32 mmol/L   Glucose, Bld 106 (H) 70 - 99 mg/dL    Comment: Glucose reference range applies only to samples taken after fasting for at least 8 hours.   BUN 44 (H) 8 - 23 mg/dL   Creatinine, Ser 1.70 (H) 0.44 - 1.00 mg/dL   Calcium 8.7 (L) 8.9 - 10.3 mg/dL   GFR, Estimated 29 (L) >60 mL/min    Comment: (NOTE) Calculated using the CKD-EPI Creatinine Equation (2021)    Anion gap 8 5 - 15    Comment: Performed at [facility] [address] [place], [place] [address]  CBC     Status: Abnormal   Collection Time: 03/31/22  2:44 AM  Result Value Ref Range   WBC 20.2 (H) 4.0 - 10.5 K/uL   RBC 2.65 (L) 3.87 - 5.11 MIL/uL   Hemoglobin 7.9 (L) 12.0 - 15.0 g/dL   HCT 26.0 (L) 36.0 - 46.0 %   MCV 98.1 80.0 - 100.0 fL   MCH 29.8 26.0 - 34.0 pg   MCHC 30.4 30.0 - 36.0 g/dL   RDW 16.1 (H) 11.5 - 15.5 %   Platelets 233 150 - 400 K/uL   nRBC 0.1 0.0 - 0.2 %    Comment: Performed at [facility], [place] [address], [place], [place] [id number]  Basic metabolic panel     Status: Abnormal   Collection Time: 03/31/22  2:44 AM  Result Value Ref Range   Sodium 135 135 - 145 mmol/L   Potassium 4.9 3.5 - 5.1 mmol/L   Chloride 109 98 - 111 mmol/L   CO2 18 (L) 22 - 32 mmol/L   Glucose, Bld 100 (  H) 70 - 99 mg/dL    Comment: Glucose reference range applies only to samples taken after  fasting for at least 8 hours.   BUN 40 (H) 8 - 23 mg/dL   Creatinine, Ser 1.48 (H) 0.44 - 1.00 mg/dL   Calcium 8.0 (L) 8.9 - 10.3 mg/dL   GFR, Estimated 34 (L) >60 mL/min    Comment: (NOTE) Calculated using the CKD-EPI Creatinine Equation (2021)    Anion gap 8 5 - 15    Comment: Performed at [facility] [address] [place], [place] [address]  Magnesium     Status: None   Collection Time: 03/31/22  2:44 AM  Result Value Ref Range   Magnesium 1.9 1.7 - 2.4 mg/dL    Comment: Performed at [facility] [address] [place], [place] [address]  Phosphorus     Status: None   Collection Time: 03/31/22  2:44 AM  Result Value Ref Range   Phosphorus 3.9 2.5 - 4.6 mg/dL    Comment: Performed at [facility] [address] [place], [place] [address]    CT 3D RECON AT [facility]  Result Date: 03/31/2022 CLINICAL DATA:  Nonspecific (abnormal) findings on radiological and other examination of musculoskeletal system right shoulder fracture. EXAM: 3-DIMENSIONAL CT IMAGE RENDERING ON ACQUISITION WORKSTATION TECHNIQUE: 3-dimensional CT images were rendered by post-processing of the original CT data on an acquisition workstation. The 3-dimensional CT images were interpreted and findings were reported in the accompanying complete CT report for this study COMPARISON:  Source CT images contemporaneously done. FINDINGS: Again noted is a transverse surgical neck fracture of the proximal right humerus with impaction of proximal fragment into the humeral head, lateral translation of to 1 cm and slight varus angulation. No other acute abnormality is seen. IMPRESSION: Impacted and slightly angulated surgical neck fracture of the proximal right humerus, rendered in rotational 3D reformatting. Electronically Signed   By: Telford Nab M.D.   On: 03/31/2022 06:30   CT SHOULDER RIGHT WO CONTRAST  Result Date: 03/30/2022 CLINICAL DATA:  Right shoulder trauma. EXAM: CT OF THE UPPER RIGHT EXTREMITY  WITHOUT CONTRAST TECHNIQUE: Multidetector CT imaging of the upper right extremity was performed according to the standard protocol. RADIATION DOSE REDUCTION: This exam was performed according to the departmental dose-optimization program which includes automated exposure control, adjustment of the mA and/or kV according to patient size and/or use of iterative reconstruction technique. COMPARISON:  Right shoulder series today. FINDINGS: Bones/Joint/Cartilage There is a transverse surgical neck fracture of the humerus with impaction of the proximal fragment into the humeral head up to 2 cm, lateral translation of the main distal fragment up to 1 cm, and a slight distal fragment varus angulation. There are small comminution fragments scattered around the anterior and posterior fracture margins. There is a mild glenohumeral joint effusion or hemarthrosis. There is no humeral head dislocation or AC joint dislocation. There is trace spurring at the [facility] joint and of the inferomedial humeral head. Labral chondrocalcinosis is also seen. There is slight glenohumeral joint space loss. No scapular fracture or glenoid spurring is seen. The clavicle and visualized right upper ribs are intact. Ligaments Suboptimally assessed by CT. Muscles and Tendons The rotator cuff muscles are normal in volume. There appears to be at least mild edema in the deltoid muscle. There are calcifications in the supraspinatus and infraspinatus tendons consistent with calcific tendinitis or chronic calcific tendinopathy. There is narrowing of the acromiohumeral space at the level of  the conjoined supraspinatus/infraspinatus tendon complex. At least a partial tendon tear could be present in this location. The bicipital tendon is not well seen and cannot be confirmed intact or normally located. The teres minor and subscapularis tendons are also not well seen but at least the latter is probably intact. Soft tissues There is mild generalized edema in the  visualized portion of the upper arm. Scattered linear scarring changes in the visualized right upper lobe. There is posterior atelectasis in the right middle and lower lobes. No pneumothorax or acute process in the visualized right lung. Minimal right pleural effusion incidentally noted. IMPRESSION: 1. Transverse surgical neck fracture of the humerus with impaction of the proximal fragment into the humeral head up to 2 cm, lateral translation of the main distal fragment up to 1 cm, and a slight distal-fragment varus angulation. 2. No humeral head or AC joint dislocation. 3. Mild glenohumeral and AC joint degenerative change with labral chondrocalcinosis. 4. Supraspinatus and infraspinatus calcific tendinitis or chronic calcific tendinopathy. 5. Narrowing of the acromiohumeral space at the level of the conjoined supraspinatus/infraspinatus tendon complex. At least a partial tendon tear could be present in this location. 6. Poor visualization of the subscapularis, bicipital and teres minor tendons which cannot be confirmed intact. 7. Generalized mild edema in the visualized portion of the upper arm. 8. Minimal right pleural effusion. Electronically Signed   By: Telford Nab M.D.   On: 03/30/2022 23:17   DG Knee Complete 4 Views Left  Result Date: 03/30/2022 CLINICAL DATA:  Fall EXAM: LEFT KNEE - COMPLETE 4+ VIEW COMPARISON:  None Available. FINDINGS: Chondrocalcinosis. Joint space narrowing in the medial and lateral compartments. No joint effusion. No acute bony abnormality. Specifically, no fracture, subluxation, or dislocation. IMPRESSION: Chondrocalcinosis with early degenerative changes. No acute bony abnormality. Electronically Signed   By: Rolm Baptise M.D.   On: 03/30/2022 20:15   DG Humerus Right  Result Date: 03/30/2022 CLINICAL DATA:  Fall, deformity right proximal humerus EXAM: RIGHT HUMERUS - 2+ VIEW COMPARISON:  Shoulder series today FINDINGS: There is an angulated fracture through the right  humeral neck. No subluxation or dislocation. AC joint intact. IMPRESSION: Angulated right humeral neck fracture. Electronically Signed   By: Rolm Baptise M.D.   On: 03/30/2022 20:14   DG Shoulder Right  Result Date: 03/30/2022 CLINICAL DATA:  Fall, upper arm deformity. EXAM: RIGHT SHOULDER - 2+ VIEW COMPARISON:  Humerus series today FINDINGS: There is an angulated humeral neck fracture. No subluxation or dislocation. AC joint intact. IMPRESSION: Angulated right humeral neck fracture Electronically Signed   By: Rolm Baptise M.D.   On: 03/30/2022 20:13    Review of Systems  HENT:  Negative for ear discharge, ear pain, hearing loss and tinnitus.   Eyes:  Negative for photophobia and pain.  Respiratory:  Negative for cough and shortness of breath.   Cardiovascular:  Negative for chest pain.  Gastrointestinal:  Negative for abdominal pain, nausea and vomiting.  Genitourinary:  Negative for dysuria, flank pain, frequency and urgency.  Musculoskeletal:  Positive for arthralgias (Right shoulder). Negative for back pain, myalgias and neck pain.  Neurological:  Negative for dizziness and headaches.  Hematological:  Does not bruise/bleed easily.  Psychiatric/Behavioral:  The patient is not nervous/anxious.    Blood pressure (!) 88/45, pulse 73, temperature 98 F (36.7 C), temperature source Oral, resp. rate 16, height 5' (1.524 m), weight 49.9 kg, SpO2 92 %. Physical Exam Constitutional:      General: She is not in  acute distress.    Appearance: She is well-developed. She is not diaphoretic.  HENT:     Head: Normocephalic and atraumatic.  Eyes:     General: No scleral icterus.       Right eye: No discharge.        Left eye: No discharge.     Conjunctiva/sclera: Conjunctivae normal.  Cardiovascular:     Rate and Rhythm: Normal rate and regular rhythm.  Pulmonary:     Effort: Pulmonary effort is normal. No respiratory distress.  Musculoskeletal:     Cervical back: Normal range of motion.      Comments: Right shoulder, elbow, wrist, digits- no skin wounds, mod TTP shoulder, no instability, no blocks to motion  Sens  Ax/R/M/U intact  Mot   Ax/ R/ PIN/ M/ AIN/ U intact, intention tremor  Rad 2+  Skin:    General: Skin is warm and dry.  Neurological:     Mental Status: She is alert.  Psychiatric:        Mood and Affect: Mood normal.        Behavior: Behavior normal.     Assessment/Plan: Right humerus fx -- Plan non-operative management with sling and NWB. F/u with Dr. Marcelino Scot in 2 weeks. Multiple medical problems including essential thrombocytosis JAK2 positive, on Hydrea since 2015 followed by Dr. Burr Medico, anemia of chronic disease, chronic venous insufficiency, history of bronchiectasis, B/L lower extremity cellulitis with ongoing treatment bactrim and clindamycin, and ambulatory dysfunction -- per primary service    Lisette Abu, PA-C Orthopedic Surgery [phone] 03/31/2022, 9:51 AM

## 2022-03-31 NOTE — Evaluation (Addendum)
Occupational Therapy Evaluation Patient Details Name: Chelsea Garcia MRN: [id number] DOB: 07/19/1933 Today's Date: 03/31/2022   History of Present Illness Pt is an 89 y.o. female with R proximal humerus fx, conservative management with sling. Pt has history of anemia, chronic venous insufficiency, followed by vascular surgery, history of bronchiectasis followed by pulmonary, B/L lower extremity cellulitis with ongoing treatment bactrim and clindamycin   Clinical Impression   Pt being seen for recent fall detailed above, Pt has sling and aware of precautions.  Pt tolerated session well and motivated to participate.  Due to RUE NWB restrictions Pt requires significant assistance with ADLs and would benefit greatly from <3 hours/day of therapy, and continued skilled therapy to instruct on compensatory strategies, pain management, DME/AE management. Pt is used to using RW or rollator at home, would potentially benefit from use of quad cane until RUE restrictions are lifted.     Recommendations for follow up therapy are one component of a multi-disciplinary discharge planning process, led by the attending physician.  Recommendations may be updated based on patient status, additional functional criteria and insurance authorization.   Follow Up Recommendations  Skilled nursing-short term rehab (<3 hours/day)     Assistance Recommended at Discharge Frequent or constant Supervision/Assistance  Patient can return home with the following A little help with walking and/or transfers;A little help with bathing/dressing/bathroom;Assistance with cooking/housework;Assist for transportation;Help with stairs or ramp for entrance    Functional Status Assessment  Patient has had a recent decline in their functional status and demonstrates the ability to make significant improvements in function in a reasonable and predictable amount of time.  Equipment Recommendations  Other (comment) (quad cane)     Recommendations for Other Services       Precautions / Restrictions Precautions Precautions: Fall Required Braces or Orthoses: Sling Restrictions Weight Bearing Restrictions: Yes RUE Weight Bearing: Non weight bearing      Mobility Bed Mobility Overal bed mobility: Needs Assistance                  Transfers Overall transfer level: Needs assistance Equipment used: Quad cane Transfers: Bed to chair/wheelchair/BSC, Sit to/from Stand Sit to Stand: Min guard     Step pivot transfers: Min guard     General transfer comment: no LOB observed, good BLE strength during transfer and standing      Balance Overall balance assessment: Needs assistance Sitting-balance support: Feet supported, Single extremity supported Sitting balance-Leahy Scale: Good     Standing balance support: No upper extremity supported Standing balance-Leahy Scale: Good Standing balance comment: able to stand and perform perineal hygiene without holding on.                           ADL either performed or assessed with clinical judgement   ADL Overall ADL's : Needs assistance/impaired Eating/Feeding: Set up   Grooming: Set up   Upper Body Bathing: Moderate assistance   Lower Body Bathing: Maximal assistance   Upper Body Dressing : Maximal assistance   Lower Body Dressing: Maximal assistance   Toilet Transfer: Min guard   Toileting- Clothing Manipulation and Hygiene: Minimal assistance   Tub/ Shower Transfer: Min guard   Functional mobility during ADLs: Min guard General ADL Comments: Pt stood with min guard, able to t/f to BSC min guard, significant assistance with dressing and bathing due to RUE NWB restrictions     Vision Baseline Vision/History: 1 Wears glasses;3 Glaucoma Ability to See in Adequate  Light: 1 Impaired Patient Visual Report: No change from baseline;Peripheral vision impairment       Perception     Praxis      Pertinent Vitals/Pain Pain  Assessment Pain Assessment: 0-10 Pain Score: 3  Pain Location: RUE Pain Descriptors / Indicators: Aching Pain Intervention(s): Monitored during session     Hand Dominance Right   Extremity/Trunk Assessment Upper Extremity Assessment Upper Extremity Assessment: RUE deficits/detail RUE Deficits / Details: NWB precautions to RUE, sling on at all times RUE: Unable to fully assess due to pain;Unable to fully assess due to immobilization RUE Sensation: WNL   Lower Extremity Assessment Lower Extremity Assessment: Defer to PT evaluation       Communication Communication Communication: No difficulties   Cognition Arousal/Alertness: Awake/alert Behavior During Therapy: WFL for tasks assessed/performed Overall Cognitive Status: Within Functional Limits for tasks assessed                                       General Comments       Exercises     Shoulder Instructions      Home Living Family/patient expects to be discharged to:: Private residence Living Arrangements: Spouse/significant other;Children Available Help at Discharge: Family;Available PRN/intermittently Type of Home: House (in-law suite at son and wife's home) Home Access: Level entry     Home Layout: One level     Bathroom Shower/Tub: Occupational psychologist: Standard Bathroom Accessibility: Yes   Home Equipment: Grab bars - toilet;Grab bars - tub/shower;Air cabin crew (4 wheels)   Additional Comments: Pt lives at home with son and daughter in [employer]. son works 9-5, daughter is home all day.      Prior Functioning/Environment Prior Level of Function : Independent/Modified Independent             Mobility Comments: Used rollator most of the time for ambulation ADLs Comments: ind        OT Problem List: Decreased strength;Decreased range of motion;Decreased activity tolerance;Pain;Impaired UE functional use;Impaired balance (sitting and/or standing);Decreased  coordination      OT Treatment/Interventions: Self-care/ADL training;Therapeutic exercise;DME and/or AE instruction;Therapeutic activities;Patient/family education    OT Goals(Current goals can be found in the care plan section) Acute Rehab OT Goals Patient Stated Goal: to reduce pain in RUE, return to PLOF OT Goal Formulation: With patient Time For Goal Achievement: 04/14/22 Potential to Achieve Goals: Good ADL Goals Pt Will Perform Upper Body Dressing: with mod assist;sitting Pt Will Perform Lower Body Dressing: with mod assist;with adaptive equipment;sit to/from stand Pt Will Perform Toileting - Clothing Manipulation and hygiene: sit to/from stand;with min guard assist Additional ADL Goal #1: Pt will demonstrate proper don/doffing and use of sling to ensure carryover of RUE NWB restrictions  OT Frequency: Min 2X/week    Co-evaluation              AM-PAC OT "6 Clicks" Daily Activity     Outcome Measure Help from another person eating meals?: A Little Help from another person taking care of personal grooming?: A Little Help from another person toileting, which includes using toliet, bedpan, or urinal?: A Little Help from another person bathing (including washing, rinsing, drying)?: A Lot Help from another person to put on and taking off regular upper body clothing?: A Lot Help from another person to put on and taking off regular lower body clothing?: A Lot 6 Click Score: 15   End  of Session Equipment Utilized During Treatment: Gait belt Nurse Communication: Mobility status  Activity Tolerance: Patient tolerated treatment well Patient left: in chair;with bed alarm set;with call bell/phone within reach  OT Visit Diagnosis: Other abnormalities of gait and mobility (R26.89);History of falling (Z91.81);Muscle weakness (generalized) (M62.81);Pain;Other symptoms and signs involving the nervous system (R29.898) Pain - Right/Left: Right Pain - part of body: Arm                 Time: [id number] OT Time Calculation (min): 28 min Charges:  OT General Charges $OT Visit: 1 Visit OT Evaluation $OT Eval Moderate Complexity: 1 Mod OT Treatments $Self Care/Home Management : 8-22 mins  [place] 03/31/2022, 12:58 PM

## 2022-03-31 NOTE — TOC Initial Note (Signed)
Transition of Care [facility]) - Initial/Assessment Note    Patient Details  Name: Chelsea Garcia MRN: [id number] Date of Birth: [birth year]  Transition [facility]) CM/SW Contact:    Joanne Chars, LCSW Phone Number: 03/31/2022, 3:58 PM  Clinical Narrative:   CSW met with pt and DIL [name] regarding DC recommendation for SNF.  Permission given to speak with son Jenny Reichmann and DIL [name].  Pt lives at home with husband with with Jenny Reichmann and [name].  No services for pt currently but HH is there working with pt husband currently.  Pt is in agreement with DC plan for SNF, Medicare choice document provided.  Permission given to sent out referral in hub.  Pt is vaccinated for covid with boosters.  Referral sent out in hub for SNF                Expected Discharge Plan: [place] Barriers to Discharge: Continued Medical Work up, SNF Pending bed offer   Patient Goals and CMS Choice Patient states their goals for this hospitalization and ongoing recovery are:: be able to use arm CMS Medicare.gov Compare Post Acute Care list provided to:: Patient Choice offered to / list presented to : Patient      Expected Discharge Plan and Services In-house Referral: Clinical Social Work   Post Acute Care Choice: [place] Living arrangements for the past 2 months: [place]                                      Prior Living Arrangements/Services Living arrangements for the past 2 months: Single Family Home Lives with:: Spouse, Adult Children (son and daughter in [employer]) Patient language and need for interpreter reviewed:: Yes Do you feel safe going back to the place where you live?: Yes      Need for Family Participation in Patient Care: Yes (Comment) Care giver support system in place?: Yes (comment) Current home services: Other (comment) (none) Criminal Activity/Legal Involvement Pertinent to Current Situation/Hospitalization: No - Comment as needed  Activities of  Daily Living Home Assistive Devices/Equipment: Eyeglasses, Walker (specify type) (front wheel walker) ADL Screening (condition at time of admission) Patient's cognitive ability adequate to safely complete daily activities?: Yes Is the patient deaf or have difficulty hearing?: No Does the patient have difficulty seeing, even when wearing glasses/contacts?: No Does the patient have difficulty concentrating, remembering, or making decisions?: No Patient able to express need for assistance with ADLs?: Yes Does the patient have difficulty dressing or bathing?: No Independently performs ADLs?: Yes (appropriate for developmental age) Does the patient have difficulty walking or climbing stairs?: Yes Weakness of Legs: None Weakness of Arms/Hands: Right  Permission Sought/Granted Permission sought to share information with : Family Supports Permission granted to share information with : Yes, Verbal Permission Granted  Share Information with NAME: son Jenny Reichmann, [place]  Permission granted to share info w AGENCY: SNF        Emotional Assessment Appearance:: Appears stated age Attitude/Demeanor/Rapport: Engaged Affect (typically observed): Appropriate, Pleasant Orientation: : Oriented to Self, Oriented to Place, Oriented to  Time, Oriented to Situation      Admission diagnosis:  Humeral surgical neck fracture [S42.213A] Patient Active Problem List   Diagnosis Date Noted   Humeral surgical neck fracture 03/30/2022   Anemia 02/22/2022   Bronchiectasis without complication ([place]) [id number]   Laryngopharyngeal reflux (LPR) 08/20/2019   Chronic cough 08/19/2019  Globus pharyngeus 07/16/2019   Bilateral hearing loss 11/01/2017   Deviated septum 09/21/2016   Seasonal allergic rhinitis 09/21/2016   Essential thrombocytosis ([place]) 10/01/2013   PCP:  Lujean Amel, MD Pharmacy:   [facility], [place] [address] [place]  [address] Phone: [phone] Fax: [place], [address] [place] [address] Phone: [phone] Fax: [phone]  [place] [address] Phone: [phone] Fax: [phone]     Social Determinants of Health (SDOH) Social History: [place]: No Food Insecurity (03/31/2022)  Housing: Low Risk  (03/31/2022)  Transportation Needs: No Transportation Needs (03/31/2022)  Utilities: Not At Risk (03/31/2022)  Depression (PHQ2-9): Medium Risk (04/13/2020)  Tobacco Use: Medium Risk (03/30/2022)   SDOH Interventions:     Readmission Risk Interventions     No data to display

## 2022-03-31 NOTE — Progress Notes (Signed)
Pt refused SCD's d/t bilat cellulitis, states it would be too painful

## 2022-04-01 ENCOUNTER — Other Ambulatory Visit: Payer: Self-pay

## 2022-04-01 DIAGNOSIS — W19XXXA Unspecified fall, initial encounter: Secondary | ICD-10-CM

## 2022-04-01 DIAGNOSIS — S42291A Other displaced fracture of upper end of right humerus, initial encounter for closed fracture: Secondary | ICD-10-CM

## 2022-04-01 DIAGNOSIS — D473 Essential (hemorrhagic) thrombocythemia: Secondary | ICD-10-CM

## 2022-04-01 DIAGNOSIS — N179 Acute kidney failure, unspecified: Secondary | ICD-10-CM | POA: Diagnosis not present

## 2022-04-01 DIAGNOSIS — D649 Anemia, unspecified: Secondary | ICD-10-CM

## 2022-04-01 LAB — CBC
HCT: 26.7 % — ABNORMAL LOW (ref 36.0–46.0)
Hemoglobin: 8.2 g/dL — ABNORMAL LOW (ref 12.0–15.0)
MCH: 29.2 pg (ref 26.0–34.0)
MCHC: 30.7 g/dL (ref 30.0–36.0)
MCV: 95 fL (ref 80.0–100.0)
Platelets: 263 10*3/uL (ref 150–400)
RBC: 2.81 MIL/uL — ABNORMAL LOW (ref 3.87–5.11)
RDW: 16 % — ABNORMAL HIGH (ref 11.5–15.5)
WBC: 25.7 10*3/uL — ABNORMAL HIGH (ref 4.0–10.5)
nRBC: 0.1 % (ref 0.0–0.2)

## 2022-04-01 LAB — COMPREHENSIVE METABOLIC PANEL
ALT: 15 U/L (ref 0–44)
AST: 15 U/L (ref 15–41)
Albumin: 2.7 g/dL — ABNORMAL LOW (ref 3.5–5.0)
Alkaline Phosphatase: 56 U/L (ref 38–126)
Anion gap: 8 (ref 5–15)
BUN: 31 mg/dL — ABNORMAL HIGH (ref 8–23)
CO2: 21 mmol/L — ABNORMAL LOW (ref 22–32)
Calcium: 8.2 mg/dL — ABNORMAL LOW (ref 8.9–10.3)
Chloride: 105 mmol/L (ref 98–111)
Creatinine, Ser: 1.63 mg/dL — ABNORMAL HIGH (ref 0.44–1.00)
GFR, Estimated: 31 mL/min — ABNORMAL LOW (ref >60)
Glucose, Bld: 110 mg/dL — ABNORMAL HIGH (ref 70–99)
Potassium: 4.8 mmol/L (ref 3.5–5.1)
Sodium: 134 mmol/L — ABNORMAL LOW (ref 135–145)
Total Bilirubin: 0.4 mg/dL (ref 0.3–1.2)
Total Protein: 5.2 g/dL — ABNORMAL LOW (ref 6.5–8.1)

## 2022-04-01 LAB — TSH: TSH: 4.326 u[IU]/mL (ref 0.350–4.500)

## 2022-04-01 MED ORDER — CEPHALEXIN 500 MG PO CAPS: 500.0000 mg | ORAL_CAPSULE | Freq: Two times a day (BID) | ORAL | 0 refills | Status: DC

## 2022-04-01 MED ORDER — CEPHALEXIN 500 MG PO CAPS
500.0000 mg | ORAL_CAPSULE | Freq: Two times a day (BID) | ORAL | Status: DC
  Administered 2022-04-01 – 2022-04-04 (×6): 500 mg via ORAL
  Filled 2022-04-01 (×6): qty 1

## 2022-04-01 MED ORDER — DOCUSATE SODIUM 100 MG PO CAPS: 100.0000 mg | ORAL_CAPSULE | Freq: Two times a day (BID) | ORAL | 0 refills | Status: AC

## 2022-04-01 MED ORDER — MELATONIN 3 MG PO TABS
6.0000 mg | ORAL_TABLET | Freq: Every evening | ORAL | Status: AC | PRN
  Administered 2022-04-01 – 2022-04-02 (×2): 6 mg via ORAL
  Filled 2022-04-01 (×2): qty 2

## 2022-04-01 MED ORDER — OXYCODONE HCL 5 MG PO TABS: 5.0000 mg | ORAL_TABLET | Freq: Four times a day (QID) | ORAL | 0 refills | Status: DC | PRN

## 2022-04-01 MED ORDER — RISAQUAD PO CAPS: 1.0000 | ORAL_CAPSULE | Freq: Two times a day (BID) | ORAL | 0 refills | Status: AC

## 2022-04-01 MED ORDER — POLYETHYLENE GLYCOL 3350 17 G PO PACK: 17.0000 g | PACK | Freq: Every day | ORAL | 0 refills | Status: DC | PRN

## 2022-04-01 MED ORDER — DOCUSATE SODIUM 100 MG PO CAPS
100.0000 mg | ORAL_CAPSULE | Freq: Two times a day (BID) | ORAL | Status: DC
  Administered 2022-04-01 – 2022-04-04 (×7): 100 mg via ORAL
  Filled 2022-04-01 (×7): qty 1

## 2022-04-01 NOTE — TOC Progression Note (Addendum)
Transition of Care [facility]) - Progression Note    Patient Details  Name: Chelsea Garcia MRN: [id number] Date of Birth: 05-Nov-1933  Transition of Care [facility]) CM/SW Contact  Joanne Chars, LCSW Phone Number: 04/01/2022, 10:33 AM  Clinical Narrative:   Bed offers provided to pt and DIL [name]. They want to accept offer at [facility].  CSW reached out to [facility] to confirm.  3/[facility] can accept on Monday.    Inpatient order on 3/20.     Expected Discharge Plan: [place] Barriers to Discharge: Continued Medical Work up, SNF Pending bed offer  Expected Discharge Plan and Services In-house Referral: Clinical Social Work   Post Acute Care Choice: [place] Living arrangements for the past 2 months: Single Family Home                                       Social Determinants of Health (SDOH) Interventions SDOH Screenings   Food Insecurity: No Food Insecurity (03/31/2022)  Housing: Low Risk  (03/31/2022)  Transportation Needs: No Transportation Needs (03/31/2022)  Utilities: Not At Risk (03/31/2022)  Depression (PHQ2-9): Medium Risk (04/13/2020)  Tobacco Use: Medium Risk (03/30/2022)    Readmission Risk Interventions     No data to display

## 2022-04-01 NOTE — Progress Notes (Signed)
Physical Therapy Treatment Patient Details Name: Chelsea Garcia MRN: [id number] DOB: 07-18-1933 Today's Date: 04/01/2022   History of Present Illness Pt is an 89 y.o. female with R proximal humerus fx, conservative management with sling. Pt has history of anemia, chronic venous insufficiency, followed by vascular surgery, history of bronchiectasis followed by pulmonary, B/L lower extremity cellulitis with ongoing treatment bactrim and clindamycin    PT Comments    Pt was received in supine and eager for mobility. Pt was able to perform bed mobility with up to mod A for BLE management and scooting. Pt was able to significantly increase gait distance this session with min guard for safety. Pt required one seated and two standing recovery breaks due to fatigue. Pt reported that she enjoys walking and being active. Pt requested to use the bathroom at the end of the session and was able to perform hygiene with supervision. Pt continues to benefit from PT services to progress toward functional mobility goals.     Recommendations for follow up therapy are one component of a multi-disciplinary discharge planning process, led by the attending physician.  Recommendations may be updated based on patient status, additional functional criteria and insurance authorization.  Follow Up Recommendations  Skilled nursing-short term rehab (<3 hours/day) Can patient physically be transported by private vehicle: Yes   Assistance Recommended at Discharge Frequent or constant Supervision/Assistance  Patient can return home with the following A little help with walking and/or transfers;Assistance with cooking/housework;Assist for transportation;A lot of help with bathing/dressing/bathroom   Equipment Recommendations  None recommended by PT    Recommendations for Other Services       Precautions / Restrictions Precautions Precautions: Fall Required Braces or Orthoses: Sling Restrictions Weight Bearing  Restrictions: Yes RUE Weight Bearing: Non weight bearing     Mobility  Bed Mobility Overal bed mobility: Needs Assistance Bed Mobility: Supine to Sit, Sit to Supine     Supine to sit: Mod assist, HOB elevated Sit to supine: Max assist, HOB elevated   General bed mobility comments: Mod A for scooting forward at EOB. Max A for BLE elevation to return to supine and to readjust in bed    Transfers Overall transfer level: Needs assistance Equipment used: Quad cane Transfers: Sit to/from Stand Sit to Stand: Min guard           General transfer comment: from EOB x1, from recliner x1, and from toilet x1 with min guard for safety and cues for hand placement    Ambulation/Gait Ambulation/Gait assistance: Min guard Gait Distance (Feet): 175 Feet Assistive device: Quad cane Gait Pattern/deviations: Step-through pattern, Trunk flexed       General Gait Details: Pt demonstrating step-through pattern with light reliance on quad cane for balance. Min guard for safety. Pt demonstrated trunk flexion throughout due to scoliosis per pt report with ability to briefly improve with cues       Balance Overall balance assessment: Needs assistance Sitting-balance support: Feet supported, Single extremity supported Sitting balance-Leahy Scale: Good Sitting balance - Comments: sitting EOB   Standing balance support: Single extremity supported, During functional activity Standing balance-Leahy Scale: Fair Standing balance comment: with quad cane support                            Cognition Arousal/Alertness: Awake/alert Behavior During Therapy: WFL for tasks assessed/performed Overall Cognitive Status: Within Functional Limits for tasks assessed  Exercises      General Comments        Pertinent Vitals/Pain Pain Assessment Pain Assessment: Faces Faces Pain Scale: Hurts little more Pain Location: RUE Pain  Descriptors / Indicators: Aching, Grimacing Pain Intervention(s): Limited activity within patient's tolerance, Monitored during session, Repositioned     PT Goals (current goals can now be found in the care plan section) Acute Rehab PT Goals Patient Stated Goal: Get well PT Goal Formulation: With patient Time For Goal Achievement: 04/07/22 Potential to Achieve Goals: Good Progress towards PT goals: Progressing toward goals    Frequency    Min 3X/week      PT Plan Current plan remains appropriate       AM-PAC PT "6 Clicks" Mobility   Outcome Measure  Help needed turning from your back to your side while in a flat bed without using bedrails?: A Little Help needed moving from lying on your back to sitting on the side of a flat bed without using bedrails?: A Lot Help needed moving to and from a bed to a chair (including a wheelchair)?: A Little Help needed standing up from a chair using your arms (e.g., wheelchair or bedside chair)?: A Little Help needed to walk in hospital room?: A Little Help needed climbing 3-5 steps with a railing? : A Lot 6 Click Score: 16    End of Session Equipment Utilized During Treatment: Gait belt Activity Tolerance: Patient tolerated treatment well Patient left: with call bell/phone within reach;in bed;with family/visitor present Nurse Communication: Mobility status PT Visit Diagnosis: Unsteadiness on feet (R26.81);Other abnormalities of gait and mobility (R26.89);Muscle weakness (generalized) (M62.81);History of falling (Z91.81);Difficulty in walking, not elsewhere classified (R26.2);Pain     Time: [id number] PT Time Calculation (min) (ACUTE ONLY): 32 min  Charges:  $Gait Training: 23-37 mins                     Michelle Nasuti, PTA Acute Rehabilitation Services Secure Chat Preferred  [phone]    Michelle Nasuti 04/01/2022, 10:46 AM

## 2022-04-01 NOTE — Progress Notes (Signed)
Progress Note   Patient: Chelsea Garcia [id number] DOB: 1933/07/21 DOA: 03/30/2022     2 DOS: the patient was seen and examined on 04/01/2022   Brief hospital course: 89 y.o. female with medical history significant for essential thrombocytosis JAK2 positive, on Hydrea since 2015 followed by Dr. Burr Medico, medical hematology-oncologist, anemia of chronic disease, chronic venous insufficiency, followed by vascular surgery, history of bronchiectasis followed by pulmonary, B/L lower extremity cellulitis with ongoing treatment bactrim and clindamycin day#3/10, ambulatory dysfunction uses a rollator to ambulate, who presented to [facility] ED after a mechanical fall today.  She was in her usual state of health prior to that.  The patient was walking outside.  She tripped and fell into concrete.  She did not hit her head.  She caught herself with her right arm.  Gross deformity to the right shoulder was noted, as well as swelling from the right shoulder to the mid humerus.  EMS was activated.  The patient was on the ground for 2 hours prior to EMS arrival.  A sling was placed.  Also complains of left knee pain with abrasion to left knee.  Vital signs were stable.   In the ED, imaging revealed right humeral neck fracture  Assessment and Plan: Angulated right humeral neck fracture, post mechanical fall. Orthopedic surgery consulted Recommendations noted for non-surgical management with sling and NWB with f/u with Dr. Marcelino Scot in 2 weeks -PT/OT consulted, recs for SNF. TOC following with plans for SNF placement Monday   Essential thrombocytosis with JAK2 positive Plt count 269k, per medical records, she is no longer on hydrea since plt count has normalized Follows with Dr. Burr Medico The patient is on aspirin, which was resumed as pt is no longer considered for surgery   Anemia of chronic disease Presented with hemoglobin of 9.3 at baseline hemoglobin of 10. Hgb 8.2 today, hemodynamically stable -Recheck CBC in AM    Hypothyroidism cont home levothyroxine Most recent TSH 3.075 in 2019 TSH stable at 4.326   History of bronchiectasis No acute issues Cont home bronchodilators   B/L lower extremity cellulitis diagnosed outpatient on 03/28/22 Noted to be improving nicely with antibiotics PTA on clindamycin and Bactrim, transitioned to keflex to cover moderate nonpurulent cellulitis Cont florastor, high risk for c-diff with clindamycin.   Ambulatory dysfunction Ambulates with a rollator at baseline PT OT recs for SNF, TOC following Fall precautions  Constipation -Reports no BM over past couple days -Have ordered scheduled colace -Cont PRN miralax      Subjective: Complaining of constipation this AM  Physical Exam: Vitals:   04/01/22 0422 04/01/22 0824 04/01/22 0851 04/01/22 1327  BP: (!) 90/47 110/60  (!) 100/54  Pulse: 73 88  78  Resp: 18 17  17   Temp:  97.7 F (36.5 C)  98.1 F (36.7 C)  TempSrc:  Oral  Oral  SpO2: 94% 96% 97% 96%  Weight:      Height:       General exam: Conversant, in no acute distress Respiratory system: normal chest rise, clear, no audible wheezing Cardiovascular system: regular rhythm, s1-s2 Gastrointestinal system: Nondistended, nontender, pos BS Central nervous system: No seizures, no tremors Extremities: No cyanosis, no joint deformities Skin: No rashes, no pallor Psychiatry: Affect normal // no auditory hallucinations   Data Reviewed:  Labs reviewed: Na 134, K 4.8, Cr 1.63, WBC 25.7, Hgb 8.2   Family Communication: Pt in room, family at bedside  Disposition: Status is: Inpatient Remains inpatient appropriate because: Severity of illness  Planned Discharge Destination: Skilled nursing facility    Author: Marylu Lund, MD 04/01/2022 3:42 PM  For on call review [email or web address].

## 2022-04-01 NOTE — Progress Notes (Signed)
Occupational Therapy Treatment Patient Details Name: Chelsea Garcia MRN: [id number] DOB: [birth year] Today's Date: 04/01/2022   History of present illness Pt is an 89 y.o. female with R proximal humerus fx, conservative management with sling. Pt has history of anemia, chronic venous insufficiency, followed by vascular surgery, history of bronchiectasis followed by pulmonary, B/L lower extremity cellulitis with ongoing treatment bactrim and clindamycin   OT comments  Pt progressing toward established OT goals. In restroom on arrival; performing pericare mod I; transfer with min guard A. Pt able to sanitize hands min guard A. Functional mobility into hall on pt request. Pt able to allow arm to hand by side standing EOB and lacking ~15 degrees extension. Unable to flex elbow against gravity. Continues to require heavy assist for ADL, bed mobility, and donning/doffing sling. Continue to recommend SNF for continued OT services.    Recommendations for follow up therapy are one component of a multi-disciplinary discharge planning process, led by the attending physician.  Recommendations may be updated based on patient status, additional functional criteria and insurance authorization.    Follow Up Recommendations  Skilled nursing-short term rehab (<3 hours/day)     Assistance Recommended at Discharge Frequent or constant Supervision/Assistance  Patient can return home with the following  A little help with walking and/or transfers;A little help with bathing/dressing/bathroom;Assistance with cooking/housework;Assist for transportation;Help with stairs or ramp for entrance   Equipment Recommendations  Other (comment) (quad cane)    Recommendations for Other Services      Precautions / Restrictions Precautions Precautions: Fall Required Braces or Orthoses: Sling Restrictions Weight Bearing Restrictions: Yes RUE Weight Bearing: Non weight bearing       Mobility Bed Mobility Overal bed mobility:  Needs Assistance Bed Mobility: Sit to Supine       Sit to supine: Max assist, HOB elevated   General bed mobility comments: Max A for BLE elevation into bed as well as controlled descent of trunk    Transfers Overall transfer level: Needs assistance Equipment used: Quad cane Transfers: Sit to/from Stand Sit to Stand: Min guard           General transfer comment: from EOBx1 and toilet x1     Balance Overall balance assessment: Needs assistance Sitting-balance support: Feet supported, Single extremity supported Sitting balance-Leahy Scale: Good Sitting balance - Comments: sitting EOB   Standing balance support: Single extremity supported, During functional activity Standing balance-Leahy Scale: Fair Standing balance comment: with quad cane support                           ADL either performed or assessed with clinical judgement   ADL Overall ADL's : Needs assistance/impaired                 Upper Body Dressing : Maximal assistance;Sitting Upper Body Dressing Details (indicate cue type and reason): to doff/don sling                 Functional mobility during ADLs: Min guard;Cane      Extremity/Trunk Assessment Upper Extremity Assessment Upper Extremity Assessment: RUE deficits/detail RUE Deficits / Details: NWB precautions to RUE, sling on at all times. Able to move hand. Discomfort with any elbow motion. Pt able to extend arm at elbow in standing with gravity assist but unable to lift against gravity. Lacks ~15 degrees extension RUE: Unable to fully assess due to pain;Unable to fully assess due to immobilization RUE Sensation: WNL RUE Coordination: decreased gross  motor   Lower Extremity Assessment Lower Extremity Assessment: Defer to PT evaluation        Vision       Perception     Praxis      Cognition Arousal/Alertness: Awake/alert Behavior During Therapy: WFL for tasks assessed/performed Overall Cognitive Status: Within  Functional Limits for tasks assessed                                 General Comments: for basic daily tasks. Asked the same question twice on one occasion        Exercises      Shoulder Instructions       General Comments      Pertinent Vitals/ Pain       Pain Assessment Pain Assessment: Faces Faces Pain Scale: Hurts little more Pain Location: RUE Pain Descriptors / Indicators: Aching, Grimacing Pain Intervention(s): Limited activity within patient's tolerance, Monitored during session  Home Living                                          Prior Functioning/Environment              Frequency  Min 2X/week        Progress Toward Goals  OT Goals(current goals can now be found in the care plan section)  Progress towards OT goals: Progressing toward goals  Acute Rehab OT Goals Patient Stated Goal: reduce pain OT Goal Formulation: With patient Time For Goal Achievement: 04/14/22 Potential to Achieve Goals: Good ADL Goals Pt Will Perform Upper Body Dressing: with mod assist;sitting Pt Will Perform Lower Body Dressing: with mod assist;with adaptive equipment;sit to/from stand Pt Will Perform Toileting - Clothing Manipulation and hygiene: sit to/from stand;with min guard assist Additional ADL Goal #1: Pt will demonstrate proper don/doffing and use of sling to ensure carryover of RUE NWB restrictions  Plan Discharge plan remains appropriate;Frequency remains appropriate    Co-evaluation                 AM-PAC OT "6 Clicks" Daily Activity     Outcome Measure   Help from another person eating meals?: A Little Help from another person taking care of personal grooming?: A Little Help from another person toileting, which includes using toliet, bedpan, or urinal?: A Little Help from another person bathing (including washing, rinsing, drying)?: A Lot Help from another person to put on and taking off regular upper body clothing?:  A Lot Help from another person to put on and taking off regular lower body clothing?: A Lot 6 Click Score: 15    End of Session Equipment Utilized During Treatment: Gait belt (quad cane)  OT Visit Diagnosis: Other abnormalities of gait and mobility (R26.89);History of falling (Z91.81);Muscle weakness (generalized) (M62.81);Pain;Other symptoms and signs involving the nervous system (R29.898) Pain - Right/Left: Right Pain - part of body: Arm   Activity Tolerance Patient tolerated treatment well   Patient Left in bed;with call bell/phone within reach;with bed alarm set   Nurse Communication Mobility status;Patient requests pain meds        Time: 1350-1421 OT Time Calculation (min): 31 min  Charges: OT General Charges $OT Visit: 1 Visit OT Treatments $Self Care/Home Management : 23-37 mins  Elder Cyphers, OTR/L [facility] Acute Rehabilitation Office: [phone]   Magnus Ivan 04/01/2022,  3:31 PM

## 2022-04-02 DIAGNOSIS — W19XXXA Unspecified fall, initial encounter: Secondary | ICD-10-CM | POA: Diagnosis not present

## 2022-04-02 DIAGNOSIS — N179 Acute kidney failure, unspecified: Secondary | ICD-10-CM | POA: Diagnosis not present

## 2022-04-02 LAB — CBC
HCT: 25.2 % — ABNORMAL LOW (ref 36.0–46.0)
Hemoglobin: 7.8 g/dL — ABNORMAL LOW (ref 12.0–15.0)
MCH: 29.5 pg (ref 26.0–34.0)
MCHC: 31 g/dL (ref 30.0–36.0)
MCV: 95.5 fL (ref 80.0–100.0)
Platelets: 265 10*3/uL (ref 150–400)
RBC: 2.64 MIL/uL — ABNORMAL LOW (ref 3.87–5.11)
RDW: 16.1 % — ABNORMAL HIGH (ref 11.5–15.5)
WBC: 20.2 10*3/uL — ABNORMAL HIGH (ref 4.0–10.5)
nRBC: 0.2 % (ref 0.0–0.2)

## 2022-04-02 LAB — COMPREHENSIVE METABOLIC PANEL
ALT: 15 U/L (ref 0–44)
AST: 17 U/L (ref 15–41)
Albumin: 2.7 g/dL — ABNORMAL LOW (ref 3.5–5.0)
Alkaline Phosphatase: 56 U/L (ref 38–126)
Anion gap: 4 — ABNORMAL LOW (ref 5–15)
BUN: 27 mg/dL — ABNORMAL HIGH (ref 8–23)
CO2: 22 mmol/L (ref 22–32)
Calcium: 7.9 mg/dL — ABNORMAL LOW (ref 8.9–10.3)
Chloride: 107 mmol/L (ref 98–111)
Creatinine, Ser: 1.55 mg/dL — ABNORMAL HIGH (ref 0.44–1.00)
GFR, Estimated: 32 mL/min — ABNORMAL LOW (ref >60)
Glucose, Bld: 104 mg/dL — ABNORMAL HIGH (ref 70–99)
Potassium: 4.9 mmol/L (ref 3.5–5.1)
Sodium: 133 mmol/L — ABNORMAL LOW (ref 135–145)
Total Bilirubin: 0.5 mg/dL (ref 0.3–1.2)
Total Protein: 5.1 g/dL — ABNORMAL LOW (ref 6.5–8.1)

## 2022-04-02 MED ORDER — SENNA 8.6 MG PO TABS
1.0000 | ORAL_TABLET | Freq: Every day | ORAL | Status: DC
  Administered 2022-04-02: 8.6 mg via ORAL
  Filled 2022-04-02 (×3): qty 1

## 2022-04-02 NOTE — Plan of Care (Signed)
  Problem: Activity: Goal: Risk for activity intolerance will decrease Outcome: Progressing   Problem: Pain Managment: Goal: General experience of comfort will improve Outcome: Progressing   Problem: Skin Integrity: Goal: Risk for impaired skin integrity will decrease Outcome: Progressing   

## 2022-04-02 NOTE — Progress Notes (Signed)
Progress Note   Patient: Chelsea Garcia [id number] DOB: 03-01-1933 DOA: 03/30/2022     3 DOS: the patient was seen and examined on 04/02/2022   Brief hospital course: 89 y.o. female with medical history significant for essential thrombocytosis JAK2 positive, on Hydrea since 2015 followed by Dr. Burr Medico, medical hematology-oncologist, anemia of chronic disease, chronic venous insufficiency, followed by vascular surgery, history of bronchiectasis followed by pulmonary, B/L lower extremity cellulitis with ongoing treatment bactrim and clindamycin day#3/10, ambulatory dysfunction uses a rollator to ambulate, who presented to [facility] ED after a mechanical fall today.  She was in her usual state of health prior to that.  The patient was walking outside.  She tripped and fell into concrete.  She did not hit her head.  She caught herself with her right arm.  Gross deformity to the right shoulder was noted, as well as swelling from the right shoulder to the mid humerus.  EMS was activated.  The patient was on the ground for 2 hours prior to EMS arrival.  A sling was placed.  Also complains of left knee pain with abrasion to left knee.  Vital signs were stable.   In the ED, imaging revealed right humeral neck fracture  Assessment and Plan: Angulated right humeral neck fracture, post mechanical fall. Orthopedic surgery consulted Recommendations noted for non-surgical management with sling and NWB with f/u with Dr. Marcelino Scot in 2 weeks -PT/OT consulted, recs for SNF. TOC following planning for SNF placement Monday   Essential thrombocytosis with JAK2 positive Plt count 269k, per medical records, she is no longer on hydrea since plt count has normalized Follows with Dr. Burr Medico The patient is on aspirin, which was resumed as pt is no longer considered for surgery   Anemia of chronic disease Presented with hemoglobin of 9.3 at baseline hemoglobin of 10. Hgb 8.2 today, hemodynamically stable -Continue to follow CBC  trends   Hypothyroidism cont home levothyroxine Most recent TSH 3.075 in 2019 TSH stable at 4.326   History of bronchiectasis No acute issues Cont home bronchodilators   B/L lower extremity cellulitis diagnosed outpatient on 03/28/22 Noted to be improving nicely with antibiotics PTA on clindamycin and Bactrim, transitioned to keflex to cover moderate nonpurulent cellulitis Cont florastor, high risk for c-diff with clindamycin.   Ambulatory dysfunction Ambulates with a rollator at baseline PT OT recs for SNF, TOC following Fall precautions  Constipation -Reports no BM over past couple days -Have ordered scheduled colace -no results despite miralax -Will order senna  Hyponatremia  Leukocytosis      Subjective: Still with out BM yet  Physical Exam: Vitals:   04/01/22 2056 04/02/22 0619 04/02/22 0747 04/02/22 0834  BP: (!) 94/50 (!) 101/48 (!) 117/36 (!) 104/50  Pulse: 72 63 69   Resp: 16 19 16 18   Temp: 98 F (36.7 C) 97.7 F (36.5 C) (!) 97.5 F (36.4 C)   TempSrc: Oral Oral Oral   SpO2: 97% 94% 94% 94%  Weight:      Height:       General exam: Awake, laying in bed, in nad Respiratory system: Normal respiratory effort, no wheezing Cardiovascular system: regular rate, s1, s2 Gastrointestinal system: Soft, nondistended, positive BS Central nervous system: CN2-12 grossly intact, strength intact Extremities: Perfused, no clubbing Skin: Normal skin turgor, no notable skin lesions seen Psychiatry: Mood normal // no visual hallucinations   Data Reviewed:  Labs reviewed: Na 133, K 4.9, Cr 1.55, WBC 20.2, Hgb 7.8   Family Communication: Pt in  room, family not at bedside  Disposition: Status is: Inpatient Remains inpatient appropriate because: Severity of illness  Planned Discharge Destination: Skilled nursing facility    Author: Marylu Lund, MD 04/02/2022 3:20 PM  For on call review [email or web address].

## 2022-04-02 NOTE — Plan of Care (Signed)
  Problem: Activity: Goal: Risk for activity intolerance will decrease Outcome: Progressing   Problem: Nutrition: Goal: Adequate nutrition will be maintained Outcome: Progressing   Problem: Coping: Goal: Level of anxiety will decrease Outcome: Progressing   Problem: Pain Managment: Goal: General experience of comfort will improve Outcome: Progressing   

## 2022-04-03 DIAGNOSIS — W19XXXA Unspecified fall, initial encounter: Secondary | ICD-10-CM | POA: Diagnosis not present

## 2022-04-03 DIAGNOSIS — N179 Acute kidney failure, unspecified: Secondary | ICD-10-CM | POA: Diagnosis not present

## 2022-04-03 LAB — CBC
HCT: 24.5 % — ABNORMAL LOW (ref 36.0–46.0)
Hemoglobin: 7.6 g/dL — ABNORMAL LOW (ref 12.0–15.0)
MCH: 29 pg (ref 26.0–34.0)
MCHC: 31 g/dL (ref 30.0–36.0)
MCV: 93.5 fL (ref 80.0–100.0)
Platelets: 281 10*3/uL (ref 150–400)
RBC: 2.62 MIL/uL — ABNORMAL LOW (ref 3.87–5.11)
RDW: 16.1 % — ABNORMAL HIGH (ref 11.5–15.5)
WBC: 22.2 10*3/uL — ABNORMAL HIGH (ref 4.0–10.5)
nRBC: 0.2 % (ref 0.0–0.2)

## 2022-04-03 LAB — COMPREHENSIVE METABOLIC PANEL
ALT: 15 U/L (ref 0–44)
AST: 16 U/L (ref 15–41)
Albumin: 2.8 g/dL — ABNORMAL LOW (ref 3.5–5.0)
Alkaline Phosphatase: 56 U/L (ref 38–126)
Anion gap: 6 (ref 5–15)
BUN: 27 mg/dL — ABNORMAL HIGH (ref 8–23)
CO2: 22 mmol/L (ref 22–32)
Calcium: 8.2 mg/dL — ABNORMAL LOW (ref 8.9–10.3)
Chloride: 105 mmol/L (ref 98–111)
Creatinine, Ser: 1.38 mg/dL — ABNORMAL HIGH (ref 0.44–1.00)
GFR, Estimated: 37 mL/min — ABNORMAL LOW (ref >60)
Glucose, Bld: 103 mg/dL — ABNORMAL HIGH (ref 70–99)
Potassium: 4.7 mmol/L (ref 3.5–5.1)
Sodium: 133 mmol/L — ABNORMAL LOW (ref 135–145)
Total Bilirubin: 0.8 mg/dL (ref 0.3–1.2)
Total Protein: 5.2 g/dL — ABNORMAL LOW (ref 6.5–8.1)

## 2022-04-03 NOTE — Progress Notes (Signed)
Progress Note   Patient: Chelsea Garcia [id number] DOB: 06/18/1933 DOA: 03/30/2022     4 DOS: the patient was seen and examined on 04/03/2022   Brief hospital course: 89 y.o. female with medical history significant for essential thrombocytosis JAK2 positive, on Hydrea since 2015 followed by Dr. Burr Medico, medical hematology-oncologist, anemia of chronic disease, chronic venous insufficiency, followed by vascular surgery, history of bronchiectasis followed by pulmonary, B/L lower extremity cellulitis with ongoing treatment bactrim and clindamycin day#3/10, ambulatory dysfunction uses a rollator to ambulate, who presented to [facility] ED after a mechanical fall today.  She was in her usual state of health prior to that.  The patient was walking outside.  She tripped and fell into concrete.  She did not hit her head.  She caught herself with her right arm.  Gross deformity to the right shoulder was noted, as well as swelling from the right shoulder to the mid humerus.  EMS was activated.  The patient was on the ground for 2 hours prior to EMS arrival.  A sling was placed.  Also complains of left knee pain with abrasion to left knee.  Vital signs were stable.   In the ED, imaging revealed right humeral neck fracture  Assessment and Plan: Angulated right humeral neck fracture, post mechanical fall. Orthopedic surgery consulted Recommendations noted for non-surgical management with sling and NWB with f/u with Dr. Marcelino Scot in 2 weeks -PT/OT consulted, recs for SNF. TOC following anticipating SNF placement Monday   Essential thrombocytosis with JAK2 positive Plt count 269k, per medical records, she is no longer on hydrea since plt count has normalized Follows with Dr. Burr Medico The patient is on aspirin, which was resumed as pt is no longer considered for surgery   Anemia of chronic disease Presented with hemoglobin of 9.3 at baseline hemoglobin of 10. Hgb 8.2 today, hemodynamically stable -Continue to follow CBC  trends   Hypothyroidism cont home levothyroxine Most recent TSH 3.075 in 2019 TSH stable at 4.326   History of bronchiectasis No acute issues Cont home bronchodilators   B/L lower extremity cellulitis diagnosed outpatient on 03/28/22 Noted to be improving nicely with antibiotics PTA on clindamycin and Bactrim, transitioned to keflex to cover moderate nonpurulent cellulitis Cont florastor, high risk for c-diff with clindamycin.   Ambulatory dysfunction Ambulates with a rollator at baseline PT OT recs for SNF, TOC following Fall precautions  Constipation -resolved with cathartics. Improved  Hyponatremia  Leukocytosis      Subjective: Moved bowels this AM, feeling better  Physical Exam: Vitals:   04/03/22 0405 04/03/22 0723 04/03/22 0820 04/03/22 1630  BP: (!) 112/51 (!) 101/48  (!) 104/52  Pulse: 76 71  72  Resp: 18 16  17   Temp:  97.8 F (36.6 C)  98 F (36.7 C)  TempSrc:  Oral  Oral  SpO2: 97% 96% 96% 96%  Weight:      Height:       General exam: Awake, laying in bed, in nad Respiratory system: Normal respiratory effort, no wheezing Cardiovascular system: regular rate, s1, s2 Gastrointestinal system: Soft, nondistended, positive BS Central nervous system: CN2-12 grossly intact, strength intact Extremities: Perfused, no clubbing Skin: Normal skin turgor, no notable skin lesions seen Psychiatry: Mood normal // no visual hallucinations   Data Reviewed:  Labs reviewed: Na 133, K 4.7, Cr 1.38, WBC 22.2, Hgb 7.6   Family Communication: Pt in room, family not at bedside  Disposition: Status is: Inpatient Remains inpatient appropriate because: Severity of illness  Planned  Discharge Destination: Skilled nursing facility    Author: Marylu Lund, MD 04/03/2022 5:23 PM  For on call review [email or web address].

## 2022-04-04 ENCOUNTER — Inpatient Hospital Stay: Payer: Medicare Other | Attending: Hematology

## 2022-04-04 DIAGNOSIS — L03119 Cellulitis of unspecified part of limb: Secondary | ICD-10-CM | POA: Diagnosis not present

## 2022-04-04 DIAGNOSIS — R2681 Unsteadiness on feet: Secondary | ICD-10-CM | POA: Diagnosis not present

## 2022-04-04 DIAGNOSIS — S80212A Abrasion, left knee, initial encounter: Secondary | ICD-10-CM | POA: Diagnosis not present

## 2022-04-04 DIAGNOSIS — N179 Acute kidney failure, unspecified: Secondary | ICD-10-CM | POA: Diagnosis not present

## 2022-04-04 DIAGNOSIS — D649 Anemia, unspecified: Secondary | ICD-10-CM | POA: Diagnosis not present

## 2022-04-04 DIAGNOSIS — D473 Essential (hemorrhagic) thrombocythemia: Secondary | ICD-10-CM | POA: Diagnosis not present

## 2022-04-04 DIAGNOSIS — W010XXA Fall on same level from slipping, tripping and stumbling without subsequent striking against object, initial encounter: Secondary | ICD-10-CM | POA: Diagnosis not present

## 2022-04-04 DIAGNOSIS — I5032 Chronic diastolic (congestive) heart failure: Secondary | ICD-10-CM | POA: Diagnosis not present

## 2022-04-04 DIAGNOSIS — J479 Bronchiectasis, uncomplicated: Secondary | ICD-10-CM | POA: Diagnosis not present

## 2022-04-04 DIAGNOSIS — Y9301 Activity, walking, marching and hiking: Secondary | ICD-10-CM | POA: Diagnosis not present

## 2022-04-04 DIAGNOSIS — Z23 Encounter for immunization: Secondary | ICD-10-CM | POA: Diagnosis not present

## 2022-04-04 DIAGNOSIS — L309 Dermatitis, unspecified: Secondary | ICD-10-CM | POA: Diagnosis not present

## 2022-04-04 DIAGNOSIS — S42474D Nondisplaced transcondylar fracture of right humerus, subsequent encounter for fracture with routine healing: Secondary | ICD-10-CM | POA: Diagnosis not present

## 2022-04-04 DIAGNOSIS — S42291D Other displaced fracture of upper end of right humerus, subsequent encounter for fracture with routine healing: Secondary | ICD-10-CM | POA: Diagnosis not present

## 2022-04-04 DIAGNOSIS — M6281 Muscle weakness (generalized): Secondary | ICD-10-CM | POA: Diagnosis not present

## 2022-04-04 DIAGNOSIS — M6259 Muscle wasting and atrophy, not elsewhere classified, multiple sites: Secondary | ICD-10-CM | POA: Diagnosis not present

## 2022-04-04 DIAGNOSIS — M84359G Stress fracture, hip, unspecified, subsequent encounter for fracture with delayed healing: Secondary | ICD-10-CM | POA: Diagnosis not present

## 2022-04-04 DIAGNOSIS — E039 Hypothyroidism, unspecified: Secondary | ICD-10-CM | POA: Diagnosis not present

## 2022-04-04 DIAGNOSIS — W19XXXA Unspecified fall, initial encounter: Secondary | ICD-10-CM | POA: Diagnosis not present

## 2022-04-04 DIAGNOSIS — D75839 Thrombocytosis, unspecified: Secondary | ICD-10-CM | POA: Diagnosis not present

## 2022-04-04 DIAGNOSIS — R1311 Dysphagia, oral phase: Secondary | ICD-10-CM | POA: Diagnosis not present

## 2022-04-04 DIAGNOSIS — Z741 Need for assistance with personal care: Secondary | ICD-10-CM | POA: Diagnosis not present

## 2022-04-04 DIAGNOSIS — S42221D 2-part displaced fracture of surgical neck of right humerus, subsequent encounter for fracture with routine healing: Secondary | ICD-10-CM | POA: Diagnosis not present

## 2022-04-04 DIAGNOSIS — Z9181 History of falling: Secondary | ICD-10-CM | POA: Diagnosis not present

## 2022-04-04 DIAGNOSIS — S42291A Other displaced fracture of upper end of right humerus, initial encounter for closed fracture: Secondary | ICD-10-CM | POA: Diagnosis not present

## 2022-04-04 LAB — GLUCOSE, CAPILLARY
Glucose-Capillary: 97 mg/dL (ref 70–99)
Glucose-Capillary: 107 mg/dL — ABNORMAL HIGH (ref 70–99)

## 2022-04-04 MED ORDER — POLYETHYLENE GLYCOL 3350 17 G PO PACK: 17.0000 g | PACK | Freq: Every day | ORAL | 0 refills | Status: DC

## 2022-04-04 MED ORDER — CEPHALEXIN 500 MG PO CAPS: 500.0000 mg | ORAL_CAPSULE | Freq: Two times a day (BID) | ORAL | 0 refills | Status: AC

## 2022-04-04 MED ORDER — FERROUS SULFATE 325 (65 FE) MG PO TBEC: 325.0000 mg | DELAYED_RELEASE_TABLET | Freq: Every day | ORAL | 0 refills | Status: DC

## 2022-04-04 NOTE — Progress Notes (Addendum)
Pt discharging to SNF, [facility]. Copy of instructions printed for facility. Copy also printed for pt's daughter n law and was given to her when pt was taken down to the car.  Pt d/c'd with belongings, via wheelchair with daughter n law transporting pt over to SNF/[facility].  Facility just gave the "green light", ready to receive pt to the social worker here. Did not have opportunity to call facility first before this RN readied pt and took pt to the car to her daughter n law to take over to [place]. Will attempt to call report to receiving nurse.

## 2022-04-04 NOTE — TOC Transition Note (Signed)
Transition of Care [facility]) - CM/SW Discharge Note   Patient Details  Name: Chelsea Garcia MRN: [id number] Date of Birth: Oct 03, 1933  Transition of Care [facility]) CM/SW Contact:  Joanne Chars, LCSW Phone Number: 04/04/2022, 11:45 AM   Clinical Narrative:   Pt discharging to [facility].  RN call report to [phone].  Pt daughter in law will transport, will need pt brought down to main entrance and assistance getting pt into the vehicle.     Final next level of care: Skilled Nursing Facility Barriers to Discharge: Barriers Resolved   Patient Goals and CMS Choice CMS Medicare.gov Compare Post Acute Care list provided to:: Patient Choice offered to / list presented to : Patient  Discharge Placement                Patient chooses bed at:  [facility]) Patient to be transferred to facility by: daughter in law [name] Name of family member notified: daughter in law [name] in room Patient and family notified of of transfer: 04/04/22  Discharge Plan and Services Additional resources added to the After Visit Summary for   In-house Referral: Clinical Social Work   Post Acute Care Choice: [facility]                               Social Determinants of Health (SDOH) Interventions SDOH Screenings   Food Insecurity: No Food Insecurity (03/31/2022)  Housing: Low Risk  (03/31/2022)  Transportation Needs: No Transportation Needs (03/31/2022)  Utilities: Not At Risk (03/31/2022)  Depression (PHQ2-9): Medium Risk (04/13/2020)  Tobacco Use: Medium Risk (03/30/2022)     Readmission Risk Interventions     No data to display

## 2022-04-04 NOTE — Progress Notes (Signed)
Report given to nurse Danae Chen at [facility] once we were able to connect.

## 2022-04-04 NOTE — Plan of Care (Signed)

## 2022-04-04 NOTE — Progress Notes (Signed)
Physical Therapy Treatment Patient Details Name: Chelsea Garcia MRN: [id number] DOB: April 13, 1933 Today's Date: 04/04/2022   History of Present Illness Pt is an 89 y.o. female with R proximal humerus fx, conservative management with sling. Pt has history of anemia, chronic venous insufficiency, followed by vascular surgery, history of bronchiectasis followed by pulmonary, B/L lower extremity cellulitis with ongoing treatment bactrim and clindamycin    PT Comments    Pt was received in supine and agreeable to session. Upon sitting EOB pt reports needing to urinate with the purewick. However, upon standing purewick is noted to not be working and pt required assist with pericare. Pt able to stand for pericare with min A for balance. Pt able to perform x3 stands from EOB with min A for power up. Pt gait distance limited by fatigue from pericare. Pt continues to benefit from PT services to progress toward functional mobility goals.    Recommendations for follow up therapy are one component of a multi-disciplinary discharge planning process, led by the attending physician.  Recommendations may be updated based on patient status, additional functional criteria and insurance authorization.  Follow Up Recommendations  Can patient physically be transported by private vehicle: Yes    Assistance Recommended at Discharge Frequent or constant Supervision/Assistance  Patient can return home with the following A little help with walking and/or transfers;Assistance with cooking/housework;Assist for transportation;A lot of help with bathing/dressing/bathroom   Equipment Recommendations  None recommended by PT    Recommendations for Other Services       Precautions / Restrictions Precautions Precautions: Fall Restrictions Weight Bearing Restrictions: Yes RUE Weight Bearing: Non weight bearing     Mobility  Bed Mobility Overal bed mobility: Needs Assistance Bed Mobility: Supine to Sit     Supine to  sit: Min guard, HOB elevated     General bed mobility comments: Pt able to advance BLE and elevate trunk with increased time and cues for sequencing, but no physical assist    Transfers Overall transfer level: Needs assistance Equipment used: Quad cane Transfers: Sit to/from Stand Sit to Stand: Min assist           General transfer comment: Pt required min A for power up from lowered EOB x3    Ambulation/Gait Ambulation/Gait assistance: Min guard Gait Distance (Feet): 75 Feet Assistive device: Quad cane Gait Pattern/deviations: Step-through pattern, Trunk flexed       General Gait Details: Pt demonstrating step-through pattern with min guard for safety.      Balance Overall balance assessment: Needs assistance Sitting-balance support: Feet supported, Single extremity supported Sitting balance-Leahy Scale: Good Sitting balance - Comments: sitting EOB   Standing balance support: Single extremity supported, During functional activity Standing balance-Leahy Scale: Fair Standing balance comment: with quad cane support                            Cognition Arousal/Alertness: Awake/alert Behavior During Therapy: WFL for tasks assessed/performed Overall Cognitive Status: Within Functional Limits for tasks assessed                                          Exercises      General Comments        Pertinent Vitals/Pain Pain Assessment Pain Assessment: 0-10 Pain Score: 5  Pain Location: RUE Pain Descriptors / Indicators: Aching, Grimacing Pain Intervention(s): Limited activity within  patient's tolerance, Monitored during session, Repositioned     PT Goals (current goals can now be found in the care plan section) Acute Rehab PT Goals Patient Stated Goal: Get well PT Goal Formulation: With patient Time For Goal Achievement: 04/07/22 Potential to Achieve Goals: Good Progress towards PT goals: Progressing toward goals     Frequency    Min 3X/week      PT Plan Current plan remains appropriate       AM-PAC PT "6 Clicks" Mobility   Outcome Measure  Help needed turning from your back to your side while in a flat bed without using bedrails?: A Little Help needed moving from lying on your back to sitting on the side of a flat bed without using bedrails?: A Little Help needed moving to and from a bed to a chair (including a wheelchair)?: A Little Help needed standing up from a chair using your arms (e.g., wheelchair or bedside chair)?: A Little Help needed to walk in hospital room?: A Little Help needed climbing 3-5 steps with a railing? : A Lot 6 Click Score: 17    End of Session Equipment Utilized During Treatment: Gait belt Activity Tolerance: Patient tolerated treatment well Patient left: with call bell/phone within reach;with family/visitor present;in chair;with chair alarm set Nurse Communication: Mobility status PT Visit Diagnosis: Unsteadiness on feet (R26.81);Other abnormalities of gait and mobility (R26.89);Muscle weakness (generalized) (M62.81);History of falling (Z91.81);Difficulty in walking, not elsewhere classified (R26.2);Pain     Time: [id number] PT Time Calculation (min) (ACUTE ONLY): 36 min  Charges:  $Gait Training: 8-22 mins $Therapeutic Activity: 8-22 mins                     Michelle Nasuti, PTA Acute Rehabilitation Services Secure Chat Preferred  [phone]    Michelle Nasuti 04/04/2022, 12:41 PM

## 2022-04-04 NOTE — Progress Notes (Signed)
Attempted to call report to facility. Nurse Sharolyn Douglas not available. Name and number given to staff member that answered and they will pass it on to the nurse to return call.

## 2022-04-04 NOTE — Discharge Summary (Signed)
Physician Discharge Summary   Patient: Chelsea Garcia MRN: [id number] DOB: 07/25/1933  Admit date:     03/30/2022  Discharge date: 04/04/22  Discharge Physician: Marylu Lund   PCP: Lujean Amel, MD   Recommendations at discharge:    F/u with PCP in 1-2 weeks Follow up with Dr. Marcelino Scot as scheduled in 2 weeks Please monitor closely for constipation Recommend repeat CBC in 1 week Consider f/u with Hematology, Dr. Burr Medico, in 1-2 weeks  Discharge Diagnoses: Principal Problem:   Humeral surgical neck fracture  Resolved Problems:   * No resolved hospital problems. *  Hospital Course: 89 y.o. female with medical history significant for essential thrombocytosis JAK2 positive, on Hydrea since 2015 followed by Dr. Burr Medico, medical hematology-oncologist, anemia of chronic disease, chronic venous insufficiency, followed by vascular surgery, history of bronchiectasis followed by pulmonary, B/L lower extremity cellulitis with ongoing treatment bactrim and clindamycin day#3/10, ambulatory dysfunction uses a rollator to ambulate, who presented to [facility] ED after a mechanical fall today.  She was in her usual state of health prior to that.  The patient was walking outside.  She tripped and fell into concrete.  She did not hit her head.  She caught herself with her right arm.  Gross deformity to the right shoulder was noted, as well as swelling from the right shoulder to the mid humerus.  EMS was activated.  The patient was on the ground for 2 hours prior to EMS arrival.  A sling was placed.  Also complains of left knee pain with abrasion to left knee.  Vital signs were stable.   In the ED, imaging revealed right humeral neck fracture  Assessment and Plan: Angulated right humeral neck fracture, post mechanical fall. Orthopedic surgery consulted Recommendations noted for non-surgical management with sling and NWB with f/u with Dr. Marcelino Scot in 2 weeks -PT/OT consulted, recs for SNF.   Essential thrombocytosis with  JAK2 positive Plt count 269k, per medical records, she is no longer on hydrea since plt count has normalized Follows with Dr. Burr Medico The patient is on aspirin, which was resumed as pt is no longer considered for surgery   Anemia of chronic disease Presented with hemoglobin of 9.3 at baseline hemoglobin of 10. Hgb remained stable, albeit low in the mid-7's -Have prescribed iron supplementation on d/c. Please monitor closely for constipation -No obvious source of blood loss.  -Recommend close follow up with primary Hematologist after d/c. I have alerted pt's Hematologist of pt's admit   Hypothyroidism cont home levothyroxine Most recent TSH 3.075 in 2019 TSH stable at 4.326   History of bronchiectasis No acute issues Cont home bronchodilators   B/L lower extremity cellulitis diagnosed outpatient on 03/28/22 Noted to be improving nicely with antibiotics PTA on clindamycin and Bactrim, transitioned to keflex to cover moderate nonpurulent cellulitis Continue antibiotics until 3/27 Much improved   Ambulatory dysfunction Ambulates with a rollator at baseline PT OT recs for SNF Fall precautions   Constipation -resolved with cathartics. Improved -Please monitor bowel function and give cathartics as needed   Hyponatremia   Leukocytosis     Consultants: Orthopedic Surgery Procedures performed:   Disposition: Skilled nursing facility Diet recommendation:  Regular diet DISCHARGE MEDICATION: Allergies as of 04/04/2022       Reactions   Nystatin    Swelling of lips   Bee Pollen Other (See Comments), Cough   Mill dust, pollen. Mill dust, pollen.   Pollen Extract Cough   Mill dust, pollen.   Albuterol  Increased HR, had to go to the ER   Doxycycline Nausea And Vomiting   Mangifera Indica Fruit Ext (mango) [mangifera Indica] Diarrhea   Mite (d. Yehuda Mao) Other (See Comments)   General allergy   Sulfamethoxazole Other (See Comments)   Irritation in only eye  drops Other Other        Medication List     STOP taking these medications    clindamycin 300 MG capsule Commonly known as: CLEOCIN   loperamide 2 MG tablet Commonly known as: IMODIUM A-D   sulfamethoxazole-trimethoprim 800-160 MG tablet Commonly known as: BACTRIM DS       TAKE these medications    acetaminophen 500 MG tablet Commonly known as: TYLENOL Take 500 mg by mouth every 6 (six) hours as needed for moderate pain.   acidophilus Caps capsule Take 1 capsule by mouth 2 (two) times daily.   aspirin EC 81 MG tablet Take 81 mg by mouth daily. Swallow whole.   brimonidine 0.2 % ophthalmic solution Commonly known as: ALPHAGAN Place 1 drop into both eyes 2 (two) times daily.   calcium carbonate 1250 (500 Ca) MG chewable tablet Commonly known as: OS-CAL Chew 1 tablet by mouth daily.   cephALEXin 500 MG capsule Commonly known as: KEFLEX Take 1 capsule (500 mg total) by mouth every 12 (twelve) hours for 2 days.   docusate sodium 100 MG capsule Commonly known as: COLACE Take 1 capsule (100 mg total) by mouth 2 (two) times daily.   feeding supplement (GLUCERNA SHAKE) Liqd Take 237 mLs by mouth 2 (two) times daily between meals.   ferrous sulfate 325 (65 FE) MG EC tablet Take 1 tablet (325 mg total) by mouth daily with breakfast.   fluticasone-salmeterol 100-50 MCG/ACT Aepb Commonly known as: Advair Diskus Inhale 1 puff into the lungs 2 (two) times daily.   levothyroxine 50 MCG tablet Commonly known as: SYNTHROID Take 50 mcg by mouth every morning.   Multi Vitamin Tabs Take 2 tablets by mouth daily.   oxyCODONE 5 MG immediate release tablet Commonly known as: Oxy IR/ROXICODONE Take 1 tablet (5 mg total) by mouth every 6 (six) hours as needed for moderate pain or breakthrough pain.   polyethylene glycol 17 g packet Commonly known as: MIRALAX / GLYCOLAX Take 17 g by mouth daily. Hold for diarrhea   Rhopressa 0.02 % Soln Generic drug: Netarsudil  Dimesylate Apply 1 drop to eye every evening. Both eyes   SYSTANE OP Place 1 drop into both eyes daily as needed (dry eyes). Uses eye drops prn for dry eye   tamsulosin 0.4 MG Caps capsule Commonly known as: FLOMAX Take 0.4 mg by mouth at bedtime.   Vyzulta 0.024 % Soln Generic drug: Latanoprostene Bunod Apply 1 drop to eye every evening. Both eyes        Follow-up Information     Koirala, [name], MD Follow up in 1 week(s).   Specialty: Family Medicine Why: Hospital follow up Contact information: [place] [id number] [phone]                Discharge Exam: Danley Danker Weights   03/30/22 1807  Weight: 49.9 kg   General exam: Awake, laying in bed, in nad Respiratory system: Normal respiratory effort, no wheezing Cardiovascular system: regular rate, s1, s2 Gastrointestinal system: Soft, nondistended, positive BS Central nervous system: CN2-12 grossly intact, strength intact Extremities: Perfused, no clubbing Skin: Normal skin turgor, no notable skin lesions seen Psychiatry: Mood normal // no  visual hallucinations   Condition at discharge: fair  The results of significant diagnostics from this hospitalization (including imaging, microbiology, ancillary and laboratory) are listed below for reference.   Imaging Studies: CT 3D RECON AT SCANNER  Result Date: 03/31/2022 CLINICAL DATA:  Nonspecific (abnormal) findings on radiological and other examination of musculoskeletal system right shoulder fracture. EXAM: 3-DIMENSIONAL CT IMAGE RENDERING ON ACQUISITION WORKSTATION TECHNIQUE: 3-dimensional CT images were rendered by post-processing of the original CT data on an acquisition workstation. The 3-dimensional CT images were interpreted and findings were reported in the accompanying complete CT report for this study COMPARISON:  Source CT images contemporaneously done. FINDINGS: Again noted is a transverse surgical neck fracture of the  proximal right humerus with impaction of proximal fragment into the humeral head, lateral translation of to 1 cm and slight varus angulation. No other acute abnormality is seen. IMPRESSION: Impacted and slightly angulated surgical neck fracture of the proximal right humerus, rendered in rotational 3D reformatting. Electronically Signed   By: Telford Nab M.D.   On: 03/31/2022 06:30   CT SHOULDER RIGHT WO CONTRAST  Result Date: 03/30/2022 CLINICAL DATA:  Right shoulder trauma. EXAM: CT OF THE UPPER RIGHT EXTREMITY WITHOUT CONTRAST TECHNIQUE: Multidetector CT imaging of the upper right extremity was performed according to the standard protocol. RADIATION DOSE REDUCTION: This exam was performed according to the departmental dose-optimization program which includes automated exposure control, adjustment of the mA and/or kV according to patient size and/or use of iterative reconstruction technique. COMPARISON:  Right shoulder series today. FINDINGS: Bones/Joint/Cartilage There is a transverse surgical neck fracture of the humerus with impaction of the proximal fragment into the humeral head up to 2 cm, lateral translation of the main distal fragment up to 1 cm, and a slight distal fragment varus angulation. There are small comminution fragments scattered around the anterior and posterior fracture margins. There is a mild glenohumeral joint effusion or hemarthrosis. There is no humeral head dislocation or AC joint dislocation. There is trace spurring at the [facility] joint and of the inferomedial humeral head. Labral chondrocalcinosis is also seen. There is slight glenohumeral joint space loss. No scapular fracture or glenoid spurring is seen. The clavicle and visualized right upper ribs are intact. Ligaments Suboptimally assessed by CT. Muscles and Tendons The rotator cuff muscles are normal in volume. There appears to be at least mild edema in the deltoid muscle. There are calcifications in the supraspinatus and  infraspinatus tendons consistent with calcific tendinitis or chronic calcific tendinopathy. There is narrowing of the acromiohumeral space at the level of the conjoined supraspinatus/infraspinatus tendon complex. At least a partial tendon tear could be present in this location. The bicipital tendon is not well seen and cannot be confirmed intact or normally located. The teres minor and subscapularis tendons are also not well seen but at least the latter is probably intact. Soft tissues There is mild generalized edema in the visualized portion of the upper arm. Scattered linear scarring changes in the visualized right upper lobe. There is posterior atelectasis in the right middle and lower lobes. No pneumothorax or acute process in the visualized right lung. Minimal right pleural effusion incidentally noted. IMPRESSION: 1. Transverse surgical neck fracture of the humerus with impaction of the proximal fragment into the humeral head up to 2 cm, lateral translation of the main distal fragment up to 1 cm, and a slight distal-fragment varus angulation. 2. No humeral head or AC joint dislocation. 3. Mild glenohumeral and AC joint degenerative change with labral  chondrocalcinosis. 4. Supraspinatus and infraspinatus calcific tendinitis or chronic calcific tendinopathy. 5. Narrowing of the acromiohumeral space at the level of the conjoined supraspinatus/infraspinatus tendon complex. At least a partial tendon tear could be present in this location. 6. Poor visualization of the subscapularis, bicipital and teres minor tendons which cannot be confirmed intact. 7. Generalized mild edema in the visualized portion of the upper arm. 8. Minimal right pleural effusion. Electronically Signed   By: Telford Nab M.D.   On: 03/30/2022 23:17   DG Knee Complete 4 Views Left  Result Date: 03/30/2022 CLINICAL DATA:  Fall EXAM: LEFT KNEE - COMPLETE 4+ VIEW COMPARISON:  None Available. FINDINGS: Chondrocalcinosis. Joint space narrowing  in the medial and lateral compartments. No joint effusion. No acute bony abnormality. Specifically, no fracture, subluxation, or dislocation. IMPRESSION: Chondrocalcinosis with early degenerative changes. No acute bony abnormality. Electronically Signed   By: Rolm Baptise M.D.   On: 03/30/2022 20:15   DG Humerus Right  Result Date: 03/30/2022 CLINICAL DATA:  Fall, deformity right proximal humerus EXAM: RIGHT HUMERUS - 2+ VIEW COMPARISON:  Shoulder series today FINDINGS: There is an angulated fracture through the right humeral neck. No subluxation or dislocation. AC joint intact. IMPRESSION: Angulated right humeral neck fracture. Electronically Signed   By: Rolm Baptise M.D.   On: 03/30/2022 20:14   DG Shoulder Right  Result Date: 03/30/2022 CLINICAL DATA:  Fall, upper arm deformity. EXAM: RIGHT SHOULDER - 2+ VIEW COMPARISON:  Humerus series today FINDINGS: There is an angulated humeral neck fracture. No subluxation or dislocation. AC joint intact. IMPRESSION: Angulated right humeral neck fracture Electronically Signed   By: Rolm Baptise M.D.   On: 03/30/2022 20:13    Microbiology: Results for orders placed or performed during the hospital encounter of 10/15/21  Resp Panel by RT-PCR (Flu A&B, Covid) Urine, Clean Catch     Status: None   Collection Time: 10/15/21 12:12 PM   Specimen: Urine, Clean Catch; Nasal Swab  Result Value Ref Range Status   SARS Coronavirus 2 by RT PCR NEGATIVE NEGATIVE Final    Comment: (NOTE) SARS-CoV-2 target nucleic acids are NOT DETECTED.  The SARS-CoV-2 RNA is generally detectable in upper respiratory specimens during the acute phase of infection. The lowest concentration of SARS-CoV-2 viral copies this assay can detect is 138 copies/mL. A negative result does not preclude SARS-Cov-2 infection and should not be used as the sole basis for treatment or other patient management decisions. A negative result may occur with  improper specimen collection/handling,  submission of specimen other than nasopharyngeal swab, presence of viral mutation(s) within the areas targeted by this assay, and inadequate number of viral copies(<138 copies/mL). A negative result must be combined with clinical observations, patient history, and epidemiological information. The expected result is Negative.  Fact Sheet for Patients:  EntrepreneurPulse.com.au  Fact Sheet for Healthcare Providers:  IncredibleEmployment.be  This test is no t yet approved or cleared by the [place] FDA and  has been authorized for detection and/or diagnosis of SARS-CoV-2 by FDA under an Emergency Use Authorization (EUA). This EUA will remain  in effect (meaning this test can be used) for the duration of the COVID-19 declaration under Section 564(b)(1) of the Act, 21 U.S.C.section 360bbb-3(b)(1), unless the authorization is terminated  or revoked sooner.       Influenza A by PCR NEGATIVE NEGATIVE Final   Influenza B by PCR NEGATIVE NEGATIVE Final    Comment: (NOTE) The Xpert Xpress SARS-CoV-2/FLU/RSV plus assay is intended as an  aid in the diagnosis of influenza from Nasopharyngeal swab specimens and should not be used as a sole basis for treatment. Nasal washings and aspirates are unacceptable for Xpert Xpress SARS-CoV-2/FLU/RSV testing.  Fact Sheet for Patients: EntrepreneurPulse.com.au  Fact Sheet for Healthcare Providers: IncredibleEmployment.be  This test is not yet approved or cleared by the [place] FDA and has been authorized for detection and/or diagnosis of SARS-CoV-2 by FDA under an Emergency Use Authorization (EUA). This EUA will remain in effect (meaning this test can be used) for the duration of the COVID-19 declaration under Section 564(b)(1) of the Act, 21 U.S.C. section 360bbb-3(b)(1), unless the authorization is terminated or revoked.  Performed at [facility], [place] [address] [place], [place] [address]     Labs: CBC: Recent Labs  Lab 03/30/22 2004 03/31/22 0244 04/01/22 0601 04/02/22 0225 04/03/22 0212  WBC 31.0* 20.2* 25.7* 20.2* 22.2*  HGB 9.3* 7.9* 8.2* 7.8* 7.6*  HCT 30.9* 26.0* 26.7* 25.2* 24.5*  MCV 97.2 98.1 95.0 95.5 93.5  PLT 269 233 263 265 [id number]   Basic Metabolic Panel: Recent Labs  Lab 03/30/22 2004 03/31/22 0244 04/01/22 0601 04/02/22 0225 04/03/22 0212  NA 137 135 134* 133* 133*  K 4.9 4.9 4.8 4.9 4.7  CL 105 109 105 107 105  CO2 24 18* 21* 22 22  GLUCOSE 106* 100* 110* 104* 103*  BUN 44* 40* 31* 27* 27*  CREATININE 1.70* 1.48* 1.63* 1.55* 1.38*  CALCIUM 8.7* 8.0* 8.2* 7.9* 8.2*  MG  --  1.9  --   --   --   PHOS  --  3.9  --   --   --    Liver Function Tests: Recent Labs  Lab 04/01/22 0601 04/02/22 0225 04/03/22 0212  AST 15 17 16   ALT 15 15 15   ALKPHOS 56 56 56  BILITOT 0.4 0.5 0.8  PROT 5.2* 5.1* 5.2*  ALBUMIN 2.7* 2.7* 2.8*   CBG: Recent Labs  Lab 04/04/22 0729  GLUCAP 97    Discharge time spent: less than 30 minutes.  Signed: Marylu Lund, MD [facility] 04/04/2022

## 2022-04-05 ENCOUNTER — Telehealth: Payer: Self-pay | Admitting: Hematology

## 2022-04-05 NOTE — Telephone Encounter (Signed)
Contacted patient to scheduled appointments. Left message with appointment details and a call back number if patient had any questions or could not accommodate the time we provided.   

## 2022-04-06 DIAGNOSIS — R2681 Unsteadiness on feet: Secondary | ICD-10-CM | POA: Diagnosis not present

## 2022-04-06 DIAGNOSIS — Z9181 History of falling: Secondary | ICD-10-CM | POA: Diagnosis not present

## 2022-04-06 DIAGNOSIS — M6259 Muscle wasting and atrophy, not elsewhere classified, multiple sites: Secondary | ICD-10-CM | POA: Diagnosis not present

## 2022-04-06 DIAGNOSIS — M6281 Muscle weakness (generalized): Secondary | ICD-10-CM | POA: Diagnosis not present

## 2022-04-06 DIAGNOSIS — S42291D Other displaced fracture of upper end of right humerus, subsequent encounter for fracture with routine healing: Secondary | ICD-10-CM | POA: Diagnosis not present

## 2022-04-06 DIAGNOSIS — Z741 Need for assistance with personal care: Secondary | ICD-10-CM | POA: Diagnosis not present

## 2022-04-07 ENCOUNTER — Telehealth: Payer: Medicare Other | Admitting: Hematology

## 2022-04-12 ENCOUNTER — Other Ambulatory Visit: Payer: Self-pay | Admitting: Hematology

## 2022-04-12 ENCOUNTER — Inpatient Hospital Stay: Payer: Medicare Other | Attending: Hematology

## 2022-04-13 DIAGNOSIS — R2681 Unsteadiness on feet: Secondary | ICD-10-CM | POA: Diagnosis not present

## 2022-04-13 DIAGNOSIS — M6281 Muscle weakness (generalized): Secondary | ICD-10-CM | POA: Diagnosis not present

## 2022-04-13 DIAGNOSIS — M6259 Muscle wasting and atrophy, not elsewhere classified, multiple sites: Secondary | ICD-10-CM | POA: Diagnosis not present

## 2022-04-13 DIAGNOSIS — Z9181 History of falling: Secondary | ICD-10-CM | POA: Diagnosis not present

## 2022-04-13 DIAGNOSIS — S42291D Other displaced fracture of upper end of right humerus, subsequent encounter for fracture with routine healing: Secondary | ICD-10-CM | POA: Diagnosis not present

## 2022-04-13 DIAGNOSIS — Z741 Need for assistance with personal care: Secondary | ICD-10-CM | POA: Diagnosis not present

## 2022-04-15 DIAGNOSIS — S42474D Nondisplaced transcondylar fracture of right humerus, subsequent encounter for fracture with routine healing: Secondary | ICD-10-CM | POA: Diagnosis not present

## 2022-04-15 DIAGNOSIS — L03119 Cellulitis of unspecified part of limb: Secondary | ICD-10-CM | POA: Diagnosis not present

## 2022-04-15 DIAGNOSIS — E039 Hypothyroidism, unspecified: Secondary | ICD-10-CM | POA: Diagnosis not present

## 2022-04-15 DIAGNOSIS — D75839 Thrombocytosis, unspecified: Secondary | ICD-10-CM | POA: Diagnosis not present

## 2022-04-18 ENCOUNTER — Ambulatory Visit: Payer: Medicare Other | Admitting: Podiatry

## 2022-04-18 DIAGNOSIS — S42221D 2-part displaced fracture of surgical neck of right humerus, subsequent encounter for fracture with routine healing: Secondary | ICD-10-CM | POA: Diagnosis not present

## 2022-04-19 DIAGNOSIS — M84359G Stress fracture, hip, unspecified, subsequent encounter for fracture with delayed healing: Secondary | ICD-10-CM | POA: Diagnosis not present

## 2022-04-19 DIAGNOSIS — L309 Dermatitis, unspecified: Secondary | ICD-10-CM | POA: Diagnosis not present

## 2022-04-20 ENCOUNTER — Telehealth: Payer: Self-pay

## 2022-04-20 ENCOUNTER — Inpatient Hospital Stay (HOSPITAL_BASED_OUTPATIENT_CLINIC_OR_DEPARTMENT_OTHER): Payer: Medicare Other | Admitting: Hematology

## 2022-04-20 ENCOUNTER — Encounter: Payer: Self-pay | Admitting: Hematology

## 2022-04-20 DIAGNOSIS — Z741 Need for assistance with personal care: Secondary | ICD-10-CM | POA: Diagnosis not present

## 2022-04-20 DIAGNOSIS — M6259 Muscle wasting and atrophy, not elsewhere classified, multiple sites: Secondary | ICD-10-CM | POA: Diagnosis not present

## 2022-04-20 DIAGNOSIS — D473 Essential (hemorrhagic) thrombocythemia: Secondary | ICD-10-CM | POA: Diagnosis not present

## 2022-04-20 DIAGNOSIS — R2681 Unsteadiness on feet: Secondary | ICD-10-CM | POA: Diagnosis not present

## 2022-04-20 DIAGNOSIS — D649 Anemia, unspecified: Secondary | ICD-10-CM

## 2022-04-20 DIAGNOSIS — Z9181 History of falling: Secondary | ICD-10-CM | POA: Diagnosis not present

## 2022-04-20 DIAGNOSIS — M6281 Muscle weakness (generalized): Secondary | ICD-10-CM | POA: Diagnosis not present

## 2022-04-20 DIAGNOSIS — S42291D Other displaced fracture of upper end of right humerus, subsequent encounter for fracture with routine healing: Secondary | ICD-10-CM | POA: Diagnosis not present

## 2022-04-20 NOTE — Telephone Encounter (Signed)
LVM for [facility] [phone] & [phone]) for them to fax pt's most recent labs to [phone] (Dr. Latanya Maudlin office).  Requested if someone please contact Dr. Latanya Maudlin office if further discussion is needed.  Awaiting fax or returned telephone call.

## 2022-04-20 NOTE — Assessment & Plan Note (Signed)
-  JAK2 (+) --Diagnosed in 11/2013 with plt initially in 600k range. Due to her advanced age, she is at high risk for thrombosis secondary to ET.  -She had been taking hydrea since 2015, mostly at low dose and tolerated well. Given the good control of her ET, we stopped her hydrea since 02/05/20. If her platelets increase over 500K, I would recommend restarting hydrea. She is agreeable. -Labs reviewed, her platelet count has been normal, she has persistent mild leukocytosis, with predominant neutrophil.  She has also developed mild anemia lately. 

## 2022-04-20 NOTE — Assessment & Plan Note (Signed)
-  She has developed mild anemia since late 2023, normocytic -anemia workup showed slightly elevated reticulocyte count, normal serum iron level and TIBC, ferritin 65, normal B12 level. -She has developed chronic kidney disease with EGFR 37, she probably has a component of anemia of chronic disease due to CKD -I will check haptoglobin, LDH, and Coombs test to rule out hemolysis on her next lab  -Her anemia could certainly be related to her MPN, possible myelofibrosis given her longstanding history of ET.  I discussed the role for bone marrow biopsy, however given her advanced age, overall mild anemia, I will hold on bone marrow biopsy for now. -I recommended her to start prenatal MVI on last visit -she was recent hospitalized for right humor fracture after a fall, and her anemia got worse, down to 7's, possible related to the fracture and bleeding, although the degree of anemia is out of portion of her bleeding -will repeat CBC in next month when she is released from rehab and determine if she needs iv iron, or bone marrow biopsy

## 2022-04-20 NOTE — Progress Notes (Signed)
[facility]   Telephone:([phone] Fax:([phone]) [phone]   Clinic Follow up Note   Patient Care Team: Darrow Bussing, MD as PCP - General (Family Medicine) Cay Schillings, MD (Inactive) as Consulting Physician (Hematology) Malachy Mood, MD as Attending Physician (Hematology and Oncology)  Date of Service:  04/20/2022  I connected with Chelsea Garcia on 04/20/2022 at 12:00 PM EDT by telephone visit and verified that I am speaking with the correct person using two identifiers.  I discussed the limitations, risks, security and privacy concerns of performing an evaluation and management service by telephone and the availability of in person appointments. I also discussed with the patient that there may be a patient responsible charge related to this service. The patient expressed understanding and agreed to proceed.   Other persons participating in the visit and their role in the encounter:  Relative  Patient's location:  [facility] Provider's location:  office  CHIEF COMPLAINT: f/u of  ET   CURRENT THERAPY:  Surveillance   ASSESSMENT & PLAN:  Chelsea Garcia is a 89 y.o. female with    Essential thrombocytosis -JAK2 (+) --Diagnosed in 11/2013 with plt initially in 600k range. Due to her advanced age, she is at high risk for thrombosis secondary to ET.  -She had been taking hydrea since 2015, mostly at low dose and tolerated well. Given the good control of her ET, we stopped her hydrea since 02/05/20. If her platelets increase over 500K, I would recommend restarting hydrea. She is agreeable. -Labs reviewed, her platelet count has been normal, she has persistent mild leukocytosis, with predominant neutrophil.  She has also developed anemia lately  Anemia -She has developed mild anemia since late 2023, normocytic -anemia workup showed slightly elevated reticulocyte count, normal serum iron level and TIBC, ferritin 65, normal B12 level. -She has developed chronic kidney disease  with EGFR 37, she probably has a component of anemia of chronic disease due to CKD -I will check haptoglobin, LDH, and Coombs test to rule out hemolysis on her next lab  -Her anemia could certainly be related to her MPN, possible myelofibrosis given her longstanding history of ET.  I discussed the role for bone marrow biopsy, however given her advanced age, overall mild anemia, I will hold on bone marrow biopsy for now. -I recommended her to start prenatal MVI on last visit -she was recent hospitalized for right humor fracture after a fall, and her anemia got worse, down to 7's, possible related to the fracture and bleeding, although the degree of anemia is out of portion of her bleeding -She had a repeated lab today, will request lab results to be faxed to [place]. -will repeat CBC in next month when she is released from rehab and determine if she needs iv iron, or bone marrow biopsy.  I spoke with her daughter-in-law, due to her recent overall health declining, will hold a bone marrow biopsy for now.  If she is able to recover and go home, I will see her back in office.   PLAN: -lab reviewed and discussed with her daughter-in-law [name] -Pt is in [facility] -[name] will call me when she is ready to be discharged from rehab to home, to schedule her office visit and lab   SUMMARY OF ONCOLOGIC HISTORY: Oncology History  Essential thrombocytosis  10/01/2013 Initial Diagnosis   Essential thrombocytosis      INTERVAL HISTORY:  Chelsea Garcia was contacted for a follow up of abnormal CBC . She was last seen  by me on 02/23/2022. [facility]. Pt relative reports that she was walking  at 100 feet, but she have swollen on both legs and left thigh as of yesterday, which has set her back far as he mobility.   All other systems were reviewed with the patient and are negative.  MEDICAL HISTORY:  Past Medical History:  Diagnosis Date   Bronchiectasis    followed by dr  Irma Newnesschi ellison completed pulmonary rehan 06-02-2020   Cellulitis of right foot    recurrent cellulitis last 7 days finishes ciprofloxacin for on 11-26-2020   Colon polyps    Benign. 2 colonoscopies in past. Due now again.   Complication of anesthesia    "got dizzy in 70's"   Diarrhea    resolved now taking imodium ad prn   Dry eye 11/25/2020   both eyes   Endometriosis    required TAH when young   Essential tremor    since 40's   GERD (gastroesophageal reflux disease)    denis   Glaucoma    started in 40's both eyes   Low back pain    since 40's   Migraine    started in 50's last migraine 10 or 20 yrs ago per pt on 11-25-2020   Osteopenia    2014   Seasonal allergies    to Dust, mold and pollen.   Thrombocytosis 09/2013   hx of resolved follows up with dr Malachy Moodyan [facility]   Tick bites    she likes out door activities, 2 this year (2015)   UPJ (ureteropelvic junction) obstruction 11/25/2020   left   Urinary frequency    Wears dentures    upper   Wears glasses     SURGICAL HISTORY: Past Surgical History:  Procedure Laterality Date   CATARACT EXTRACTION Left 02/07/2013   right 2015   colonscopy     10 or 15 yrs ago per pt on 11-25-2020   CYSTOSCOPY W/ URETERAL STENT PLACEMENT Left 11/26/2020   Procedure: CYSTOSCOPY WITH BILATERAL RETROGRADE PYELOGRAM/URETERAL STENT PLACEMENT;  Surgeon: Jerilee FieldEskridge, Matthew, MD;  [facility] [place];  Service: Urology;  Laterality: Left;  ONLY NEEDS 30 MIN   CYSTOSCOPY WITH URETEROSCOPY AND STENT PLACEMENT Left 01/12/2021   Procedure: CYSTOSCOPY WITH URETEROSCOPY AND STENT EXCHANGE;  Surgeon: Jerilee FieldEskridge, Matthew, MD;  [facility] [place];  Service: Urology;  Laterality: Left;   INGUINAL HERNIA REPAIR Left 01/09/2020   Procedure: LEFT INGUINAL HERNIA REPAIR WITH MESH;  Surgeon: Harriette Bouillonornett, Thomas, MD;  Location: [facility];  Service: General;  Laterality: Left;   INSERTION OF MESH Left 01/09/2020    Procedure: INSERTION OF MESH;  Surgeon: Harriette Bouillonornett, Thomas, MD;  Location: [facility];  Service: General;  Laterality: Left;   TOTAL ABDOMINAL HYSTERECTOMY  09/10/1968   TUBAL LIGATION     yrs ago    I have reviewed the social history and family history with the patient and they are unchanged from previous note.  ALLERGIES:  is allergic to nystatin, bee pollen, pollen extract, albuterol, doxycycline, mangifera indica fruit ext (mango) [mangifera indica], mite (d. farinae), and sulfamethoxazole.  MEDICATIONS:  Current Outpatient Medications  Medication Sig Dispense Refill   acetaminophen (TYLENOL) 500 MG tablet Take 500 mg by mouth every 6 (six) hours as needed for moderate pain.     acidophilus (RISAQUAD) CAPS capsule Take 1 capsule by mouth 2 (two) times daily. 60 capsule 0   aspirin EC 81 MG tablet Take  81 mg by mouth daily. Swallow whole.     brimonidine (ALPHAGAN) 0.2 % ophthalmic solution Place 1 drop into both eyes 2 (two) times daily.     calcium carbonate (OS-CAL) 1250 (500 Ca) MG chewable tablet Chew 1 tablet by mouth daily.     docusate sodium (COLACE) 100 MG capsule Take 1 capsule (100 mg total) by mouth 2 (two) times daily. 60 capsule 0   feeding supplement, GLUCERNA SHAKE, (GLUCERNA SHAKE) LIQD Take 237 mLs by mouth 2 (two) times daily between meals.     ferrous sulfate 325 (65 FE) MG EC tablet Take 1 tablet (325 mg total) by mouth daily with breakfast. 30 tablet 0   fluticasone-salmeterol (ADVAIR DISKUS) 100-50 MCG/ACT AEPB Inhale 1 puff into the lungs 2 (two) times daily. 60 each 11   Latanoprostene Bunod (VYZULTA) 0.024 % SOLN Apply 1 drop to eye every evening. Both eyes     levothyroxine (SYNTHROID) 50 MCG tablet Take 50 mcg by mouth every morning.     Multiple Vitamin (MULTI VITAMIN) TABS Take 2 tablets by mouth daily.     Netarsudil Dimesylate (RHOPRESSA) 0.02 % SOLN Apply 1 drop to eye every evening. Both eyes     oxyCODONE (OXY IR/ROXICODONE) 5 MG immediate  release tablet Take 1 tablet (5 mg total) by mouth every 6 (six) hours as needed for moderate pain or breakthrough pain. 6 tablet 0   Polyethyl Glycol-Propyl Glycol (SYSTANE OP) Place 1 drop into both eyes daily as needed (dry eyes). Uses eye drops prn for dry eye     polyethylene glycol (MIRALAX / GLYCOLAX) 17 g packet Take 17 g by mouth daily. Hold for diarrhea 14 each 0   tamsulosin (FLOMAX) 0.4 MG CAPS capsule Take 0.4 mg by mouth at bedtime.     No current facility-administered medications for this visit.    PHYSICAL EXAMINATION: ECOG PERFORMANCE STATUS: 3 - Symptomatic, >50% confined to bed  There were no vitals filed for this visit. Wt Readings from Last 3 Encounters:  03/30/22 110 lb (49.9 kg)  02/17/22 101 lb (45.8 kg)  02/15/22 107 lb 12.8 oz (48.9 kg)     No vitals taken today, Exam not performed today  LABORATORY DATA:  I have reviewed the data as listed    Latest Ref Rng & Units 04/03/2022    2:12 AM 04/02/2022    2:25 AM 04/01/2022    6:01 AM  CBC  WBC 4.0 - 10.5 K/uL 22.2  20.2  25.7   Hemoglobin 12.0 - 15.0 g/dL 7.6  7.8  8.2   Hematocrit 36.0 - 46.0 % 24.5  25.2  26.7   Platelets 150 - 400 K/uL 281  265  263         Latest Ref Rng & Units 04/03/2022    2:12 AM 04/02/2022    2:25 AM 04/01/2022    6:01 AM  CMP  Glucose 70 - 99 mg/dL 161  096  045   BUN 8 - 23 mg/dL 27  27  31    Creatinine 0.44 - 1.00 mg/dL 4.09  8.11  9.14   Sodium 135 - 145 mmol/L 133  133  134   Potassium 3.5 - 5.1 mmol/L 4.7  4.9  4.8   Chloride 98 - 111 mmol/L 105  107  105   CO2 22 - 32 mmol/L 22  22  21    Calcium 8.9 - 10.3 mg/dL 8.2  7.9  8.2   Total Protein 6.5 - 8.1 g/dL  5.2  5.1  5.2   Total Bilirubin 0.3 - 1.2 mg/dL 0.8  0.5  0.4   Alkaline Phos 38 - 126 U/L 56  56  56   AST 15 - 41 U/L 16  17  15    ALT 0 - 44 U/L 15  15  15        RADIOGRAPHIC STUDIES: I have personally reviewed the radiological images as listed and agreed with the findings in the report. No results  found.    No orders of the defined types were placed in this encounter.  All questions were answered. The patient knows to call the clinic with any problems, questions or concerns. No barriers to learning was detected. The total time spent in the appointment was 11 minutes.     Malachy Mood, MD 04/20/2022   Carolin Coy am acting as scribe for Malachy Mood, MD.   I have reviewed the above documentation for accuracy and completeness, and I agree with the above.

## 2022-04-26 DIAGNOSIS — Z741 Need for assistance with personal care: Secondary | ICD-10-CM | POA: Diagnosis not present

## 2022-04-26 DIAGNOSIS — M6281 Muscle weakness (generalized): Secondary | ICD-10-CM | POA: Diagnosis not present

## 2022-04-26 DIAGNOSIS — R2681 Unsteadiness on feet: Secondary | ICD-10-CM | POA: Diagnosis not present

## 2022-04-26 DIAGNOSIS — M6259 Muscle wasting and atrophy, not elsewhere classified, multiple sites: Secondary | ICD-10-CM | POA: Diagnosis not present

## 2022-04-26 DIAGNOSIS — S42291D Other displaced fracture of upper end of right humerus, subsequent encounter for fracture with routine healing: Secondary | ICD-10-CM | POA: Diagnosis not present

## 2022-04-26 DIAGNOSIS — Z9181 History of falling: Secondary | ICD-10-CM | POA: Diagnosis not present

## 2022-04-29 DIAGNOSIS — Z741 Need for assistance with personal care: Secondary | ICD-10-CM | POA: Diagnosis not present

## 2022-04-29 DIAGNOSIS — Z9181 History of falling: Secondary | ICD-10-CM | POA: Diagnosis not present

## 2022-04-29 DIAGNOSIS — M6281 Muscle weakness (generalized): Secondary | ICD-10-CM | POA: Diagnosis not present

## 2022-04-29 DIAGNOSIS — M6259 Muscle wasting and atrophy, not elsewhere classified, multiple sites: Secondary | ICD-10-CM | POA: Diagnosis not present

## 2022-04-29 DIAGNOSIS — S42291D Other displaced fracture of upper end of right humerus, subsequent encounter for fracture with routine healing: Secondary | ICD-10-CM | POA: Diagnosis not present

## 2022-04-29 DIAGNOSIS — R2681 Unsteadiness on feet: Secondary | ICD-10-CM | POA: Diagnosis not present

## 2022-05-03 ENCOUNTER — Other Ambulatory Visit: Payer: Self-pay | Admitting: *Deleted

## 2022-05-03 NOTE — Patient Outreach (Signed)
[facility] Post- Acute Care Coordinator follow up. Screening for potential care coordination services as a benefit of health plan and primary care provider.  Verified in [facility] Chelsea Garcia discharged from [facility] on 05/02/22. Chelsea Garcia social worker previously indicated home health will be arranged thru [facility]. Lives with spouse and family.   Primary Care Provider office [facility] has [facility] care management. Will send notification of discharge to [facility] care management.    Chelsea Noble, MSN, RN,BSN [facility] Post Acute Care Coordinator [phone] [facility]) [phone]  (Toll free office)

## 2022-05-05 DIAGNOSIS — J302 Other seasonal allergic rhinitis: Secondary | ICD-10-CM | POA: Diagnosis not present

## 2022-05-05 DIAGNOSIS — S51001D Unspecified open wound of right elbow, subsequent encounter: Secondary | ICD-10-CM | POA: Diagnosis not present

## 2022-05-05 DIAGNOSIS — M11262 Other chondrocalcinosis, left knee: Secondary | ICD-10-CM | POA: Diagnosis not present

## 2022-05-05 DIAGNOSIS — F419 Anxiety disorder, unspecified: Secondary | ICD-10-CM | POA: Diagnosis not present

## 2022-05-05 DIAGNOSIS — Z79899 Other long term (current) drug therapy: Secondary | ICD-10-CM | POA: Diagnosis not present

## 2022-05-05 DIAGNOSIS — K219 Gastro-esophageal reflux disease without esophagitis: Secondary | ICD-10-CM | POA: Diagnosis not present

## 2022-05-05 DIAGNOSIS — L89153 Pressure ulcer of sacral region, stage 3: Secondary | ICD-10-CM | POA: Diagnosis not present

## 2022-05-05 DIAGNOSIS — Z7982 Long term (current) use of aspirin: Secondary | ICD-10-CM | POA: Diagnosis not present

## 2022-05-05 DIAGNOSIS — I872 Venous insufficiency (chronic) (peripheral): Secondary | ICD-10-CM | POA: Diagnosis not present

## 2022-05-05 DIAGNOSIS — E039 Hypothyroidism, unspecified: Secondary | ICD-10-CM | POA: Diagnosis not present

## 2022-05-05 DIAGNOSIS — D638 Anemia in other chronic diseases classified elsewhere: Secondary | ICD-10-CM | POA: Diagnosis not present

## 2022-05-05 DIAGNOSIS — G8929 Other chronic pain: Secondary | ICD-10-CM | POA: Diagnosis not present

## 2022-05-05 DIAGNOSIS — M5136 Other intervertebral disc degeneration, lumbar region: Secondary | ICD-10-CM | POA: Diagnosis not present

## 2022-05-05 DIAGNOSIS — R350 Frequency of micturition: Secondary | ICD-10-CM | POA: Diagnosis not present

## 2022-05-05 DIAGNOSIS — H409 Unspecified glaucoma: Secondary | ICD-10-CM | POA: Diagnosis not present

## 2022-05-05 DIAGNOSIS — G250 Essential tremor: Secondary | ICD-10-CM | POA: Diagnosis not present

## 2022-05-05 DIAGNOSIS — M8580 Other specified disorders of bone density and structure, unspecified site: Secondary | ICD-10-CM | POA: Diagnosis not present

## 2022-05-05 DIAGNOSIS — S42211D Unspecified displaced fracture of surgical neck of right humerus, subsequent encounter for fracture with routine healing: Secondary | ICD-10-CM | POA: Diagnosis not present

## 2022-05-05 DIAGNOSIS — G43909 Migraine, unspecified, not intractable, without status migrainosus: Secondary | ICD-10-CM | POA: Diagnosis not present

## 2022-05-05 DIAGNOSIS — J479 Bronchiectasis, uncomplicated: Secondary | ICD-10-CM | POA: Diagnosis not present

## 2022-05-05 DIAGNOSIS — M419 Scoliosis, unspecified: Secondary | ICD-10-CM | POA: Diagnosis not present

## 2022-05-05 DIAGNOSIS — Z87891 Personal history of nicotine dependence: Secondary | ICD-10-CM | POA: Diagnosis not present

## 2022-05-05 DIAGNOSIS — D473 Essential (hemorrhagic) thrombocythemia: Secondary | ICD-10-CM | POA: Diagnosis not present

## 2022-05-05 DIAGNOSIS — L89323 Pressure ulcer of left buttock, stage 3: Secondary | ICD-10-CM | POA: Diagnosis not present

## 2022-05-05 DIAGNOSIS — Z9181 History of falling: Secondary | ICD-10-CM | POA: Diagnosis not present

## 2022-05-06 DIAGNOSIS — S42211D Unspecified displaced fracture of surgical neck of right humerus, subsequent encounter for fracture with routine healing: Secondary | ICD-10-CM | POA: Diagnosis not present

## 2022-05-06 DIAGNOSIS — D638 Anemia in other chronic diseases classified elsewhere: Secondary | ICD-10-CM | POA: Diagnosis not present

## 2022-05-06 DIAGNOSIS — L89153 Pressure ulcer of sacral region, stage 3: Secondary | ICD-10-CM | POA: Diagnosis not present

## 2022-05-06 DIAGNOSIS — L89323 Pressure ulcer of left buttock, stage 3: Secondary | ICD-10-CM | POA: Diagnosis not present

## 2022-05-06 DIAGNOSIS — D473 Essential (hemorrhagic) thrombocythemia: Secondary | ICD-10-CM | POA: Diagnosis not present

## 2022-05-06 DIAGNOSIS — S51001D Unspecified open wound of right elbow, subsequent encounter: Secondary | ICD-10-CM | POA: Diagnosis not present

## 2022-05-09 DIAGNOSIS — K219 Gastro-esophageal reflux disease without esophagitis: Secondary | ICD-10-CM | POA: Diagnosis not present

## 2022-05-09 DIAGNOSIS — L89323 Pressure ulcer of left buttock, stage 3: Secondary | ICD-10-CM | POA: Diagnosis not present

## 2022-05-09 DIAGNOSIS — L89153 Pressure ulcer of sacral region, stage 3: Secondary | ICD-10-CM | POA: Diagnosis not present

## 2022-05-09 DIAGNOSIS — E039 Hypothyroidism, unspecified: Secondary | ICD-10-CM | POA: Diagnosis not present

## 2022-05-09 DIAGNOSIS — L89152 Pressure ulcer of sacral region, stage 2: Secondary | ICD-10-CM | POA: Diagnosis not present

## 2022-05-09 DIAGNOSIS — D473 Essential (hemorrhagic) thrombocythemia: Secondary | ICD-10-CM | POA: Diagnosis not present

## 2022-05-09 DIAGNOSIS — J4520 Mild intermittent asthma, uncomplicated: Secondary | ICD-10-CM | POA: Diagnosis not present

## 2022-05-09 DIAGNOSIS — S51001D Unspecified open wound of right elbow, subsequent encounter: Secondary | ICD-10-CM | POA: Diagnosis not present

## 2022-05-09 DIAGNOSIS — S42211D Unspecified displaced fracture of surgical neck of right humerus, subsequent encounter for fracture with routine healing: Secondary | ICD-10-CM | POA: Diagnosis not present

## 2022-05-09 DIAGNOSIS — D638 Anemia in other chronic diseases classified elsewhere: Secondary | ICD-10-CM | POA: Diagnosis not present

## 2022-05-11 DIAGNOSIS — L89323 Pressure ulcer of left buttock, stage 3: Secondary | ICD-10-CM | POA: Diagnosis not present

## 2022-05-11 DIAGNOSIS — S42221D 2-part displaced fracture of surgical neck of right humerus, subsequent encounter for fracture with routine healing: Secondary | ICD-10-CM | POA: Diagnosis not present

## 2022-05-11 DIAGNOSIS — D473 Essential (hemorrhagic) thrombocythemia: Secondary | ICD-10-CM | POA: Diagnosis not present

## 2022-05-11 DIAGNOSIS — S51001D Unspecified open wound of right elbow, subsequent encounter: Secondary | ICD-10-CM | POA: Diagnosis not present

## 2022-05-11 DIAGNOSIS — D638 Anemia in other chronic diseases classified elsewhere: Secondary | ICD-10-CM | POA: Diagnosis not present

## 2022-05-11 DIAGNOSIS — S42211D Unspecified displaced fracture of surgical neck of right humerus, subsequent encounter for fracture with routine healing: Secondary | ICD-10-CM | POA: Diagnosis not present

## 2022-05-11 DIAGNOSIS — L89153 Pressure ulcer of sacral region, stage 3: Secondary | ICD-10-CM | POA: Diagnosis not present

## 2022-05-12 ENCOUNTER — Other Ambulatory Visit: Payer: Self-pay | Admitting: Orthopedic Surgery

## 2022-05-12 DIAGNOSIS — D473 Essential (hemorrhagic) thrombocythemia: Secondary | ICD-10-CM | POA: Diagnosis not present

## 2022-05-12 DIAGNOSIS — S42211D Unspecified displaced fracture of surgical neck of right humerus, subsequent encounter for fracture with routine healing: Secondary | ICD-10-CM | POA: Diagnosis not present

## 2022-05-12 DIAGNOSIS — L89323 Pressure ulcer of left buttock, stage 3: Secondary | ICD-10-CM | POA: Diagnosis not present

## 2022-05-12 DIAGNOSIS — D638 Anemia in other chronic diseases classified elsewhere: Secondary | ICD-10-CM | POA: Diagnosis not present

## 2022-05-12 DIAGNOSIS — S42221D 2-part displaced fracture of surgical neck of right humerus, subsequent encounter for fracture with routine healing: Secondary | ICD-10-CM

## 2022-05-12 DIAGNOSIS — S51001D Unspecified open wound of right elbow, subsequent encounter: Secondary | ICD-10-CM | POA: Diagnosis not present

## 2022-05-12 DIAGNOSIS — L89153 Pressure ulcer of sacral region, stage 3: Secondary | ICD-10-CM | POA: Diagnosis not present

## 2022-05-14 ENCOUNTER — Inpatient Hospital Stay (HOSPITAL_COMMUNITY)
Admission: EM | Admit: 2022-05-14 | Discharge: 2022-06-11 | DRG: 853 | Disposition: E | Payer: Medicare Other | Attending: Internal Medicine | Admitting: Internal Medicine

## 2022-05-14 ENCOUNTER — Emergency Department (HOSPITAL_COMMUNITY): Payer: Medicare Other | Admitting: Certified Registered"

## 2022-05-14 ENCOUNTER — Emergency Department (HOSPITAL_COMMUNITY): Payer: Medicare Other

## 2022-05-14 ENCOUNTER — Encounter (HOSPITAL_COMMUNITY): Payer: Self-pay | Admitting: *Deleted

## 2022-05-14 ENCOUNTER — Other Ambulatory Visit: Payer: Self-pay

## 2022-05-14 ENCOUNTER — Encounter (HOSPITAL_COMMUNITY): Admission: EM | Disposition: E | Payer: Self-pay | Source: Home / Self Care

## 2022-05-14 DIAGNOSIS — J9601 Acute respiratory failure with hypoxia: Secondary | ICD-10-CM | POA: Diagnosis not present

## 2022-05-14 DIAGNOSIS — R601 Generalized edema: Secondary | ICD-10-CM

## 2022-05-14 DIAGNOSIS — R162 Hepatomegaly with splenomegaly, not elsewhere classified: Secondary | ICD-10-CM | POA: Diagnosis present

## 2022-05-14 DIAGNOSIS — I700 Atherosclerosis of aorta: Secondary | ICD-10-CM | POA: Diagnosis not present

## 2022-05-14 DIAGNOSIS — G250 Essential tremor: Secondary | ICD-10-CM | POA: Diagnosis present

## 2022-05-14 DIAGNOSIS — K766 Portal hypertension: Secondary | ICD-10-CM | POA: Diagnosis present

## 2022-05-14 DIAGNOSIS — Z7951 Long term (current) use of inhaled steroids: Secondary | ICD-10-CM

## 2022-05-14 DIAGNOSIS — Z881 Allergy status to other antibiotic agents status: Secondary | ICD-10-CM

## 2022-05-14 DIAGNOSIS — D75839 Thrombocytosis, unspecified: Secondary | ICD-10-CM | POA: Diagnosis not present

## 2022-05-14 DIAGNOSIS — S42302A Unspecified fracture of shaft of humerus, left arm, initial encounter for closed fracture: Secondary | ICD-10-CM | POA: Insufficient documentation

## 2022-05-14 DIAGNOSIS — D649 Anemia, unspecified: Secondary | ICD-10-CM | POA: Diagnosis not present

## 2022-05-14 DIAGNOSIS — B9620 Unspecified Escherichia coli [E. coli] as the cause of diseases classified elsewhere: Secondary | ICD-10-CM | POA: Diagnosis present

## 2022-05-14 DIAGNOSIS — Z9049 Acquired absence of other specified parts of digestive tract: Secondary | ICD-10-CM | POA: Diagnosis not present

## 2022-05-14 DIAGNOSIS — Z9889 Other specified postprocedural states: Secondary | ICD-10-CM | POA: Diagnosis not present

## 2022-05-14 DIAGNOSIS — K659 Peritonitis, unspecified: Secondary | ICD-10-CM | POA: Diagnosis present

## 2022-05-14 DIAGNOSIS — Z539 Procedure and treatment not carried out, unspecified reason: Secondary | ICD-10-CM | POA: Diagnosis present

## 2022-05-14 DIAGNOSIS — R198 Other specified symptoms and signs involving the digestive system and abdomen: Principal | ICD-10-CM

## 2022-05-14 DIAGNOSIS — I129 Hypertensive chronic kidney disease with stage 1 through stage 4 chronic kidney disease, or unspecified chronic kidney disease: Secondary | ICD-10-CM | POA: Diagnosis present

## 2022-05-14 DIAGNOSIS — R627 Adult failure to thrive: Secondary | ICD-10-CM | POA: Diagnosis present

## 2022-05-14 DIAGNOSIS — J90 Pleural effusion, not elsewhere classified: Secondary | ICD-10-CM | POA: Diagnosis not present

## 2022-05-14 DIAGNOSIS — I872 Venous insufficiency (chronic) (peripheral): Secondary | ICD-10-CM | POA: Diagnosis present

## 2022-05-14 DIAGNOSIS — N136 Pyonephrosis: Secondary | ICD-10-CM | POA: Diagnosis present

## 2022-05-14 DIAGNOSIS — Z882 Allergy status to sulfonamides status: Secondary | ICD-10-CM

## 2022-05-14 DIAGNOSIS — R54 Age-related physical debility: Secondary | ICD-10-CM | POA: Diagnosis present

## 2022-05-14 DIAGNOSIS — Z6828 Body mass index (BMI) 28.0-28.9, adult: Secondary | ICD-10-CM

## 2022-05-14 DIAGNOSIS — R64 Cachexia: Secondary | ICD-10-CM | POA: Diagnosis present

## 2022-05-14 DIAGNOSIS — J479 Bronchiectasis, uncomplicated: Secondary | ICD-10-CM | POA: Diagnosis present

## 2022-05-14 DIAGNOSIS — Z91030 Bee allergy status: Secondary | ICD-10-CM

## 2022-05-14 DIAGNOSIS — Z7189 Other specified counseling: Secondary | ICD-10-CM | POA: Diagnosis not present

## 2022-05-14 DIAGNOSIS — E871 Hypo-osmolality and hyponatremia: Secondary | ICD-10-CM | POA: Diagnosis not present

## 2022-05-14 DIAGNOSIS — E8809 Other disorders of plasma-protein metabolism, not elsewhere classified: Secondary | ICD-10-CM | POA: Diagnosis present

## 2022-05-14 DIAGNOSIS — Z809 Family history of malignant neoplasm, unspecified: Secondary | ICD-10-CM

## 2022-05-14 DIAGNOSIS — R571 Hypovolemic shock: Secondary | ICD-10-CM | POA: Diagnosis present

## 2022-05-14 DIAGNOSIS — D473 Essential (hemorrhagic) thrombocythemia: Secondary | ICD-10-CM | POA: Diagnosis not present

## 2022-05-14 DIAGNOSIS — B3749 Other urogenital candidiasis: Secondary | ICD-10-CM | POA: Diagnosis not present

## 2022-05-14 DIAGNOSIS — R140 Abdominal distension (gaseous): Secondary | ICD-10-CM | POA: Diagnosis not present

## 2022-05-14 DIAGNOSIS — Z515 Encounter for palliative care: Secondary | ICD-10-CM | POA: Diagnosis not present

## 2022-05-14 DIAGNOSIS — L89153 Pressure ulcer of sacral region, stage 3: Secondary | ICD-10-CM | POA: Insufficient documentation

## 2022-05-14 DIAGNOSIS — H409 Unspecified glaucoma: Secondary | ICD-10-CM | POA: Diagnosis present

## 2022-05-14 DIAGNOSIS — K828 Other specified diseases of gallbladder: Secondary | ICD-10-CM | POA: Diagnosis present

## 2022-05-14 DIAGNOSIS — L89152 Pressure ulcer of sacral region, stage 2: Secondary | ICD-10-CM | POA: Diagnosis present

## 2022-05-14 DIAGNOSIS — K5792 Diverticulitis of intestine, part unspecified, without perforation or abscess without bleeding: Secondary | ICD-10-CM

## 2022-05-14 DIAGNOSIS — N200 Calculus of kidney: Secondary | ICD-10-CM | POA: Diagnosis not present

## 2022-05-14 DIAGNOSIS — K631 Perforation of intestine (nontraumatic): Secondary | ICD-10-CM | POA: Diagnosis not present

## 2022-05-14 DIAGNOSIS — Z4659 Encounter for fitting and adjustment of other gastrointestinal appliance and device: Secondary | ICD-10-CM | POA: Diagnosis not present

## 2022-05-14 DIAGNOSIS — E872 Acidosis, unspecified: Secondary | ICD-10-CM | POA: Diagnosis present

## 2022-05-14 DIAGNOSIS — I959 Hypotension, unspecified: Secondary | ICD-10-CM | POA: Insufficient documentation

## 2022-05-14 DIAGNOSIS — K9189 Other postprocedural complications and disorders of digestive system: Secondary | ICD-10-CM | POA: Diagnosis not present

## 2022-05-14 DIAGNOSIS — S42291A Other displaced fracture of upper end of right humerus, initial encounter for closed fracture: Secondary | ICD-10-CM | POA: Diagnosis not present

## 2022-05-14 DIAGNOSIS — D638 Anemia in other chronic diseases classified elsewhere: Secondary | ICD-10-CM | POA: Diagnosis present

## 2022-05-14 DIAGNOSIS — Z91048 Other nonmedicinal substance allergy status: Secondary | ICD-10-CM

## 2022-05-14 DIAGNOSIS — E8770 Fluid overload, unspecified: Secondary | ICD-10-CM | POA: Diagnosis not present

## 2022-05-14 DIAGNOSIS — Z87891 Personal history of nicotine dependence: Secondary | ICD-10-CM | POA: Diagnosis not present

## 2022-05-14 DIAGNOSIS — L89611 Pressure ulcer of right heel, stage 1: Secondary | ICD-10-CM | POA: Diagnosis present

## 2022-05-14 DIAGNOSIS — R918 Other nonspecific abnormal finding of lung field: Secondary | ICD-10-CM | POA: Diagnosis not present

## 2022-05-14 DIAGNOSIS — N390 Urinary tract infection, site not specified: Secondary | ICD-10-CM | POA: Insufficient documentation

## 2022-05-14 DIAGNOSIS — S42201D Unspecified fracture of upper end of right humerus, subsequent encounter for fracture with routine healing: Secondary | ICD-10-CM

## 2022-05-14 DIAGNOSIS — J690 Pneumonitis due to inhalation of food and vomit: Secondary | ICD-10-CM | POA: Diagnosis not present

## 2022-05-14 DIAGNOSIS — R739 Hyperglycemia, unspecified: Secondary | ICD-10-CM | POA: Diagnosis present

## 2022-05-14 DIAGNOSIS — R109 Unspecified abdominal pain: Secondary | ICD-10-CM | POA: Diagnosis not present

## 2022-05-14 DIAGNOSIS — E43 Unspecified severe protein-calorie malnutrition: Secondary | ICD-10-CM | POA: Diagnosis present

## 2022-05-14 DIAGNOSIS — Z66 Do not resuscitate: Secondary | ICD-10-CM | POA: Diagnosis not present

## 2022-05-14 DIAGNOSIS — N189 Chronic kidney disease, unspecified: Secondary | ICD-10-CM | POA: Diagnosis present

## 2022-05-14 DIAGNOSIS — D7581 Myelofibrosis: Secondary | ICD-10-CM | POA: Diagnosis present

## 2022-05-14 DIAGNOSIS — K5720 Diverticulitis of large intestine with perforation and abscess without bleeding: Secondary | ICD-10-CM | POA: Diagnosis present

## 2022-05-14 DIAGNOSIS — K567 Ileus, unspecified: Secondary | ICD-10-CM | POA: Diagnosis not present

## 2022-05-14 DIAGNOSIS — A419 Sepsis, unspecified organism: Principal | ICD-10-CM | POA: Diagnosis present

## 2022-05-14 DIAGNOSIS — Z888 Allergy status to other drugs, medicaments and biological substances status: Secondary | ICD-10-CM

## 2022-05-14 DIAGNOSIS — D72825 Bandemia: Secondary | ICD-10-CM | POA: Diagnosis not present

## 2022-05-14 DIAGNOSIS — Z79899 Other long term (current) drug therapy: Secondary | ICD-10-CM

## 2022-05-14 DIAGNOSIS — R1084 Generalized abdominal pain: Secondary | ICD-10-CM | POA: Diagnosis not present

## 2022-05-14 DIAGNOSIS — R0600 Dyspnea, unspecified: Secondary | ICD-10-CM | POA: Diagnosis not present

## 2022-05-14 DIAGNOSIS — K5730 Diverticulosis of large intestine without perforation or abscess without bleeding: Secondary | ICD-10-CM | POA: Diagnosis not present

## 2022-05-14 DIAGNOSIS — D72829 Elevated white blood cell count, unspecified: Secondary | ICD-10-CM | POA: Insufficient documentation

## 2022-05-14 DIAGNOSIS — S42301A Unspecified fracture of shaft of humerus, right arm, initial encounter for closed fracture: Secondary | ICD-10-CM | POA: Insufficient documentation

## 2022-05-14 DIAGNOSIS — R609 Edema, unspecified: Secondary | ICD-10-CM | POA: Diagnosis not present

## 2022-05-14 DIAGNOSIS — K658 Other peritonitis: Secondary | ICD-10-CM | POA: Diagnosis not present

## 2022-05-14 DIAGNOSIS — N179 Acute kidney failure, unspecified: Secondary | ICD-10-CM | POA: Diagnosis present

## 2022-05-14 DIAGNOSIS — K219 Gastro-esophageal reflux disease without esophagitis: Secondary | ICD-10-CM | POA: Diagnosis present

## 2022-05-14 DIAGNOSIS — R062 Wheezing: Secondary | ICD-10-CM | POA: Diagnosis not present

## 2022-05-14 DIAGNOSIS — K668 Other specified disorders of peritoneum: Secondary | ICD-10-CM | POA: Diagnosis not present

## 2022-05-14 DIAGNOSIS — Z79890 Hormone replacement therapy (postmenopausal): Secondary | ICD-10-CM

## 2022-05-14 DIAGNOSIS — S42294D Other nondisplaced fracture of upper end of right humerus, subsequent encounter for fracture with routine healing: Secondary | ICD-10-CM | POA: Diagnosis not present

## 2022-05-14 DIAGNOSIS — Z8349 Family history of other endocrine, nutritional and metabolic diseases: Secondary | ICD-10-CM

## 2022-05-14 DIAGNOSIS — L8990 Pressure ulcer of unspecified site, unspecified stage: Secondary | ICD-10-CM | POA: Insufficient documentation

## 2022-05-14 DIAGNOSIS — Z9102 Food additives allergy status: Secondary | ICD-10-CM

## 2022-05-14 DIAGNOSIS — R188 Other ascites: Secondary | ICD-10-CM | POA: Diagnosis present

## 2022-05-14 DIAGNOSIS — B964 Proteus (mirabilis) (morganii) as the cause of diseases classified elsewhere: Secondary | ICD-10-CM | POA: Diagnosis present

## 2022-05-14 DIAGNOSIS — K7460 Unspecified cirrhosis of liver: Secondary | ICD-10-CM | POA: Diagnosis present

## 2022-05-14 DIAGNOSIS — I10 Essential (primary) hypertension: Secondary | ICD-10-CM | POA: Diagnosis not present

## 2022-05-14 DIAGNOSIS — R6520 Severe sepsis without septic shock: Secondary | ICD-10-CM | POA: Diagnosis present

## 2022-05-14 HISTORY — PX: BOWEL RESECTION: SHX1257

## 2022-05-14 HISTORY — PX: LAPAROTOMY: SHX154

## 2022-05-14 LAB — COMPREHENSIVE METABOLIC PANEL
ALT: 9 U/L (ref 0–44)
AST: 14 U/L — ABNORMAL LOW (ref 15–41)
Albumin: 3.3 g/dL — ABNORMAL LOW (ref 3.5–5.0)
Alkaline Phosphatase: 74 U/L (ref 38–126)
Anion gap: 12 (ref 5–15)
BUN: 36 mg/dL — ABNORMAL HIGH (ref 8–23)
CO2: 21 mmol/L — ABNORMAL LOW (ref 22–32)
Calcium: 9.1 mg/dL (ref 8.9–10.3)
Chloride: 104 mmol/L (ref 98–111)
Creatinine, Ser: 1.53 mg/dL — ABNORMAL HIGH (ref 0.44–1.00)
GFR, Estimated: 33 mL/min — ABNORMAL LOW (ref >60)
Glucose, Bld: 116 mg/dL — ABNORMAL HIGH (ref 70–99)
Potassium: 4.4 mmol/L (ref 3.5–5.1)
Sodium: 137 mmol/L (ref 135–145)
Total Bilirubin: 0.6 mg/dL (ref 0.3–1.2)
Total Protein: 6.5 g/dL (ref 6.5–8.1)

## 2022-05-14 LAB — URINALYSIS, W/ REFLEX TO CULTURE (INFECTION SUSPECTED)
Bacteria, UA: NONE SEEN
Bilirubin Urine: NEGATIVE
Glucose, UA: NEGATIVE mg/dL
Ketones, ur: NEGATIVE mg/dL
Nitrite: NEGATIVE
Protein, ur: 100 mg/dL — AB
Specific Gravity, Urine: 1.017 (ref 1.005–1.030)
pH: 5 (ref 5.0–8.0)

## 2022-05-14 LAB — CBC WITH DIFFERENTIAL/PLATELET
Abs Immature Granulocytes: 0 10*3/uL (ref 0.00–0.07)
Basophils Absolute: 0 10*3/uL (ref 0.0–0.1)
Basophils Relative: 0 %
Eosinophils Absolute: 0 10*3/uL (ref 0.0–0.5)
Eosinophils Relative: 0 %
HCT: 35.9 % — ABNORMAL LOW (ref 36.0–46.0)
Hemoglobin: 11.1 g/dL — ABNORMAL LOW (ref 12.0–15.0)
Lymphocytes Relative: 0 %
Lymphs Abs: 0 10*3/uL — ABNORMAL LOW (ref 0.7–4.0)
MCH: 29.8 pg (ref 26.0–34.0)
MCHC: 30.9 g/dL (ref 30.0–36.0)
MCV: 96.5 fL (ref 80.0–100.0)
Monocytes Absolute: 2.4 10*3/uL — ABNORMAL HIGH (ref 0.1–1.0)
Monocytes Relative: 5 %
Neutro Abs: 46.2 10*3/uL — ABNORMAL HIGH (ref 1.7–7.7)
Neutrophils Relative %: 95 %
Platelets: 254 10*3/uL (ref 150–400)
RBC: 3.72 MIL/uL — ABNORMAL LOW (ref 3.87–5.11)
RDW: 16.4 % — ABNORMAL HIGH (ref 11.5–15.5)
WBC: 48.6 10*3/uL — ABNORMAL HIGH (ref 4.0–10.5)
nRBC: 0 % (ref 0.0–0.2)
nRBC: 1 /100 WBC — ABNORMAL HIGH

## 2022-05-14 LAB — PROTIME-INR
INR: 1.4 — ABNORMAL HIGH (ref 0.8–1.2)
Prothrombin Time: 17.2 seconds — ABNORMAL HIGH (ref 11.4–15.2)

## 2022-05-14 LAB — LACTIC ACID, PLASMA: Lactic Acid, Venous: 2 mmol/L (ref 0.5–1.9)

## 2022-05-14 LAB — APTT: aPTT: 42 seconds — ABNORMAL HIGH (ref 24–36)

## 2022-05-14 SURGERY — LAPAROTOMY, EXPLORATORY
Anesthesia: General | Site: Abdomen

## 2022-05-14 MED ORDER — ONDANSETRON HCL 4 MG/2ML IJ SOLN
4.0000 mg | Freq: Once | INTRAMUSCULAR | Status: AC
  Administered 2022-05-14: 4 mg via INTRAVENOUS
  Filled 2022-05-14: qty 2

## 2022-05-14 MED ORDER — ROCURONIUM BROMIDE 10 MG/ML (PF) SYRINGE
PREFILLED_SYRINGE | INTRAVENOUS | Status: AC
  Filled 2022-05-14: qty 10

## 2022-05-14 MED ORDER — LIDOCAINE 2% (20 MG/ML) 5 ML SYRINGE
INTRAMUSCULAR | Status: DC | PRN
  Administered 2022-05-14: 40 mg via INTRAVENOUS

## 2022-05-14 MED ORDER — FENTANYL CITRATE PF 50 MCG/ML IJ SOSY
37.5000 ug | PREFILLED_SYRINGE | Freq: Once | INTRAMUSCULAR | Status: AC
  Administered 2022-05-14: 37.5 ug via INTRAVENOUS
  Filled 2022-05-14: qty 1

## 2022-05-14 MED ORDER — PHENYLEPHRINE HCL-NACL 20-0.9 MG/250ML-% IV SOLN
INTRAVENOUS | Status: DC | PRN
  Administered 2022-05-14: 50 ug/min via INTRAVENOUS

## 2022-05-14 MED ORDER — METRONIDAZOLE 500 MG/100ML IV SOLN
500.0000 mg | Freq: Once | INTRAVENOUS | Status: AC
  Administered 2022-05-14: 500 mg via INTRAVENOUS
  Filled 2022-05-14: qty 100

## 2022-05-14 MED ORDER — LIDOCAINE 2% (20 MG/ML) 5 ML SYRINGE
INTRAMUSCULAR | Status: AC
  Filled 2022-05-14: qty 5

## 2022-05-14 MED ORDER — PHENYLEPHRINE 80 MCG/ML (10ML) SYRINGE FOR IV PUSH (FOR BLOOD PRESSURE SUPPORT)
PREFILLED_SYRINGE | INTRAVENOUS | Status: AC
  Filled 2022-05-14: qty 10

## 2022-05-14 MED ORDER — LACTATED RINGERS IV SOLN: INTRAVENOUS | Status: DC | PRN

## 2022-05-14 MED ORDER — 0.9 % SODIUM CHLORIDE (POUR BTL) OPTIME
TOPICAL | Status: DC | PRN
  Administered 2022-05-14: 3000 mL

## 2022-05-14 MED ORDER — DEXAMETHASONE SODIUM PHOSPHATE 10 MG/ML IJ SOLN
INTRAMUSCULAR | Status: DC | PRN
  Administered 2022-05-14: 10 mg via INTRAVENOUS

## 2022-05-14 MED ORDER — ALBUMIN HUMAN 5 % IV SOLN: INTRAVENOUS | Status: DC | PRN

## 2022-05-14 MED ORDER — PROPOFOL 10 MG/ML IV BOLUS
INTRAVENOUS | Status: AC
  Filled 2022-05-14: qty 20

## 2022-05-14 MED ORDER — SODIUM CHLORIDE 0.9 % IV SOLN: 2.0000 g | INTRAVENOUS | Status: DC

## 2022-05-14 MED ORDER — FENTANYL CITRATE (PF) 250 MCG/5ML IJ SOLN
INTRAMUSCULAR | Status: DC | PRN
  Administered 2022-05-14 (×2): 50 ug via INTRAVENOUS
  Administered 2022-05-14: 100 ug via INTRAVENOUS
  Administered 2022-05-14: 50 ug via INTRAVENOUS

## 2022-05-14 MED ORDER — SUCCINYLCHOLINE CHLORIDE 200 MG/10ML IV SOSY
PREFILLED_SYRINGE | INTRAVENOUS | Status: AC
  Filled 2022-05-14: qty 10

## 2022-05-14 MED ORDER — MORPHINE SULFATE (PF) 4 MG/ML IV SOLN
4.0000 mg | Freq: Once | INTRAVENOUS | Status: AC
  Administered 2022-05-14: 4 mg via INTRAVENOUS
  Filled 2022-05-14: qty 1

## 2022-05-14 MED ORDER — ONDANSETRON HCL 4 MG/2ML IJ SOLN
INTRAMUSCULAR | Status: AC
  Filled 2022-05-14: qty 2

## 2022-05-14 MED ORDER — SODIUM CHLORIDE 0.9 % IV BOLUS
500.0000 mL | Freq: Once | INTRAVENOUS | Status: AC
  Administered 2022-05-14: 500 mL via INTRAVENOUS

## 2022-05-14 MED ORDER — ROCURONIUM BROMIDE 10 MG/ML (PF) SYRINGE
PREFILLED_SYRINGE | INTRAVENOUS | Status: DC | PRN
  Administered 2022-05-14: 70 mg via INTRAVENOUS

## 2022-05-14 MED ORDER — LACTATED RINGERS IV SOLN: INTRAVENOUS | Status: DC

## 2022-05-14 MED ORDER — LACTATED RINGERS IV BOLUS
1000.0000 mL | Freq: Once | INTRAVENOUS | Status: AC
  Administered 2022-05-14: 1000 mL via INTRAVENOUS

## 2022-05-14 MED ORDER — DEXAMETHASONE SODIUM PHOSPHATE 10 MG/ML IJ SOLN
INTRAMUSCULAR | Status: AC
  Filled 2022-05-14: qty 1

## 2022-05-14 MED ORDER — PROPOFOL 10 MG/ML IV BOLUS
INTRAVENOUS | Status: DC | PRN
  Administered 2022-05-14: 90 mg via INTRAVENOUS

## 2022-05-14 MED ORDER — ONDANSETRON HCL 4 MG/2ML IJ SOLN
INTRAMUSCULAR | Status: DC | PRN
  Administered 2022-05-14: 4 mg via INTRAVENOUS

## 2022-05-14 MED ORDER — FENTANYL CITRATE (PF) 250 MCG/5ML IJ SOLN
INTRAMUSCULAR | Status: AC
  Filled 2022-05-14: qty 5

## 2022-05-14 MED ORDER — IOHEXOL 350 MG/ML SOLN
50.0000 mL | Freq: Once | INTRAVENOUS | Status: AC | PRN
  Administered 2022-05-14: 50 mL via INTRAVENOUS

## 2022-05-14 MED ORDER — SUCCINYLCHOLINE CHLORIDE 200 MG/10ML IV SOSY
PREFILLED_SYRINGE | INTRAVENOUS | Status: DC | PRN
  Administered 2022-05-14: 100 mg via INTRAVENOUS

## 2022-05-14 MED ORDER — SODIUM CHLORIDE 0.9 % IV SOLN
2.0000 g | Freq: Once | INTRAVENOUS | Status: AC
  Administered 2022-05-14: 2 g via INTRAVENOUS
  Filled 2022-05-14: qty 12.5

## 2022-05-14 SURGICAL SUPPLY — 49 items
BAG COUNTER SPONGE SURGICOUNT (BAG) ×2 IMPLANT
BLADE CLIPPER SURG (BLADE) IMPLANT
CANISTER SUCT 3000ML PPV (MISCELLANEOUS) ×2 IMPLANT
CHLORAPREP W/TINT 26 (MISCELLANEOUS) ×2 IMPLANT
COVER SURGICAL LIGHT HANDLE (MISCELLANEOUS) ×2 IMPLANT
DRAPE LAPAROSCOPIC ABDOMINAL (DRAPES) ×2 IMPLANT
DRAPE WARM FLUID 44X44 (DRAPES) ×2 IMPLANT
ELECT BLADE 4.0 EZ CLEAN MEGAD (MISCELLANEOUS) ×2
ELECT BLADE 6.5 EXT (BLADE) IMPLANT
ELECT REM PT RETURN 9FT ADLT (ELECTROSURGICAL) ×2
ELECTRODE BLDE 4.0 EZ CLN MEGD (MISCELLANEOUS) IMPLANT
ELECTRODE REM PT RTRN 9FT ADLT (ELECTROSURGICAL) ×2 IMPLANT
GAUZE SPONGE 2X2 STRL 8-PLY (GAUZE/BANDAGES/DRESSINGS) IMPLANT
GLOVE BIO SURGEON STRL SZ7.5 (GLOVE) ×2 IMPLANT
GLOVE INDICATOR 8.0 STRL GRN (GLOVE) ×2 IMPLANT
GOWN STRL REUS W/ TWL LRG LVL3 (GOWN DISPOSABLE) ×2 IMPLANT
GOWN STRL REUS W/ TWL XL LVL3 (GOWN DISPOSABLE) ×2 IMPLANT
GOWN STRL REUS W/TWL LRG LVL3 (GOWN DISPOSABLE) ×4
GOWN STRL REUS W/TWL XL LVL3 (GOWN DISPOSABLE) ×2
HANDLE SUCTION POOLE (INSTRUMENTS) ×2 IMPLANT
KIT BASIN OR (CUSTOM PROCEDURE TRAY) ×2 IMPLANT
KIT OSTOMY DRAINABLE 2.75 STR (WOUND CARE) IMPLANT
KIT TURNOVER KIT B (KITS) ×2 IMPLANT
LIGASURE IMPACT 36 18CM CVD LR (INSTRUMENTS) IMPLANT
NS IRRIG 1000ML POUR BTL (IV SOLUTION) ×4 IMPLANT
PACK GENERAL/GYN (CUSTOM PROCEDURE TRAY) ×2 IMPLANT
PAD ARMBOARD 7.5X6 YLW CONV (MISCELLANEOUS) ×2 IMPLANT
PENCIL SMOKE EVACUATOR (MISCELLANEOUS) ×2 IMPLANT
RELOAD GRN CONTOUR (ENDOMECHANICALS) ×2 IMPLANT
RELOAD STAPLE 40 GRN THCK (ENDOMECHANICALS) IMPLANT
RETRACTOR WND ALEXIS 25 LRG (MISCELLANEOUS) IMPLANT
RTRCTR WOUND ALEXIS 25CM LRG (MISCELLANEOUS) ×2
SPECIMEN JAR LARGE (MISCELLANEOUS) IMPLANT
SPONGE T-LAP 18X18 ~~LOC~~+RFID (SPONGE) IMPLANT
STAPLER CVD CUT GN 40 RELOAD (ENDOMECHANICALS) ×2 IMPLANT
STAPLER CVD CUT GRN 40 RELOAD (ENDOMECHANICALS) IMPLANT
STAPLER VISISTAT 35W (STAPLE) ×2 IMPLANT
SUCTION POOLE HANDLE (INSTRUMENTS) ×2
SUT PDS AB 1 CT1 36 (SUTURE) IMPLANT
SUT PDS AB 1 TP1 96 (SUTURE) ×4 IMPLANT
SUT PROLENE 2 0 CT2 30 (SUTURE) IMPLANT
SUT SILK 2 0 SH CR/8 (SUTURE) ×2 IMPLANT
SUT SILK 2 0 TIES 10X30 (SUTURE) ×2 IMPLANT
SUT SILK 3 0 SH CR/8 (SUTURE) ×2 IMPLANT
SUT SILK 3 0 TIES 10X30 (SUTURE) ×2 IMPLANT
SUT VIC AB 3-0 SH 8-18 (SUTURE) IMPLANT
TOWEL GREEN STERILE (TOWEL DISPOSABLE) ×2 IMPLANT
TRAY FOLEY MTR SLVR 16FR STAT (SET/KITS/TRAYS/PACK) ×2 IMPLANT
YANKAUER SUCT BULB TIP NO VENT (SUCTIONS) IMPLANT

## 2022-05-14 NOTE — Anesthesia Procedure Notes (Signed)
Procedure Name: Intubation Date/Time: 05/13/2022 10:24 PM  Performed by: Johnica Armwood T, CRNAPre-anesthesia Checklist: Patient identified, Emergency Drugs available, Suction available and Patient being monitored Patient Re-evaluated:Patient Re-evaluated prior to induction Oxygen Delivery Method: Circle system utilized Preoxygenation: Pre-oxygenation with 100% oxygen Induction Type: IV induction Ventilation: Mask ventilation without difficulty Laryngoscope Size: Mac and 3 Grade View: Grade I Tube type: Oral Tube size: 7.0 mm Number of attempts: 1 Airway Equipment and Method: Stylet and Oral airway Placement Confirmation: ETT inserted through vocal cords under direct vision, positive ETCO2 and breath sounds checked- equal and bilateral Secured at: 21 cm Tube secured with: Tape Dental Injury: Teeth and Oropharynx as per pre-operative assessment

## 2022-05-14 NOTE — Progress Notes (Signed)
Elink following for sepsis protocol. 

## 2022-05-14 NOTE — Anesthesia Preprocedure Evaluation (Addendum)
Anesthesia Evaluation  Patient identified by MRN, date of birth, ID band Patient awake    Reviewed: Allergy & Precautions, NPO status , Patient's Chart, lab work & pertinent test results  Airway Mallampati: III  TM Distance: >3 FB Neck ROM: Full    Dental  (+) Teeth Intact, Dental Advisory Given   Pulmonary former smoker   breath sounds clear to auscultation       Cardiovascular negative cardio ROS  Rhythm:Regular Rate:Normal     Neuro/Psych  Headaches  negative psych ROS   GI/Hepatic Neg liver ROS,GERD  ,,  Endo/Other  negative endocrine ROS    Renal/GU Renal disease     Musculoskeletal negative musculoskeletal ROS (+)    Abdominal   Peds  Hematology negative hematology ROS (+)   Anesthesia Other Findings   Reproductive/Obstetrics                             Anesthesia Physical Anesthesia Plan  ASA: 3 and emergent  Anesthesia Plan: General   Post-op Pain Management: Ofirmev IV (intra-op)*   Induction: Intravenous, Rapid sequence and Cricoid pressure planned  PONV Risk Score and Plan: 4 or greater and Ondansetron and Treatment may vary due to age or medical condition  Airway Management Planned: Oral ETT  Additional Equipment: None  Intra-op Plan:   Post-operative Plan: Extubation in OR  Informed Consent: I have reviewed the patients History and Physical, chart, labs and discussed the procedure including the risks, benefits and alternatives for the proposed anesthesia with the patient or authorized representative who has indicated his/her understanding and acceptance.     Dental advisory given  Plan Discussed with: CRNA  Anesthesia Plan Comments:        Anesthesia Quick Evaluation

## 2022-05-14 NOTE — ED Provider Notes (Signed)
[place] EMERGENCY DEPARTMENT AT [facility] Provider Note   CSN: [id number] Arrival date & time: 05/16/2022  1432     History  Chief Complaint  Patient presents with   Abdominal Pain    Chelsea Garcia is a 89 y.o. female.  HPI     89 year old female with past medical history of essential thrombocytosis, chronic venous sufficiency comes in with chief complaint of abdominal pain.  Patient states that she had an episode of abdominal pain yesterday.  The abdominal pain was severe, lasted for an hour or so and then resolved.  This morning however she started having abdominal pain around 7:30 AM.  The pain has been constant, and over time they have noticed increased abdominal distention.  They were seen by their primary care team, and advised to come to the emergency room.  Patient states that the pain is severe.  Pain is nonradiating.  She has no appetite.  Patient's daughter-in-law, who has power of attorney is also at the bedside.  Home Medications Prior to Admission medications   Medication Sig Start Date End Date Taking? Authorizing Provider  acetaminophen (TYLENOL) 500 MG tablet Take 500 mg by mouth every 6 (six) hours as needed for moderate pain.   Yes [provider]  aspirin EC 81 MG tablet Take 81 mg by mouth daily. Swallow whole.   Yes [provider]  brimonidine (ALPHAGAN) 0.2 % ophthalmic solution Place 1 drop into both eyes 2 (two) times daily. 08/17/20  Yes [provider]  calcium carbonate (OS-CAL) 1250 (500 Ca) MG chewable tablet Chew 1 tablet by mouth daily.   Yes [provider]  Docusate Sodium (DSS) 100 MG CAPS Take 1 capsule by mouth daily.   Yes [provider]  ferrous sulfate 325 (65 FE) MG EC tablet Take 1 tablet (325 mg total) by mouth daily with breakfast. 04/04/22 04/04/23 Yes Jerald Kief, MD  fluticasone-salmeterol (ADVAIR DISKUS) 100-50 MCG/ACT AEPB Inhale 1 puff into the lungs 2 (two) times daily.  02/15/22  Yes Luciano Cutter, MD  Latanoprostene Bunod (VYZULTA) 0.024 % SOLN Apply 1 drop to eye every evening. Both eyes   Yes [provider]  levothyroxine (SYNTHROID) 50 MCG tablet Take 50 mcg by mouth every morning.   Yes [provider]  Netarsudil Dimesylate (RHOPRESSA) 0.02 % SOLN Apply 1 drop to eye every evening. Both eyes   Yes [provider]  oxyCODONE (OXY IR/ROXICODONE) 5 MG immediate release tablet Take 1 tablet (5 mg total) by mouth every 6 (six) hours as needed for moderate pain or breakthrough pain. 04/01/22  Yes Jerald Kief, MD  Polyethyl Glycol-Propyl Glycol (SYSTANE OP) Place 1 drop into both eyes daily as needed (dry eyes). Uses eye drops prn for dry eye   Yes [provider]  tamsulosin (FLOMAX) 0.4 MG CAPS capsule Take 0.4 mg by mouth at bedtime. 02/08/22  Yes [provider]  feeding supplement, GLUCERNA SHAKE, (GLUCERNA SHAKE) LIQD Take 237 mLs by mouth 2 (two) times daily between meals.    [provider]  polyethylene glycol (MIRALAX / GLYCOLAX) 17 g packet Take 17 g by mouth daily. Hold for diarrhea Patient not taking: Reported on 05/25/2022 04/04/22   Jerald Kief, MD      Allergies    Nystatin, Bee pollen, Pollen extract, Albuterol, Doxycycline, Mangifera indica fruit ext (mango) [mangifera indica], Mite (d. farinae), and Sulfamethoxazole    Review of Systems   Review of Systems  All  other systems reviewed and are negative.   Physical Exam Updated Vital Signs BP (!) 103/52   Pulse 94   Temp (!) 97.4 F (36.3 C) (Oral)   Resp (!) 21   Ht 4\' 10"  (1.473 m)   Wt 49.9 kg   SpO2 98%   BMI 22.99 kg/m  Physical Exam Vitals and nursing note reviewed.  Constitutional:      Appearance: She is well-developed.  HENT:     Head: Normocephalic and atraumatic.  Eyes:     Extraocular Movements: Extraocular movements intact.  Cardiovascular:     Rate and Rhythm: Normal rate.     Heart sounds: Normal heart  sounds.  Pulmonary:     Effort: Pulmonary effort is normal. No respiratory distress.  Abdominal:     General: There is distension.     Tenderness: There is generalized abdominal tenderness. There is guarding.     Comments: Abdomen is firm, there is diffuse tenderness with palpation with guarding  Musculoskeletal:     Cervical back: Neck supple.  Skin:    General: Skin is warm and dry.  Neurological:     Mental Status: She is alert and oriented to person, place, and time.     ED Results / Procedures / Treatments   Labs (all labs ordered are listed, but only abnormal results are displayed) Labs Reviewed  LACTIC ACID, PLASMA - Abnormal; Notable for the following components:      Result Value   Lactic Acid, Venous 2.0 (*)    All other components within normal limits  COMPREHENSIVE METABOLIC PANEL - Abnormal; Notable for the following components:   CO2 21 (*)    Glucose, Bld 116 (*)    BUN 36 (*)    Creatinine, Ser 1.53 (*)    Albumin 3.3 (*)    AST 14 (*)    GFR, Estimated 33 (*)    All other components within normal limits  CBC WITH DIFFERENTIAL/PLATELET - Abnormal; Notable for the following components:   WBC 48.6 (*)    RBC 3.72 (*)    Hemoglobin 11.1 (*)    HCT 35.9 (*)    RDW 16.4 (*)    Neutro Abs 46.2 (*)    Lymphs Abs 0.0 (*)    Monocytes Absolute 2.4 (*)    nRBC 1 (*)    All other components within normal limits  PROTIME-INR - Abnormal; Notable for the following components:   Prothrombin Time 17.2 (*)    INR 1.4 (*)    All other components within normal limits  APTT - Abnormal; Notable for the following components:   aPTT 42 (*)    All other components within normal limits  URINALYSIS, W/ REFLEX TO CULTURE (INFECTION SUSPECTED) - Abnormal; Notable for the following components:   APPearance HAZY (*)    Hgb urine dipstick MODERATE (*)    Protein, ur 100 (*)    Leukocytes,Ua SMALL (*)    All other components within normal limits  URINE CULTURE  CULTURE,  BLOOD (ROUTINE X 2)  CULTURE, BLOOD (ROUTINE X 2)  LACTIC ACID, PLASMA  I-STAT CHEM 8, ED    EKG EKG Interpretation  Date/Time:  Saturday May 14 2022 17:34:49 EDT Ventricular Rate:  94 PR Interval:  150 QRS Duration: 91 QT Interval:  334 QTC Calculation: 404 R Axis:   -24 Text Interpretation: Sinus rhythm Atrial premature complexes Borderline left axis deviation Abnormal R-wave progression, late transition Borderline T abnormalities, inferior leads No acute changes No significant  change since last tracing Confirmed by Derwood Kaplan [phone]) on 05/23/2022 7:00:46 PM  Radiology CT ABDOMEN PELVIS W CONTRAST  Result Date: 05/25/2022 CLINICAL DATA:  Abdominal pain EXAM: CT ABDOMEN AND PELVIS WITH CONTRAST TECHNIQUE: Multidetector CT imaging of the abdomen and pelvis was performed using the standard protocol following bolus administration of intravenous contrast. RADIATION DOSE REDUCTION: This exam was performed according to the departmental dose-optimization program which includes automated exposure control, adjustment of the mA and/or kV according to patient size and/or use of iterative reconstruction technique. CONTRAST:  50mL OMNIPAQUE IOHEXOL 350 MG/ML SOLN COMPARISON:  08/13/2021 FINDINGS: Lower chest: There are small bilateral pleural effusions, with dependent lower lobe consolidation favoring atelectasis. Peripheral ground-glass consolidation in the right middle lobe could be hypoventilatory, inflammatory, or infectious. The heart is enlarged without pericardial effusion. Hepatobiliary: Hepatomegaly without focal liver abnormality. Gallbladder is unremarkable. Pancreas: Unremarkable. No pancreatic ductal dilatation or surrounding inflammatory changes. Spleen: Massive splenomegaly again noted, spleen measuring greater than 22 cm in craniocaudal extent. No focal parenchymal abnormality. Adrenals/Urinary Tract: Bilateral renal calculi are again identified, measuring up to 18 mm on the right and 16  mm on the left. Bilateral extrarenal pelves are again identified. No evidence of hydronephrosis. The bladder is only minimally distended, limiting its evaluation. The adrenals are stable. Stomach/Bowel: No evidence of bowel obstruction or ileus. Diverticulosis of the colon, most pronounced distally. There is possible extraluminal gas and bowel contents within the right lower pelvis, reference images 76 through 82. There is diffuse wall thickening of the large and small bowel, nonspecific given the adjacent ascites. Vascular/Lymphatic: Portal venous hypertension manifested by enlargement of the portal vein, recanalization of the umbilical vein, upper abdominal varices, and massive splenomegaly. Atherosclerosis of the aorta and its branches. No pathologic adenopathy. Reproductive: Status post hysterectomy. No adnexal masses. Other: There is small volume ascites throughout the abdomen and pelvis. No free intraperitoneal gas. No abdominal wall hernia. Musculoskeletal: The patient is cachectic. Progressive superior endplate compression deformity at the T9 level, with less than 25% loss of height. No acute bony abnormality. Reconstructed images demonstrate no additional findings. IMPRESSION: 1. Suspected extraluminal gas and bowel contents within the right lower pelvis, adjacent to the rectosigmoid colon. Evaluation is limited without oral contrast, but distal perforated diverticulitis is suspected. 2. Hepatosplenomegaly, with persistent findings of portal venous hypertension. 3. Diffuse ascites. 4. Nonspecific diffuse bowel wall thickening. While this could be due to adjacent ascites, secondary bowel wall thickening due to diffuse peritonitis should be considered given the above findings. 5. Bilateral nonobstructing renal calculi. 6. Small bilateral pleural effusions and bibasilar atelectasis, right greater than left. Nonspecific peripheral ground-glass consolidation within the non dependent right middle lobe, which  could be hypoventilatory, inflammatory, or infectious. 7.  Aortic Atherosclerosis (ICD10-I70.0). Critical Value/emergent results were called by telephone at the time of interpretation on 05/27/2022 at 8:16 pm to provider [facility] , who verbally acknowledged these results. Electronically Signed   By: Sharlet Salina M.D.   On: 05/19/2022 20:16   DG Chest Port 1 View  Result Date: 05/18/2022 CLINICAL DATA:  Questionable sepsis, evaluate for abnormality. History of fractured right humerus for several weeks. EXAM: PORTABLE CHEST 1 VIEW COMPARISON:  CT examination dated March 30, 2022 FINDINGS: The heart is enlarged. Right basilar opacity suggesting atelectasis or infiltrate. Left lung is clear. Comminuted displaced fracture of the right humeral head and neck partially imaged. Thoracic spondylosis. IMPRESSION: 1. Right basilar opacity suggesting atelectasis or infiltrate. 2. Comminuted displaced fracture of the right humeral  head and neck partially imaged. Electronically Signed   By: Larose Hires D.O.   On: 06/04/2022 17:03    Procedures .Critical Care  Performed by: Derwood Kaplan, MD Authorized by: Derwood Kaplan, MD   Critical care provider statement:    Critical care time (minutes):  41   Critical care was necessary to treat or prevent imminent or life-threatening deterioration of the following conditions: Perforated bowel.   Critical care was time spent personally by me on the following activities:  Development of treatment plan with patient or surrogate, discussions with consultants, evaluation of patient's response to treatment, examination of patient, ordering and review of laboratory studies, ordering and review of radiographic studies, ordering and performing treatments and interventions, pulse oximetry, re-evaluation of patient's condition and review of old charts     Medications Ordered in ED Medications  lactated ringers infusion ( Intravenous New Bag/Given 05/21/2022 1750)  ceFEPIme  (MAXIPIME) 2 g in sodium chloride 0.9 % 100 mL IVPB (has no administration in time range)  lactated ringers bolus 1,000 mL (has no administration in time range)  ceFEPIme (MAXIPIME) 2 g in sodium chloride 0.9 % 100 mL IVPB (0 g Intravenous Stopped 05/23/2022 1856)  metroNIDAZOLE (FLAGYL) IVPB 500 mg (0 mg Intravenous Stopped 05/24/2022 1856)  fentaNYL (SUBLIMAZE) injection 37.5 mcg (37.5 mcg Intravenous Given 06/06/2022 1741)  ondansetron (ZOFRAN) injection 4 mg (4 mg Intravenous Given 05/16/2022 1743)  sodium chloride 0.9 % bolus 500 mL (500 mLs Intravenous New Bag/Given 06/10/2022 1739)  iohexol (OMNIPAQUE) 350 MG/ML injection 50 mL (50 mLs Intravenous Contrast Given 05/16/2022 1954)    ED Course/ Medical Decision Making/ A&P Clinical Course as of 05/15/2022 2044  Sat May 14, 2022  1900 Patient's CT scan has not been completed. I have confirmed that patient has adequate IV access.  I called the CT staff, and they will bring the patient back for CT scan as possible. [AN]  2043 CT ABDOMEN PELVIS W CONTRAST Received a call from radiologist.  Technically challenging study for them to interpret, but they seem to notice extraluminal air.  Concerns for perforated viscus.  I spoke with Dr. Cliffton Asters, general surgery.  He is scrubbing into the OR right now, we will see the patient.  Patient has received cefepime, Flagyl.  Results of the ED workup discussed with the patient.  Patient's care signed out to incoming team. [AN]    Clinical Course User Index [AN] Derwood Kaplan, MD                             Medical Decision Making Amount and/or Complexity of Data Reviewed Labs: ordered. Radiology: ordered. ECG/medicine tests: ordered.  Risk Prescription drug management. Decision regarding hospitalization.   This patient presents to the ED with chief complaint(s) of abdominal pain, abdominal distention with pertinent past medical history of essential thrombocytosis and peripheral vascular disease.The complaint  involves an extensive differential diagnosis and also carries with it a high risk of complications and morbidity.    The differential diagnosis includes : Perforated bowel, small bowel obstruction, hiatal hernia, mesenteric ischemia.  The initial plan is to get basic labs, initiate sepsis workup and start antibiotics for sepsis.  I will also order CT abdomen pelvis with contrast.  Patient has no CKD.  They have given me consent to proceed with rapid CT diagnostic workup, even before the creatinine results.  They understand that IV contrast can cause contrast-induced nephropathy.  I informed him that often  the contrast induced nephropathy will resolve within 2 or 3 days, but sometimes patient can have long-term renal complications.  They understand the complication and have given permission to proceed with CT scan.   Additional history obtained: Additional history obtained from family Records reviewed previous admission documents and recent admission to the hospital was also reviewed.  Independent labs interpretation:  The following labs were independently interpreted: Patient has leukocytosis with white count at 48.6.  Her creatinine is at 1.5, which is close to her baseline normal.  Lactic acid is 2.  Treatment and Reassessment: Patient reassessed at 7 PM.  Still has abdominal pain.  Still waiting for CT scan.  She has received IV antibiotics.    Final Clinical Impression(s) / ED Diagnoses Final diagnoses:  Perforated viscus    Rx / DC Orders ED Discharge Orders     None         Derwood Kaplan, MD 06/07/2022 2045

## 2022-05-14 NOTE — ED Provider Notes (Signed)
8:50 PM Assumed care of patient from off-going team. For more details, please see note from same day.  In brief, this is a 89 y.o. female who p/w abd pain and found to have bowel perf, likely diverticulitis. WBC 48. S/p cefepime/flagyl.  Plan/Dispo at time of sign-out & ED Course since sign-out: [ ]  surgery  BP (!) 103/52   Pulse 94   Temp (!) 97.4 F (36.3 C) (Oral)   Resp (!) 21   Ht 4\' 10"  (1.473 m)   Wt 49.9 kg   SpO2 98%   BMI 22.99 kg/m    ED Course:   Clinical Course as of 05/14/22 2050  Sat May 14, 2022  1900 Patient's CT scan has not been completed. I have confirmed that patient has adequate IV access.  I called the CT staff, and they will bring the patient back for CT scan as possible. [AN]  2043 CT ABDOMEN PELVIS W CONTRAST Received a call from radiologist.  Technically challenging study for them to interpret, but they seem to notice extraluminal air.  Concerns for perforated viscus.  I spoke with Dr. Cliffton Asters, general surgery.  He is scrubbing into the OR right now, we will see the patient.  Patient has received cefepime, Flagyl.  Results of the ED workup discussed with the patient.  Patient's care signed out to incoming team. [AN]    Clinical Course User Index [AN] Derwood Kaplan, MD    Dispo: Patient was taken to OR by surgery. ------------------------------- Vivi Barrack, MD Emergency Medicine  This note was created using dictation software, which may contain spelling or grammatical errors.   Loetta Rough, MD 05/14/22 [phone]

## 2022-05-14 NOTE — Progress Notes (Signed)
Pharmacy Antibiotic Note  Chelsea Garcia is a 89 y.o. female admitted on 06/08/2022 presenting with abdominal pain, concern for intra-abdominal infection.  Pharmacy has been consulted for cefepime dosing.  Plan: Cefepime 2g IV every 24 hours Monitor renal function, clinical progression and ability to narrow  Height: 4\' 10"  (147.3 cm) Weight: 49.9 kg (110 lb) IBW/kg (Calculated) : 40.9  Temp (24hrs), Avg:97.4 F (36.3 C), Min:97.4 F (36.3 C), Max:97.4 F (36.3 C)  Recent Labs  Lab 05/25/2022 1717  WBC 48.6*  CREATININE 1.53*    Estimated Creatinine Clearance: 18.5 mL/min (A) (by C-G formula based on SCr of 1.53 mg/dL (H)).    Allergies  Allergen Reactions   Nystatin     Swelling of lips   Bee Pollen Other (See Comments) and Cough    Mill dust, pollen. Mill dust, pollen.   Pollen Extract Cough    Mill dust, pollen.   Albuterol     Increased HR, had to go to the ER   Doxycycline Nausea And Vomiting   Mangifera Indica Fruit Ext (Mango) [Mangifera Indica] Diarrhea   Mite (D. Farinae) Other (See Comments)    General allergy   Sulfamethoxazole Other (See Comments)    Irritation in only eye drops Other Other   Daylene Posey, PharmD, [facility] Clinical Pharmacist ED Pharmacist Phone # [phone] 05/22/2022 6:19 PM

## 2022-05-14 NOTE — ED Triage Notes (Signed)
Patient presents to ed via [facility] from a doctors office c/o abd. Pain and distention onset last pm , abd. Is very swollen and tight last BM was this am stats she was taken off her dyuries recently from [place]. Patient is alert and oriented x 4

## 2022-05-14 NOTE — ED Notes (Signed)
Surgeon at bedside.  

## 2022-05-14 NOTE — H&P (Signed)
CC: Severe abdominal pain  HPI: Chelsea Garcia is an 89 y.o. female with hx of GERd, endometriosis (TAH via low midline when young), thrombocytosis (Follows with Dr. Mosetta Putt) presented the emergency room with a 1 to 2-day history of abdominal pain.  It began acutely sometime yesterday.  She describes the pain as being severe and initially appeared to have resolved but then beginning this morning came back.  The pain has been constant since that time.  She has become increasingly more distended.  She was advised to come to the emergency room.  She underwent workup and we are subsequently asked to see.  She reports the pain is severe and throughout most of her abdomen.  The pain is described as being sharp.  Her daughter-in-law who is also medical power of attorney is at bedside.  That said, she is of sound mind.  She is currently at home having recently left rehab following a right shoulder injury.  Denies any nausea or vomiting.  Denies any apparent blood in her stool.  Last colonoscopy was what sounds like some around 2010 - 2012.  Unclear findings but she reports no apparent history of polyps or cancer.  Past Medical History:  Diagnosis Date   Bronchiectasis [facility])    followed by dr Irma Newness completed pulmonary rehan 06-02-2020   Cellulitis of right foot    recurrent cellulitis last 7 days finishes ciprofloxacin for on 11-26-2020   Colon polyps    Benign. 2 colonoscopies in past. Due now again.   Complication of anesthesia    "got dizzy in 70's"   Diarrhea    resolved now taking imodium ad prn   Dry eye 11/25/2020   both eyes   Endometriosis    required TAH when young   Essential tremor    since 40's   GERD (gastroesophageal reflux disease)    denis   Glaucoma    started in 40's both eyes   Low back pain    since 40's   Migraine    started in 50's last migraine 10 or 20 yrs ago per pt on 11-25-2020   Osteopenia    2014   Seasonal allergies    to Dust, mold and pollen.    Thrombocytosis 09/2013   hx of resolved follows up with dr Malachy [facility] oncology   Tick bites    she likes out door activities, 2 this year (2015)   UPJ (ureteropelvic junction) obstruction 11/25/2020   left   Urinary frequency    Wears dentures    upper   Wears glasses     Past Surgical History:  Procedure Laterality Date   CATARACT EXTRACTION Left 02/07/2013   right 2015   colonscopy     10 or 15 yrs ago per pt on 11-25-2020   CYSTOSCOPY W/ URETERAL STENT PLACEMENT Left 11/26/2020   Procedure: CYSTOSCOPY WITH BILATERAL RETROGRADE PYELOGRAM/URETERAL STENT PLACEMENT;  Surgeon: Jerilee Field, MD;  Location: [facility];  Service: Urology;  Laterality: Left;  ONLY NEEDS 30 MIN   CYSTOSCOPY WITH URETEROSCOPY AND STENT PLACEMENT Left 01/12/2021   Procedure: CYSTOSCOPY WITH URETEROSCOPY AND STENT EXCHANGE;  Surgeon: Jerilee Field, MD;  Location: [facility];  Service: Urology;  Laterality: Left;   INGUINAL HERNIA REPAIR Left 01/09/2020   Procedure: LEFT INGUINAL HERNIA REPAIR WITH MESH;  Surgeon: Harriette Bouillon, MD;  Location: [facility];  Service: General;  Laterality: Left;   INSERTION OF MESH Left 01/09/2020   Procedure: INSERTION  OF MESH;  Surgeon: Harriette Bouillon, MD;  Location: [facility];  Service: General;  Laterality: Left;   TOTAL ABDOMINAL HYSTERECTOMY  09/10/1968   TUBAL LIGATION     yrs ago    Family History  Problem Relation Age of Onset   Thyroid disease Mother    Cancer Father    Cancer Maternal Aunt 89    Social:  reports that she quit smoking about 60 years ago. Her smoking use included cigarettes. She has a 8.00 pack-year smoking history. She has never used smokeless tobacco. She reports that she does not currently use alcohol. She reports that she does not use drugs.  Allergies:  Allergies  Allergen Reactions   Nystatin     Swelling of lips   Bee Pollen Other (See Comments) and Cough    Mill  dust, pollen. Mill dust, pollen.   Pollen Extract Cough    Mill dust, pollen.   Albuterol     Increased HR, had to go to the ER   Doxycycline Nausea And Vomiting   Mangifera Indica Fruit Ext (Mango) [Mangifera Indica] Diarrhea   Mite (D. Farinae) Other (See Comments)    General allergy   Sulfamethoxazole Other (See Comments)    Irritation in only eye drops Other Other    Medications: I have reviewed the patient's current medications.  Results for orders placed or performed during the hospital encounter of 06/07/2022 (from the past 48 hour(s))  Urinalysis, w/ Reflex to Culture (Infection Suspected) -Urine, Clean Catch     Status: Abnormal   Collection Time: 05/21/2022  4:21 PM  Result Value Ref Range   Specimen Source URINE, CLEAN CATCH    Color, Urine YELLOW YELLOW   APPearance HAZY (A) CLEAR   Specific Gravity, Urine 1.017 1.005 - 1.030   pH 5.0 5.0 - 8.0   Glucose, UA NEGATIVE NEGATIVE mg/dL   Hgb urine dipstick MODERATE (A) NEGATIVE   Bilirubin Urine NEGATIVE NEGATIVE   Ketones, ur NEGATIVE NEGATIVE mg/dL   Protein, ur 409 (A) NEGATIVE mg/dL   Nitrite NEGATIVE NEGATIVE   Leukocytes,Ua SMALL (A) NEGATIVE   RBC / HPF 6-10 0 - 5 RBC/hpf   WBC, UA 11-20 0 - 5 WBC/hpf    Comment:        Reflex urine culture not performed if WBC <=10, OR if Squamous epithelial cells >5. If Squamous epithelial cells >5 suggest recollection.    Bacteria, UA NONE SEEN NONE SEEN   Squamous Epithelial / HPF 0-5 0 - 5 /HPF   Mucus PRESENT     Comment: Performed at [facility], [address], [place], [place] [address]  Urine Culture     Status: None (Preliminary result)   Collection Time: 06/04/2022  4:21 PM   Specimen: Urine, Random  Result Value Ref Range   Specimen Description URINE, RANDOM    Special Requests      URINE, CLEAN CATCH Performed at [facility], [address], [place], [place] [address]    Culture PENDING    Report Status PENDING   Lactic acid, plasma      Status: Abnormal   Collection Time: 05/15/2022  5:17 PM  Result Value Ref Range   Lactic Acid, Venous 2.0 (HH) 0.5 - 1.9 mmol/L    Comment: CRITICAL RESULT CALLED TO, READ BACK BY AND VERIFIED WITH K,SWORD RN @1817  05/13/2022 E,BENTON Performed at [facility], [address], [place], [place] [address]   Comprehensive metabolic panel  Status: Abnormal   Collection Time: 06/10/2022  5:17 PM  Result Value Ref Range   Sodium 137 135 - 145 mmol/L   Potassium 4.4 3.5 - 5.1 mmol/L   Chloride 104 98 - 111 mmol/L   CO2 21 (L) 22 - 32 mmol/L   Glucose, Bld 116 (H) 70 - 99 mg/dL    Comment: Glucose reference range applies only to samples taken after fasting for at least 8 hours.   BUN 36 (H) 8 - 23 mg/dL   Creatinine, Ser 1.61 (H) 0.44 - 1.00 mg/dL   Calcium 9.1 8.9 - 09.6 mg/dL   Total Protein 6.5 6.5 - 8.1 g/dL   Albumin 3.3 (L) 3.5 - 5.0 g/dL   AST 14 (L) 15 - 41 U/L   ALT 9 0 - 44 U/L   Alkaline Phosphatase 74 38 - 126 U/L   Total Bilirubin 0.6 0.3 - 1.2 mg/dL   GFR, Estimated 33 (L) >60 mL/min    Comment: (NOTE) Calculated using the CKD-EPI Creatinine Equation (2021)    Anion gap 12 5 - 15    Comment: Performed at [facility], [address], [place], [place] [address]  CBC with Differential     Status: Abnormal   Collection Time: 05/13/2022  5:17 PM  Result Value Ref Range   WBC 48.6 (H) 4.0 - 10.5 K/uL    Comment: REPEATED TO VERIFY   RBC 3.72 (L) 3.87 - 5.11 MIL/uL   Hemoglobin 11.1 (L) 12.0 - 15.0 g/dL    Comment: REPEATED TO VERIFY   HCT 35.9 (L) 36.0 - 46.0 %   MCV 96.5 80.0 - 100.0 fL   MCH 29.8 26.0 - 34.0 pg   MCHC 30.9 30.0 - 36.0 g/dL   RDW 98.1 (H) 19.1 - 47.8 %   Platelets 254 150 - 400 K/uL    Comment: REPEATED TO VERIFY   nRBC 0.0 0.0 - 0.2 %   Neutrophils Relative % 95 %   Neutro Abs 46.2 (H) 1.7 - 7.7 K/uL   Lymphocytes Relative 0 %   Lymphs Abs 0.0 (L) 0.7 - 4.0 K/uL   Monocytes Relative 5 %   Monocytes Absolute 2.4 (H) 0.1 - 1.0 K/uL    Eosinophils Relative 0 %   Eosinophils Absolute 0.0 0.0 - 0.5 K/uL   Basophils Relative 0 %   Basophils Absolute 0.0 0.0 - 0.1 K/uL   WBC Morphology See Note     Comment: Vaculated Neutrophils   nRBC 1 (H) 0 /100 WBC   Abs Immature Granulocytes 0.00 0.00 - 0.07 K/uL   Polychromasia PRESENT     Comment: Performed at [facility], [address], [place], [place] [address]  Protime-INR     Status: Abnormal   Collection Time: 05/19/2022  5:17 PM  Result Value Ref Range   Prothrombin Time 17.2 (H) 11.4 - 15.2 seconds   INR 1.4 (H) 0.8 - 1.2    Comment: (NOTE) INR goal varies based on device and disease states. Performed at [facility], [address], [place], [place] [address]   APTT     Status: Abnormal   Collection Time: 05/13/2022  5:17 PM  Result Value Ref Range   aPTT 42 (H) 24 - 36 seconds    Comment:        IF BASELINE aPTT IS ELEVATED, SUGGEST PATIENT RISK ASSESSMENT BE USED TO DETERMINE APPROPRIATE ANTICOAGULANT THERAPY. Performed at [facility], [address], [place],  [place] [address]     CT ABDOMEN PELVIS W CONTRAST  Result Date: 06/06/2022 CLINICAL DATA:  Abdominal pain EXAM: CT ABDOMEN AND PELVIS WITH CONTRAST TECHNIQUE: Multidetector CT imaging of the abdomen and pelvis was performed using the standard protocol following bolus administration of intravenous contrast. RADIATION DOSE REDUCTION: This exam was performed according to the departmental dose-optimization program which includes automated exposure control, adjustment of the mA and/or kV according to patient size and/or use of iterative reconstruction technique. CONTRAST:  50mL OMNIPAQUE IOHEXOL 350 MG/ML SOLN COMPARISON:  08/13/2021 FINDINGS: Lower chest: There are small bilateral pleural effusions, with dependent lower lobe consolidation favoring atelectasis. Peripheral ground-glass consolidation in the right middle lobe could be hypoventilatory, inflammatory, or infectious. The heart is enlarged  without pericardial effusion. Hepatobiliary: Hepatomegaly without focal liver abnormality. Gallbladder is unremarkable. Pancreas: Unremarkable. No pancreatic ductal dilatation or surrounding inflammatory changes. Spleen: Massive splenomegaly again noted, spleen measuring greater than 22 cm in craniocaudal extent. No focal parenchymal abnormality. Adrenals/Urinary Tract: Bilateral renal calculi are again identified, measuring up to 18 mm on the right and 16 mm on the left. Bilateral extrarenal pelves are again identified. No evidence of hydronephrosis. The bladder is only minimally distended, limiting its evaluation. The adrenals are stable. Stomach/Bowel: No evidence of bowel obstruction or ileus. Diverticulosis of the colon, most pronounced distally. There is possible extraluminal gas and bowel contents within the right lower pelvis, reference images 76 through 82. There is diffuse wall thickening of the large and small bowel, nonspecific given the adjacent ascites. Vascular/Lymphatic: Portal venous hypertension manifested by enlargement of the portal vein, recanalization of the umbilical vein, upper abdominal varices, and massive splenomegaly. Atherosclerosis of the aorta and its branches. No pathologic adenopathy. Reproductive: Status post hysterectomy. No adnexal masses. Other: There is small volume ascites throughout the abdomen and pelvis. No free intraperitoneal gas. No abdominal wall hernia. Musculoskeletal: The patient is cachectic. Progressive superior endplate compression deformity at the T9 level, with less than 25% loss of height. No acute bony abnormality. Reconstructed images demonstrate no additional findings. IMPRESSION: 1. Suspected extraluminal gas and bowel contents within the right lower pelvis, adjacent to the rectosigmoid colon. Evaluation is limited without oral contrast, but distal perforated diverticulitis is suspected. 2. Hepatosplenomegaly, with persistent findings of portal venous  hypertension. 3. Diffuse ascites. 4. Nonspecific diffuse bowel wall thickening. While this could be due to adjacent ascites, secondary bowel wall thickening due to diffuse peritonitis should be considered given the above findings. 5. Bilateral nonobstructing renal calculi. 6. Small bilateral pleural effusions and bibasilar atelectasis, right greater than left. Nonspecific peripheral ground-glass consolidation within the non dependent right middle lobe, which could be hypoventilatory, inflammatory, or infectious. 7.  Aortic Atherosclerosis (ICD10-I70.0). Critical Value/emergent results were called by telephone at the time of interpretation on 05/25/2022 at 8:16 pm to provider [facility] , who verbally acknowledged these results. Electronically Signed   By: Sharlet Salina M.D.   On: 05/12/2022 20:16   DG Chest Port 1 View  Result Date: 05/25/2022 CLINICAL DATA:  Questionable sepsis, evaluate for abnormality. History of fractured right humerus for several weeks. EXAM: PORTABLE CHEST 1 VIEW COMPARISON:  CT examination dated March 30, 2022 FINDINGS: The heart is enlarged. Right basilar opacity suggesting atelectasis or infiltrate. Left lung is clear. Comminuted displaced fracture of the right humeral head and neck partially imaged. Thoracic spondylosis. IMPRESSION: 1. Right basilar opacity suggesting atelectasis or infiltrate. 2. Comminuted displaced fracture of the right humeral head and neck partially imaged. Electronically Signed  By: Larose Hires D.O.   On: 05/25/2022 17:03    ROS - all of the below systems have been reviewed with the patient and positives are indicated with bold text General: chills, fever or night sweats Eyes: blurry vision or double vision ENT: epistaxis or sore throat Allergy/Immunology: itchy/watery eyes or nasal congestion Hematologic/Lymphatic: bleeding problems, blood clots or swollen lymph nodes Endocrine: temperature intolerance or unexpected weight changes Breast: new or  changing breast lumps or nipple discharge Resp: cough, shortness of breath, or wheezing CV: chest pain or dyspnea on exertion GI: as per HPI GU: dysuria, trouble voiding, or hematuria MSK: joint pain or joint stiffness Neuro: TIA or stroke symptoms Derm: pruritus and skin lesion changes Psych: anxiety and depression  PE Blood pressure (!) 111/59, pulse 98, temperature (!) 97.4 F (36.3 C), temperature source Oral, resp. rate (!) 27, height 4\' 10"  (1.473 m), weight 49.9 kg, SpO2 100 %. Constitutional: NAD; conversant Eyes: Moist conjunctiva; no lid lag; anicteric Lungs: Normal respiratory effort CV: RRR GI: Abd soft, distended, diffuse tenderness with guarding;+ hepatosplenomegaly; periumbilical varices Psychiatric: Appropriate affect; alert and oriented x3  Results for orders placed or performed during the hospital encounter of 05/18/2022 (from the past 48 hour(s))  Urinalysis, w/ Reflex to Culture (Infection Suspected) -Urine, Clean Catch     Status: Abnormal   Collection Time: 05/22/2022  4:21 PM  Result Value Ref Range   Specimen Source URINE, CLEAN CATCH    Color, Urine YELLOW YELLOW   APPearance HAZY (A) CLEAR   Specific Gravity, Urine 1.017 1.005 - 1.030   pH 5.0 5.0 - 8.0   Glucose, UA NEGATIVE NEGATIVE mg/dL   Hgb urine dipstick MODERATE (A) NEGATIVE   Bilirubin Urine NEGATIVE NEGATIVE   Ketones, ur NEGATIVE NEGATIVE mg/dL   Protein, ur 161 (A) NEGATIVE mg/dL   Nitrite NEGATIVE NEGATIVE   Leukocytes,Ua SMALL (A) NEGATIVE   RBC / HPF 6-10 0 - 5 RBC/hpf   WBC, UA 11-20 0 - 5 WBC/hpf    Comment:        Reflex urine culture not performed if WBC <=10, OR if Squamous epithelial cells >5. If Squamous epithelial cells >5 suggest recollection.    Bacteria, UA NONE SEEN NONE SEEN   Squamous Epithelial / HPF 0-5 0 - 5 /HPF   Mucus PRESENT     Comment: Performed at [facility], [address] [place], [place] [address]  Urine Culture     Status: None (Preliminary  result)   Collection Time: 06/06/2022  4:21 PM   Specimen: Urine, Random  Result Value Ref Range   Specimen Description URINE, RANDOM    Special Requests      URINE, CLEAN CATCH Performed at [facility], [address], [place], [place] [address]    Culture PENDING    Report Status PENDING   Lactic acid, plasma     Status: Abnormal   Collection Time: 05/21/2022  5:17 PM  Result Value Ref Range   Lactic Acid, Venous 2.0 (HH) 0.5 - 1.9 mmol/L    Comment: CRITICAL RESULT CALLED TO, READ BACK BY AND VERIFIED WITH K,SWORD RN @1817  05/28/2022 E,BENTON Performed at [facility], [address], [place], [place] [address]   Comprehensive metabolic panel     Status: Abnormal   Collection Time: 05/13/2022  5:17 PM  Result Value Ref Range   Sodium 137 135 - 145 mmol/L   Potassium 4.4 3.5 - 5.1 mmol/L   Chloride  104 98 - 111 mmol/L   CO2 21 (L) 22 - 32 mmol/L   Glucose, Bld 116 (H) 70 - 99 mg/dL    Comment: Glucose reference range applies only to samples taken after fasting for at least 8 hours.   BUN 36 (H) 8 - 23 mg/dL   Creatinine, Ser 1.61 (H) 0.44 - 1.00 mg/dL   Calcium 9.1 8.9 - 09.6 mg/dL   Total Protein 6.5 6.5 - 8.1 g/dL   Albumin 3.3 (L) 3.5 - 5.0 g/dL   AST 14 (L) 15 - 41 U/L   ALT 9 0 - 44 U/L   Alkaline Phosphatase 74 38 - 126 U/L   Total Bilirubin 0.6 0.3 - 1.2 mg/dL   GFR, Estimated 33 (L) >60 mL/min    Comment: (NOTE) Calculated using the CKD-EPI Creatinine Equation (2021)    Anion gap 12 5 - 15    Comment: Performed at [facility], [address], [place], [place] [address]  CBC with Differential     Status: Abnormal   Collection Time: 05/30/2022  5:17 PM  Result Value Ref Range   WBC 48.6 (H) 4.0 - 10.5 K/uL    Comment: REPEATED TO VERIFY   RBC 3.72 (L) 3.87 - 5.11 MIL/uL   Hemoglobin 11.1 (L) 12.0 - 15.0 g/dL    Comment: REPEATED TO VERIFY   HCT 35.9 (L) 36.0 - 46.0 %   MCV 96.5 80.0 - 100.0 fL   MCH 29.8 26.0 - 34.0 pg   MCHC 30.9 30.0 - 36.0 g/dL    RDW 98.1 (H) 19.1 - 15.5 %   Platelets 254 150 - 400 K/uL    Comment: REPEATED TO VERIFY   nRBC 0.0 0.0 - 0.2 %   Neutrophils Relative % 95 %   Neutro Abs 46.2 (H) 1.7 - 7.7 K/uL   Lymphocytes Relative 0 %   Lymphs Abs 0.0 (L) 0.7 - 4.0 K/uL   Monocytes Relative 5 %   Monocytes Absolute 2.4 (H) 0.1 - 1.0 K/uL   Eosinophils Relative 0 %   Eosinophils Absolute 0.0 0.0 - 0.5 K/uL   Basophils Relative 0 %   Basophils Absolute 0.0 0.0 - 0.1 K/uL   WBC Morphology See Note     Comment: Vaculated Neutrophils   nRBC 1 (H) 0 /100 WBC   Abs Immature Granulocytes 0.00 0.00 - 0.07 K/uL   Polychromasia PRESENT     Comment: Performed at [facility], [address] [place], [place] [address]  Protime-INR     Status: Abnormal   Collection Time: 05/31/2022  5:17 PM  Result Value Ref Range   Prothrombin Time 17.2 (H) 11.4 - 15.2 seconds   INR 1.4 (H) 0.8 - 1.2    Comment: (NOTE) INR goal varies based on device and disease states. Performed at [facility], [address], [place], [place] [address]   APTT     Status: Abnormal   Collection Time: 05/15/2022  5:17 PM  Result Value Ref Range   aPTT 42 (H) 24 - 36 seconds    Comment:        IF BASELINE aPTT IS ELEVATED, SUGGEST PATIENT RISK ASSESSMENT BE USED TO DETERMINE APPROPRIATE ANTICOAGULANT THERAPY. Performed at [facility], [address] [place], [place] [address]     CT ABDOMEN PELVIS W CONTRAST  Result Date: 05/30/2022 CLINICAL DATA:  Abdominal pain EXAM: CT ABDOMEN AND PELVIS WITH CONTRAST TECHNIQUE: Multidetector CT imaging of the abdomen  and pelvis was performed using the standard protocol following bolus administration of intravenous contrast. RADIATION DOSE REDUCTION: This exam was performed according to the departmental dose-optimization program which includes automated exposure control, adjustment of the mA and/or kV according to patient size and/or use of iterative reconstruction technique. CONTRAST:  50mL  OMNIPAQUE IOHEXOL 350 MG/ML SOLN COMPARISON:  08/13/2021 FINDINGS: Lower chest: There are small bilateral pleural effusions, with dependent lower lobe consolidation favoring atelectasis. Peripheral ground-glass consolidation in the right middle lobe could be hypoventilatory, inflammatory, or infectious. The heart is enlarged without pericardial effusion. Hepatobiliary: Hepatomegaly without focal liver abnormality. Gallbladder is unremarkable. Pancreas: Unremarkable. No pancreatic ductal dilatation or surrounding inflammatory changes. Spleen: Massive splenomegaly again noted, spleen measuring greater than 22 cm in craniocaudal extent. No focal parenchymal abnormality. Adrenals/Urinary Tract: Bilateral renal calculi are again identified, measuring up to 18 mm on the right and 16 mm on the left. Bilateral extrarenal pelves are again identified. No evidence of hydronephrosis. The bladder is only minimally distended, limiting its evaluation. The adrenals are stable. Stomach/Bowel: No evidence of bowel obstruction or ileus. Diverticulosis of the colon, most pronounced distally. There is possible extraluminal gas and bowel contents within the right lower pelvis, reference images 76 through 82. There is diffuse wall thickening of the large and small bowel, nonspecific given the adjacent ascites. Vascular/Lymphatic: Portal venous hypertension manifested by enlargement of the portal vein, recanalization of the umbilical vein, upper abdominal varices, and massive splenomegaly. Atherosclerosis of the aorta and its branches. No pathologic adenopathy. Reproductive: Status post hysterectomy. No adnexal masses. Other: There is small volume ascites throughout the abdomen and pelvis. No free intraperitoneal gas. No abdominal wall hernia. Musculoskeletal: The patient is cachectic. Progressive superior endplate compression deformity at the T9 level, with less than 25% loss of height. No acute bony abnormality. Reconstructed images  demonstrate no additional findings. IMPRESSION: 1. Suspected extraluminal gas and bowel contents within the right lower pelvis, adjacent to the rectosigmoid colon. Evaluation is limited without oral contrast, but distal perforated diverticulitis is suspected. 2. Hepatosplenomegaly, with persistent findings of portal venous hypertension. 3. Diffuse ascites. 4. Nonspecific diffuse bowel wall thickening. While this could be due to adjacent ascites, secondary bowel wall thickening due to diffuse peritonitis should be considered given the above findings. 5. Bilateral nonobstructing renal calculi. 6. Small bilateral pleural effusions and bibasilar atelectasis, right greater than left. Nonspecific peripheral ground-glass consolidation within the non dependent right middle lobe, which could be hypoventilatory, inflammatory, or infectious. 7.  Aortic Atherosclerosis (ICD10-I70.0). Critical Value/emergent results were called by telephone at the time of interpretation on 05/18/2022 at 8:16 pm to provider [facility] , who verbally acknowledged these results. Electronically Signed   By: Sharlet Salina M.D.   On: 06/03/2022 20:16   DG Chest Port 1 View  Result Date: 06/05/2022 CLINICAL DATA:  Questionable sepsis, evaluate for abnormality. History of fractured right humerus for several weeks. EXAM: PORTABLE CHEST 1 VIEW COMPARISON:  CT examination dated March 30, 2022 FINDINGS: The heart is enlarged. Right basilar opacity suggesting atelectasis or infiltrate. Left lung is clear. Comminuted displaced fracture of the right humeral head and neck partially imaged. Thoracic spondylosis. IMPRESSION: 1. Right basilar opacity suggesting atelectasis or infiltrate. 2. Comminuted displaced fracture of the right humeral head and neck partially imaged. Electronically Signed   By: Larose Hires D.O.   On: 06/01/2022 17:03     A/P: Chelsea Garcia is an 89 y.o. female with suspected perforated hollow viscus-most likely sigmoid colon based  upon imaging  findings with extraluminal stool in pelvis.  -The anatomy and physiology of the GI tract was discussed with the patient and her son and daughter-in-law. The pathophysiology of perforated viscus was discussed as well. -We have discussed options going forward.  We discussed that without surgery, it is likely that in the event that she actually has a perforated viscus this will ultimately take her life.  We did however discuss that with surgery, there are significant increased risk for complications giving her frailty, underlying health conditions including and most concerning her evident cirrhosis type changes evident on her CT scan including portal venous hypertension findings.  We discussed surgery is possible but again with increased risk for multiple complications and even death (also see risk calculator findings below as reviewed with her) -We have discussed surgery, exploratory laparotomy with likely sigmoid colectomy and end colostomy.  If you are found to have another type of perforation, we would tailor the surgery to that need. -We spent time discussing expectations with regards to what a colostomy evolve and the high likelihood that this may be a permanent thing in her case. -The planned procedures, material risks (including, but not limited to, pain, bleeding, infection, scarring, need for blood transfusion, damage to surrounding structures- blood vessels/nerves/viscus/organs, damage to ureter, urine leak, leak from anastomosis or rectal stump, need for additional procedures, worsening of pre-existing medical conditions, dialysis dependence, hernia, prolonged wound healing, recurrence of diverticulitis, pneumonia, heart attack, stroke, death) benefits and alternatives to surgery were discussed at length. The patient's (and her son and daughter-in-law's) questions were answered to their satisfaction, they voiced understanding and wish to proceed with surgery. Additionally, we discussed  typical postoperative expectations and the recovery process.       I spent a total of 75 minutes in both face-to-face and non-face-to-face activities, excluding procedures performed, for this visit on the date of this encounter.  Marin Olp, MD [facility], [facility]

## 2022-05-15 ENCOUNTER — Inpatient Hospital Stay (HOSPITAL_COMMUNITY): Payer: Medicare Other

## 2022-05-15 DIAGNOSIS — R062 Wheezing: Secondary | ICD-10-CM | POA: Diagnosis not present

## 2022-05-15 DIAGNOSIS — K9189 Other postprocedural complications and disorders of digestive system: Secondary | ICD-10-CM | POA: Diagnosis not present

## 2022-05-15 DIAGNOSIS — R64 Cachexia: Secondary | ICD-10-CM | POA: Diagnosis present

## 2022-05-15 DIAGNOSIS — K5720 Diverticulitis of large intestine with perforation and abscess without bleeding: Secondary | ICD-10-CM | POA: Diagnosis present

## 2022-05-15 DIAGNOSIS — D72825 Bandemia: Secondary | ICD-10-CM | POA: Diagnosis not present

## 2022-05-15 DIAGNOSIS — E43 Unspecified severe protein-calorie malnutrition: Secondary | ICD-10-CM | POA: Diagnosis present

## 2022-05-15 DIAGNOSIS — J9601 Acute respiratory failure with hypoxia: Secondary | ICD-10-CM | POA: Diagnosis not present

## 2022-05-15 DIAGNOSIS — J90 Pleural effusion, not elsewhere classified: Secondary | ICD-10-CM | POA: Diagnosis not present

## 2022-05-15 DIAGNOSIS — A419 Sepsis, unspecified organism: Secondary | ICD-10-CM | POA: Diagnosis present

## 2022-05-15 DIAGNOSIS — J479 Bronchiectasis, uncomplicated: Secondary | ICD-10-CM | POA: Diagnosis present

## 2022-05-15 DIAGNOSIS — E871 Hypo-osmolality and hyponatremia: Secondary | ICD-10-CM | POA: Diagnosis not present

## 2022-05-15 DIAGNOSIS — J690 Pneumonitis due to inhalation of food and vomit: Secondary | ICD-10-CM | POA: Diagnosis not present

## 2022-05-15 DIAGNOSIS — R627 Adult failure to thrive: Secondary | ICD-10-CM | POA: Diagnosis not present

## 2022-05-15 DIAGNOSIS — D7581 Myelofibrosis: Secondary | ICD-10-CM | POA: Diagnosis present

## 2022-05-15 DIAGNOSIS — R188 Other ascites: Secondary | ICD-10-CM | POA: Diagnosis present

## 2022-05-15 DIAGNOSIS — K766 Portal hypertension: Secondary | ICD-10-CM | POA: Diagnosis present

## 2022-05-15 DIAGNOSIS — S42291A Other displaced fracture of upper end of right humerus, initial encounter for closed fracture: Secondary | ICD-10-CM | POA: Diagnosis not present

## 2022-05-15 DIAGNOSIS — S42294D Other nondisplaced fracture of upper end of right humerus, subsequent encounter for fracture with routine healing: Secondary | ICD-10-CM | POA: Diagnosis not present

## 2022-05-15 DIAGNOSIS — K567 Ileus, unspecified: Secondary | ICD-10-CM | POA: Diagnosis not present

## 2022-05-15 DIAGNOSIS — R1084 Generalized abdominal pain: Secondary | ICD-10-CM | POA: Diagnosis not present

## 2022-05-15 DIAGNOSIS — Z7189 Other specified counseling: Secondary | ICD-10-CM | POA: Diagnosis not present

## 2022-05-15 DIAGNOSIS — N179 Acute kidney failure, unspecified: Secondary | ICD-10-CM | POA: Diagnosis present

## 2022-05-15 DIAGNOSIS — Z9049 Acquired absence of other specified parts of digestive tract: Secondary | ICD-10-CM

## 2022-05-15 DIAGNOSIS — I129 Hypertensive chronic kidney disease with stage 1 through stage 4 chronic kidney disease, or unspecified chronic kidney disease: Secondary | ICD-10-CM | POA: Diagnosis present

## 2022-05-15 DIAGNOSIS — K659 Peritonitis, unspecified: Secondary | ICD-10-CM | POA: Diagnosis present

## 2022-05-15 DIAGNOSIS — Z515 Encounter for palliative care: Secondary | ICD-10-CM | POA: Diagnosis not present

## 2022-05-15 DIAGNOSIS — K668 Other specified disorders of peritoneum: Secondary | ICD-10-CM | POA: Diagnosis not present

## 2022-05-15 DIAGNOSIS — R0600 Dyspnea, unspecified: Secondary | ICD-10-CM | POA: Diagnosis not present

## 2022-05-15 DIAGNOSIS — L89153 Pressure ulcer of sacral region, stage 3: Secondary | ICD-10-CM | POA: Diagnosis not present

## 2022-05-15 DIAGNOSIS — Z4659 Encounter for fitting and adjustment of other gastrointestinal appliance and device: Secondary | ICD-10-CM | POA: Diagnosis not present

## 2022-05-15 DIAGNOSIS — R198 Other specified symptoms and signs involving the digestive system and abdomen: Secondary | ICD-10-CM | POA: Diagnosis present

## 2022-05-15 DIAGNOSIS — R571 Hypovolemic shock: Secondary | ICD-10-CM | POA: Diagnosis present

## 2022-05-15 DIAGNOSIS — N136 Pyonephrosis: Secondary | ICD-10-CM | POA: Diagnosis present

## 2022-05-15 DIAGNOSIS — D649 Anemia, unspecified: Secondary | ICD-10-CM | POA: Diagnosis not present

## 2022-05-15 DIAGNOSIS — D473 Essential (hemorrhagic) thrombocythemia: Secondary | ICD-10-CM | POA: Diagnosis present

## 2022-05-15 DIAGNOSIS — N189 Chronic kidney disease, unspecified: Secondary | ICD-10-CM | POA: Diagnosis present

## 2022-05-15 DIAGNOSIS — N200 Calculus of kidney: Secondary | ICD-10-CM | POA: Diagnosis not present

## 2022-05-15 DIAGNOSIS — E872 Acidosis, unspecified: Secondary | ICD-10-CM | POA: Diagnosis present

## 2022-05-15 DIAGNOSIS — I959 Hypotension, unspecified: Secondary | ICD-10-CM | POA: Diagnosis not present

## 2022-05-15 DIAGNOSIS — Z9889 Other specified postprocedural states: Secondary | ICD-10-CM

## 2022-05-15 DIAGNOSIS — D638 Anemia in other chronic diseases classified elsewhere: Secondary | ICD-10-CM | POA: Diagnosis present

## 2022-05-15 DIAGNOSIS — Z66 Do not resuscitate: Secondary | ICD-10-CM | POA: Diagnosis not present

## 2022-05-15 DIAGNOSIS — R140 Abdominal distension (gaseous): Secondary | ICD-10-CM | POA: Diagnosis not present

## 2022-05-15 LAB — BASIC METABOLIC PANEL
Anion gap: 12 (ref 5–15)
BUN: 39 mg/dL — ABNORMAL HIGH (ref 8–23)
CO2: 18 mmol/L — ABNORMAL LOW (ref 22–32)
Calcium: 8.4 mg/dL — ABNORMAL LOW (ref 8.9–10.3)
Chloride: 108 mmol/L (ref 98–111)
Creatinine, Ser: 1.62 mg/dL — ABNORMAL HIGH (ref 0.44–1.00)
GFR, Estimated: 31 mL/min — ABNORMAL LOW (ref >60)
Glucose, Bld: 130 mg/dL — ABNORMAL HIGH (ref 70–99)
Potassium: 4.8 mmol/L (ref 3.5–5.1)
Sodium: 138 mmol/L (ref 135–145)

## 2022-05-15 LAB — CBC
HCT: 36.5 % (ref 36.0–46.0)
Hemoglobin: 10.8 g/dL — ABNORMAL LOW (ref 12.0–15.0)
MCH: 29 pg (ref 26.0–34.0)
MCHC: 29.6 g/dL — ABNORMAL LOW (ref 30.0–36.0)
MCV: 98.1 fL (ref 80.0–100.0)
Platelets: 298 10*3/uL (ref 150–400)
RBC: 3.72 MIL/uL — ABNORMAL LOW (ref 3.87–5.11)
RDW: 16.3 % — ABNORMAL HIGH (ref 11.5–15.5)
WBC: 60.4 10*3/uL (ref 4.0–10.5)
nRBC: 0 % (ref 0.0–0.2)

## 2022-05-15 LAB — CULTURE, BLOOD (ROUTINE X 2): Culture: NO GROWTH

## 2022-05-15 MED ORDER — ONDANSETRON HCL 4 MG PO TABS: 4.0000 mg | ORAL_TABLET | Freq: Four times a day (QID) | ORAL | Status: DC | PRN

## 2022-05-15 MED ORDER — OXYCODONE HCL 5 MG PO TABS
5.0000 mg | ORAL_TABLET | Freq: Four times a day (QID) | ORAL | Status: DC | PRN
  Administered 2022-05-16 – 2022-05-18 (×5): 5 mg via ORAL
  Administered 2022-05-18 – 2022-05-19 (×4): 10 mg via ORAL
  Administered 2022-05-20 – 2022-05-25 (×12): 5 mg via ORAL
  Filled 2022-05-15 (×2): qty 1
  Filled 2022-05-15: qty 2
  Filled 2022-05-15 (×6): qty 1
  Filled 2022-05-15 (×2): qty 2
  Filled 2022-05-15 (×2): qty 1
  Filled 2022-05-15: qty 2
  Filled 2022-05-15: qty 1
  Filled 2022-05-15: qty 2
  Filled 2022-05-15: qty 1
  Filled 2022-05-15: qty 2
  Filled 2022-05-15: qty 1
  Filled 2022-05-15: qty 2
  Filled 2022-05-15 (×4): qty 1

## 2022-05-15 MED ORDER — ALUM & MAG HYDROXIDE-SIMETH 200-200-20 MG/5ML PO SUSP: 30.0000 mL | Freq: Four times a day (QID) | ORAL | Status: DC | PRN

## 2022-05-15 MED ORDER — SIMETHICONE 80 MG PO CHEW
40.0000 mg | CHEWABLE_TABLET | Freq: Four times a day (QID) | ORAL | Status: DC | PRN
  Administered 2022-05-18 – 2022-05-20 (×2): 40 mg via ORAL
  Filled 2022-05-15 (×3): qty 1

## 2022-05-15 MED ORDER — CALCIUM CARBONATE 1250 (500 CA) MG PO TABS
1250.0000 mg | ORAL_TABLET | Freq: Every day | ORAL | Status: DC
  Administered 2022-05-15 – 2022-05-25 (×9): 1250 mg via ORAL
  Filled 2022-05-15 (×10): qty 1

## 2022-05-15 MED ORDER — FERROUS SULFATE 325 (65 FE) MG PO TABS
325.0000 mg | ORAL_TABLET | Freq: Every day | ORAL | Status: DC
  Administered 2022-05-15 – 2022-05-25 (×9): 325 mg via ORAL
  Filled 2022-05-15 (×9): qty 1

## 2022-05-15 MED ORDER — FENTANYL CITRATE (PF) 100 MCG/2ML IJ SOLN
INTRAMUSCULAR | Status: AC
  Filled 2022-05-15: qty 2

## 2022-05-15 MED ORDER — ACETAMINOPHEN 500 MG PO TABS
500.0000 mg | ORAL_TABLET | Freq: Four times a day (QID) | ORAL | Status: DC
  Administered 2022-05-15 – 2022-05-17 (×9): 500 mg via ORAL
  Filled 2022-05-15 (×9): qty 1

## 2022-05-15 MED ORDER — ENSURE SURGERY PO LIQD
237.0000 mL | Freq: Two times a day (BID) | ORAL | Status: DC
  Administered 2022-05-17 – 2022-05-22 (×5): 237 mL via ORAL
  Filled 2022-05-15 (×20): qty 237

## 2022-05-15 MED ORDER — LEVOTHYROXINE SODIUM 50 MCG PO TABS
50.0000 ug | ORAL_TABLET | Freq: Every morning | ORAL | Status: DC
  Administered 2022-05-15 – 2022-05-25 (×11): 50 ug via ORAL
  Filled 2022-05-15 (×11): qty 1

## 2022-05-15 MED ORDER — LACTATED RINGERS IV SOLN: INTRAVENOUS | Status: DC

## 2022-05-15 MED ORDER — POLYVINYL ALCOHOL 1.4 % OP SOLN: Freq: Every day | OPHTHALMIC | Status: DC | PRN

## 2022-05-15 MED ORDER — PIPERACILLIN-TAZOBACTAM IN DEX 2-0.25 GM/50ML IV SOLN
2.2500 g | Freq: Three times a day (TID) | INTRAVENOUS | Status: AC
  Administered 2022-05-15 – 2022-05-21 (×21): 2.25 g via INTRAVENOUS
  Filled 2022-05-15 (×21): qty 50

## 2022-05-15 MED ORDER — TAMSULOSIN HCL 0.4 MG PO CAPS
0.4000 mg | ORAL_CAPSULE | Freq: Every day | ORAL | Status: DC
  Administered 2022-05-15 – 2022-05-24 (×10): 0.4 mg via ORAL
  Filled 2022-05-15 (×10): qty 1

## 2022-05-15 MED ORDER — SUGAMMADEX SODIUM 200 MG/2ML IV SOLN
INTRAVENOUS | Status: DC | PRN
  Administered 2022-05-15: 200 mg via INTRAVENOUS

## 2022-05-15 MED ORDER — HEPARIN SODIUM (PORCINE) 5000 UNIT/ML IJ SOLN
5000.0000 [IU] | Freq: Three times a day (TID) | INTRAMUSCULAR | Status: DC
  Administered 2022-05-15 – 2022-05-26 (×34): 5000 [IU] via SUBCUTANEOUS
  Filled 2022-05-15 (×35): qty 1

## 2022-05-15 MED ORDER — HYDRALAZINE HCL 20 MG/ML IJ SOLN: 10.0000 mg | INTRAMUSCULAR | Status: DC | PRN

## 2022-05-15 MED ORDER — HYDROMORPHONE HCL 1 MG/ML IJ SOLN: 0.5000 mg | INTRAMUSCULAR | Status: DC | PRN

## 2022-05-15 MED ORDER — FENTANYL CITRATE (PF) 100 MCG/2ML IJ SOLN
25.0000 ug | INTRAMUSCULAR | Status: DC | PRN
  Administered 2022-05-15 (×2): 25 ug via INTRAVENOUS
  Administered 2022-05-15: 50 ug via INTRAVENOUS

## 2022-05-15 MED ORDER — ASPIRIN 81 MG PO TBEC
81.0000 mg | DELAYED_RELEASE_TABLET | Freq: Every day | ORAL | Status: DC
  Administered 2022-05-15 – 2022-05-25 (×11): 81 mg via ORAL
  Filled 2022-05-15 (×11): qty 1

## 2022-05-15 MED ORDER — NETARSUDIL DIMESYLATE 0.02 % OP SOLN
1.0000 [drp] | Freq: Every evening | OPHTHALMIC | Status: DC
  Administered 2022-05-16 – 2022-05-25 (×10): 1 [drp] via OPHTHALMIC
  Filled 2022-05-15 (×2): qty 2.5

## 2022-05-15 MED ORDER — DIPHENHYDRAMINE HCL 50 MG/ML IJ SOLN: 12.5000 mg | Freq: Four times a day (QID) | INTRAMUSCULAR | Status: DC | PRN

## 2022-05-15 MED ORDER — DIPHENHYDRAMINE HCL 12.5 MG/5ML PO ELIX: 12.5000 mg | ORAL_SOLUTION | Freq: Four times a day (QID) | ORAL | Status: DC | PRN

## 2022-05-15 MED ORDER — LATANOPROST 0.005 % OP SOLN
1.0000 [drp] | Freq: Every evening | OPHTHALMIC | Status: DC
  Administered 2022-05-15 – 2022-05-25 (×11): 1 [drp] via OPHTHALMIC
  Filled 2022-05-15: qty 2.5

## 2022-05-15 MED ORDER — ONDANSETRON HCL 4 MG/2ML IJ SOLN
4.0000 mg | Freq: Four times a day (QID) | INTRAMUSCULAR | Status: DC | PRN
  Administered 2022-05-19: 4 mg via INTRAVENOUS
  Filled 2022-05-15: qty 2

## 2022-05-15 MED ORDER — MOMETASONE FURO-FORMOTEROL FUM 100-5 MCG/ACT IN AERO
2.0000 | INHALATION_SPRAY | Freq: Two times a day (BID) | RESPIRATORY_TRACT | Status: DC
  Administered 2022-05-15 – 2022-05-26 (×19): 2 via RESPIRATORY_TRACT
  Filled 2022-05-15: qty 8.8

## 2022-05-15 MED ORDER — CHLORHEXIDINE GLUCONATE CLOTH 2 % EX PADS
6.0000 | MEDICATED_PAD | Freq: Every day | CUTANEOUS | Status: DC
  Administered 2022-05-15 – 2022-05-16 (×2): 6 via TOPICAL

## 2022-05-15 MED ORDER — BRIMONIDINE TARTRATE 0.2 % OP SOLN
1.0000 [drp] | Freq: Two times a day (BID) | OPHTHALMIC | Status: DC
  Administered 2022-05-15 – 2022-05-26 (×23): 1 [drp] via OPHTHALMIC
  Filled 2022-05-15 (×3): qty 5

## 2022-05-15 NOTE — Evaluation (Signed)
Occupational Therapy Evaluation Patient Details Name: Chelsea Garcia MRN: [id number] DOB: 01/02/1934 Today's Date: 05/15/2022   History of Present Illness Pt is an 89 y.o. female admitted 05/13/2022 with abdominal pain; workup for suspected perforated hollow viscus-most likely sigmoid colon. S/p ex lap with low anterior resection, end colostomy on 5/4. Of note, recent admission for R proximal humerus fx conervative management (03/2022); pt states she did not go to SNF. Other PMH includes cellulitis, osteopenia, essential tremor, LBP, glaucoma.   Clinical Impression   This 89 yo female admitted and underwent above presents to acute OT with PLOF of needing A with all basic ADLs and mobility to some degree since falling and breaking upper humerus 03/2022. Currently she is at a setup/S-total A level for basic ADLs and has multiple pressure areas on her back side and right elbow. She will continue to benefit from acute OT with follow up from continued inpatient follow up therapy, <3 hours/day.       Recommendations for follow up therapy are one component of a multi-disciplinary discharge planning process, led by the attending physician.  Recommendations may be updated based on patient status, additional functional criteria and insurance authorization.   Assistance Recommended at Discharge Frequent or constant Supervision/Assistance  Patient can return home with the following A lot of help with walking and/or transfers;A lot of help with bathing/dressing/bathroom;Assistance with cooking/housework;Help with stairs or ramp for entrance;Assist for transportation    Functional Status Assessment  Patient has had a recent decline in their functional status and demonstrates the ability to make significant improvements in function in a reasonable and predictable amount of time.  Equipment Recommendations  Other (comment) (TBD at next venue)       Precautions / Restrictions Precautions Precautions: Fall;Other  (comment) Precaution Comments: abdominal for comfort s/p colostomy; sacral wound; h/o R proximal humerus fx with conservative management (03/2022) Restrictions Weight Bearing Restrictions: Yes RUE Weight Bearing: Non weight bearing Other Position/Activity Restrictions: Per dtr-in-law on phone pt is still NWB'ing and should only be moving arm from elbow distally, does not need to wear sling             ADL either performed or assessed with clinical judgement   ADL Overall ADL's : Needs assistance/impaired Eating/Feeding: NPO Eating/Feeding Details (indicate cue type and reason): except ice chips Grooming: Moderate assistance;Bed level   Upper Body Bathing: Maximal assistance;Bed level   Lower Body Bathing: Total assistance;Bed level   Upper Body Dressing : Total assistance;Bed level   Lower Body Dressing: Total assistance;Bed level                       Vision Baseline Vision/History: 1 Wears glasses Ability to See in Adequate Light: 0 Adequate Patient Visual Report: No change from baseline              Pertinent Vitals/Pain Pain Assessment Pain Assessment: Faces Faces Pain Scale: Hurts little more Pain Location: right elbow (pressure wond) Pain Descriptors / Indicators: Discomfort, Sore Pain Intervention(s): Limited activity within patient's tolerance, Monitored during session, Repositioned (larger mepilex applied and pillow placed under elbow as well (made RN aware))     Hand Dominance Right (uses her left hand now for self feeding)   Extremity/Trunk Assessment Upper Extremity Assessment Upper Extremity Assessment: RUE deficits/detail RUE Deficits / Details: recent (03/2022) proximal humerus fx still not allowed to move shoulder and NWBing; can move elbow to hand but with tremor (reports this is not new) RUE Coordination: decreased gross  motor;decreased fine motor      Communication Communication Communication:  (low volume with talking)   Cognition  Arousal/Alertness: Awake/alert Behavior During Therapy: Flat affect Overall Cognitive Status: Within Functional Limits for tasks assessed                                          Exercises Other Exercises Other Exercises: instructed pt in hand, wrist, and forearm exercises for RUE to do on her own while in bed--she returned demonstrated and verbalized understanding. Also place a blanket roll underneath the right side of her pillow due to she tends to keep her neck rotated to the right at rest--explained to her why I was doing this and she verbalized understanding.        Home Living Family/patient expects to be discharged to:: Skilled nursing facility Living Arrangements: Children Available Help at Discharge: Family;Personal care attendant;Available 24 hours/day (8 hours at least 5 days a week; dtr-in-law also taking care of pt's husband at home) Type of Home: House Home Access: Level entry     Home Layout: One level     Bathroom Shower/Tub: Producer, television/film/video: Standard Bathroom Accessibility: Yes   Home Equipment: Grab bars - toilet;Grab bars - tub/shower;Shower seat;Rollator (4 wheels);Cane - quad   Additional Comments: live in in-law suite of son and daughter-in-law's home; son works 9-5, daughter-in-law home all day taking care of pt and pt's husband; 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      Prior Functioning/Environment Prior Level of Function : Needs assist             Mobility Comments: pt reports ambulating short distances "with my walker cane" ADLs Comments: family assisting with ADL/iADLs as needed since RUE injury; prior to that, pt independent        OT Problem List: Decreased strength;Decreased range of motion;Impaired balance (sitting and/or standing);Pain;Impaired UE functional use      OT Treatment/Interventions: Self-care/ADL training;DME and/or AE instruction;Patient/family education;Balance training;Therapeutic  exercise    OT Goals(Current goals can be found in the care plan section) Acute Rehab OT Goals Patient Stated Goal: to feel better OT Goal Formulation: With patient Time For Goal Achievement: 05/29/22 Potential to Achieve Goals: Fair  OT Frequency: Min 2X/week       AM-PAC OT "6 Clicks" Daily Activity     Outcome Measure Help from another person eating meals?: A Little Help from another person taking care of personal grooming?: A Lot Help from another person toileting, which includes using toliet, bedpan, or urinal?: Total Help from another person bathing (including washing, rinsing, drying)?: A Lot Help from another person to put on and taking off regular upper body clothing?: Total Help from another person to put on and taking off regular lower body clothing?: Total 6 Click Score: 10   End of Session Nurse Communication:  (RUE mepilex/positioning--may need egg crate foam v. pillow if pt continues to c/o of right elbow hurting her. New tilt bed pt is more to the left side of her bed with rotation than right, need to check on her shortly to make sure she is not against rail.)  Activity Tolerance: Patient limited by fatigue Patient left: in bed;with call bell/phone within reach  OT Visit Diagnosis: Other abnormalities of gait and mobility (R26.89);Muscle weakness (generalized) (M62.81);Pain Pain - Right/Left: Right Pain - part of body:  (  elbow)                Time: 9629-5284 OT Time Calculation (min): 32 min Charges:  OT General Charges $OT Visit: 1 Visit OT Evaluation $OT Eval Moderate Complexity: 1 Mod OT Treatments $Therapeutic Exercise: 8-22 mins  Lindon Romp OT Acute Rehabilitation Services Office [phone]    Evette Georges 05/15/2022, 3:55 PM

## 2022-05-15 NOTE — Final Progress Note (Signed)
Patient arrived to the floor arousals to voice and touch, pt had a foley and IV fluid running on right arm. Patient has two sacral wounds from prior admission confirmed by son and daughter in law.  SCDs on, stoma is beefy red, NGT is in left nare and connected to intermittent suction with greenish/red fluid drainage. Cardiac monitoring is initiated, CCMD notified, we'll continue to monitor.

## 2022-05-15 NOTE — Evaluation (Signed)
Physical Therapy Evaluation Patient Details Name: Chelsea Garcia MRN: [id number] DOB: [birth year] Today's Date: 05/15/2022  History of Present Illness  Pt is an 89 y.o. female admitted 05/24/2022 with abdominal pain; workup for suspected perforated hollow viscus-most likely sigmoid colon. S/p ex lap with low anterior resection, end colostomy on 5/4. Of note, recent admission for R proximal humerus fx conervative management (03/2022); pt states she did not go to SNF. Other PMH includes cellulitis, osteopenia, essential tremor, LBP, glaucoma.   Clinical Impression  Pt presents with an overall decrease in functional mobility secondary to above. PTA, pt limited household ambulator with walking cane since recent RUE fx (03/2022), lives with husband in in-law suite of son's home. Initiated educ re: activity recommendations, abdominal precautions, importance of mobility. Today, pt able to perform brief bout of standing activity with RW, requiring up to modA for mobility, limited by significant fatigue this session. Pt would benefit from post-acute rehab services (< 3 hrs/day) to maximize functional mobility and independence prior to return home; pt in agreement.      Recommendations for follow up therapy are one component of a multi-disciplinary discharge planning process, led by the attending physician.  Recommendations may be updated based on patient status, additional functional criteria and insurance authorization.  Follow Up Recommendations Can patient physically be transported by private vehicle: Yes     Assistance Recommended at Discharge Frequent or constant Supervision/Assistance  Patient can return home with the following  A lot of help with walking and/or transfers;A lot of help with bathing/dressing/bathroom;Assistance with cooking/housework;Assist for transportation;Help with stairs or ramp for entrance    Equipment Recommendations Other (comment) (defer to next venue)  Recommendations for Other  Services  OT consult    Functional Status Assessment Patient has had a recent decline in their functional status and demonstrates the ability to make significant improvements in function in a reasonable and predictable amount of time.     Precautions / Restrictions Precautions Precautions: Fall;Other (comment) Precaution Comments: abdominal for comfort s/p colostomy; sacral wound; h/o R proximal humerus fx with conservative management (03/2022) Restrictions Other Position/Activity Restrictions: no formal WB precautions, attempted to keep RUE NWB during session; when asked what pt doing at home, states, "I've been trying to keep weight off it"      Mobility  Bed Mobility Overal bed mobility: Needs Assistance Bed Mobility: Supine to Sit     Supine to sit: Mod assist     General bed mobility comments: modA for trunk elevation and scooting hips EOB    Transfers Overall transfer level: Needs assistance Equipment used: Rolling walker (2 wheels) Transfers: Sit to/from Stand, Bed to chair/wheelchair/BSC Sit to Stand: Min assist   Step pivot transfers: Min assist, Min guard       General transfer comment: minA for trunk elevation standing from EOB to RW; pivotal steps from bed>recliner with RW and min guard for balance, minA for RW management; further mobility limited by c/o fatigue    Ambulation/Gait                  Stairs            Wheelchair Mobility    Modified Rankin (Stroke Patients Only)       Balance Overall balance assessment: Needs assistance Sitting-balance support: No upper extremity supported, Feet supported Sitting balance-Leahy Scale: Fair Sitting balance - Comments: R lateral lean while sitting which pt attributes to scoliosis   Standing balance support: Single extremity supported, Bilateral upper extremity supported, Rel[place]  on assistive device for balance Standing balance-Leahy Scale: Poor                                Pertinent Vitals/Pain Pain Assessment Pain Assessment: Faces Faces Pain Scale: Hurts little more Pain Location: abdomen (surgical site) Pain Descriptors / Indicators: Discomfort, Grimacing, Guarding Pain Intervention(s): Monitored during session    Home Living Family/patient expects to be discharged to:: Private residence Living Arrangements: Children Available Help at Discharge: Family;Available PRN/intermittently Type of Home: House Home Access: Level entry       Home Layout: One level Home Equipment: Grab bars - toilet;Grab bars - tub/shower;Shower seat;Rollator (4 wheels);Cane - quad Additional Comments: live in in-law suite of son and daughter-in-law's home; son works 9-5, daughter-in-law home all day.    Prior Function Prior Level of Function : Independent/Modified Independent             Mobility Comments: pt reports ambulating short distances "with my walker cane" ADLs Comments: family assisting with ADL/iADLs as needed since RUE injury; prior to that, pt independent     Hand Dominance        Extremity/Trunk Assessment   Upper Extremity Assessment Upper Extremity Assessment: Generalized weakness    Lower Extremity Assessment Lower Extremity Assessment: Generalized weakness    Cervical / Trunk Assessment Cervical / Trunk Assessment: Other exceptions Cervical / Trunk Exceptions: s/p abdominal sx  Communication   Communication: Expressive difficulties (low volume)  Cognition Arousal/Alertness: Awake/alert Behavior During Therapy: Flat affect Overall Cognitive Status: No family/caregiver present to determine baseline cognitive functioning                                 General Comments: WFL for simple tasks, not formally assessed. requires cues to speak up. demonstrates awareness of need for post-acute rehab. suspect flat affect exacerbated by c/o significant fatigue        General Comments General comments (skin integrity, edema,  etc.): SpO2 88% on RA with activity, replaced 2L O2 High [place]. initiated educ re: role of acute PT, POC, activity recommendations, importance of OOB mobility, pressure relief to buttocks, abdominal precautions for comfort. pt very fatigued this session, limited teachback of education. pt aware of need for post-acute rehab ("this is too much for my kids to manage at home"), agreeable to pursue SNF if an option    Exercises     Assessment/Plan    PT Assessment Patient needs continued PT services  PT Problem List Decreased strength;Decreased activity tolerance;Decreased balance;Decreased mobility;Decreased knowledge of use of DME;Decreased safety awareness;Decreased knowledge of precautions;Cardiopulmonary status limiting activity;Pain;Decreased skin integrity       PT Treatment Interventions DME instruction;Gait training;Functional mobility training;Therapeutic activities;Therapeutic exercise;Balance training;Patient/family education    PT Goals (Current goals can be found in the Care Plan section)  Acute Rehab PT Goals Patient Stated Goal: agreeable to post-acute rehab to regain strength PT Goal Formulation: With patient Time For Goal Achievement: 05/29/22 Potential to Achieve Goals: Good    Frequency Min 1X/week     Co-evaluation               AM-PAC PT "6 Clicks" Mobility  Outcome Measure Help needed turning from your back to your side while in a flat bed without using bedrails?: A Lot Help needed moving from lying on your back to sitting on the side of a flat bed without using bedrails?:  A Lot Help needed moving to and from a bed to a chair (including a wheelchair)?: A Little Help needed standing up from a chair using your arms (e.g., wheelchair or bedside chair)?: A Little Help needed to walk in hospital room?: Total Help needed climbing 3-5 steps with a railing? : Total 6 Click Score: 12    End of Session Equipment Utilized During Treatment: Gait belt Activity Tolerance:  Patient tolerated treatment well;Patient limited by fatigue Patient left: in chair;with call bell/phone within reach;with chair alarm set Nurse Communication: Mobility status PT Visit Diagnosis: Other abnormalities of gait and mobility (R26.89);Muscle weakness (generalized) (M62.81)    Time: 1610-9604 PT Time Calculation (min) (ACUTE ONLY): 22 min   Charges:   PT Evaluation $PT Eval Moderate Complexity: 1 Mod        Ina Homes, PT, DPT Acute Rehabilitation Services  Personal: Secure Chat Rehab Office: [phone]  Malachy Chamber 05/15/2022, 1:43 PM

## 2022-05-15 NOTE — Transfer of Care (Signed)
Immediate Anesthesia Transfer of Care Note  Patient: Chelsea Garcia  Procedure(s) Performed: EXPLORATORY LAPAROTOMY, low anterior resection with end colostomy (Abdomen) LOW ANTERIOR BOWEL RESECTION  Patient Location: PACU  Anesthesia Type:General  Level of Consciousness: awake, alert , and oriented  Airway & Oxygen Therapy: Patient Spontanous Breathing and Patient connected to nasal cannula oxygen  Post-op Assessment: Report given to RN, Post -op Vital signs reviewed and stable, and Patient moving all extremities  Post vital signs: Reviewed and stable  Last Vitals:  Vitals Value Taken Time  BP 120/57 05/15/22 0045  Temp    Pulse 93 05/15/22 0055  Resp 22 05/15/22 0055  SpO2 91 % 05/15/22 0055  Vitals shown include unvalidated device data.  Last Pain:  Vitals:   05/20/2022 2200  TempSrc:   PainSc: 6       Patients Stated Pain Goal: 4 (06/09/2022 2200)  Complications: No notable events documented.

## 2022-05-15 NOTE — Op Note (Signed)
05/29/2022 - 05/15/2022  12:32 AM  PATIENT:  Chelsea Garcia  89 y.o. female  Patient Care Team: Darrow Bussing, MD as PCP - General (Family Medicine) Cay Schillings, MD (Inactive) as Consulting Physician (Hematology) Malachy Mood, MD as Attending Physician (Hematology and Oncology)  PRE-OPERATIVE DIAGNOSIS:  Perforated sigmoid diverticulitis  POST-OPERATIVE DIAGNOSIS:  Perforated sigmoid diverticulitis with phlegmon  PROCEDURE: Exploratory laparotomy with low anterior resection End colostomy  SURGEON:  Stephanie Coup. Naftali Carchi, MD  ASSISTANT: OR staff  ANESTHESIA:   general  COUNTS:  Sponge, needle and instrument counts were reported correct x2 at the conclusion of the operation.  EBL: 50 mL  DRAINS: None  SPECIMEN: Rectosigmoid colon - stitch marks proximal staple line  COMPLICATIONS: none  FINDINGS: Expected spino and hepatomegaly.  Her spleen extends into her left mid abdomen.  Sigmoid diverticulitis with a hairpin loop in the pelvis fixed to her proximal rectum.  There is Micole Delehanty fibrinous exudate coating her sigmoid colon.  1.5 L of thin ascites also encountered.  Somewhat turbid in the pelvis.  Injected loops of small bowel throughout the abdomen consistent with an inflammatory type response/peritonitis.  Low anterior resection is necessary to get below the diseased segment of rectosigmoid colon.  Hartman's type procedure carried out with end descending colostomy.  DISPOSITION: PACU in satisfactory condition  DESCRIPTION: The patient was identified in preop holding and taken to the OR where she was placed on the operating room table. SCDs were placed. General endotracheal anesthesia was induced without difficulty.  A Foley catheter was placed by nursing.  Pressure points were evaluated and padded.  She was then prepped and draped in the usual sterile fashion. A surgical timeout was performed indicating the correct patient, procedure, positioning and need for preoperative antibiotics.    A low midline incision was created.  Subcutaneous tissue divided electrocautery.  The fascia was then incised sharply and the peritoneum is cautiously entered with Metzenbaum scissors.  There are no significant adhesions to the low midline abdomen.  She does have an omental containing adhesion near her umbilicus which were able to take down with the LigaSure device.  1.5 L of thin ascites is encountered with somewhat more turbid appearing ascites in the pelvis.  An Alexis wound protector was placed.  A Balfour retractor was placed.  The small bowel was all evaluated and all injected consistent with a peritonitis type response.  There is no evident perforation of the small bowel.  The colon is evaluated.  The cecum and transverse colon are normal in appearance.  The descending colon is normal in appearance.  There is a elongated loop of sigmoid which extends into her pelvis which has a dense inflammatory type response with a thickened fibrinous exudative rind.  There is more turbid type ascites again in the pelvis.  We are able to evacuate all this.  The small bowel was then packed into the more upper abdomen.  The sigmoid colon is mobilized by incising the peritoneum overlying the intersigmoid fossa.  Were able to then dissect the sigmoid free from the left pelvic sidewall.  It is quite adherent to the pelvic sidewall structures likely related to her prior surgical history.  We maintain a plane ride along the sigmoid colon are able to reflect all this medially away from her gonadal vessels and left ureter.  There is a hairpin turn in her pelvis along the left side and the distalmost aspect of the sigmoid is adherent to the proximal rectum where this fibrinous rind is  encountered.  Upon entering the abdomen (organ space), I encountered purulence in the pelvis; phlegmon involving the sigmoid colon .  CASE DATA:  Type of patient?: DOW CASE (Surgical Hospitalist [facility])  Status of Case? EMERGENT Add  On  Infection Present At Time Of Surgery (PATOS)?  PHLEGMON involving the sigmoid colon  We're able to mobilize this loop of sigmoid up and the proximal suspect the rectum is fairly thickened and inflamed in appearance.  Therefore, a new low anterior section be necessary in order to remove the diseased segment of colon.  A window was created in the proximal mesorectum.  A green load on the contour stapler was then used to divide the proximal rectum.  The staple line is inspected noted to be both hemostatic and intact with well-formed staples.  A 2-0 Prolene stitch is used to tag of the left corner of the rectal staple line.  The proximal mesorectum was then divided by hugging the rectosigmoid colon closely using the LigaSure device.  Our proximal point of planned transection is identified on the junction of the descending and sigmoid colon.  A window was created mesentery at this level and the colon was divided using a green load of the contour stapler.  The intervening mesentery was then ligated and divided using the LigaSure maintaining a plane ride along the sigmoid colon in an effort to stay well away from her pelvic structures including her gonadal vessels and left ureter.  The specimen is then oriented with a stitch marking the proximal staple line and passed off.  The pelvis is then irrigated.  Hemostasis is verified.  The staple lines were inspected and noted to be intact.  Attention is then directed to maturing the colostomy.  We mobilized the descending colon to the extent necessary to allow this to easily reach the abdominal wall without any difficulty.  She does have a quite large spleen and therefore we opted to site this colostomy in her left lower quadrant.  Overlying the rectus muscle, we will the skin is excised.  The subcutaneous tissue divided electrocautery.  The fascia is incised in a longitudinal manner.  The rectus muscle was spread.  The posterior sheath is incised with my hand along  the peritoneum to protect the abdominal contents.  Were then able to dilate this up to approximately 1 and half fingerbreadths which given the relatively diminutive size of her colon is more than enough.  We able to then pass this through.  The abdomen is irrigated again and hemostasis is verified.  The bowel for and Alexis wound protector are removed.  The midline fascia is then closed using 2 running #1 PDS sutures.  All sponge, needle, and instrument counts are reported correct.  We did opt to go ahead and closed the skin using staples at this juncture given the hepatosplenomegaly that she exhibits as well as ascites in an effort to prevent any sort of ascitic leak.  The wound was then dressed with 2 x 2's and covered with tape.  Attention is directed at maturing the colostomy.  A towel was placed over our midline dressing.  Orientation is confirmed such there is no twisting of the colostomy.  The colostomy stable and is excised.  This is then matured in a partial Brooked manner using 3-0 Vicryl suture.  The colostomy is widely patent.  It is pink in color.  All final sponge, needle, and instrument counts are reported correct.  A colostomy appliance is then cut to fit.  She is then awakened from anesthesia, extubated, and transferred to a stretcher for transport to recovery in satisfactory condition.

## 2022-05-15 NOTE — Progress Notes (Signed)
Pacu RN Report to floor given  Gave report to  [employer]. Room: 4e25   Discussed surgery, meds given in OR and Pacu, VS, IV fluids given, EBL, urine output, pain and other pertinent information. Also discussed if pt had any family or friends here or belongings with them.    VSS, pain 2/10, midline incision with gauze/tape, CDI, no bleeding or hematoma, L of incision is colostomy w/ beefy red stoma, no output yet, NGT L nare w/ bileous/bloody drainage in tubing only. No bowel sounds. LSC. On 4L [place].   L arm Ivs infiltrated in OR, removed, swollen, elevated /w ice, +csm to hand/fingers.   Gave 100 mcg Fentanyl for pain w/ good effect. Ted hose and SCD on bilaterally.   Pt exits my care.

## 2022-05-15 NOTE — Plan of Care (Signed)
POC initiated and progressing. 

## 2022-05-15 NOTE — Progress Notes (Addendum)
Pt admitted over the night from PACU. Moring assessment at 1015 am found pt had stage 3 pressure ulcer injury and stage 1 pressure ulcer on R heel. Foam dressing applied on bilateral heel. Elevated heels. PA aware. Air matress ordered. Wound care RN consulted.   Lawson Radar, RN

## 2022-05-15 NOTE — Progress Notes (Addendum)
Date and time results received: 05/15/22 at 1225   Test: WBC Critical Value: 60.4  Name of Provider Notified: simaan, PA

## 2022-05-15 NOTE — Progress Notes (Signed)
[facility] Progress Note  1 Day Post-Op  Subjective: CC:  Reports buttock pain from uncomfortable bed. Per son, at bedside, she has a pressure injury on her sacrum, present for 1 months, just started medihoney. Denies history of kidney disease.  Objective: Vital signs in last 24 hours: Temp:  [97.4 F (36.3 C)-98.8 F (37.1 C)] 97.6 F (36.4 C) (05/05 0830) Pulse Rate:  [45-101] 80 (05/05 0830) Resp:  [18-28] 20 (05/05 0830) BP: (98-123)/(48-64) 117/64 (05/05 0830) SpO2:  [89 %-100 %] 98 % (05/05 0830) Weight:  [49.7 kg-49.9 kg] 49.7 kg (05/05 0447)    Intake/Output from previous day: 05/04 0701 - 05/05 0700 In: 2602.7 [I.V.:2052.7; IV Piggyback:550] Out: 210 [Urine:110; Blood:100] Intake/Output this shift: Total I/O In: -  Out: 250 [Urine:250]  PE: Gen:  Alert, NAD,  cachectic elderly female,  Card:  Regular rate and rhythm, pedal pulses 2+ BL Pulm:  Normal effort, clear to auscultation bilaterally Abd: Soft, appropriately tender, protuberant with moderate distention, incision c/d/I, ostomy pink and viable - no gas/stool in bag  NGT in place, low output. Skin: warm and dry, there is a 2x1x0.5 pressure injury to the sacrum without signs of infection, minimal fibrinous exudate wound bed.  Psych: A&Ox3   Lab Results:  Recent Labs    05/14/22 1717  WBC 48.6*  HGB 11.1*  HCT 35.9*  PLT 254   BMET Recent Labs    05/14/22 1717  NA 137  K 4.4  CL 104  CO2 21*  GLUCOSE 116*  BUN 36*  CREATININE 1.53*  CALCIUM 9.1   PT/INR Recent Labs    05/14/22 1717  LABPROT 17.2*  INR 1.4*   CMP     Component Value Date/Time   NA 137 05/14/2022 1717   NA 142 09/16/2016 0905   K 4.4 05/14/2022 1717   K 4.0 09/16/2016 0905   CL 104 05/14/2022 1717   CO2 21 (L) 05/14/2022 1717   CO2 23 09/16/2016 0905   GLUCOSE 116 (H) 05/14/2022 1717   GLUCOSE 107 09/16/2016 0905   BUN 36 (H) 05/14/2022 1717   BUN 18.3 09/16/2016 0905   CREATININE 1.53 (H)  05/14/2022 1717   CREATININE 1.40 (H) 02/21/2022 0916   CREATININE 0.8 09/16/2016 0905   CALCIUM 9.1 05/14/2022 1717   CALCIUM 9.8 09/16/2016 0905   PROT 6.5 05/14/2022 1717   PROT 7.1 09/16/2016 0905   ALBUMIN 3.3 (L) 05/14/2022 1717   ALBUMIN 4.2 09/16/2016 0905   AST 14 (L) 05/14/2022 1717   AST 12 (L) 02/21/2022 0916   AST 15 09/16/2016 0905   ALT 9 05/14/2022 1717   ALT 10 02/21/2022 0916   ALT 9 09/16/2016 0905   ALKPHOS 74 05/14/2022 1717   ALKPHOS 49 09/16/2016 0905   BILITOT 0.6 05/14/2022 1717   BILITOT 0.5 02/21/2022 0916   BILITOT 0.79 09/16/2016 0905   GFRNONAA 33 (L) 05/14/2022 1717   GFRNONAA 37 (L) 02/21/2022 0916   GFRAA >60 07/17/2019 1005   Lipase     Component Value Date/Time   LIPASE 39 04/08/2021 1600       Studies/Results: DG Abd 1 View  Result Date: 05/15/2022 CLINICAL DATA:  [id number]. Encounter to confirm nasogastric tube placement. PACU patient. EXAM: ABDOMEN - 1 VIEW COMPARISON:  CT abdomen and pelvis yesterday at 7:33 p.m. FINDINGS: Interval laparotomy with overlying midline skin staples. There is free air in the pelvis and left mid abdomen consistent with the open procedure. No dilated bowel is seen. Bilateral  nephrolithiasis seen on CT was better demonstrated on CT. Visceral shadows are stable. NGT has been inserted and terminates in the body of stomach. IMPRESSION: 1. NGT terminates in the body of the stomach. 2. Free air in the pelvis and left mid abdomen consistent with the recent open procedure. Electronically Signed   By: Almira Bar M.D.   On: 05/15/2022 01:53   CT ABDOMEN PELVIS W CONTRAST  Result Date: 05/14/2022 CLINICAL DATA:  Abdominal pain EXAM: CT ABDOMEN AND PELVIS WITH CONTRAST TECHNIQUE: Multidetector CT imaging of the abdomen and pelvis was performed using the standard protocol following bolus administration of intravenous contrast. RADIATION DOSE REDUCTION: This exam was performed according to the departmental dose-optimization  program which includes automated exposure control, adjustment of the mA and/or kV according to patient size and/or use of iterative reconstruction technique. CONTRAST:  50mL OMNIPAQUE IOHEXOL 350 MG/ML SOLN COMPARISON:  08/13/2021 FINDINGS: Lower chest: There are small bilateral pleural effusions, with dependent lower lobe consolidation favoring atelectasis. Peripheral ground-glass consolidation in the right middle lobe could be hypoventilatory, inflammatory, or infectious. The heart is enlarged without pericardial effusion. Hepatobiliary: Hepatomegaly without focal liver abnormality. Gallbladder is unremarkable. Pancreas: Unremarkable. No pancreatic ductal dilatation or surrounding inflammatory changes. Spleen: Massive splenomegaly again noted, spleen measuring greater than 22 cm in craniocaudal extent. No focal parenchymal abnormality. Adrenals/Urinary Tract: Bilateral renal calculi are again identified, measuring up to 18 mm on the right and 16 mm on the left. Bilateral extrarenal pelves are again identified. No evidence of hydronephrosis. The bladder is only minimally distended, limiting its evaluation. The adrenals are stable. Stomach/Bowel: No evidence of bowel obstruction or ileus. Diverticulosis of the colon, most pronounced distally. There is possible extraluminal gas and bowel contents within the right lower pelvis, reference images 76 through 82. There is diffuse wall thickening of the large and small bowel, nonspecific given the adjacent ascites. Vascular/Lymphatic: Portal venous hypertension manifested by enlargement of the portal vein, recanalization of the umbilical vein, upper abdominal varices, and massive splenomegaly. Atherosclerosis of the aorta and its branches. No pathologic adenopathy. Reproductive: Status post hysterectomy. No adnexal masses. Other: There is small volume ascites throughout the abdomen and pelvis. No free intraperitoneal gas. No abdominal wall hernia. Musculoskeletal: The  patient is cachectic. Progressive superior endplate compression deformity at the T9 level, with less than 25% loss of height. No acute bony abnormality. Reconstructed images demonstrate no additional findings. IMPRESSION: 1. Suspected extraluminal gas and bowel contents within the right lower pelvis, adjacent to the rectosigmoid colon. Evaluation is limited without oral contrast, but distal perforated diverticulitis is suspected. 2. Hepatosplenomegaly, with persistent findings of portal venous hypertension. 3. Diffuse ascites. 4. Nonspecific diffuse bowel wall thickening. While this could be due to adjacent ascites, secondary bowel wall thickening due to diffuse peritonitis should be considered given the above findings. 5. Bilateral nonobstructing renal calculi. 6. Small bilateral pleural effusions and bibasilar atelectasis, right greater than left. Nonspecific peripheral ground-glass consolidation within the non dependent right middle lobe, which could be hypoventilatory, inflammatory, or infectious. 7.  Aortic Atherosclerosis (ICD10-I70.0). Critical Value/emergent results were called by telephone at the time of interpretation on 05/14/2022 at 8:16 pm to provider [facility] , who verbally acknowledged these results. Electronically Signed   By: Sharlet Salina M.D.   On: 05/14/2022 20:16   DG Chest Port 1 View  Result Date: 05/14/2022 CLINICAL DATA:  Questionable sepsis, evaluate for abnormality. History of fractured right humerus for several weeks. EXAM: PORTABLE CHEST 1 VIEW COMPARISON:  CT examination  dated March 30, 2022 FINDINGS: The heart is enlarged. Right basilar opacity suggesting atelectasis or infiltrate. Left lung is clear. Comminuted displaced fracture of the right humeral head and neck partially imaged. Thoracic spondylosis. IMPRESSION: 1. Right basilar opacity suggesting atelectasis or infiltrate. 2. Comminuted displaced fracture of the right humeral head and neck partially imaged. Electronically  Signed   By: Larose Hires D.O.   On: 05/14/2022 17:03    Anti-infectives: Anti-infectives (From admission, onward)    Start     Dose/Rate Route Frequency Ordered Stop   05/15/22 1900  ceFEPIme (MAXIPIME) 2 g in sodium chloride 0.9 % 100 mL IVPB  Status:  Discontinued        2 g 200 mL/hr over 30 Minutes Intravenous Every 24 hours 05/14/22 1819 05/15/22 0109   05/15/22 0600  piperacillin-tazobactam (ZOSYN) IVPB 2.25 g        2.25 g 100 mL/hr over 30 Minutes Intravenous Every 8 hours 05/15/22 0302     05/14/22 1630  ceFEPIme (MAXIPIME) 2 g in sodium chloride 0.9 % 100 mL IVPB        2 g 200 mL/hr over 30 Minutes Intravenous  Once 05/14/22 1621 05/14/22 1856   05/14/22 1630  metroNIDAZOLE (FLAGYL) IVPB 500 mg        500 mg 100 mL/hr over 60 Minutes Intravenous  Once 05/14/22 1621 05/14/22 1856        Assessment/Plan Perforated sigmoid diverticulitis S/p exploratory laparotomy, LAR, end colostomy 5/5 Dr. Cliffton Asters  - POD#0, afebrile, VSS, AM labs pending - NGT, sips with meds ok for now, await bowel function - WOC RN consult for new ostomy, evaluation of pressure injuries/wound care orders - IS/pulm toilet, PT/OT  FEN: NPO, IVF 75 mL/hr VTE: SCD's, SQH q 8h ID: cefepime/flagyl 5/4. Zosyn 5/5 >> continue 5 days post-op/  Foley: continue, remove tomorrow POD#1 Dispo: SDU   hepatosplenomegaly  ?CKD - creatinine 1.38-1.70 last 2 mos. GERD Endometriosis Thrombocytosis (Dr. Mosetta Putt)  Sacral decubitus ulcer - stage 3?, WOC consult    LOS: 0 days   I reviewed nursing notes, ED provider notes, last 24 h vitals and pain scores, last 48 h intake and output, last 24 h labs and trends, and last 24 h imaging results.    Hosie Spangle, PA-C [facility] Please see Amion for pager number during day hours 7:00am-4:30pm

## 2022-05-16 ENCOUNTER — Encounter (HOSPITAL_COMMUNITY): Payer: Self-pay | Admitting: Surgery

## 2022-05-16 LAB — BASIC METABOLIC PANEL
Anion gap: 13 (ref 5–15)
BUN: 45 mg/dL — ABNORMAL HIGH (ref 8–23)
CO2: 18 mmol/L — ABNORMAL LOW (ref 22–32)
Calcium: 8.2 mg/dL — ABNORMAL LOW (ref 8.9–10.3)
Chloride: 108 mmol/L (ref 98–111)
Creatinine, Ser: 1.85 mg/dL — ABNORMAL HIGH (ref 0.44–1.00)
GFR, Estimated: 26 mL/min — ABNORMAL LOW (ref >60)
Glucose, Bld: 121 mg/dL — ABNORMAL HIGH (ref 70–99)
Potassium: 4.5 mmol/L (ref 3.5–5.1)
Sodium: 139 mmol/L (ref 135–145)

## 2022-05-16 LAB — CBC
HCT: 30.9 % — ABNORMAL LOW (ref 36.0–46.0)
Hemoglobin: 9.1 g/dL — ABNORMAL LOW (ref 12.0–15.0)
MCH: 28.5 pg (ref 26.0–34.0)
MCHC: 29.4 g/dL — ABNORMAL LOW (ref 30.0–36.0)
MCV: 96.9 fL (ref 80.0–100.0)
Platelets: 284 10*3/uL (ref 150–400)
RBC: 3.19 MIL/uL — ABNORMAL LOW (ref 3.87–5.11)
RDW: 16.3 % — ABNORMAL HIGH (ref 11.5–15.5)
WBC: 33.2 10*3/uL — ABNORMAL HIGH (ref 4.0–10.5)
nRBC: 0.1 % (ref 0.0–0.2)

## 2022-05-16 MED ORDER — MEDIHONEY WOUND/BURN DRESSING EX PSTE
1.0000 | PASTE | Freq: Every day | CUTANEOUS | Status: DC
  Administered 2022-05-16 – 2022-05-26 (×11): 1 via TOPICAL
  Filled 2022-05-16 (×2): qty 44

## 2022-05-16 NOTE — Progress Notes (Signed)
2 Days Post-Op  Subjective: CC: Feeling better today. Chelsea Garcia abdominal pain that is mostly around her incision. Pain is well controlled. NGT in place, 200cc/24 hours. No nausea. No ostomy output. Foley out and voiding. Worked with therapies yesterday. Weaned to RA this am. Agreeable to SNF at d/c.   Objective: Vital signs in last 24 hours: Temp:  [97.4 F (36.3 C)-98 F (36.7 C)] 98 F (36.7 C) (05/06 1109) Pulse Rate:  [73-85] 74 (05/06 1400) Resp:  [18-25] 18 (05/06 1400) BP: (99-117)/(47-60) 100/52 (05/06 1400) SpO2:  [91 %-94 %] 94 % (05/06 1400) Weight:  [49.7 kg] 49.7 kg (05/06 0500) Last BM Date :  (PTA)  Intake/Output from previous day: 05/05 0701 - 05/06 0700 In: 1812.5 [I.V.:1662.5; IV Piggyback:150] Out: 1100 [Urine:900; Emesis/NG output:200] Intake/Output this shift: Total I/O In: 528.8 [I.V.:528.8] Out: -   PE: Gen:  Alert, NAD, pleasant Card:  Reg Pulm:  CTAB, no W/R/R, effort normal Abd: Soft, mild distension, appropriately tender around incisions, no rigidity or guarding and otherwise NT. Hypoactive BS. Stoma viable. Some sweat but no air or stool in ostomy bag. Midline wound cdi with staples present.  Ext:  No LE edema. MAE's GU: Patient would like to hold off on me evaluating sacral wound  Psych: A&Ox3   Lab Results:  Recent Labs    05/15/22 1050 05/16/22 0126  WBC 60.4* 33.2*  HGB 10.8* 9.1*  HCT 36.5 30.9*  PLT 298 284   BMET Recent Labs    05/15/22 1050 05/16/22 0126  NA 138 139  K 4.8 4.5  CL 108 108  CO2 18* 18*  GLUCOSE 130* 121*  BUN 39* 45*  CREATININE 1.62* 1.85*  CALCIUM 8.4* 8.2*   PT/INR Recent Labs    06/02/2022 1717  LABPROT 17.2*  INR 1.4*   CMP     Component Value Date/Time   NA 139 05/16/2022 0126   NA 142 09/16/2016 0905   K 4.5 05/16/2022 0126   K 4.0 09/16/2016 0905   CL 108 05/16/2022 0126   CO2 18 (L) 05/16/2022 0126   CO2 23 09/16/2016 0905   GLUCOSE 121 (H) 05/16/2022 0126   GLUCOSE 107  09/16/2016 0905   BUN 45 (H) 05/16/2022 0126   BUN 18.3 09/16/2016 0905   CREATININE 1.85 (H) 05/16/2022 0126   CREATININE 1.40 (H) 02/21/2022 0916   CREATININE 0.8 09/16/2016 0905   CALCIUM 8.2 (L) 05/16/2022 0126   CALCIUM 9.8 09/16/2016 0905   PROT 6.5 05/28/2022 1717   PROT 7.1 09/16/2016 0905   ALBUMIN 3.3 (L) 05/31/2022 1717   ALBUMIN 4.2 09/16/2016 0905   AST 14 (L) 05/23/2022 1717   AST 12 (L) 02/21/2022 0916   AST 15 09/16/2016 0905   ALT 9 05/24/2022 1717   ALT 10 02/21/2022 0916   ALT 9 09/16/2016 0905   ALKPHOS 74 05/30/2022 1717   ALKPHOS 49 09/16/2016 0905   BILITOT 0.6 05/22/2022 1717   BILITOT 0.5 02/21/2022 0916   BILITOT 0.79 09/16/2016 0905   GFRNONAA 26 (L) 05/16/2022 0126   GFRNONAA 37 (L) 02/21/2022 0916   GFRAA >60 07/17/2019 1005   Lipase     Component Value Date/Time   LIPASE 39 04/08/2021 1600    Studies/Results: DG Abd 1 View  Result Date: 05/15/2022 CLINICAL DATA:  [id number]. Encounter to confirm nasogastric tube placement. PACU patient. EXAM: ABDOMEN - 1 VIEW COMPARISON:  CT abdomen and pelvis yesterday at 7:33 p.m. FINDINGS: Interval laparotomy with overlying midline skin  staples. There is free air in the pelvis and left mid abdomen consistent with the open procedure. No dilated bowel is seen. Bilateral nephrolithiasis seen on CT was better demonstrated on CT. Visceral shadows are stable. NGT has been inserted and terminates in the body of stomach. IMPRESSION: 1. NGT terminates in the body of the stomach. 2. Free air in the pelvis and left mid abdomen consistent with the recent open procedure. Electronically Signed   By: Almira Bar M.D.   On: 05/15/2022 01:53   CT ABDOMEN PELVIS W CONTRAST  Result Date: 05/18/2022 CLINICAL DATA:  Abdominal pain EXAM: CT ABDOMEN AND PELVIS WITH CONTRAST TECHNIQUE: Multidetector CT imaging of the abdomen and pelvis was performed using the standard protocol following bolus administration of intravenous contrast.  RADIATION DOSE REDUCTION: This exam was performed according to the departmental dose-optimization program which includes automated exposure control, adjustment of the mA and/or kV according to patient size and/or use of iterative reconstruction technique. CONTRAST:  50mL OMNIPAQUE IOHEXOL 350 MG/ML SOLN COMPARISON:  08/13/2021 FINDINGS: Lower chest: There are small bilateral pleural effusions, with dependent lower lobe consolidation favoring atelectasis. Peripheral ground-glass consolidation in the right middle lobe could be hypoventilatory, inflammatory, or infectious. The heart is enlarged without pericardial effusion. Hepatobiliary: Hepatomegaly without focal liver abnormality. Gallbladder is unremarkable. Pancreas: Unremarkable. No pancreatic ductal dilatation or surrounding inflammatory changes. Spleen: Massive splenomegaly again noted, spleen measuring greater than 22 cm in craniocaudal extent. No focal parenchymal abnormality. Adrenals/Urinary Tract: Bilateral renal calculi are again identified, measuring up to 18 mm on the right and 16 mm on the left. Bilateral extrarenal pelves are again identified. No evidence of hydronephrosis. The bladder is only minimally distended, limiting its evaluation. The adrenals are stable. Stomach/Bowel: No evidence of bowel obstruction or ileus. Diverticulosis of the colon, most pronounced distally. There is possible extraluminal gas and bowel contents within the right lower pelvis, reference images 76 through 82. There is diffuse wall thickening of the large and small bowel, nonspecific given the adjacent ascites. Vascular/Lymphatic: Portal venous hypertension manifested by enlargement of the portal vein, recanalization of the umbilical vein, upper abdominal varices, and massive splenomegaly. Atherosclerosis of the aorta and its branches. No pathologic adenopathy. Reproductive: Status post hysterectomy. No adnexal masses. Other: There is small volume ascites throughout the  abdomen and pelvis. No free intraperitoneal gas. No abdominal wall hernia. Musculoskeletal: The patient is cachectic. Progressive superior endplate compression deformity at the T9 level, with less than 25% loss of height. No acute bony abnormality. Reconstructed images demonstrate no additional findings. IMPRESSION: 1. Suspected extraluminal gas and bowel contents within the right lower pelvis, adjacent to the rectosigmoid colon. Evaluation is limited without oral contrast, but distal perforated diverticulitis is suspected. 2. Hepatosplenomegaly, with persistent findings of portal venous hypertension. 3. Diffuse ascites. 4. Nonspecific diffuse bowel wall thickening. While this could be due to adjacent ascites, secondary bowel wall thickening due to diffuse peritonitis should be considered given the above findings. 5. Bilateral nonobstructing renal calculi. 6. Small bilateral pleural effusions and bibasilar atelectasis, right greater than left. Nonspecific peripheral ground-glass consolidation within the non dependent right middle lobe, which could be hypoventilatory, inflammatory, or infectious. 7.  Aortic Atherosclerosis (ICD10-I70.0). Critical Value/emergent results were called by telephone at the time of interpretation on 05/30/2022 at 8:16 pm to provider [facility] , who verbally acknowledged these results. Electronically Signed   By: Sharlet Salina M.D.   On: 06/04/2022 20:16   DG Chest Port 1 View  Result Date: 05/15/2022 CLINICAL DATA:  Questionable sepsis, evaluate for abnormality. History of fractured right humerus for several weeks. EXAM: PORTABLE CHEST 1 VIEW COMPARISON:  CT examination dated March 30, 2022 FINDINGS: The heart is enlarged. Right basilar opacity suggesting atelectasis or infiltrate. Left lung is clear. Comminuted displaced fracture of the right humeral head and neck partially imaged. Thoracic spondylosis. IMPRESSION: 1. Right basilar opacity suggesting atelectasis or infiltrate. 2.  Comminuted displaced fracture of the right humeral head and neck partially imaged. Electronically Signed   By: Larose Hires D.O.   On: 06/02/2022 17:03    Anti-infectives: Anti-infectives (From admission, onward)    Start     Dose/Rate Route Frequency Ordered Stop   05/15/22 1900  ceFEPIme (MAXIPIME) 2 g in sodium chloride 0.9 % 100 mL IVPB  Status:  Discontinued        2 g 200 mL/hr over 30 Minutes Intravenous Every 24 hours 06/10/2022 1819 05/15/22 0109   05/15/22 0600  piperacillin-tazobactam (ZOSYN) IVPB 2.25 g        2.25 g 100 mL/hr over 30 Minutes Intravenous Every 8 hours 05/15/22 0302     05/28/2022 1630  ceFEPIme (MAXIPIME) 2 g in sodium chloride 0.9 % 100 mL IVPB        2 g 200 mL/hr over 30 Minutes Intravenous  Once 06/01/2022 1621 05/27/2022 1856   06/03/2022 1630  metroNIDAZOLE (FLAGYL) IVPB 500 mg        500 mg 100 mL/hr over 60 Minutes Intravenous  Once 05/18/2022 1621 06/02/2022 1856        Assessment/Plan POD 1 s/p exploratory laparotomy, LAR, end colostomy for perforated sigmoid diverticulitis with phlegm by Dr. Cliffton Asters on 5/5 - NGT to LIWS. AROBF.  - Cont abx, plan 5d post op - WOC RN consult for new ostomy - IS/pulm toilet, PT/OT - AM labs  FEN: NPO, NGT to LIWS, IVF 75 mL/hr while NPO VTE: SCD's, SQH q 8h ID: cefepime/flagyl 5/4. Zosyn 5/5 >> continue 5 days post-op. Afebrile. WBC 60.4 > 33.2 Foley: out, voiding. Monitor I/O.  Dispo: SDU    Acute on chronic anemia - may be ABL anemia and/or dilutional. Hgb 11.1 > 10.8 > 9.1  R humerus fx - 3/21. Per notes, Dr. Carola Frost planned non-op management with sling and NWB. Will reach out to his PA to ensure most up to date WB status. Plan OT while here.  Hepatosplenomegaly ?CKD - 1.85 today. Continue IVF. Creatinine 1.38-1.70 last 2 mos. PCP f/u.  GERD Endometriosis Thrombocytosis (follows with Dr. Mosetta Putt)  Sacral decubitus ulcer/pressure injuries - WOCN consult. Medihoney.    LOS: 1 day    Jacinto Halim , [facility] 05/16/2022, 3:23 PM Please see Amion for pager number during day hours 7:00am-4:30pm

## 2022-05-16 NOTE — Consult Note (Signed)
WOC Nurse ostomy consult note Stoma type/location: LMQ colostomy Stomal assessment/size: 1 1/2", edematous, pale and slightly moist Peristomal assessment: creasing at 9 o'clock midline staple line Treatment options for stomal/peristomal skin: barrier ring and 2piece 2 1/4" pouch.  Cut off center to avoid midline staple line.  Pattern left in folder.  Output blood tinged liquid only at this time.  NG tube in place Ostomy pouching: 2pc. With barrier ring  Education provided: Patient alert and oriented to situation.  She lives with son, daughter in law and spouse. She states they all have medical needs and a nurse comes in to help them as needed. When I further inquired about frequency, she stated anytime needed.  I explain twice weekly pouch changes and emptying when 1/3 full.  It is unclear if she could manage emptying at this time, but is able to roll independently in bed when prompted. Colostomy education folder left at bedside.   Enrolled patient in [employer] DC program: Yes will today WOC Nurse Consult Note: Reason for Consult: Unstageable pressure injury to coccyx Trauma wound to right elbow.  Bruising to left inner upper and lower arm and elbow with edema.  Wound type: pressure and trauma.  She states she had a slip while being transported.  Pressure Injury POA: Yes Measurement: coccyx:  0.3 cm nonintact lesion with surrounding maroon discoloration indicative of deep tissue injury Wound bed: 100% devitalized tissue, fibrin slough Drainage (amount, consistency, odor) minimal serosanguinous  no odor Periwound: deep maroon discoloration to sacrococcygeal area.  LEft elbow with bruising and edema (involves upper and lower arm) Right elbow with 0.2 cm nonitnact lesion with fibrin to wound bed.  Dressing procedure/placement/frequency: Cleanse sacral wound and right elbow wound with NS and pat dry. Apply medihoney to wound bed.  Cover with silicone foam. Change daily and PRN soilage.   Elevate left arm on pillow due to edema Will follow for ostomy care Mike Gip MSN, RN, FNP-BC CWON Wound, Ostomy, Continence Nurse Outpatient [facility] [phone] Pager [phone]

## 2022-05-16 NOTE — Anesthesia Postprocedure Evaluation (Addendum)
Anesthesia Post Note  Patient: Hassel Neth  Procedure(s) Performed: EXPLORATORY LAPAROTOMY, low anterior resection with end colostomy (Abdomen) LOW ANTERIOR BOWEL RESECTION     Patient location during evaluation: PACU Anesthesia Type: General Level of consciousness: awake and alert Pain management: pain level controlled Vital Signs Assessment: post-procedure vital signs reviewed and stable Respiratory status: spontaneous breathing, nonlabored ventilation, respiratory function stable and patient connected to nasal cannula oxygen Cardiovascular status: blood pressure returned to baseline and stable Postop Assessment: no apparent nausea or vomiting Anesthetic complications: no Comments: Infiltrated IV on L arm w/ edema - elevation and compress applied. Pulses present. No discomfort per patient.    No notable events documented.                 Shelton Silvas

## 2022-05-16 NOTE — Progress Notes (Signed)
Physical Therapy Treatment Patient Details Name: Chelsea Garcia MRN: [id number] DOB: November 05, 1933 Today's Date: 05/16/2022   History of Present Illness Pt is an 89 y.o. female admitted 05/16/2022 with abdominal pain; workup for suspected perforated hollow viscus-most likely sigmoid colon. S/p ex lap with low anterior resection, end colostomy on 5/4. Of note, recent admission for R proximal humerus fx conervative management (03/2022); pt states she did not go to SNF. Other PMH includes cellulitis, osteopenia, essential tremor, LBP, glaucoma.    PT Comments    Pt agreeable to therapy, and reports she has to urinate and would like to get to the [facility]. Pt is modA for getting to EoB and min A for standing and pivoting to BSC. Pt very uncomfortable on BSC and asks to get off. Encouraged pt to attempt short walk, however pt report still needing to urinate and request to return to bed to use bed pan. Pt requires maxA for return to bed. Once in supine able to bridge hips to place bed pan however still unsuccessful. Refuses further mobility due to fatigue. D/c plan remains appropriate.     Recommendations for follow up therapy are one component of a multi-disciplinary discharge planning process, led by the attending physician.  Recommendations may be updated based on patient status, additional functional criteria and insurance authorization.  Follow Up Recommendations  Can patient physically be transported by private vehicle: Yes    Assistance Recommended at Discharge Frequent or constant Supervision/Assistance  Patient can return home with the following A lot of help with walking and/or transfers;A lot of help with bathing/dressing/bathroom;Assistance with cooking/housework;Assist for transportation;Help with stairs or ramp for entrance   Equipment Recommendations  Other (comment) (defer to next venue)    Recommendations for Other Services OT consult     Precautions / Restrictions Precautions Precautions:  Fall;Other (comment) Precaution Comments: NG tube, abdominal for comfort s/p colostomy; sacral wound; h/o R proximal humerus fx with conservative management (03/2022) Restrictions Weight Bearing Restrictions: Yes RUE Weight Bearing: Non weight bearing Other Position/Activity Restrictions: Per dtr-in-law on phone pt is still NWB'ing and should only be moving arm from elbow distally, does not need to wear sling     Mobility  Bed Mobility Overal bed mobility: Needs Assistance Bed Mobility: Supine to Sit, Sit to Supine     Supine to sit: Mod assist Sit to supine: Max assist   General bed mobility comments: modA for trunk elevation and scooting hips EOB, max A for returning to bed. pt able to bridge for bed pan placement once bakc in bed    Transfers Overall transfer level: Needs assistance Equipment used: 1 person hand held assist Transfers: Sit to/from Stand, Bed to chair/wheelchair/BSC Sit to Stand: Min assist   Step pivot transfers: Min assist       General transfer comment: min A for power up and step pivot to and from bed to [facility]    Ambulation/Gait               General Gait Details: pt reports exhaustion and request to return to bed after attempt to use BSC          Balance Overall balance assessment: Needs assistance Sitting-balance support: No upper extremity supported, Feet supported Sitting balance-Leahy Scale: Fair Sitting balance - Comments: R lateral lean while sitting which pt attributes to scoliosis   Standing balance support: Single extremity supported, Bilateral upper extremity supported, Reliant on assistive device for balance Standing balance-Leahy Scale: Poor  Cognition Arousal/Alertness: Awake/alert Behavior During Therapy: Flat affect Overall Cognitive Status: Within Functional Limits for tasks assessed                                 General Comments: WFL for simple tasks, very flat  with decreased volume,        Exercises Other Exercises Other Exercises: instructed pt in hand, wrist, and forearm exercises for RUE to do on her own while in bed--she returned demonstrated and verbalized understanding. Also place a blanket roll underneath the right side of her pillow due to she tends to keep her neck rotated to the right at rest--explained to her why I was doing this and she verbalized understanding.    General Comments General comments (skin integrity, edema, etc.): VSS on RA,      Pertinent Vitals/Pain Pain Assessment Pain Assessment: Faces Faces Pain Scale: Hurts little more Pain Location: bottom Pain Descriptors / Indicators: Discomfort, Sore Pain Intervention(s): Monitored during session, Limited activity within patient's tolerance, Repositioned     PT Goals (current goals can now be found in the care plan section) Acute Rehab PT Goals Patient Stated Goal: agreeable to post-acute rehab to regain strength PT Goal Formulation: With patient Time For Goal Achievement: 05/29/22 Potential to Achieve Goals: Good Progress towards PT goals: Progressing toward goals    Frequency    Min 1X/week      PT Plan Current plan remains appropriate       AM-PAC PT "6 Clicks" Mobility   Outcome Measure  Help needed turning from your back to your side while in a flat bed without using bedrails?: A Lot Help needed moving from lying on your back to sitting on the side of a flat bed without using bedrails?: A Lot Help needed moving to and from a bed to a chair (including a wheelchair)?: A Little Help needed standing up from a chair using your arms (e.g., wheelchair or bedside chair)?: A Little Help needed to walk in hospital room?: Total Help needed climbing 3-5 steps with a railing? : Total 6 Click Score: 12    End of Session Equipment Utilized During Treatment: Gait belt Activity Tolerance: Patient tolerated treatment well;Patient limited by fatigue Patient  left: in chair;with call bell/phone within reach;with chair alarm set Nurse Communication: Mobility status PT Visit Diagnosis: Other abnormalities of gait and mobility (R26.89);Muscle weakness (generalized) (M62.81)     Time: 1356-1430 PT Time Calculation (min) (ACUTE ONLY): 34 min  Charges:  $Therapeutic Activity: 8-22 mins                     Keiron Iodice B. Beverely Risen PT, DPT Acute Rehabilitation Services Please use secure chat or  Call Office [phone]    Elon Alas [facility] 05/16/2022, 2:54 PM

## 2022-05-17 LAB — CULTURE, BLOOD (ROUTINE X 2)

## 2022-05-17 LAB — BASIC METABOLIC PANEL
Anion gap: 10 (ref 5–15)
BUN: 41 mg/dL — ABNORMAL HIGH (ref 8–23)
CO2: 17 mmol/L — ABNORMAL LOW (ref 22–32)
Calcium: 8.3 mg/dL — ABNORMAL LOW (ref 8.9–10.3)
Chloride: 110 mmol/L (ref 98–111)
Creatinine, Ser: 1.6 mg/dL — ABNORMAL HIGH (ref 0.44–1.00)
GFR, Estimated: 31 mL/min — ABNORMAL LOW (ref >60)
Glucose, Bld: 100 mg/dL — ABNORMAL HIGH (ref 70–99)
Potassium: 4 mmol/L (ref 3.5–5.1)
Sodium: 137 mmol/L (ref 135–145)

## 2022-05-17 LAB — CBC
HCT: 29.1 % — ABNORMAL LOW (ref 36.0–46.0)
Hemoglobin: 8.9 g/dL — ABNORMAL LOW (ref 12.0–15.0)
MCH: 29.3 pg (ref 26.0–34.0)
MCHC: 30.6 g/dL (ref 30.0–36.0)
MCV: 95.7 fL (ref 80.0–100.0)
Platelets: 260 10*3/uL (ref 150–400)
RBC: 3.04 MIL/uL — ABNORMAL LOW (ref 3.87–5.11)
RDW: 16.3 % — ABNORMAL HIGH (ref 11.5–15.5)
WBC: 25.1 10*3/uL — ABNORMAL HIGH (ref 4.0–10.5)
nRBC: 0.1 % (ref 0.0–0.2)

## 2022-05-17 LAB — URINE CULTURE

## 2022-05-17 MED ORDER — ACETAMINOPHEN 500 MG PO TABS
1000.0000 mg | ORAL_TABLET | Freq: Three times a day (TID) | ORAL | Status: DC
  Administered 2022-05-17 – 2022-05-25 (×24): 1000 mg via ORAL
  Filled 2022-05-17 (×24): qty 2

## 2022-05-17 MED ORDER — ALBUMIN HUMAN 5 % IV SOLN
12.5000 g | Freq: Once | INTRAVENOUS | Status: AC
  Administered 2022-05-17: 12.5 g via INTRAVENOUS
  Filled 2022-05-17: qty 250

## 2022-05-17 MED ORDER — HYDROMORPHONE HCL 1 MG/ML IJ SOLN
0.5000 mg | INTRAMUSCULAR | Status: DC | PRN
  Administered 2022-05-19 – 2022-05-26 (×10): 0.5 mg via INTRAVENOUS
  Filled 2022-05-17 (×11): qty 0.5

## 2022-05-17 MED ORDER — MELATONIN 3 MG PO TABS
3.0000 mg | ORAL_TABLET | Freq: Every evening | ORAL | Status: DC | PRN
  Administered 2022-05-17 – 2022-05-24 (×8): 3 mg via ORAL
  Filled 2022-05-17 (×8): qty 1

## 2022-05-17 MED ORDER — GUAIFENESIN ER 600 MG PO TB12
600.0000 mg | ORAL_TABLET | Freq: Two times a day (BID) | ORAL | Status: DC
  Administered 2022-05-17 – 2022-05-25 (×16): 600 mg via ORAL
  Filled 2022-05-17 (×16): qty 1

## 2022-05-17 NOTE — TOC Initial Note (Signed)
Transition of Care [facility]) - Initial/Assessment Note    Patient Details  Name: Chelsea Garcia MRN: [id number] Date of Birth: Jun 06, 1933  Transition of [facility]) CM/SW Contact:    Eduard Roux, LCSW Phone Number: 05/17/2022, 2:37 PM  Clinical Narrative:                  CSW met with patient and her daughter in law at bedside. CSW introduced self and explained role. Patient and family is agreeable to short term rehab at [facility]. CSW explained the SNF process. Family reports recently discharged from SNF. They understand patient maybe in co-pay status and is willing to pay. Family reports no preferred SNF at this time.   TOC will provide bed offers once available  Antony Blackbird, MSW, LCSW Clinical Social Worker     Expected Discharge Plan: Skilled Nursing Facility Barriers to Discharge: English as a second language teacher, Continued Medical Work up   Patient Goals and CMS Choice            Expected Discharge Plan and Services In-house Referral: Clinical Social Work     Living arrangements for the past 2 months: Single Family Home                                      Prior Living Arrangements/Services Living arrangements for the past 2 months: Single Family Home Lives with:: Self Patient language and need for interpreter reviewed:: No        Need for Family Participation in Patient Care: Yes (Comment) Care giver support system in place?: Yes (comment)   Criminal Activity/Legal Involvement Pertinent to Current Situation/Hospitalization: No - Comment as needed  Activities of Daily Living Home Assistive Devices/Equipment: Environmental consultant (specify type), Shower chair with back, Cane (specify quad or straight), Eyeglasses (straight cane) ADL Screening (condition at time of admission) Patient's cognitive ability adequate to safely complete daily activities?: Yes Is the patient deaf or have difficulty hearing?: No Does the patient have difficulty seeing, even when wearing  glasses/contacts?: No Does the patient have difficulty concentrating, remembering, or making decisions?: No Patient able to express need for assistance with ADLs?: Yes Does the patient have difficulty dressing or bathing?: No Independently performs ADLs?: No Communication: Independent Dressing (OT): Independent Grooming: Independent Feeding: Independent Bathing: Independent Toileting: Independent In/Out Bed: Independent Walks in Home: Independent with device (comment) (straight cane and or walker) Does the patient have difficulty walking or climbing stairs?: Yes Weakness of Legs: Both Weakness of Arms/Hands: None  Permission Sought/Granted Permission sought to share information with : Family Supports Permission granted to share information with : Yes, Verbal Permission Granted  Share Information with NAME: Georgena Unsell  Permission granted to share info w AGENCY: SNFs  Permission granted to share info w Relationship: daugter in law  Permission granted to share info w Contact Information: [phone]  Emotional Assessment Appearance:: Appears stated age Attitude/Demeanor/Rapport: Engaged Affect (typically observed): Appropriate Orientation: : Oriented to Self, Oriented to Place, Oriented to  Time, Oriented to Situation Alcohol / Substance Use: Not Applicable Psych Involvement: No (comment)  Admission diagnosis:  Perforated viscus [R19.8] Status post surgery [Z98.890] S/P partial colectomy [Z90.49] Patient Active Problem List   Diagnosis Date Noted   Status post surgery 05/15/2022   S/P partial colectomy 05/15/2022   Humeral surgical neck fracture 03/30/2022   Anemia 02/22/2022   Bronchiectasis without complication (HCC) 09/12/2019   Laryngopharyngeal reflux (LPR) 08/20/2019   Chronic cough  08/19/2019   Globus pharyngeus 07/16/2019   Bilateral hearing loss 11/01/2017   Deviated septum 09/21/2016   Seasonal allergic rhinitis 09/21/2016   Essential thrombocytosis (HCC)  10/01/2013   PCP:  Darrow Bussing, MD Pharmacy:   [facility] [facility] [address] - [place], [place] - [address] [facility] [address] [facility] [address] [facility] [address] [facility] [place] [address] Phone: [phone] Fax: [phone]  [id number] [place], [place] - [address] AT [facility] [address] [facility] [address] [facility] [address] [place] [address] Phone: [phone] Fax: [phone]  [facility] [place] [facility] [address] [place] [address] Phone: [phone] Fax: [phone]     Social Determinants of Health (SDOH) Social History: SDOH Screenings   Food Insecurity: No Food Insecurity (05/15/2022)  Housing: Low Risk  (05/30/2022)  Transportation Needs: No Transportation Needs (05/23/2022)  Utilities: Not At Risk (06/05/2022)  Depression (PHQ2-9): Medium Risk (04/13/2020)  Tobacco Use: Medium Risk (05/16/2022)   SDOH Interventions:     Readmission Risk Interventions     No data to display

## 2022-05-17 NOTE — Progress Notes (Addendum)
3 Days Post-Op  Subjective: CC: Feeling better today. No abdominal pain today. Sounds like some soreness mostly around her incision yesterday that was well controlled. NGT in place, 180cc/24 hours. No nausea. Having ostomy output. Voiding without dysuria or gross hematuria. Worked with therapies yesterday (getting to eob,standing and pivoting to bsc). She thinks her bp normally runs low but is not sure how low. Looks like on 01/26/22 it was 100/60 during an office visit w/ pulm.   Objective: Vital signs in last 24 hours: Temp:  [97.5 F (36.4 C)-98 F (36.7 C)] 97.7 F (36.5 C) (05/07 0329) Pulse Rate:  [60-78] 60 (05/07 0329) Resp:  [18-20] 18 (05/07 0329) BP: (94-110)/(45-53) 94/45 (05/07 0329) SpO2:  [92 %-95 %] 92 % (05/07 0329) Weight:  [49.6 kg] 49.6 kg (05/07 0600) Last BM Date :  (with colostomy)  Intake/Output from previous day: 05/06 0701 - 05/07 0700 In: 1666.1 [I.V.:1616.1; IV Piggyback:50] Out: 880 [Urine:450; Emesis/NG output:180; Stool:250] Intake/Output this shift: No intake/output data recorded.  PE: Gen:  Alert, NAD, pleasant Card:  Reg Pulm:  CTAB, no W/R/R, effort normal. On RA.  Abd: Soft, mild distension, appropriately tender around incisions, no rigidity or guarding and otherwise NT. + BS. Stoma viable. Air and dark semi-solid stool in ostomy bag. Midline wound cdi with staples present.  Ext:  No LE edema. MAE's Psych: A&Ox3   Lab Results:  Recent Labs    05/16/22 0126 05/17/22 0133  WBC 33.2* 25.1*  HGB 9.1* 8.9*  HCT 30.9* 29.1*  PLT 284 260    BMET Recent Labs    05/16/22 0126 05/17/22 0133  NA 139 137  K 4.5 4.0  CL 108 110  CO2 18* 17*  GLUCOSE 121* 100*  BUN 45* 41*  CREATININE 1.85* 1.60*  CALCIUM 8.2* 8.3*    PT/INR Recent Labs    06/07/2022 1717  LABPROT 17.2*  INR 1.4*    CMP     Component Value Date/Time   NA 137 05/17/2022 0133   NA 142 09/16/2016 0905   K 4.0 05/17/2022 0133   K 4.0 09/16/2016 0905   CL  110 05/17/2022 0133   CO2 17 (L) 05/17/2022 0133   CO2 23 09/16/2016 0905   GLUCOSE 100 (H) 05/17/2022 0133   GLUCOSE 107 09/16/2016 0905   BUN 41 (H) 05/17/2022 0133   BUN 18.3 09/16/2016 0905   CREATININE 1.60 (H) 05/17/2022 0133   CREATININE 1.40 (H) 02/21/2022 0916   CREATININE 0.8 09/16/2016 0905   CALCIUM 8.3 (L) 05/17/2022 0133   CALCIUM 9.8 09/16/2016 0905   PROT 6.5 05/16/2022 1717   PROT 7.1 09/16/2016 0905   ALBUMIN 3.3 (L) 06/01/2022 1717   ALBUMIN 4.2 09/16/2016 0905   AST 14 (L) 06/09/2022 1717   AST 12 (L) 02/21/2022 0916   AST 15 09/16/2016 0905   ALT 9 05/13/2022 1717   ALT 10 02/21/2022 0916   ALT 9 09/16/2016 0905   ALKPHOS 74 06/05/2022 1717   ALKPHOS 49 09/16/2016 0905   BILITOT 0.6 06/01/2022 1717   BILITOT 0.5 02/21/2022 0916   BILITOT 0.79 09/16/2016 0905   GFRNONAA 31 (L) 05/17/2022 0133   GFRNONAA 37 (L) 02/21/2022 0916   GFRAA >60 07/17/2019 1005   Lipase     Component Value Date/Time   LIPASE 39 04/08/2021 1600    Studies/Results: No results found.  Anti-infectives: Anti-infectives (From admission, onward)    Start     Dose/Rate Route Frequency Ordered Stop  05/15/22 1900  ceFEPIme (MAXIPIME) 2 g in sodium chloride 0.9 % 100 mL IVPB  Status:  Discontinued        2 g 200 mL/hr over 30 Minutes Intravenous Every 24 hours 05/31/2022 1819 05/15/22 0109   05/15/22 0600  piperacillin-tazobactam (ZOSYN) IVPB 2.25 g        2.25 g 100 mL/hr over 30 Minutes Intravenous Every 8 hours 05/15/22 0302     05/20/2022 1630  ceFEPIme (MAXIPIME) 2 g in sodium chloride 0.9 % 100 mL IVPB        2 g 200 mL/hr over 30 Minutes Intravenous  Once 06/02/2022 1621 05/31/2022 1856   06/03/2022 1630  metroNIDAZOLE (FLAGYL) IVPB 500 mg        500 mg 100 mL/hr over 60 Minutes Intravenous  Once 06/07/2022 1621 05/27/2022 1856        Assessment/Plan POD 2 s/p exploratory laparotomy, LAR, end colostomy for perforated sigmoid diverticulitis with phlegm by Dr. Cliffton Asters on 5/5 -  Path pending - D/c NGT. CLD - Cont abx, plan 5d post op - WOC RN consult for new ostomy. Plan ostomy clinic referral at d/c.  - IS/pulm toilet - PT/OT. Plan SNF - AM labs  FEN: D/c NGT. CLD. Cont IVF 75 mL/hr with soft BP's this am. Will give albumin now.  VTE: SCD's, SQH q 8h ID: cefepime/flagyl 5/4. Zosyn 5/5 >> continue 5 days post-op. Afebrile. WBC 60.4 > 33.2 > 25.1 Foley: out, voiding. Monitor I/O.  Dispo: SDU    UTI - Ucx from 5/4 w/ proteus mirabilis. Susceptibilities pending.  Acute on chronic anemia - may be ABL anemia and/or dilutional. Hgb stable at 8.9 R humerus fx - 3/21. Per notes, Dr. Carola Frost planned non-op management with sling and NWB. Will reach out to his PA to ensure most up to date WB status. Plan OT while here.  Hepatosplenomegaly ?CKD - Creatinine 1.38-1.70 last 2 mos. Improved to 1.6. Appears at baseline. PCP f/u.  GERD Endometriosis Thrombocytosis (follows with Dr. Mosetta Putt) - Plt 260 Sacral decubitus ulcer/pressure injuries - WOCN consult. Medihoney.    LOS: 2 days    Jacinto Halim , [facility] 05/17/2022, 7:59 AM Please see Amion for pager number during day hours 7:00am-4:30pm

## 2022-05-17 NOTE — Progress Notes (Signed)
Pt has been adjusted and repositioned multiple times throughout day as patient could not find comfortable position. Pt asked multiple times to be given something to make her sleep. Family at bedside stated she wanted oxycodone but was not in pain. Family requested that pt be given tylenol or a "placebo" because she was not hurting. Family requested doctor be called for order for melatonin. Pt removed purewick and understands importance of letting staff know when she needs to void.  Pt fell asleep when son visited and CT called to see when pt would be able to come down. CT will call again later. Pt asleep with call bell within reach.  Will continue to monitor.

## 2022-05-17 NOTE — Progress Notes (Signed)
Removed NG tube from left nare. Pt tolerated procedure well. Pt given mouth rinse and family at bedside. Family to order clear liquid diet for pt. Pt resting with call bell within reach.  Will continue to monitor.'

## 2022-05-17 NOTE — NC FL2 (Signed)
[place] MEDICAID FL2 LEVEL OF CARE FORM     IDENTIFICATION  Patient Name: Chelsea Garcia Birthdate: 14-Dec-1933 Sex: female Admission Date (Current Location): 05/22/2022  [facility] and [place] Number:  Producer, television/film/video and Address:  [facility], [address] [place], [place] [address]      Provider Number: [id number]  Attending Physician Name and Address:  Montez Morita, Md, MD  Relative Name and Phone Number:       Current Level of Care: Hospital Recommended Level of Care: Skilled Nursing Facility Prior Approval Number:    Date Approved/Denied:   PASRR Number: [id number]  Discharge Plan: SNF    Current Diagnoses: Patient Active Problem List   Diagnosis Date Noted   Status post surgery 05/15/2022   S/P partial colectomy 05/15/2022   Humeral surgical neck fracture 03/30/2022   Anemia 02/22/2022   Bronchiectasis without complication (HCC) 09/12/2019   Laryngopharyngeal reflux (LPR) 08/20/2019   Chronic cough 08/19/2019   Globus pharyngeus 07/16/2019   Bilateral hearing loss 11/01/2017   Deviated septum 09/21/2016   Seasonal allergic rhinitis 09/21/2016   Essential thrombocytosis (HCC) 10/01/2013    Orientation RESPIRATION BLADDER Height & Weight     Time, Situation, Place, Self  Normal Incontinent Weight: 109 lb 5.6 oz (49.6 kg) Height:  4\' 10"  (147.3 cm)  BEHAVIORAL SYMPTOMS/MOOD NEUROLOGICAL BOWEL NUTRITION STATUS      Incontinent Diet (please see discharge summary)  AMBULATORY STATUS COMMUNICATION OF NEEDS Skin   Limited Assist Verbally Surgical wounds (pressure injury abdomen, Pressure injury RT Hell Stage I, pressure injury Sacrum Mid Satge 3)                       Personal Care Assistance Level of Assistance  Bathing, Feeding, Dressing Bathing Assistance: Limited assistance Feeding assistance: Limited assistance Dressing Assistance: Limited assistance     Functional Limitations Info  Sight, Hearing, Speech Sight Info:  Adequate Hearing Info: Adequate Speech Info: Adequate    SPECIAL CARE FACTORS FREQUENCY  PT (By licensed PT), OT (By licensed OT)     PT Frequency: 5x per week OT Frequency: 5x per week            Contractures Contractures Info: Not present    Additional Factors Info  Code Status, Allergies Code Status Info: FULL Allergies Info: Nystatin, Bee Pollen, Albuteraol, Doxycycline,Mangifera Indica Fruit, Mite,Sulfamethoxazole           Current Medications (05/17/2022):  This is the current hospital active medication list Current Facility-Administered Medications  Medication Dose Route Frequency Provider Last Rate Last Admin   acetaminophen (TYLENOL) tablet 1,000 mg  1,000 mg Oral Q8H Maczis, Elmer Sow, PA-C   1,000 mg at 05/17/22 1402   alum & mag hydroxide-simeth (MAALOX/MYLANTA) 200-200-20 MG/5ML suspension 30 mL  30 mL Oral Q6H PRN Andria Meuse, MD       aspirin EC tablet 81 mg  81 mg Oral Daily Andria Meuse, MD   81 mg at 05/17/22 1403   brimonidine (ALPHAGAN) 0.2 % ophthalmic solution 1 drop  1 drop Both Eyes BID Andria Meuse, MD   1 drop at 05/17/22 5621   calcium carbonate (OS-CAL - dosed in mg of elemental calcium) tablet 1,250 mg  1,250 mg Oral Q breakfast Andria Meuse, MD   1,250 mg at 05/16/22 0946   diphenhydrAMINE (BENADRYL) 12.5 MG/5ML elixir 12.5 mg  12.5 mg Oral Q6H PRN Andria Meuse, MD  Or   diphenhydrAMINE (BENADRYL) injection 12.5 mg  12.5 mg Intravenous Q6H PRN Andria Meuse, MD       feeding supplement (ENSURE SURGERY) liquid 237 mL  237 mL Oral BID BM Andria Meuse, MD   237 mL at 05/17/22 1421   ferrous sulfate tablet 325 mg  325 mg Oral Q breakfast Andria Meuse, MD   325 mg at 05/16/22 0946   heparin injection 5,000 Units  5,000 Units Subcutaneous Q8H Andria Meuse, MD   5,000 Units at 05/17/22 1403   hydrALAZINE (APRESOLINE) injection 10 mg  10 mg Intravenous Q2H PRN Andria Meuse, MD       HYDROmorphone (DILAUDID) injection 0.5 mg  0.5 mg Intravenous Q3H PRN Jacinto Halim, PA-C       lactated ringers infusion   Intravenous Continuous Andria Meuse, MD 75 mL/hr at 05/17/22 0700 Infusion Verify at 05/17/22 0700   latanoprost (XALATAN) 0.005 % ophthalmic solution 1 drop  1 drop Both Eyes QPM Andria Meuse, MD   1 drop at 05/16/22 1819   leptospermum manuka honey (MEDIHONEY) paste 1 Application  1 Application Topical Daily Dimple Nanas, MD   1 Application at 05/17/22 1404   levothyroxine (SYNTHROID) tablet 50 mcg  50 mcg Oral q morning Andria Meuse, MD   50 mcg at 05/17/22 0547   melatonin tablet 3 mg  3 mg Oral QHS PRN Jacinto Halim, PA-C       mometasone-formoterol (DULERA) 100-5 MCG/ACT inhaler 2 puff  2 puff Inhalation BID Andria Meuse, MD   2 puff at 05/17/22 0811   Netarsudil Dimesylate 0.02 % SOLN 1 drop  1 drop Ophthalmic QPM Andria Meuse, MD   1 drop at 05/16/22 1819   ondansetron (ZOFRAN) tablet 4 mg  4 mg Oral Q6H PRN Andria Meuse, MD       Or   ondansetron [facility]) injection 4 mg  4 mg Intravenous Q6H PRN Andria Meuse, MD       oxyCODONE (Oxy IR/ROXICODONE) immediate release tablet 5-10 mg  5-10 mg Oral Q6H PRN Andria Meuse, MD   5 mg at 05/17/22 0055   piperacillin-tazobactam (ZOSYN) IVPB 2.25 g  2.25 g Intravenous Q8H Andria Meuse, MD 100 mL/hr at 05/17/22 1441 2.25 g at 05/17/22 1441   polyvinyl alcohol (LIQUIFILM TEARS) 1.4 % ophthalmic solution   Both Eyes Daily PRN Andria Meuse, MD       simethicone [facility]) chewable tablet 40 mg  40 mg Oral Q6H PRN Andria Meuse, MD       tamsulosin [facility]) capsule 0.4 mg  0.4 mg Oral QHS Andria Meuse, MD   0.4 mg at 05/16/22 2216     Discharge Medications: Please see discharge summary for a list of discharge medications.  Relevant Imaging Results:  Relevant Lab Results:   Additional Information SSNB  [id number]  Eduard Roux, LCSW

## 2022-05-18 ENCOUNTER — Inpatient Hospital Stay (HOSPITAL_COMMUNITY): Payer: Medicare Other

## 2022-05-18 ENCOUNTER — Other Ambulatory Visit: Payer: Medicare Other

## 2022-05-18 DIAGNOSIS — S42291A Other displaced fracture of upper end of right humerus, initial encounter for closed fracture: Secondary | ICD-10-CM | POA: Diagnosis not present

## 2022-05-18 LAB — BASIC METABOLIC PANEL
Anion gap: 7 (ref 5–15)
BUN: 38 mg/dL — ABNORMAL HIGH (ref 8–23)
CO2: 20 mmol/L — ABNORMAL LOW (ref 22–32)
Calcium: 8.1 mg/dL — ABNORMAL LOW (ref 8.9–10.3)
Chloride: 109 mmol/L (ref 98–111)
Creatinine, Ser: 1.38 mg/dL — ABNORMAL HIGH (ref 0.44–1.00)
GFR, Estimated: 37 mL/min — ABNORMAL LOW (ref >60)
Glucose, Bld: 119 mg/dL — ABNORMAL HIGH (ref 70–99)
Potassium: 3.6 mmol/L (ref 3.5–5.1)
Sodium: 136 mmol/L (ref 135–145)

## 2022-05-18 LAB — CBC
HCT: 29.8 % — ABNORMAL LOW (ref 36.0–46.0)
Hemoglobin: 9.2 g/dL — ABNORMAL LOW (ref 12.0–15.0)
MCH: 29 pg (ref 26.0–34.0)
MCHC: 30.9 g/dL (ref 30.0–36.0)
MCV: 94 fL (ref 80.0–100.0)
Platelets: 262 10*3/uL (ref 150–400)
RBC: 3.17 MIL/uL — ABNORMAL LOW (ref 3.87–5.11)
RDW: 16.1 % — ABNORMAL HIGH (ref 11.5–15.5)
WBC: 21.4 10*3/uL — ABNORMAL HIGH (ref 4.0–10.5)
nRBC: 0.1 % (ref 0.0–0.2)

## 2022-05-18 LAB — URINE CULTURE

## 2022-05-18 LAB — SURGICAL PATHOLOGY

## 2022-05-18 LAB — CULTURE, BLOOD (ROUTINE X 2): Culture: NO GROWTH

## 2022-05-18 MED ORDER — DIPHENHYDRAMINE HCL 50 MG/ML IJ SOLN
12.5000 mg | Freq: Four times a day (QID) | INTRAMUSCULAR | Status: DC | PRN
  Administered 2022-05-19: 12.5 mg via INTRAVENOUS
  Filled 2022-05-18: qty 1

## 2022-05-18 MED ORDER — DIPHENHYDRAMINE HCL 12.5 MG/5ML PO ELIX: 12.5000 mg | ORAL_SOLUTION | Freq: Four times a day (QID) | ORAL | Status: DC | PRN

## 2022-05-18 NOTE — Progress Notes (Signed)
Physical Therapy Treatment Patient Details Name: Chelsea Garcia MRN: [id number] DOB: 09-20-1933 Today's Date: 05/18/2022   History of Present Illness Pt is an 89 y.o. female admitted 05/16/2022 with abdominal pain; workup for suspected perforated hollow viscus-most likely sigmoid colon. S/p ex lap with low anterior resection, end colostomy on 5/4. Of note, recent admission for R proximal humerus fx conervative management (03/2022); pt states she did not go to SNF. MD ordered RUE CT 5/8 awaiting results at time of session so kept NWB. Other PMH includes cellulitis, osteopenia, essential tremor, LBP, glaucoma.    PT Comments    Pt calling out for assist when PTA walking by room, pt requesting assist to reposition in chair due to significant discomfort after pt had slid down in recliner. PTA calling RN to room as pt needing +2 totalA for posterior supine scooting in fully reclined chair. Pt modA for rolling to L/R sides in supine for placement of mechanical lift pad underneath her. After repositioning pillows and pt more upright in recliner, pt agreeable to try sitting up in recliner a bit longer to build her strength. RN/NT aware of need for hoyer for return transfer to bed due to pt fatigue/functional decline. Pt may benefit from palliative care consult given functional decline, RN/PA sent secure chat re: pt decline. Per chart review, also awaiting CT of RUE results, pt still NWB RUE at time of session. Pt continues to benefit from PT services to progress toward functional mobility goals.    Recommendations for follow up therapy are one component of a multi-disciplinary discharge planning process, led by the attending physician.  Recommendations may be updated based on patient status, additional functional criteria and insurance authorization.  Follow Up Recommendations  Can patient physically be transported by private vehicle: No (not at current level; unable to ambulate with +1 assist)    Assistance  Recommended at Discharge Frequent or constant Supervision/Assistance  Patient can return home with the following A lot of help with walking and/or transfers;A lot of help with bathing/dressing/bathroom;Assistance with cooking/housework;Assist for transportation;Help with stairs or ramp for entrance   Equipment Recommendations  Other (comment) (defer to next venue; currently would need air bed, mechanical lift, wheelchair with reclining back rest and elevating removable leg rests)    Recommendations for Other Services OT consult     Precautions / Restrictions Precautions Precautions: Fall;Other (comment) Precaution Comments: Abdominal precs for comfort s/p colostomy; sacral wound; h/o R proximal humerus fx with conservative management (03/2022); LUE edema Restrictions Weight Bearing Restrictions: Yes RUE Weight Bearing: Non weight bearing Other Position/Activity Restrictions: quad cane baseline, RUE NWB for transfers     Mobility  Bed Mobility Overal bed mobility: Needs Assistance Bed Mobility: Rolling Rolling: Mod assist         General bed mobility comments: pt reclined further in chair and rolling to L/R sides for placement of hoyer lift pad per RN request; pt uncomfortable initially but reports improved comfort after RN and PTA slide her higher up in chair x2 reps with totalA +2    Transfers Overall transfer level: Needs assistance Equipment used: 2 person hand held assist Transfers: Sit to/from Stand, Bed to chair/wheelchair/BSC Sit to Stand: Mod assist           General transfer comment: defer; pt agreeable to sit in chair a bit longer after hoyer pad placed under her    Ambulation/Gait               General Gait Details: defer, pt too  fatigued/weak   Stairs             Engineer, drilling Rankin (Stroke Patients Only)       Balance Overall balance assessment: Needs assistance Sitting-balance support: No upper extremity  supported, Feet supported Sitting balance-Leahy Scale: Fair Sitting balance - Comments: R lateral lean while sitting which pt attributes to scoliosis   Standing balance support: Single extremity supported, Bilateral upper extremity supported, Reliant on assistive device for balance Standing balance-Leahy Scale: Zero Standing balance comment: pt unable at this time, too fatigued from earlier standing                            Cognition Arousal/Alertness: Awake/alert Behavior During Therapy: Flat affect, Anxious Overall Cognitive Status: Within Functional Limits for tasks assessed                                 General Comments: WFL for simple tasks, very flat with decreased volume,        Exercises Other Exercises Other Exercises: seated BLE AROM: LAQ, ankle pumps, hip flexion, hip ADduction pillow squeezes x10 reps ea Other Exercises: supine BLE AROM: ankle pumps x10, SLR (AA x5 only)    General Comments General comments (skin integrity, edema, etc.): Pt instructed on importance of elevating chair to full upright prior to taking sips of water and taking small sips as PTA noticed some coughing after large sip of water. Pt repositioned with hoyer lift pad behind her for safety and pt agreeable to sit up in chair a bit longer for strengthening.      Pertinent Vitals/Pain Pain Assessment Pain Assessment: Faces Faces Pain Scale: Hurts whole lot Pain Location: bottom and peri area Pain Descriptors / Indicators: Discomfort, Sore, Grimacing, Moaning Pain Intervention(s): Limited activity within patient's tolerance, Monitored during session, Repositioned, Patient requesting pain meds-RN notified, Other (comment) (purewick repositioned along with surgical briefs)     PT Goals (current goals can now be found in the care plan section) Acute Rehab PT Goals Patient Stated Goal: agreeable to post-acute rehab to regain strength PT Goal Formulation: With  patient Time For Goal Achievement: 05/29/22 Progress towards PT goals: Progressing toward goals    Frequency    Min 1X/week      PT Plan Current plan remains appropriate       AM-PAC PT "6 Clicks" Mobility   Outcome Measure  Help needed turning from your back to your side while in a flat bed without using bedrails?: A Lot Help needed moving from lying on your back to sitting on the side of a flat bed without using bedrails?: A Lot Help needed moving to and from a bed to a chair (including a wheelchair)?: Total Help needed standing up from a chair using your arms (e.g., wheelchair or bedside chair)?: Total Help needed to walk in hospital room?: Total Help needed climbing 3-5 steps with a railing? : Total 6 Click Score: 8    End of Session Equipment Utilized During Treatment: Other (comment) (bed pads) Activity Tolerance: Patient limited by fatigue;Patient limited by pain;Other (comment) (soft BP) Patient left: in chair;with call bell/phone within reach;with chair alarm set Nurse Communication: Mobility status;Need for lift equipment;Other (comment) (quick to fatigue, soft BP, LUE edema, bleeding at incision in abd; may benefit from palliative consult) PT Visit Diagnosis: Other abnormalities of gait and mobility (R26.89);Muscle weakness (generalized) (  M62.81)     Time: 5409-8119 PT Time Calculation (min) (ACUTE ONLY): 9 min  Charges:  $Therapeutic Activity: 8-22 mins                     Cuthbert Turton P., PTA Acute Rehabilitation Services Secure Chat Preferred 9a-5:30pm Office: [phone]    Dorathy Kinsman [facility] 05/18/2022, 5:04 PM

## 2022-05-18 NOTE — Care Management Important Message (Signed)
Important Message  Patient Details  Name: Chelsea Garcia MRN: [id number] Date of Birth: 26-Mar-1933   Medicare Important Message Given:  Yes     Renie Ora 05/18/2022, 9:22 AM

## 2022-05-18 NOTE — Progress Notes (Signed)
Pt complaining of abdominal pain in left quadrant - colostomy with 150 ml output.  Abdomen distended and soft - no BS.  PA notified by page.  Will continue to monitor.

## 2022-05-18 NOTE — TOC Progression Note (Signed)
Transition of Care [facility]) - Progression Note    Patient Details  Name: Chelsea Garcia MRN: [id number] Date of Birth: 25-Jan-1933  Transition of Care [facility]) CM/SW Contact  Eduard Roux, [place] Phone Number: 05/18/2022, 4:35 PM  Clinical Narrative:     CSW spoke with patient' daughter in law, provided bed offers. Verbally informed of Medicare.gov star rating.   Called [facility] and [facility] to see if they can offer- waiting on response  Antony Blackbird, MSW, LCSW Clinical Social Worker    Expected Discharge Plan: Skilled Nursing Facility Barriers to Discharge: English as a second language teacher, Continued Medical Work up  [facility] In-house Referral: Clinical Social Work     Living arrangements for the past 2 months: Single Family Home                                       Social Determinants of Health (SDOH) Interventions SDOH Screenings   Food Insecurity: No Food Insecurity (06/04/2022)  Housing: Low Risk  (06/07/2022)  Transportation Needs: No Transportation Needs (05/24/2022)  Utilities: Not At Risk (05/29/2022)  Depression (PHQ2-9): Medium Risk (04/13/2020)  Tobacco Use: Medium Risk (05/16/2022)    Readmission Risk Interventions     No data to display

## 2022-05-18 NOTE — Progress Notes (Signed)
Occupational Therapy Treatment Patient Details Name: Chelsea Garcia MRN: [id number] DOB: 09-02-1933 Today's Date: 05/18/2022   History of present illness Pt is an 89 y.o. female admitted 05/23/2022 with abdominal pain; workup for suspected perforated hollow viscus-most likely sigmoid colon. S/p ex lap with low anterior resection, end colostomy on 5/4. Of note, recent admission for R proximal humerus fx conervative management (03/2022); pt states she did not go to SNF. Other PMH includes cellulitis, osteopenia, essential tremor, LBP, glaucoma.   OT comments  Patient with decrease in functional activity and mobility in session to date. Patient agreeable to OOB, however requiring max A to come to EOB to maintain RUE safety.  Patient able to tolerate 2-3 minutes sitting EOB due to weakness and pain in abdomen. OT using RW as a placeholder for safety, but OT maintaining close observation of RUE due to precautions. Patient requiring max A to come into standing, with minimal initiation noted. Patient with minimal to no advancement of steps, and attempting to sit too soon, therefore OT pivoting patient to recliner. Patient requiring increased time to position herself in the recliner, but then unable to adjust and scoot without mod A from OT to bring trunk forward but still unsuccessful. PTA arriving, with OT and PTA having to scoot patient back into the recliner due to fatigue. Patient handed off to PTA at end of session, with OT informing RN for the need of list equipment to return back to bed due to decline since last visit. Discharge currently remains appropriate, OT will continue to follow.    Recommendations for follow up therapy are one component of a multi-disciplinary discharge planning process, led by the attending physician.  Recommendations may be updated based on patient status, additional functional criteria and insurance authorization.    Assistance Recommended at Discharge Frequent or constant  Supervision/Assistance  Patient can return home with the following  A lot of help with walking and/or transfers;A lot of help with bathing/dressing/bathroom;Assistance with cooking/housework;Help with stairs or ramp for entrance;Assist for transportation   Equipment Recommendations  Other (comment) (defer to next venue)    Recommendations for Other Services      Precautions / Restrictions Precautions Precautions: Fall;Other (comment) Precaution Comments: Abdominal precs for comfort s/p colostomy; sacral wound; h/o R proximal humerus fx with conservative management (03/2022); LUE edema Restrictions Weight Bearing Restrictions: Yes RUE Weight Bearing: Non weight bearing Other Position/Activity Restrictions: quad cane baseline, RUE NWB for transfers       Mobility Bed Mobility Overal bed mobility: Needs Assistance Bed Mobility: Supine to Sit     Supine to sit: Max assist     General bed mobility comments: max A to come into sitting EOB, patient rolling to R with OT protecting RUE therefore increased assist needed    Transfers Overall transfer level: Needs assistance Equipment used: Rolling walker (2 wheels) Transfers: Sit to/from Stand, Bed to chair/wheelchair/BSC Sit to Stand: Max assist     Step pivot transfers: Max assist     General transfer comment: RW used as a Print production planner for safety, but OT maintaining close observation of RUE due to precations. Patient requiring max A to come into standing, with minimal initiation noted. Patient with minimal to no advancement of steps, and attempting to sit too soon, therefore OT pivoting patient to recliner.     Balance Overall balance assessment: Needs assistance Sitting-balance support: No upper extremity supported, Feet supported Sitting balance-Leahy Scale: Fair Sitting balance - Comments: R lateral lean while sitting which pt attributes to  scoliosis   Standing balance support: Bilateral upper extremity supported, Reliant on  assistive device for balance Standing balance-Leahy Scale: Zero Standing balance comment: heavily reliant on OT to date                           ADL either performed or assessed with clinical judgement   ADL Overall ADL's : Needs assistance/impaired Eating/Feeding: NPO Eating/Feeding Details (indicate cue type and reason): except ice chips Grooming: Wash/dry hands;Wash/dry face;Set up;Sitting                   Toilet Transfer: Maximal assistance;Stand-pivot;Rolling walker (2 wheels) Toilet Transfer Details (indicate cue type and reason): RW used as a placeholder for safety, but OT maintaining close observation of RUE for safety. Patient with minimal to no advancement of steps, and attempting to sit too soon, therefore OT pivoting patient to recliner.         Functional mobility during ADLs: Maximal assistance;Rolling walker (2 wheels) General ADL Comments: Session focus on OOB activity and increasing activity tolerance    Extremity/Trunk Assessment              Vision       Perception     Praxis      Cognition Arousal/Alertness: Awake/alert Behavior During Therapy: Flat affect Overall Cognitive Status: Within Functional Limits for tasks assessed                                 General Comments: WFL for simple tasks, very flat with decreased volume,        Exercises      Shoulder Instructions       General Comments      Pertinent Vitals/ Pain       Pain Assessment Pain Assessment: Faces Faces Pain Scale: Hurts little more Pain Location: bottom Pain Descriptors / Indicators: Discomfort, Sore  Home Living                                          Prior Functioning/Environment              Frequency  Min 2X/week        Progress Toward Goals  OT Goals(current goals can now be found in the care plan section)  Progress towards OT goals: Progressing toward goals  Acute Rehab OT Goals Patient  Stated Goal: to be in less pain OT Goal Formulation: With patient Time For Goal Achievement: 05/29/22 Potential to Achieve Goals: Fair  Plan Discharge plan remains appropriate    Co-evaluation                 AM-PAC OT "6 Clicks" Daily Activity     Outcome Measure   Help from another person eating meals?: Total Help from another person taking care of personal grooming?: A Little Help from another person toileting, which includes using toliet, bedpan, or urinal?: Total Help from another person bathing (including washing, rinsing, drying)?: Total Help from another person to put on and taking off regular upper body clothing?: Total Help from another person to put on and taking off regular lower body clothing?: Total 6 Click Score: 8    End of Session Equipment Utilized During Treatment: Gait belt;Rolling walker (2 wheels)  OT Visit Diagnosis: Other abnormalities of  gait and mobility (R26.89);Muscle weakness (generalized) (M62.81);Pain Pain - Right/Left:  (Abdomen) Pain - part of body:  (Abdomen)   Activity Tolerance Patient limited by fatigue   Patient Left in chair;with call bell/phone within reach;Other (comment) (with PTA present)   Nurse Communication Need for lift equipment;Mobility status        Time: 1505-1530 OT Time Calculation (min): 25 min  Charges: OT General Charges $OT Visit: 1 Visit OT Treatments $Self Care/Home Management : 23-37 mins  Pollyann Glen E. Matteson Blue, OTR/L Acute Rehabilitation Services [phone]   Chelsea Garcia 05/18/2022, 4:42 PM

## 2022-05-18 NOTE — Progress Notes (Addendum)
4 Days Post-Op  Subjective: CC: NGT out yesterday. Tolerating cld and finishing most of her trays + boost breeze without n/v but is having some distension. Continues to have ostomy output, 150cc/24 hours. Some pain in the llq that she thinks is 2/2 subq heparin although RN said it was given in hips. Did not get oob yesterday. Voiding without complaints.   Objective: Vital signs in last 24 hours: Temp:  [97.9 F (36.6 C)-98 F (36.7 C)] 98 F (36.7 C) (05/08 0734) Pulse Rate:  [64-74] 66 (05/08 0734) Resp:  [20-22] 21 (05/08 0734) BP: (95-110)/(54-58) 105/54 (05/08 0734) SpO2:  [91 %-94 %] 92 % (05/08 0734) Weight:  [49 kg] 49 kg (05/08 0500) Last BM Date : 05/17/22  Intake/Output from previous day: 05/07 0701 - 05/08 0700 In: 240 [P.O.:240] Out: -  Intake/Output this shift: Total I/O In: -  Out: 550 [Urine:400; Stool:150]  PE: Gen:  Alert, NAD, pleasant Card:  Reg Pulm:  CTAB, no W/R/R, effort normal. On RA. Pulling 250-500 on IS.  Abd: Mod distension but very soft, appropriately tender around incisions, no rigidity or guarding and otherwise NT (no LLQ ttp). + BS. Stoma viable. Air and dark semi-solid stool in ostomy bag. Midline wound cdi with staples present and no drainage.  Ext:  No LE edema. MAE's Psych: A&Ox3   Lab Results:  Recent Labs    05/17/22 0133 05/18/22 0106  WBC 25.1* 21.4*  HGB 8.9* 9.2*  HCT 29.1* 29.8*  PLT 260 262    BMET Recent Labs    05/17/22 0133 05/18/22 0106  NA 137 136  K 4.0 3.6  CL 110 109  CO2 17* 20*  GLUCOSE 100* 119*  BUN 41* 38*  CREATININE 1.60* 1.38*  CALCIUM 8.3* 8.1*    PT/INR No results for input(s): "LABPROT", "INR" in the last 72 hours.  CMP     Component Value Date/Time   NA 136 05/18/2022 0106   NA 142 09/16/2016 0905   K 3.6 05/18/2022 0106   K 4.0 09/16/2016 0905   CL 109 05/18/2022 0106   CO2 20 (L) 05/18/2022 0106   CO2 23 09/16/2016 0905   GLUCOSE 119 (H) 05/18/2022 0106   GLUCOSE 107  09/16/2016 0905   BUN 38 (H) 05/18/2022 0106   BUN 18.3 09/16/2016 0905   CREATININE 1.38 (H) 05/18/2022 0106   CREATININE 1.40 (H) 02/21/2022 0916   CREATININE 0.8 09/16/2016 0905   CALCIUM 8.1 (L) 05/18/2022 0106   CALCIUM 9.8 09/16/2016 0905   PROT 6.5 05/31/2022 1717   PROT 7.1 09/16/2016 0905   ALBUMIN 3.3 (L) 06/06/2022 1717   ALBUMIN 4.2 09/16/2016 0905   AST 14 (L) 06/08/2022 1717   AST 12 (L) 02/21/2022 0916   AST 15 09/16/2016 0905   ALT 9 06/05/2022 1717   ALT 10 02/21/2022 0916   ALT 9 09/16/2016 0905   ALKPHOS 74 05/13/2022 1717   ALKPHOS 49 09/16/2016 0905   BILITOT 0.6 05/19/2022 1717   BILITOT 0.5 02/21/2022 0916   BILITOT 0.79 09/16/2016 0905   GFRNONAA 37 (L) 05/18/2022 0106   GFRNONAA 37 (L) 02/21/2022 0916   GFRAA >60 07/17/2019 1005   Lipase     Component Value Date/Time   LIPASE 39 04/08/2021 1600    Studies/Results: No results found.  Anti-infectives: Anti-infectives (From admission, onward)    Start     Dose/Rate Route Frequency Ordered Stop   05/15/22 1900  ceFEPIme (MAXIPIME) 2 g in sodium chloride 0.9 %  100 mL IVPB  Status:  Discontinued        2 g 200 mL/hr over 30 Minutes Intravenous Every 24 hours 06/04/2022 1819 05/15/22 0109   05/15/22 0600  piperacillin-tazobactam (ZOSYN) IVPB 2.25 g        2.25 g 100 mL/hr over 30 Minutes Intravenous Every 8 hours 05/15/22 0302     06/01/2022 1630  ceFEPIme (MAXIPIME) 2 g in sodium chloride 0.9 % 100 mL IVPB        2 g 200 mL/hr over 30 Minutes Intravenous  Once 05/18/2022 1621 05/24/2022 1856   06/01/2022 1630  metroNIDAZOLE (FLAGYL) IVPB 500 mg        500 mg 100 mL/hr over 60 Minutes Intravenous  Once 05/31/2022 1621 05/15/2022 1856        Assessment/Plan POD 3 s/p exploratory laparotomy, LAR, end colostomy for perforated sigmoid diverticulitis with phlegm by Dr. Cliffton Asters on 5/5 - Path pending - Although tolerating cld without n/v and having ostomy output, will back down to sips/chips today with increased  distension on exam. Asked RN to let me know if develops any worsening distension, pain or any n/v. If develops those will plan xray, possible NGT.   - Cont abx, plan 5d post op - WOC RN consult for new ostomy. Plan ostomy clinic referral at d/c.  - IS/pulm toilet - PT/OT. Plan SNF  FEN: Back down to sips cld. Boost breeze. Cont IVF 75 mL/hr. BP improved.  VTE: SCD's, SQH q 8h ID: cefepime/flagyl 5/4. Zosyn 5/5 >> continue 5 days post-op. Afebrile. WBC 60.4 > 33.2 > 25.1 > 21.4 Foley: out, voiding. Monitor I/O.  Dispo: SDU    UTI - Ucx from 5/4 w/ proteus mirabilis. Sens to zosyn Acute on chronic anemia - may be ABL anemia and/or dilutional. Hgb stable at 9.2 R humerus fx - 3/21. Per notes, Dr. Carola Frost planned non-op management with sling and NWB. Reached out to ortho to let her know she was here and get updated recs. RUE NWB and no resistance exercises to RUE (the shoulder specifically) but has passive and active assistive motion of the shoulder. no restrictions from her elbow, forearm, wrist or hand. Looks like they are getting CT today. Defer results and f/u of this to their team. OT Hepatosplenomegaly ?CKD - Creatinine 1.38-1.70 last 2 mos. Improved to 1.38. Appears at baseline. PCP f/u.  GERD Endometriosis Thrombocytosis (follows with Dr. Mosetta Putt) - Plt 262 Sacral decubitus ulcer/pressure injuries - WOCN consult. Medihoney.    LOS: 3 days    Jacinto Halim , [facility] Surgery 05/18/2022, 8:53 AM Please see Amion for pager number during day hours 7:00am-4:30pm

## 2022-05-18 NOTE — Consult Note (Signed)
WOC Nurse ostomy follow up Stoma type/location: LMQ colostomy  Stomal assessment/size: 1 1/2" edematous, well budded, pale pink and moist  Peristomal assessment: intact, crease noted at 9 o'clock (barrier ring does a good job of smoothing out this area) Treatment options for stomal/peristomal skin: 2" barrier ring  Output 100 mls green liquid stool  Ostomy pouching: 2 pc 2 3/4" Skin barrier [place] #2) 2 3/4" pouch [place] [phone]) and 2" skin barrier ring [place] [phone]) Education provided: Daughter in law present for today's teaching session. Patient sleeping and did not wake during teaching session.  Plan is for patient to be discharged to rehab but after leaving rehab will return to live with son and daughter in law.  Went over Transport planner with daughter in law, including  step by step instructions for changing a 2 piece pouching system  Also reviewed educational videos.  Discussed with Daughter in law emptying pouch when 1/3 to 1/2 full.  We also discussed changing entire pouching system twice a week and as needed for leaking. Reviewed burping bag but also that once they begin ordering supplies bags will be filtered.  Daughter in law is able to empty pouch using lock and roll mechanism and clean spout with toilet paper wick.  She then removes pouch and we measure stoma at 1 1/2".  We discussed that normally we ask for stoma to be sized with each pouch change for first month due to edema resolving.  Patient is likely to be in rehab throughout that time.  We discussed cleaning around the stoma with water moistened washcloth only and that stoma is insensate, may bleed when cleansed but this is a normal finding.  Had daughter in law cut new skin barrier at 1 1/2", stretch 2" barrier ring to fit around stoma and place new skin barrier to barrier ring.  Daughter in law is able to roll new pouch closed.    Discussed showering when patient gets home, that she may shower with pouch on or if it is a day  for pouch to be changed she may remove pouch, shower, dry skin thoroughly and replace.  We also discussed no dietary modifications necessary for colostomy.  I did mark pages in [employer] with pouching system we used today in addition to stoma powder.  Daughter in law asked that I send [employer] her email which I will do, [email or web address].   Enrolled patient in [place] Secure Start Discharge program: Yes, on previous visit   Patient has 4 sets of 2 3/4" pouches, skin barriers, and 2" barrier rings in room.  WOC team will follow patient for ostomy education and support.   WOC Nurse wound follow up;assisted bedside nurse in changing dressing  Wound type:Unstageable Pressure Injury Coccyx  Measurement: 2 cms x 1 cm x 0.3 cm  Wound bed: 50% yellow slough 50% pink moist  Drainage (amount, consistency, odor) minimal serosanguinous  Periwound: maroon discoloration to sacrococcygeal area as previously noted  Dressing procedure/placement/frequency:Continue Medihoney as previously ordered.   WOC team will follow as above.   Thank you,    Priscella Mann MSN, RN-BC, [employer] [phone]

## 2022-05-18 NOTE — Progress Notes (Signed)
Physical Therapy Treatment Patient Details Name: Chelsea Garcia MRN: [id number] DOB: Sep 21, 1933 Today's Date: 05/18/2022   History of Present Illness Pt is an 89 y.o. female admitted 05/25/2022 with abdominal pain; workup for suspected perforated hollow viscus-most likely sigmoid colon. S/p ex lap with low anterior resection, end colostomy on 5/4. Of note, recent admission for R proximal humerus fx conervative management (03/2022); pt states she did not go to SNF. Other PMH includes cellulitis, osteopenia, essential tremor, LBP, glaucoma.    PT Comments    Pt received in chair in care of OT, agreeable to limited therapy session from chair, pt too fatigued to stand fully upright but able to perform partial squat to adjust chair pad/lines behind her and for scooting back further into chair needing +1-2 modA. Pt also performed seated/supine BLE exercises with encouragement, pt c/o severe fatigue after ~23 mins of seated tasks and requesting to recline further to rest. Pt continues to benefit from PT services to progress toward functional mobility goals, pt with functional decline since eval and may benefit from palliative consult, PA notified via secure chat.   Recommendations for follow up therapy are one component of a multi-disciplinary discharge planning process, led by the attending physician.  Recommendations may be updated based on patient status, additional functional criteria and insurance authorization.  Follow Up Recommendations  Can patient physically be transported by private vehicle: No (not at current level; unable to ambulate with +1 assist)    Assistance Recommended at Discharge Frequent or constant Supervision/Assistance  Patient can return home with the following A lot of help with walking and/or transfers;A lot of help with bathing/dressing/bathroom;Assistance with cooking/housework;Assist for transportation;Help with stairs or ramp for entrance   Equipment Recommendations  Other  (comment) (defer to next venue; currently would need air bed, mechanical lift, wheelchair with reclining back rest and elevating removable leg rests)    Recommendations for Other Services OT consult     Precautions / Restrictions Precautions Precautions: Fall;Other (comment) Precaution Comments: Abdominal precs for comfort s/p colostomy; sacral wound; h/o R proximal humerus fx with conservative management (03/2022); LUE edema Restrictions Weight Bearing Restrictions: Yes RUE Weight Bearing: Non weight bearing Other Position/Activity Restrictions: quad cane baseline, RUE NWB for transfers     Mobility  Bed Mobility Overal bed mobility: Needs Assistance             General bed mobility comments: pt received seated forward in chair in care of OT    Transfers Overall transfer level: Needs assistance Equipment used: 2 person hand held assist Transfers: Sit to/from Stand, Bed to chair/wheelchair/BSC Sit to Stand: Mod assist           General transfer comment: partial stand from chair with +2 modA for safety and assist with bed pad to scoot hips back into chair further; pt too fatigued to stand fully upright    Ambulation/Gait               General Gait Details: pt c/o severe fatigue after pivot to chair with OT assist, defer gait for pt safety. BP soft with legs down in chair   Stairs             Wheelchair Mobility    Modified Rankin (Stroke Patients Only)       Balance Overall balance assessment: Needs assistance Sitting-balance support: No upper extremity supported, Feet supported Sitting balance-Leahy Scale: Fair Sitting balance - Comments: R lateral lean while sitting which pt attributes to scoliosis   Standing balance  support: Single extremity supported, Bilateral upper extremity supported, Reliant on assistive device for balance Standing balance-Leahy Scale:  (Poor to Zero) Standing balance comment: poor static standing, zero dynamic  standing                            Cognition Arousal/Alertness: Awake/alert Behavior During Therapy: Flat affect Overall Cognitive Status: Within Functional Limits for tasks assessed                                 General Comments: WFL for simple tasks, very flat with decreased volume,        Exercises Other Exercises Other Exercises: seated BLE AROM: LAQ, ankle pumps, hip flexion, hip ADduction pillow squeezes x10 reps ea Other Exercises: supine BLE AROM: ankle pumps x10, SLR (AA x5 only)    General Comments General comments (skin integrity, edema, etc.): BP 91/50 (63) seated in chair after pivot with feet on floor, BP 98/57 (70) reclined in chair taken in LUE (RUE fx); L elbow edema, sacral wound (unable to visualize due to pt fatigue unable to stand fully upright), scant bright red bleeding from abdominal incision around staples, RN notified. HR 70's bpm SpO2 93% on RA      Pertinent Vitals/Pain Pain Assessment Pain Assessment: Faces Faces Pain Scale: Hurts little more Pain Location: bottom Pain Descriptors / Indicators: Discomfort, Sore, Grimacing Pain Intervention(s): Monitored during session, Limited activity within patient's tolerance, Repositioned    Home Living                          Prior Function            PT Goals (current goals can now be found in the care plan section) Acute Rehab PT Goals Patient Stated Goal: agreeable to post-acute rehab to regain strength PT Goal Formulation: With patient Time For Goal Achievement: 05/29/22 Progress towards PT goals: Not progressing toward goals - comment (functional decline)    Frequency    Min 1X/week      PT Plan Current plan remains appropriate    Co-evaluation              AM-PAC PT "6 Clicks" Mobility   Outcome Measure  Help needed turning from your back to your side while in a flat bed without using bedrails?: A Lot Help needed moving from lying on your  back to sitting on the side of a flat bed without using bedrails?: A Lot Help needed moving to and from a bed to a chair (including a wheelchair)?: A Lot Help needed standing up from a chair using your arms (e.g., wheelchair or bedside chair)?: A Lot Help needed to walk in hospital room?: Total Help needed climbing 3-5 steps with a railing? : Total 6 Click Score: 10    End of Session Equipment Utilized During Treatment: Gait belt Activity Tolerance: Patient limited by fatigue;Patient limited by pain;Other (comment) (soft BP) Patient left: in chair;with call bell/phone within reach;with chair alarm set Nurse Communication: Mobility status;Need for lift equipment;Other (comment) (quick to fatigue, soft BP, LUE edema, bleeding at incision in abd; may benefit from palliative consult) PT Visit Diagnosis: Other abnormalities of gait and mobility (R26.89);Muscle weakness (generalized) (M62.81)     Time: 1478-2956 PT Time Calculation (min) (ACUTE ONLY): 28 min  Charges:  $Therapeutic Exercise: 8-22 mins $Therapeutic Activity: 8-22 mins  Florina Ou., PTA Acute Rehabilitation Services Secure Chat Preferred 9a-5:30pm Office: [phone]    Dorathy Kinsman [facility] 05/18/2022, 4:51 PM

## 2022-05-18 NOTE — Progress Notes (Signed)
OT Cancellation Note  Patient Details Name: Chelsea Garcia MRN: [id number] DOB: 1933-06-09   Cancelled Treatment:    Reason Eval/Treat Not Completed: Fatigue/lethargy limiting ability to participate. Pt politely declined and requested OT return later. OT will check back later as time allows or tomorrow  Galen Manila 05/18/2022, 2:13 PM

## 2022-05-19 LAB — CBC
HCT: 38.6 % (ref 36.0–46.0)
Hemoglobin: 11.5 g/dL — ABNORMAL LOW (ref 12.0–15.0)
MCH: 28.5 pg (ref 26.0–34.0)
MCHC: 29.8 g/dL — ABNORMAL LOW (ref 30.0–36.0)
MCV: 95.5 fL (ref 80.0–100.0)
Platelets: 336 10*3/uL (ref 150–400)
RBC: 4.04 MIL/uL (ref 3.87–5.11)
RDW: 16.5 % — ABNORMAL HIGH (ref 11.5–15.5)
WBC: 43.1 10*3/uL — ABNORMAL HIGH (ref 4.0–10.5)
nRBC: 0.1 % (ref 0.0–0.2)

## 2022-05-19 LAB — BASIC METABOLIC PANEL
Anion gap: 9 (ref 5–15)
BUN: 33 mg/dL — ABNORMAL HIGH (ref 8–23)
CO2: 20 mmol/L — ABNORMAL LOW (ref 22–32)
Calcium: 8.4 mg/dL — ABNORMAL LOW (ref 8.9–10.3)
Chloride: 108 mmol/L (ref 98–111)
Creatinine, Ser: 1.51 mg/dL — ABNORMAL HIGH (ref 0.44–1.00)
GFR, Estimated: 33 mL/min — ABNORMAL LOW (ref >60)
Glucose, Bld: 106 mg/dL — ABNORMAL HIGH (ref 70–99)
Potassium: 4 mmol/L (ref 3.5–5.1)
Sodium: 137 mmol/L (ref 135–145)

## 2022-05-19 LAB — CULTURE, BLOOD (ROUTINE X 2): Special Requests: ADEQUATE

## 2022-05-19 LAB — URINE CULTURE: Culture: >=100000 — AB

## 2022-05-19 MED ORDER — SODIUM CHLORIDE 0.9 % IV BOLUS
500.0000 mL | Freq: Once | INTRAVENOUS | Status: AC
  Administered 2022-05-19: 500 mL via INTRAVENOUS

## 2022-05-19 NOTE — Progress Notes (Signed)
Physical Therapy Treatment Patient Details Name: Chelsea Garcia MRN: [id number] DOB: May 16, 1933 Today's Date: 05/19/2022   History of Present Illness Pt is an 89 y.o. female admitted 05/25/2022 with abdominal pain; workup for suspected perforated hollow viscus-most likely sigmoid colon. S/p ex lap with low anterior resection, end colostomy on 5/4. Of note, recent admission for R proximal humerus fx conervative management (03/2022); pt states she did not go to SNF. Other PMH includes cellulitis, osteopenia, essential tremor, LBP, glaucoma.    PT Comments    Pt slouched down supine in bed with bloated stomach on entry, reports she would like to get out of bed to recliner. Pt require increased assist of 2 to come to EoB, modAx2 for standing in [place] to transfer to recliner. Once up in recliner pt with reports of soreness on her bottom and increasingly scooting down in chair to find more comfortable positioning. Ultimately pt requires return to bed as even with repositioning she continues to scoot down in recliner. Pt is maxAx2 for standing and total A for return to bed. Pt has had marked decline in abilities since this therapist saw her 4 days ago. D/c plans remain appropriate, goal have been reviewed and downgrade. PT will continue to follow acutely.   Recommendations for follow up therapy are one component of a multi-disciplinary discharge planning process, led by the attending physician.  Recommendations may be updated based on patient status, additional functional criteria and insurance authorization.  Follow Up Recommendations  Can patient physically be transported by private vehicle: Yes    Assistance Recommended at Discharge Frequent or constant Supervision/Assistance  Patient can return home with the following A lot of help with walking and/or transfers;A lot of help with bathing/dressing/bathroom;Assistance with cooking/housework;Assist for transportation;Help with stairs or ramp for entrance    Equipment Recommendations  Other (comment) (defer to next venue)    Recommendations for Other Services OT consult     Precautions / Restrictions Precautions Precautions: Fall;Other (comment) Precaution Comments: NG tube, abdominal for comfort s/p colostomy; sacral wound; h/o R proximal humerus fx with conservative management (03/2022) Restrictions Weight Bearing Restrictions: Yes RUE Weight Bearing: Non weight bearing Other Position/Activity Restrictions: Per dtr-in-law on phone pt is still NWB'ing and should only be moving arm from elbow distally, does not need to wear sling     Mobility  Bed Mobility Overal bed mobility: Needs Assistance Bed Mobility: Supine to Sit, Sit to Supine     Supine to sit: Max assist Sit to supine: Total assist   General bed mobility comments: pt able to manage LE to EoB but requires assist with pad to bring trun to upright and for scooting hips to EoB. with return to bed pt unable to maintain upright posture after sitting from [place] and trunk lower straight back in bed. Pt requires total A for bringing LE back into bed and repositioning in bed.    Transfers Overall transfer level: Needs assistance Equipment used: Ambulation equipment used Transfers: Sit to/from Stand, Bed to chair/wheelchair/BSC Sit to Stand: Mod assist, +2 physical assistance, Max assist           General transfer comment: mod Ax2 for coming to stand in [place] with support on R side to keep from bearing weight through R shoulder, able to stand from pads of [place], with return to bed requires maxAx2 for coming up from lower recliner    Ambulation/Gait               General Gait Details: unable  Balance Overall balance assessment: Needs assistance Sitting-balance support: No upper extremity supported, Feet supported Sitting balance-Leahy Scale: Fair Sitting balance - Comments: R lateral lean while sitting which pt attributes to scoliosis   Standing balance  support: Single extremity supported, Bilateral upper extremity supported, Reliant on assistive device for balance Standing balance-Leahy Scale: Poor                              Cognition Arousal/Alertness: Awake/alert Behavior During Therapy: Flat affect Overall Cognitive Status: Within Functional Limits for tasks assessed                                 General Comments: decreased awareness of positioning  very flat with decreased volume,        Exercises Other Exercises Other Exercises: instructed pt in hand, wrist, and forearm exercises for RUE to do on her own while in bed--she returned demonstrated and verbalized understanding. Also place a blanket roll underneath the right side of her pillow due to she tends to keep her neck rotated to the right at rest--explained to her why I was doing this and she verbalized understanding.    General Comments General comments (skin integrity, edema, etc.): Pt slumped down in bed on entry, eager to get out of bed. Once in recliner pt reports feeling more comfortable in chair but as leads and IV being organized pt had scooted down in recliner to exact same positioning as in bed. Re positioned her with pillows and pt immediately started scooting down in chair, attempted education that scooting puts her back in same uncomfortable position. Ultimately pt needs to return to bed due to safety of sliding out of recliner      Pertinent Vitals/Pain Pain Assessment Pain Assessment: Faces Faces Pain Scale: Hurts whole lot Pain Location: bottom Pain Descriptors / Indicators: Discomfort, Sore Pain Intervention(s): Limited activity within patient's tolerance, Monitored during session, Repositioned     PT Goals (current goals can now be found in the care plan section) Acute Rehab PT Goals Patient Stated Goal: agreeable to post-acute rehab to regain strength PT Goal Formulation: With patient Time For Goal Achievement:  05/29/22 Potential to Achieve Goals: Good Progress towards PT goals: Not progressing toward goals - comment    Frequency    Min 1X/week      PT Plan Current plan remains appropriate       AM-PAC PT "6 Clicks" Mobility   Outcome Measure  Help needed turning from your back to your side while in a flat bed without using bedrails?: A Lot Help needed moving from lying on your back to sitting on the side of a flat bed without using bedrails?: Total Help needed moving to and from a bed to a chair (including a wheelchair)?: Total Help needed standing up from a chair using your arms (e.g., wheelchair or bedside chair)?: Total Help needed to walk in hospital room?: Total Help needed climbing 3-5 steps with a railing? : Total 6 Click Score: 7    End of Session Equipment Utilized During Treatment: Gait belt Activity Tolerance: Patient tolerated treatment well;Patient limited by fatigue Patient left: in chair;with call bell/phone within reach;with chair alarm set Nurse Communication: Mobility status PT Visit Diagnosis: Other abnormalities of gait and mobility (R26.89);Muscle weakness (generalized) (M62.81)     Time: 1610-9604 PT Time Calculation (min) (ACUTE ONLY): 45 min  Charges:  $  Therapeutic Activity: 38-52 mins                     Yamilex Borgwardt B. Beverely Risen PT, DPT Acute Rehabilitation Services Please use secure chat or  Call Office [phone]    Elon Alas Fleet 05/19/2022, 1:49 PM

## 2022-05-19 NOTE — Progress Notes (Signed)
Placed in and out cath only after bladder scan and pt stating she felt like she needed to pee. obtained 15ml output. Vaginal area very red with yeast.   Everlean Cherry, RN

## 2022-05-19 NOTE — TOC Progression Note (Signed)
Transition of Care [facility]) - Progression Note    Patient Details  Name: Chelsea Garcia MRN: [id number] Date of Birth: 12/04/1933  Transition of Care [facility]) CM/SW Contact  Erin Sons, [place] Phone Number: 05/19/2022, 11:03 AM  Clinical Narrative:     CSW met with pt and pt's DIL to discuss SNF choice. CSW explained that neither [facility] or [facility] can offer a bed. DIL states that she is planning to visit facilities today from the list of bed offers she originally received. She will follow up with CSW with choice after touring facilities.   Expected Discharge Plan: Skilled Nursing Facility Barriers to Discharge: English as a second language teacher, Continued Medical Work up  Expected Discharge Plan and Services In-house Referral: Clinical Social Work     Living arrangements for the past 2 months: Single Family Home                                       Social Determinants of Health (SDOH) Interventions SDOH Screenings   Food Insecurity: No Food Insecurity (05/14/2022)  Housing: Low Risk  (05/14/2022)  Transportation Needs: No Transportation Needs (05/14/2022)  Utilities: Not At Risk (05/14/2022)  Depression (PHQ2-9): Medium Risk (04/13/2020)  Tobacco Use: Medium Risk (05/16/2022)    Readmission Risk Interventions     No data to display

## 2022-05-19 NOTE — Progress Notes (Signed)
Patient ID: Chelsea Garcia, female   DOB: Feb 01, 1933, 89 y.o.   MRN: [id number]             5 Days Post-Op    Subjective: Taking clears RN reports low U/O ROS negative except as listed above. Objective: Vital signs in last 24 hours: Temp:  [97.2 F (36.2 C)-98.2 F (36.8 C)] 97.5 F (36.4 C) (05/09 0750) Pulse Rate:  [64-78] 77 (05/09 0750) Resp:  [19-21] 20 (05/09 0750) BP: (92-119)/(43-65) 95/53 (05/09 0750) SpO2:  [92 %-95 %] 93 % (05/09 0750) Last BM Date : 05/18/22  Intake/Output from previous day: 05/08 0701 - 05/09 0700 In: 1931.4 [I.V.:1781.4; IV Piggyback:149.9] Out: 800 [Urine:650; Stool:150] Intake/Output this shift: No intake/output data recorded.  General appearance: alert and cooperative GI: less dist, ostomy with output, incision mild old blood drainage  Lab Results: CBC  Recent Labs    05/17/22 0133 05/18/22 0106  WBC 25.1* 21.4*  HGB 8.9* 9.2*  HCT 29.1* 29.8*  PLT 260 262   BMET Recent Labs    05/17/22 0133 05/18/22 0106  NA 137 136  K 4.0 3.6  CL 110 109  CO2 17* 20*  GLUCOSE 100* 119*  BUN 41* 38*  CREATININE 1.60* 1.38*  CALCIUM 8.3* 8.1*   PT/INR No results for input(s): "LABPROT", "INR" in the last 72 hours. ABG No results for input(s): "PHART", "HCO3" in the last 72 hours.  Invalid input(s): "PCO2", "PO2"  Studies/Results: CT HUMERUS RIGHT WO CONTRAST  Result Date: 05/18/2022 CLINICAL DATA:  Fracture, humerus EXAM: CT OF THE RIGHT HUMERUS WITHOUT CONTRAST TECHNIQUE: Multidetector CT imaging was performed according to the standard protocol. Multiplanar CT image reconstructions were also generated. RADIATION DOSE REDUCTION: This exam was performed according to the departmental dose-optimization program which includes automated exposure control, adjustment of the mA and/or kV according to patient size and/or use of iterative reconstruction technique. COMPARISON:  Right shoulder radiograph 03/30/2022, CT 03/30/2022 FINDINGS:  Bones/Joint/Cartilage There is a displaced fracture through the surgical neck of the right proximal humerus with involvement of the lesser tuberosity. There is no greater tuberosity displacement. There is interval fracture margin resorption with some sclerotic margins developing. There is early periosteal reaction and developing callus without any bridging solid bone. Normal alignment of the humeral head with the glenoid. There is fracture foreshortening. No humeral head split. Mild glenohumeral osteoarthritis. Ligaments Suboptimally assessed by CT. Muscles and Tendons Generalized loss of muscle bulk. Soft tissues Soft tissue swelling along the shoulder. IMPRESSION: Displaced fracture of the right proximal humerus with interval resorption of fracture margins, early periosteal reaction and callus formation but no bony bridging. Electronically Signed   By: Caprice Renshaw M.D.   On: 05/18/2022 16:52    Anti-infectives: Anti-infectives (From admission, onward)    Start     Dose/Rate Route Frequency Ordered Stop   05/15/22 1900  ceFEPIme (MAXIPIME) 2 g in sodium chloride 0.9 % 100 mL IVPB  Status:  Discontinued        2 g 200 mL/hr over 30 Minutes Intravenous Every 24 hours 05/18/2022 1819 05/15/22 0109   05/15/22 0600  piperacillin-tazobactam (ZOSYN) IVPB 2.25 g        2.25 g 100 mL/hr over 30 Minutes Intravenous Every 8 hours 05/15/22 0302     05/21/2022 1630  ceFEPIme (MAXIPIME) 2 g in sodium chloride 0.9 % 100 mL IVPB        2 g 200 mL/hr over 30 Minutes Intravenous  Once 05/19/2022 1621 06/07/2022 1856  06/01/2022 1630  metroNIDAZOLE (FLAGYL) IVPB 500 mg        500 mg 100 mL/hr over 60 Minutes Intravenous  Once 06/07/2022 1621 06/01/2022 1856       Assessment/Plan: s/p Procedure(s): EXPLORATORY LAPAROTOMY, low anterior resection with end colostomy LOW ANTERIOR BOWEL RESECTION POD 3 s/p exploratory laparotomy, LAR, end colostomy for perforated sigmoid diverticulitis with phlegm by Dr. Cliffton Asters on 5/5 - Path  pending - ostomy working, fulls today - Cont abx, plan 5d post op - WOC RN consult for new ostomy. Plan ostomy clinic referral at d/c.  - IS/pulm toilet - PT/OT. Plan SNF  FEN: Back down to sips cld. Boost breeze. Cont IVF 75 mL/hr. BP improved.  VTE: SCD's, SQH q 8h ID: cefepime/flagyl 5/4. Zosyn 5/5 >> continue 5 days post-op. Afebrile. WBC 60.4 > 33.2 > 25.1 > 21.4 Foley: out, voiding seems incomplete or low - RN to bladder scan Dispo: SDU    UTI - Ucx from 5/4 w/ proteus mirabilis. Sens to zosyn Acute on chronic anemia - CBC in AM R humerus fx - 3/21. Per notes, Dr. Carola Frost planned non-op management with sling and NWB. Reached out to ortho to let her know she was here and get updated recs. RUE NWB and no resistance exercises to RUE (the shoulder specifically) but has passive and active assistive motion of the shoulder. no restrictions from her elbow, forearm, wrist or hand. Looks like they are getting CT today. Defer results and f/u of this to their team. OT Hepatosplenomegaly ?CKD - PCP f/u.  GERD Endometriosis Thrombocytosis (follows with Dr. Mosetta Putt) - Plt 262 Sacral decubitus ulcer/pressure injuries - WOCN consult. Medihoney.   LOS: 4 days    Chelsea Gelinas, MD, MPH, FACS Trauma & General Surgery Use [email or web address] to contact on call provider  05/19/2022

## 2022-05-20 ENCOUNTER — Inpatient Hospital Stay (HOSPITAL_COMMUNITY): Payer: Medicare Other

## 2022-05-20 DIAGNOSIS — R140 Abdominal distension (gaseous): Secondary | ICD-10-CM | POA: Diagnosis not present

## 2022-05-20 LAB — BASIC METABOLIC PANEL
Anion gap: 11 (ref 5–15)
BUN: 36 mg/dL — ABNORMAL HIGH (ref 8–23)
CO2: 14 mmol/L — ABNORMAL LOW (ref 22–32)
Calcium: 7.5 mg/dL — ABNORMAL LOW (ref 8.9–10.3)
Chloride: 108 mmol/L (ref 98–111)
Creatinine, Ser: 1.65 mg/dL — ABNORMAL HIGH (ref 0.44–1.00)
GFR, Estimated: 30 mL/min — ABNORMAL LOW (ref >60)
Glucose, Bld: 103 mg/dL — ABNORMAL HIGH (ref 70–99)
Potassium: 4.6 mmol/L (ref 3.5–5.1)
Sodium: 133 mmol/L — ABNORMAL LOW (ref 135–145)

## 2022-05-20 LAB — CBC
HCT: 36.5 % (ref 36.0–46.0)
Hemoglobin: 10.8 g/dL — ABNORMAL LOW (ref 12.0–15.0)
MCH: 28.5 pg (ref 26.0–34.0)
MCHC: 29.6 g/dL — ABNORMAL LOW (ref 30.0–36.0)
MCV: 96.3 fL (ref 80.0–100.0)
Platelets: 317 10*3/uL (ref 150–400)
RBC: 3.79 MIL/uL — ABNORMAL LOW (ref 3.87–5.11)
RDW: 16.6 % — ABNORMAL HIGH (ref 11.5–15.5)
WBC: 54.8 10*3/uL (ref 4.0–10.5)
nRBC: 0.1 % (ref 0.0–0.2)

## 2022-05-20 MED ORDER — BETHANECHOL CHLORIDE 10 MG PO TABS
10.0000 mg | ORAL_TABLET | Freq: Three times a day (TID) | ORAL | Status: DC
  Administered 2022-05-20 – 2022-05-25 (×15): 10 mg via ORAL
  Filled 2022-05-20 (×15): qty 1

## 2022-05-20 MED ORDER — FLUCONAZOLE 150 MG PO TABS
150.0000 mg | ORAL_TABLET | Freq: Once | ORAL | Status: AC
  Administered 2022-05-20: 150 mg via ORAL
  Filled 2022-05-20: qty 1

## 2022-05-20 MED ORDER — POLYETHYLENE GLYCOL 3350 17 G PO PACK: 17.0000 g | PACK | Freq: Every day | ORAL | Status: DC | PRN

## 2022-05-20 MED ORDER — ALBUMIN HUMAN 5 % IV SOLN
12.5000 g | Freq: Once | INTRAVENOUS | Status: AC
  Administered 2022-05-20: 12.5 g via INTRAVENOUS
  Filled 2022-05-20: qty 250

## 2022-05-20 MED ORDER — DOCUSATE SODIUM 100 MG PO CAPS
100.0000 mg | ORAL_CAPSULE | Freq: Two times a day (BID) | ORAL | Status: DC | PRN
  Administered 2022-05-20: 100 mg via ORAL
  Filled 2022-05-20: qty 1

## 2022-05-20 NOTE — Progress Notes (Signed)
Notified on-call physician of critical WBC 54.3.

## 2022-05-20 NOTE — Progress Notes (Addendum)
Patient ID: Chelsea Garcia, female   DOB: 03-16-1933, 89 y.o.   MRN: [id number]  6 Days Post-Op    Subjective: Only taking in sips of cld. Feels distended. No n/v. Having some lower abdominal pain today b/l. Having ostomy output. Has not voided per RN since yesterday except some incontinence on pad this am. I/O yesterday with 15ml output. No bladder scan done today. On 2L. Feels a little sob. Dry cough. Pulling 250 on IS. Worked with PT yesterday.    Objective: Vital signs in last 24 hours: Temp:  [97.3 F (36.3 C)-97.5 F (36.4 C)] 97.5 F (36.4 C) (05/10 0758) Pulse Rate:  [75-89] 83 (05/10 0758) Resp:  [16-20] 20 (05/10 0758) BP: (87-103)/(46-60) 99/55 (05/10 0758) SpO2:  [91 %-100 %] 96 % (05/10 0800) Weight:  [63 kg] 63 kg (05/10 0300) Last BM Date : 05/19/22  Intake/Output from previous day: 05/09 0701 - 05/10 0700 In: 1948.7 [I.V.:1798.7; IV Piggyback:150] Out: 140 [Urine:15; Stool:125] Intake/Output this shift: No intake/output data recorded.  RN reports pt has a vaginal yeast infection and would like me to examine this for confirmation. Patient agreeable for me to examine this - RN in room for chaperone.   PE: Gen:  Alert, NAD, pleasant Card:  Reg Pulm:  CTAB, no W/R/R, effort normal. On 2L. Pulling 250-500 on IS.  Abd: Mod distension, generalized ttp greatest in RLQ and LLQ. No rigidity or guarding Some BS. Stoma viable. Air and dark semi-solid stool in ostomy bag. Midline wound with small amount of SS like drainage R lateral to the 7th most inferior staple, no surrouding cellulitis, otherwise cdi  GU: Chaperone, RN, Tedra Coupe [place]) present. Exam c/w external vaginal yeast infection.  Ext:  No LE edema. MAE's. Equal grip strength b/l.  Psych: A&Ox3    Lab Results: CBC  Recent Labs    05/19/22 0804 05/20/22 0250  WBC 43.1* 54.8*  HGB 11.5* 10.8*  HCT 38.6 36.5  PLT 336 317    BMET Recent Labs    05/19/22 0804 05/20/22 0121  NA 137 133*  K 4.0 4.6  CL 108 108   CO2 20* 14*  GLUCOSE 106* 103*  BUN 33* 36*  CREATININE 1.51* 1.65*  CALCIUM 8.4* 7.5*    PT/INR No results for input(s): "LABPROT", "INR" in the last 72 hours. ABG No results for input(s): "PHART", "HCO3" in the last 72 hours.  Invalid input(s): "PCO2", "PO2"  Studies/Results: DG Abd Portable 1V  Result Date: 05/20/2022 CLINICAL DATA:  Abdominal distension. EXAM: PORTABLE ABDOMEN - 1 VIEW COMPARISON:  May 15, 2022. FINDINGS: No abnormal bowel dilatation is noted. Midline surgical staples are noted in the pelvis. No abnormal calcifications are noted. IMPRESSION: No abnormal bowel dilatation. Electronically Signed   By: Lupita Raider M.D.   On: 05/20/2022 08:29   CT HUMERUS RIGHT WO CONTRAST  Result Date: 05/18/2022 CLINICAL DATA:  Fracture, humerus EXAM: CT OF THE RIGHT HUMERUS WITHOUT CONTRAST TECHNIQUE: Multidetector CT imaging was performed according to the standard protocol. Multiplanar CT image reconstructions were also generated. RADIATION DOSE REDUCTION: This exam was performed according to the departmental dose-optimization program which includes automated exposure control, adjustment of the mA and/or kV according to patient size and/or use of iterative reconstruction technique. COMPARISON:  Right shoulder radiograph 03/30/2022, CT 03/30/2022 FINDINGS: Bones/Joint/Cartilage There is a displaced fracture through the surgical neck of the right proximal humerus with involvement of the lesser tuberosity. There is no greater tuberosity displacement. There is interval fracture margin resorption with  some sclerotic margins developing. There is early periosteal reaction and developing callus without any bridging solid bone. Normal alignment of the humeral head with the glenoid. There is fracture foreshortening. No humeral head split. Mild glenohumeral osteoarthritis. Ligaments Suboptimally assessed by CT. Muscles and Tendons Generalized loss of muscle bulk. Soft tissues Soft tissue swelling  along the shoulder. IMPRESSION: Displaced fracture of the right proximal humerus with interval resorption of fracture margins, early periosteal reaction and callus formation but no bony bridging. Electronically Signed   By: Caprice Renshaw M.D.   On: 05/18/2022 16:52    Anti-infectives: Anti-infectives (From admission, onward)    Start     Dose/Rate Route Frequency Ordered Stop   05/15/22 1900  ceFEPIme (MAXIPIME) 2 g in sodium chloride 0.9 % 100 mL IVPB  Status:  Discontinued        2 g 200 mL/hr over 30 Minutes Intravenous Every 24 hours 06/03/2022 1819 05/15/22 0109   05/15/22 0600  piperacillin-tazobactam (ZOSYN) IVPB 2.25 g        2.25 g 100 mL/hr over 30 Minutes Intravenous Every 8 hours 05/15/22 0302     05/11/2022 1630  ceFEPIme (MAXIPIME) 2 g in sodium chloride 0.9 % 100 mL IVPB        2 g 200 mL/hr over 30 Minutes Intravenous  Once 05/28/2022 1621 05/18/2022 1856   06/08/2022 1630  metroNIDAZOLE (FLAGYL) IVPB 500 mg        500 mg 100 mL/hr over 60 Minutes Intravenous  Once 06/04/2022 1621 05/13/2022 1856       Assessment/Plan: POD 5 s/p exploratory laparotomy, LAR, end colostomy for perforated sigmoid diverticulitis with phlegm by Dr. Cliffton Asters on 5/5 - Path neg for malignancy  - Having ostomy output but more distended and poor po intake. Xray reassuring with air in colon and no sig SB dilation. Back down to CLD and monitor.  - Cont abx, plan 7d post op, consider CT in the next 24 hours if not improving/worsening.   - WOC RN consult for new ostomy. Plan ostomy clinic referral at d/c.  - Monitor wound - IS/pulm toilet - PT/OT. Plan SNF  FEN: Back down to cld. Boost breeze. Cont IVF 75 mL/hr. Albumin this am for soft BP.  VTE: SCD's, SQH q 8h ID: cefepime/flagyl 5/4. Zosyn 5/5 >> continue 7 days post-op. Afebrile. WBC 21.4 > 43.1 > 54.8 Foley: Bladder scan >> I/O >> leave foley if > 300cc.  Dispo: SDU    UTI - Ucx from 5/4 w/ proteus mirabilis and E. Coli. Sens to zosyn Acute on chronic  anemia - overall stable at 10.8. Repeat CBC in AM.  Yeast inf - fluconazole x 1.  R humerus fx - 3/21. Per notes, Dr. Carola Frost planned non-op management with sling and NWB. Reached out to ortho to let her know she was here and get updated recs. RUE NWB and no resistance exercises to RUE (the shoulder specifically) but has passive and active assistive motion of the shoulder. no restrictions from her elbow, forearm, wrist or hand. CT done while here. Discussed with their team. Cont current care. OT Hepatosplenomegaly ?CKD - PCP f/u. Cr up at 1.65 today. ? If 2/2 retention. Will I/O as above. Cont IVF and give albumin today. Recheck in am GERD Endometriosis Thrombocytosis (follows with Dr. Mosetta Putt) - Plt 317 Sacral decubitus ulcer/pressure injuries - WOCN consult. Medihoney.    LOS: 5 days    Violeta Gelinas, MD, MPH, FACS Trauma & General Surgery Use [email or web address] to contact  on call provider  05/20/2022

## 2022-05-20 NOTE — Progress Notes (Addendum)
Physical Therapy Treatment Patient Details Name: Chelsea Garcia MRN: [id number] DOB: [birth year] Today's Date: 05/20/2022   History of Present Illness Pt is an 89 y.o. female admitted 05/27/2022 with abdominal pain; workup for suspected perforated hollow viscus-most likely sigmoid colon. S/p ex lap with low anterior resection, end colostomy on 5/4. Of note, recent admission for R proximal humerus fx conervative management (03/31/2022); pt states she did not go to SNF. Other PMH includes cellulitis, osteopenia, essential tremor, LBP, glaucoma.    PT Comments    Pt received in supine, agreeable to therapy session with encouragement, pt c/o pain and abdominal discomfort (RN brought Tylenol to room during session) and dyspnea with upright seated posture at EOB, and unable to tolerate sitting >5 mins at EOB or transfer OOB to chair due to shortness of breath, fatigue and c/o weakness. Pt needing up to totalA +2 for squat pivot at bedside toward [facility] and +2 maxA for bed mobility. SpO2 WFL on 2L O2 [place]. Pt continues to benefit from PT services to progress toward functional mobility goals, although poor progress so far this week due to c/o pain, dyspnea and fatigue. Recommend nursing staff continue efforts to encourage pt OOB if medically appropriate over weekend, using hoyer lift if needed due to pt significant weakness. Pt may benefit from palliative consult given slow progress toward goals and global deconditioning.  Recommendations for follow up therapy are one component of a multi-disciplinary discharge planning process, led by the attending physician.  Recommendations may be updated based on patient status, additional functional criteria and insurance authorization.  Follow Up Recommendations  Can patient physically be transported by private vehicle:  (not at current level)    Assistance Recommended at Discharge Frequent or constant Supervision/Assistance  Patient can return home with the following A lot of help  with bathing/dressing/bathroom;Assistance with cooking/housework;Assist for transportation;Help with stairs or ramp for entrance;Two people to help with walking and/or transfers   Equipment Recommendations  Other (comment) (defer to next venue)    Recommendations for Other Services OT consult     Precautions / Restrictions Precautions Precautions: Fall;Other (comment) Precaution Comments: Abdominal for comfort s/p colostomy; sacral wound; h/o R proximal humerus fx with conservative management (03/31/2022) Restrictions Weight Bearing Restrictions: Yes RUE Weight Bearing: Non weight bearing Other Position/Activity Restrictions: per ortho consult pt still NWB'ing and no resistance exercises to RUE (the shoulder specifically) but has passive and active assistive motion of the shoulder. no restrictions from her elbow, forearm, wrist or hand.     Mobility  Bed Mobility Overal bed mobility: Needs Assistance Bed Mobility: Sidelying to Sit, Rolling, Sit to Sidelying Rolling: Mod assist Sidelying to sit: Max assist, +2 for physical assistance, HOB elevated     Sit to sidelying: Max assist, +2 for physical assistance General bed mobility comments: pt needs dense multimodal cues for sequencing/safety for log roll to/from EOB<>supine and increased physical assist due to weakness and pt had difficulty with RUE NWB during transfer.    Transfers Overall transfer level: Needs assistance   Transfers: Sit to/from Stand Sit to Stand: +2 physical assistance, Total assist, From elevated surface           General transfer comment: Pt given +2 UE/hip support with gait belt and bed pads with B feet blocked and unable to reach full upright posture at EOB; pt given totalA +2 for squat pivot toward HOB on her R side prior to return to supine    Ambulation/Gait  General Gait Details: pt unable to stand fully upright with +2 assist this date   Stairs             Wheelchair  Mobility    Modified Rankin (Stroke Patients Only)       Balance Overall balance assessment: Needs assistance Sitting-balance support: Feet supported, Single extremity supported Sitting balance-Leahy Scale: Poor Sitting balance - Comments: R lateral lean while sitting which pt attributes to scoliosis Postural control: Right lateral lean Standing balance support: Single extremity supported, Bilateral upper extremity supported, Reliant on assistive device for balance Standing balance-Leahy Scale: Zero Standing balance comment: pt unable to maintain upright posture with +2 A via gait belt/LUE HHA and bed pads under her hips                            Cognition Arousal/Alertness: Awake/alert Behavior During Therapy: Flat affect Overall Cognitive Status: Within Functional Limits for tasks assessed                                 General Comments: Pt with poor insight into deficits and needs frequent reminders for RUE NWB status. Pt uncomfortable in each position supine/sitting and once back to supine but reported that sitting was severely painful so defer OOB to chair as she c/o dypsnea with full 90* upright posture. RN notified.        Exercises Other Exercises Other Exercises: supine BLE AROM: ankle pumps, SAQ x10 reps ea Other Exercises: IS x 5 reps (pt achieves ~200 mL) Other Exercises: static sitting with pursed lip breathing x10 reps    General Comments General comments (skin integrity, edema, etc.): pt with c/o dypsnea/abdominal discomfort and bottom pain increased with EOB sitting, improved with transfer back to supine in bed chair posture. RN notified of pt continued functional decline and pt unable to tolerate seated posture OOB this date. SCDs re-donned at end of session, TED hose remain intact; SpO2 remains WFL on 2L O2 [place]; blanket roll behind her neck/top of pillow to promote neutral cx posture and folded bil pillows under each arm for support, pt  reports this is comfortable for her and verbalized understanding Toes on bil feet appear dusky.     Pertinent Vitals/Pain Pain Assessment Pain Assessment: Faces Faces Pain Scale: Hurts even more Breathing: occasional labored breathing, short period of hyperventilation Negative Vocalization: repeated troubled calling out, loud moaning/groaning, crying Facial Expression: facial grimacing Body Language: tense, distressed pacing, fidgeting Consolability: distracted or reassured by voice/touch PAINAD Score: 7 Pain Location: bottom, abdomen with sitting EOB Pain Descriptors / Indicators: Discomfort, Sore, Grimacing, Guarding, Tightness, Other (Comment) (c/o difficulty breathing while sitting EOB) Pain Intervention(s): Limited activity within patient's tolerance, Monitored during session, Repositioned, Patient requesting pain meds-RN notified, Other (comment) (RN left pain meds (Tylenol) in cup at pt bedside at beginning of session and requested that she take them while pt sitting EOB (pt transferred to EOB while PTA assisting her for balance) and pt taking pills from cup and swallowing them during session.)     PT Goals (current goals can now be found in the care plan section) Acute Rehab PT Goals Patient Stated Goal: agreeable to post-acute rehab to regain strength PT Goal Formulation: With patient Time For Goal Achievement: 05/29/22 Progress towards PT goals: Progressing toward goals (very limited progress)    Frequency    Min 1X/week  PT Plan Current plan remains appropriate       AM-PAC PT "6 Clicks" Mobility   Outcome Measure  Help needed turning from your back to your side while in a flat bed without using bedrails?: A Lot Help needed moving from lying on your back to sitting on the side of a flat bed without using bedrails?: Total (without rails pt unable) Help needed moving to and from a bed to a chair (including a wheelchair)?: Total Help needed standing up from a  chair using your arms (e.g., wheelchair or bedside chair)?: Total Help needed to walk in hospital room?: Total Help needed climbing 3-5 steps with a railing? : Total 6 Click Score: 7    End of Session Equipment Utilized During Treatment: Gait belt;Oxygen Activity Tolerance: Patient limited by fatigue;Patient limited by pain;Treatment limited secondary to medical complications (Comment);Other (comment) (DOE 3/4) Patient left: with call bell/phone within reach;in bed;with bed alarm set;with SCD's reapplied;Other (comment) (bed chair posture to promote pulmonary clearance and for aspiration prevention) Nurse Communication: Mobility status;Need for lift equipment;Precautions;Weight bearing status (RUE NWB) PT Visit Diagnosis: Other abnormalities of gait and mobility (R26.89);Muscle weakness (generalized) (M62.81)     Time: 1340-1404 PT Time Calculation (min) (ACUTE ONLY): 24 min  Charges:  $Therapeutic Exercise: 8-22 mins $Therapeutic Activity: 8-22 mins                     Chelsea Garcia P., PTA Acute Rehabilitation Services Secure Chat Preferred 9a-5:30pm Office: [phone]    Chelsea Garcia [facility] 05/20/2022, 4:22 PM

## 2022-05-20 NOTE — Progress Notes (Signed)
Bladder scanned pt twice (am and pm). Notified PA amount of urine after bladder scanning each time. Pt urinated 3 times. Would hold up on inserting foley cath per PA. Will continue to monitor the pt.   Lawson Radar, RN

## 2022-05-20 NOTE — TOC Progression Note (Signed)
Transition of Care [facility]) - Progression Note    Patient Details  Name: Chelsea Garcia MRN: [id number] Date of Birth: 07-22-1933  [facility] of [facility] CM/SW Contact  Erin Sons, [place] Phone Number: 05/20/2022, 1:06 PM  Clinical Narrative:     Daughter chooses [facility]. TOC will continue to follow and assist with transition to SNF when pt is stable for DC.   Expected Discharge Plan: Skilled Nursing Facility Barriers to Discharge: English as a second language teacher, Continued Medical Work up  Expected Discharge Plan and Services In-house Referral: Clinical Social Work     Living arrangements for the past 2 months: Single Family Home                                       Social Determinants of Health (SDOH) Interventions SDOH Screenings   Food Insecurity: No Food Insecurity (05/25/2022)  Housing: Low Risk  (06/01/2022)  Transportation Needs: No Transportation Needs (06/06/2022)  Utilities: Not At Risk (05/13/2022)  Depression (PHQ2-9): Medium Risk (04/13/2020)  Tobacco Use: Medium Risk (05/16/2022)    Readmission Risk Interventions     No data to display

## 2022-05-21 ENCOUNTER — Inpatient Hospital Stay (HOSPITAL_COMMUNITY): Payer: Medicare Other

## 2022-05-21 DIAGNOSIS — K668 Other specified disorders of peritoneum: Secondary | ICD-10-CM | POA: Diagnosis not present

## 2022-05-21 DIAGNOSIS — Z9889 Other specified postprocedural states: Secondary | ICD-10-CM | POA: Diagnosis not present

## 2022-05-21 DIAGNOSIS — J90 Pleural effusion, not elsewhere classified: Secondary | ICD-10-CM | POA: Diagnosis not present

## 2022-05-21 DIAGNOSIS — N200 Calculus of kidney: Secondary | ICD-10-CM | POA: Diagnosis not present

## 2022-05-21 LAB — BASIC METABOLIC PANEL
Anion gap: 11 (ref 5–15)
BUN: 37 mg/dL — ABNORMAL HIGH (ref 8–23)
CO2: 18 mmol/L — ABNORMAL LOW (ref 22–32)
Calcium: 8.2 mg/dL — ABNORMAL LOW (ref 8.9–10.3)
Chloride: 105 mmol/L (ref 98–111)
Creatinine, Ser: 1.73 mg/dL — ABNORMAL HIGH (ref 0.44–1.00)
GFR, Estimated: 28 mL/min — ABNORMAL LOW (ref >60)
Glucose, Bld: 113 mg/dL — ABNORMAL HIGH (ref 70–99)
Potassium: 3.9 mmol/L (ref 3.5–5.1)
Sodium: 134 mmol/L — ABNORMAL LOW (ref 135–145)

## 2022-05-21 LAB — CBC
HCT: 34.2 % — ABNORMAL LOW (ref 36.0–46.0)
Hemoglobin: 10.3 g/dL — ABNORMAL LOW (ref 12.0–15.0)
MCH: 27.9 pg (ref 26.0–34.0)
MCHC: 30.1 g/dL (ref 30.0–36.0)
MCV: 92.7 fL (ref 80.0–100.0)
Platelets: 389 10*3/uL (ref 150–400)
RBC: 3.69 MIL/uL — ABNORMAL LOW (ref 3.87–5.11)
RDW: 16.8 % — ABNORMAL HIGH (ref 11.5–15.5)
WBC: 68.1 10*3/uL (ref 4.0–10.5)
nRBC: 0.1 % (ref 0.0–0.2)

## 2022-05-21 MED ORDER — SODIUM CHLORIDE 0.9 % IV SOLN: INTRAVENOUS | Status: DC | PRN

## 2022-05-21 MED ORDER — NOREPINEPHRINE 4 MG/250ML-% IV SOLN
2.0000 ug/min | INTRAVENOUS | Status: DC
  Filled 2022-05-21: qty 250

## 2022-05-21 MED ORDER — ORAL CARE MOUTH RINSE: 15.0000 mL | OROMUCOSAL | Status: DC | PRN

## 2022-05-21 MED ORDER — SODIUM CHLORIDE 0.9 % IV SOLN: 250.0000 mL | INTRAVENOUS | Status: DC

## 2022-05-21 MED ORDER — ORAL CARE MOUTH RINSE
15.0000 mL | OROMUCOSAL | Status: DC
  Administered 2022-05-21 – 2022-05-26 (×14): 15 mL via OROMUCOSAL

## 2022-05-21 MED ORDER — LACTATED RINGERS IV BOLUS
250.0000 mL | Freq: Once | INTRAVENOUS | Status: AC
  Administered 2022-05-21: 250 mL via INTRAVENOUS

## 2022-05-21 NOTE — Progress Notes (Signed)
Paged on-call d/t decreasing BP in the 80s, Map in the 50s. On flowsheet. Received message from Dr. Bedelia Person, patient will be transferred to ICU and started on vasopressors.

## 2022-05-21 NOTE — Progress Notes (Signed)
There is order for vasopressors and need a USPIV access. Assessed both arms and there was no suitable veins for USPIV access. Rt. Arm is + 3-4 edema. Informed patient's RN regarding this matter. If patient receive vasopressor, patient needs CVC. HS [employer]

## 2022-05-21 NOTE — Consult Note (Signed)
NAME:  Chelsea Garcia, MRN:  [id number], DOB:  11/26/1933, LOS: 6 ADMISSION DATE:  05/25/2022, CONSULTATION DATE:  05/21/2022 REFERRING MD:  Dr. Bedelia Person, CHIEF COMPLAINT:  Hypotension    History of Present Illness:  Chelsea Garcia is a 89yo with a past medical history significant for bronchiectasis, thrombocytosis chronic venous insufficiency, GERD  who presented to the emergency department 5/4 for complaints of abdominal pain with distention and constipation.  Workup on an admission including CT abdomen pelvis suspicious for perforated sigmoid colon, general surgery was consulted and decision in collaboration with family was to proceed with ex lap.  Ex lap positive for perforated sigmoid diverticulitis with phlegmon  Per chart review it appears Hospital course initially uncomplicated but patient eventually developed ileus resulting in extended hospital stay.  Late evening of 5/11 PCCM was consulted to assist in medical management in the setting of hypertension and questionable need for vasopressor support.  Pertinent  Medical History  bronchiectasis, thrombocytosis chronic venous insufficiency, GERD   Significant Hospital Events: Including procedures, antibiotic start and stop dates in addition to other pertinent events   5/4 presented for acute abdominal pain 5/5 underwent ex lap with and ostomy creation due to perforated sigmoid diverticulitis 5/7 urine culture positive for Proteus mirabillis 5/11 high output from ostomy with resultant hypotension, PCCM consulted for possible vasopressor need.  Interim History / Subjective:  Seen lying in bed with reports of feeling hungry   Objective   Blood pressure (!) 104/47, pulse 77, temperature 98 F (36.7 C), temperature source Oral, resp. rate 18, height 4\' 10"  (1.473 m), weight 63 kg, SpO2 100 %.        Intake/Output Summary (Last 24 hours) at 05/21/2022 1701 Last data filed at 05/21/2022 1200 Gross per 24 hour  Intake 420 ml  Output 5350 ml  Net  -4930 ml   Filed Weights   05/17/22 0600 05/18/22 0500 05/20/22 0300  Weight: 49.6 kg 49 kg 63 kg    Examination: General: Acute on chronic ill appearing adult female lying in bed in NAD HEENT: ETT, MM pink/moist, PERRL,  Neuro: Alert and oriented x3, non-focal  CV: s1s2 regular rate and rhythm, no murmur, rubs, or gallops,  PULM:  Clear to ascultation, no increased work of breathing, on RA GI: soft, bowel sounds active in all 4 quadrants, non-tender, non-distended, NPO  Extremities: warm/dry, left UE edema  Skin: no rashes or lesions  Resolved Hospital Problem list     Assessment & Plan:  Perforated sigmoid diverticulitis now s/p exploratory laparotomy with end colostomy -Pathology negative for malignancy P: Primary management per general surgery Currently n.p.o. Diet advancement per surgery  Hypotension likely secondary to intravascular depletion and hypoalbuminemia -Responded well to IV fluid bolus.  Bedside RN did report high ostomy output after which hypotension presented -For needed vasopressor support P: Continue IV hydration Continuous telemetry Can consider supplemental albumin if hypotension recurs  Urinary tract infection -Urine culture positive for Proteus mirabilis 5/4 with greater than 100,000 colonies P: Remains on empiric Zosyn  Acute on chronic anemia History of essential thrombocytosis -Follows in the outpatient setting with oncology P: Trend CBC Can consider oncology/hematology consult Continue empiric Zosyn as above Transfuse per protocol  Sacral decubitus pressure ulcer P: Local wound care Pressure living devices Optimize nutrition as able  Humeral fracture -Per chart evaluated by Dr. Carola Frost with plans of nonoperative management with sling and 9 weightbearing status P: Point-of-care  CCM will sign off, please call if further assistance is needed.  TRH  to assume medical consult starting 5/12  Best Practice (right click and "Reselect  all SmartList Selections" daily)  Per primary   Labs   CBC: Recent Labs  Lab 05/13/2022 1717 05/15/22 1050 05/17/22 0133 05/18/22 0106 05/19/22 0804 05/20/22 0250 05/21/22 0102  WBC 48.6*   < > 25.1* 21.4* 43.1* 54.8* 68.1*  NEUTROABS 46.2*  --   --   --   --   --   --   HGB 11.1*   < > 8.9* 9.2* 11.5* 10.8* 10.3*  HCT 35.9*   < > 29.1* 29.8* 38.6 36.5 34.2*  MCV 96.5   < > 95.7 94.0 95.5 96.3 92.7  PLT 254   < > 260 262 336 317 389   < > = values in this interval not displayed.    Basic Metabolic Panel: Recent Labs  Lab 05/17/22 0133 05/18/22 0106 05/19/22 0804 05/20/22 0121 05/21/22 0102  NA 137 136 137 133* 134*  K 4.0 3.6 4.0 4.6 3.9  CL 110 109 108 108 105  CO2 17* 20* 20* 14* 18*  GLUCOSE 100* 119* 106* 103* 113*  BUN 41* 38* 33* 36* 37*  CREATININE 1.60* 1.38* 1.51* 1.65* 1.73*  CALCIUM 8.3* 8.1* 8.4* 7.5* 8.2*   GFR: Estimated Creatinine Clearance: 18.3 mL/min (A) (by C-G formula based on SCr of 1.73 mg/dL (H)). Recent Labs  Lab 06/04/2022 1717 05/15/22 1050 05/18/22 0106 05/19/22 0804 05/20/22 0250 05/21/22 0102  WBC 48.6*   < > 21.4* 43.1* 54.8* 68.1*  LATICACIDVEN 2.0*  --   --   --   --   --    < > = values in this interval not displayed.    Liver Function Tests: Recent Labs  Lab 05/29/2022 1717  AST 14*  ALT 9  ALKPHOS 74  BILITOT 0.6  PROT 6.5  ALBUMIN 3.3*   No results for input(s): "LIPASE", "AMYLASE" in the last 168 hours. No results for input(s): "AMMONIA" in the last 168 hours.  ABG No results found for: "PHART", "PCO2ART", "PO2ART", "HCO3", "TCO2", "ACIDBASEDEF", "O2SAT"   Coagulation Profile: Recent Labs  Lab 06/08/2022 1717  INR 1.4*    Cardiac Enzymes: No results for input(s): "CKTOTAL", "CKMB", "CKMBINDEX", "TROPONINI" in the last 168 hours.  HbA1C: No results found for: "HGBA1C"  CBG: No results for input(s): "GLUCAP" in the last 168 hours.  Review of Systems:   Please see the history of present illness. All  other systems reviewed and are negative   Past Medical History:  She,  has a past medical history of Bronchiectasis (HCC), Cellulitis of right foot, Colon polyps, Complication of anesthesia, Diarrhea, Dry eye (11/25/2020), Endometriosis, Essential tremor, GERD (gastroesophageal reflux disease), Glaucoma, Low back pain, Migraine, Osteopenia, Seasonal allergies, Thrombocytosis (09/2013), Tick bites, UPJ (ureteropelvic junction) obstruction (11/25/2020), Urinary frequency, Wears dentures, and Wears glasses.   Surgical History:   Past Surgical History:  Procedure Laterality Date   BOWEL RESECTION  05/13/2022   Procedure: LOW ANTERIOR BOWEL RESECTION;  Surgeon: Andria Meuse, MD;  Location: [facility] OR;  Service: General;;   CATARACT EXTRACTION Left 02/07/2013   right 2015   colonscopy     10 or 15 yrs ago per pt on 11-25-2020   CYSTOSCOPY W/ URETERAL STENT PLACEMENT Left 11/26/2020   Procedure: CYSTOSCOPY WITH BILATERAL RETROGRADE PYELOGRAM/URETERAL STENT PLACEMENT;  Surgeon: Jerilee Field, MD;  Location: [facility];  Service: Urology;  Laterality: Left;  ONLY NEEDS 30 MIN   CYSTOSCOPY WITH URETEROSCOPY AND STENT PLACEMENT Left  01/12/2021   Procedure: CYSTOSCOPY WITH URETEROSCOPY AND STENT EXCHANGE;  Surgeon: Jerilee Field, MD;  Location: [facility];  Service: Urology;  Laterality: Left;   INGUINAL HERNIA REPAIR Left 01/09/2020   Procedure: LEFT INGUINAL HERNIA REPAIR WITH MESH;  Surgeon: Harriette Bouillon, MD;  Location: [facility];  Service: General;  Laterality: Left;   INSERTION OF MESH Left 01/09/2020   Procedure: INSERTION OF MESH;  Surgeon: Harriette Bouillon, MD;  Location: [facility];  Service: General;  Laterality: Left;   LAPAROTOMY N/A 06/05/2022   Procedure: EXPLORATORY LAPAROTOMY, low anterior resection with end colostomy;  Surgeon: Andria Meuse, MD;  Location: [facility];  Service: General;  Laterality: N/A;   TOTAL  ABDOMINAL HYSTERECTOMY  09/10/1968   TUBAL LIGATION     yrs ago     Social History:   reports that she quit smoking about 60 years ago. Her smoking use included cigarettes. She has a 8.00 pack-year smoking history. She has never used smokeless tobacco. She reports that she does not currently use alcohol. She reports that she does not use drugs.   Family History:  Her family history includes Cancer in her father; Cancer (age of onset: 73) in her maternal aunt; Thyroid disease in her mother.   Allergies Allergies  Allergen Reactions   Nystatin     Swelling of lips   Bee Pollen Other (See Comments) and Cough    Mill dust, pollen. Mill dust, pollen.   Pollen Extract Cough    Mill dust, pollen.   Albuterol     Increased HR, had to go to the ER   Doxycycline Nausea And Vomiting   Mangifera Indica Fruit Ext (Mango) [Mangifera Indica] Diarrhea   Mite (D. Farinae) Other (See Comments)    General allergy   Sulfamethoxazole Other (See Comments)    Irritation in only eye drops Other Other     Home Medications  Prior to Admission medications   Medication Sig Start Date End Date Taking? Authorizing Provider  acetaminophen (TYLENOL) 500 MG tablet Take 500 mg by mouth every 6 (six) hours as needed for moderate pain.   Yes [provider]  aspirin EC 81 MG tablet Take 81 mg by mouth daily. Swallow whole.   Yes [provider]  brimonidine (ALPHAGAN) 0.2 % ophthalmic solution Place 1 drop into both eyes 2 (two) times daily. 08/17/20  Yes [provider]  calcium carbonate (OS-CAL) 1250 (500 Ca) MG chewable tablet Chew 1 tablet by mouth daily.   Yes [provider]  Docusate Sodium (DSS) 100 MG CAPS Take 1 capsule by mouth daily.   Yes [provider]  ferrous sulfate 325 (65 FE) MG EC tablet Take 1 tablet (325 mg total) by mouth daily with breakfast. 04/04/22 04/04/23 Yes Jerald Kief, MD  fluticasone-salmeterol (ADVAIR DISKUS) 100-50 MCG/ACT AEPB  Inhale 1 puff into the lungs 2 (two) times daily. 02/15/22  Yes Luciano Cutter, MD  Latanoprostene Bunod (VYZULTA) 0.024 % SOLN Apply 1 drop to eye every evening. Both eyes   Yes [provider]  levothyroxine (SYNTHROID) 50 MCG tablet Take 50 mcg by mouth every morning.   Yes [provider]  Netarsudil Dimesylate (RHOPRESSA) 0.02 % SOLN Apply 1 drop to eye every evening. Both eyes   Yes [provider]  oxyCODONE (OXY IR/ROXICODONE) 5 MG immediate release tablet Take 1 tablet (5 mg total) by mouth every 6 (six) hours as needed for moderate pain or breakthrough  pain. 04/01/22  Yes Jerald Kief, MD  Polyethyl Glycol-Propyl Glycol (SYSTANE OP) Place 1 drop into both eyes daily as needed (dry eyes). Uses eye drops prn for dry eye   Yes [provider]  tamsulosin (FLOMAX) 0.4 MG CAPS capsule Take 0.4 mg by mouth at bedtime. 02/08/22  Yes [provider]  feeding supplement, GLUCERNA SHAKE, (GLUCERNA SHAKE) LIQD Take 237 mLs by mouth 2 (two) times daily between meals.    [provider]  polyethylene glycol (MIRALAX / GLYCOLAX) 17 g packet Take 17 g by mouth daily. Hold for diarrhea Patient not taking: Reported on 06/08/2022 04/04/22   Jerald Kief, MD     Critical care time:  NA  Lashauna Arpin D. Harris, NP-C [facility] Personal contact information can be found on Amion  If no contact or response made please call [phone] 05/21/2022, 6:46 PM

## 2022-05-21 NOTE — Progress Notes (Addendum)
Assessment & Plan: POD#6 - s/p ex lap, LAR, end colostomy for perforated sigmoid diverticulitis - Dr. Cliffton Asters on 05/15/2022 - Path neg for malignancy  - clear liquid diet - Cont abx, will get CT due to failure to progress, WBC increasing  - WOC RN consult for new ostomy. Plan ostomy clinic referral at d/c - Monitor wound - some drainage, no cellulitis, staples in place - will have dressing changes - IS/pulm toilet - PT/OT. Plan SNF  Complains of SOB this AM.  Will get CXR now portable.  Abdominal distension seems increased - will order CT scan as discussed yesterday.  May need NG tube replaced - discussed with patient and visitor at bedside.  May require transfer to stepdown and medical consultation.   FEN: CLD. Boost breeze. Cont IVF 75 mL/hr. Albumin this am for soft BP.  VTE: SCD's, SQH q 8h ID: cefepime/flagyl 5/4. Zosyn 5/5 >> continue 7 days post-op. Afebrile. WBC 21.4 > 43.1 > 54.8 Foley: Bladder scan >> I/O >> leave foley if > 300cc.  Dispo: SDU    UTI - Ucx from 5/4 w/ proteus mirabilis and E. Coli. Sens to zosyn Acute on chronic anemia - Hgb 10.3  Yeast inf - fluconazole x 1.  R humerus fx - 3/21. Per notes, Dr. Carola Frost planned non-op management with sling and NWB. Reached out to ortho to let her know she was here and get updated recs. RUE NWB and no resistance exercises to RUE (the shoulder specifically) but has passive and active assistive motion of the shoulder. no restrictions from her elbow, forearm, wrist or hand. CT done while here. Discussed with their team. Cont current care. OT Hepatosplenomegaly ?CKD - Cr 1.73 GERD Endometriosis Thrombocytosis (follows with Dr. Mosetta Putt) - Plt 389 Sacral decubitus ulcer/pressure injuries - WOCN consult.        Darnell Level, MD [facility] practice Office: [phone]        Chief Complaint: Colonic perforation  Subjective: Patient in bed, visitor at bedside.  Complains of SOB.  Objective: Vital signs  in last 24 hours: Temp:  [97.3 F (36.3 C)-98.2 F (36.8 C)] 98 F (36.7 C) (05/11 0823) Pulse Rate:  [77-92] 92 (05/11 0823) Resp:  [18-20] 18 (05/11 0823) BP: (98-132)/(53-66) 132/63 (05/11 0823) SpO2:  [93 %-97 %] 94 % (05/11 0826) Last BM Date : 05/20/22  Intake/Output from previous day: 05/10 0701 - 05/11 0700 In: 1799.5 [P.O.:540; I.V.:939; IV Piggyback:320.5] Out: 1050 [Urine:600; Stool:450] Intake/Output this shift: No intake/output data recorded.  Physical Exam: HEENT - sclerae clear, mucous membranes moist Neck - soft Abdomen - marked distension; mild tenderness; ostomy viable with small liquid output Neuro - alert & oriented, no focal deficits  Lab Results:  Recent Labs    05/20/22 0250 05/21/22 0102  WBC 54.8* 68.1*  HGB 10.8* 10.3*  HCT 36.5 34.2*  PLT 317 389   BMET Recent Labs    05/20/22 0121 05/21/22 0102  NA 133* 134*  K 4.6 3.9  CL 108 105  CO2 14* 18*  GLUCOSE 103* 113*  BUN 36* 37*  CREATININE 1.65* 1.73*  CALCIUM 7.5* 8.2*   PT/INR No results for input(s): "LABPROT", "INR" in the last 72 hours. Comprehensive Metabolic Panel:    Component Value Date/Time   NA 134 (L) 05/21/2022 0102   NA 133 (L) 05/20/2022 0121   NA 142 09/16/2016 0905   NA 142 03/10/2016 1236   K 3.9 05/21/2022 0102   K 4.6 05/20/2022  0121   K 4.0 09/16/2016 0905   K 4.3 03/10/2016 1236   CL 105 05/21/2022 0102   CL 108 05/20/2022 0121   CO2 18 (L) 05/21/2022 0102   CO2 14 (L) 05/20/2022 0121   CO2 23 09/16/2016 0905   CO2 28 03/10/2016 1236   BUN 37 (H) 05/21/2022 0102   BUN 36 (H) 05/20/2022 0121   BUN 18.3 09/16/2016 0905   BUN 21.1 03/10/2016 1236   CREATININE 1.73 (H) 05/21/2022 0102   CREATININE 1.65 (H) 05/20/2022 0121   CREATININE 1.40 (H) 02/21/2022 0916   CREATININE 0.98 02/25/2021 1528   CREATININE 0.8 09/16/2016 0905   CREATININE 0.8 03/10/2016 1236   GLUCOSE 113 (H) 05/21/2022 0102   GLUCOSE 103 (H) 05/20/2022 0121   GLUCOSE 107  09/16/2016 0905   GLUCOSE 87 03/10/2016 1236   CALCIUM 8.2 (L) 05/21/2022 0102   CALCIUM 7.5 (L) 05/20/2022 0121   CALCIUM 9.8 09/16/2016 0905   CALCIUM 10.0 03/10/2016 1236   AST 14 (L) 05/24/2022 1717   AST 16 04/03/2022 0212   AST 12 (L) 02/21/2022 0916   AST 15 02/25/2021 1528   AST 15 09/16/2016 0905   AST 14 03/10/2016 1236   ALT 9 06/08/2022 1717   ALT 15 04/03/2022 0212   ALT 10 02/21/2022 0916   ALT 12 02/25/2021 1528   ALT 9 09/16/2016 0905   ALT 11 03/10/2016 1236   ALKPHOS 74 05/22/2022 1717   ALKPHOS 56 04/03/2022 0212   ALKPHOS 49 09/16/2016 0905   ALKPHOS 58 03/10/2016 1236   BILITOT 0.6 05/17/2022 1717   BILITOT 0.8 04/03/2022 0212   BILITOT 0.5 02/21/2022 0916   BILITOT 0.6 02/25/2021 1528   BILITOT 0.79 09/16/2016 0905   BILITOT 0.58 03/10/2016 1236   PROT 6.5 06/05/2022 1717   PROT 5.2 (L) 04/03/2022 0212   PROT 7.1 09/16/2016 0905   PROT 7.1 03/10/2016 1236   ALBUMIN 3.3 (L) 05/25/2022 1717   ALBUMIN 2.8 (L) 04/03/2022 0212   ALBUMIN 4.2 09/16/2016 0905   ALBUMIN 4.3 03/10/2016 1236    Studies/Results: DG Abd Portable 1V  Result Date: 05/20/2022 CLINICAL DATA:  Abdominal distension. EXAM: PORTABLE ABDOMEN - 1 VIEW COMPARISON:  May 15, 2022. FINDINGS: No abnormal bowel dilatation is noted. Midline surgical staples are noted in the pelvis. No abnormal calcifications are noted. IMPRESSION: No abnormal bowel dilatation. Electronically Signed   By: Lupita Raider M.D.   On: 05/20/2022 08:29      Darnell Level 05/21/2022  Patient ID: Chelsea Garcia, female   DOB: 12-Jan-1933, 89 y.o.   MRN: [id number]

## 2022-05-21 NOTE — Progress Notes (Addendum)
Patient requested to get OOB for comfort. Patient sitting EOB and noted ostomy to be full and leaking orange colored fluid. Ostomy emptied and filled back up immediately. Total collected output . Some output lost d/t leaking into abdominal dressing, holding light pressure with towels to control leaking. Surgical dressing removed - patient's order says surgical dressing does not need to be replaced after POD#3. Ostomy dressing/pouch changed.   Notified [facility] MD on call. No new orders

## 2022-05-21 NOTE — Progress Notes (Signed)
Noted "ostomy output" and hypotension and CT findings. Patient seen and examined. Abdominal exam benign, patient reports she is much more comfortable. Suspect high volume ascites drainage from between colostomy sutures allowing for decompression. Responsive to fluid resuscitation. Continue to monitor.   Diamantina Monks, MD General and Trauma Surgery [facility]

## 2022-05-22 DIAGNOSIS — I959 Hypotension, unspecified: Secondary | ICD-10-CM | POA: Insufficient documentation

## 2022-05-22 DIAGNOSIS — S42302A Unspecified fracture of shaft of humerus, left arm, initial encounter for closed fracture: Secondary | ICD-10-CM | POA: Insufficient documentation

## 2022-05-22 DIAGNOSIS — R198 Other specified symptoms and signs involving the digestive system and abdomen: Secondary | ICD-10-CM

## 2022-05-22 DIAGNOSIS — D72825 Bandemia: Secondary | ICD-10-CM

## 2022-05-22 DIAGNOSIS — D72829 Elevated white blood cell count, unspecified: Secondary | ICD-10-CM | POA: Insufficient documentation

## 2022-05-22 DIAGNOSIS — Z9889 Other specified postprocedural states: Secondary | ICD-10-CM | POA: Diagnosis not present

## 2022-05-22 DIAGNOSIS — L8990 Pressure ulcer of unspecified site, unspecified stage: Secondary | ICD-10-CM | POA: Insufficient documentation

## 2022-05-22 DIAGNOSIS — D649 Anemia, unspecified: Secondary | ICD-10-CM

## 2022-05-22 DIAGNOSIS — L89153 Pressure ulcer of sacral region, stage 3: Secondary | ICD-10-CM | POA: Insufficient documentation

## 2022-05-22 DIAGNOSIS — N390 Urinary tract infection, site not specified: Secondary | ICD-10-CM | POA: Insufficient documentation

## 2022-05-22 DIAGNOSIS — D473 Essential (hemorrhagic) thrombocythemia: Secondary | ICD-10-CM

## 2022-05-22 DIAGNOSIS — S42301A Unspecified fracture of shaft of humerus, right arm, initial encounter for closed fracture: Secondary | ICD-10-CM | POA: Insufficient documentation

## 2022-05-22 LAB — HEPATIC FUNCTION PANEL
ALT: 11 U/L (ref 0–44)
AST: 11 U/L — ABNORMAL LOW (ref 15–41)
Albumin: 2.1 g/dL — ABNORMAL LOW (ref 3.5–5.0)
Alkaline Phosphatase: 69 U/L (ref 38–126)
Bilirubin, Direct: 0.1 mg/dL (ref 0.0–0.2)
Indirect Bilirubin: 0.4 mg/dL (ref 0.3–0.9)
Total Bilirubin: 0.5 mg/dL (ref 0.3–1.2)
Total Protein: 4.5 g/dL — ABNORMAL LOW (ref 6.5–8.1)

## 2022-05-22 LAB — CBC WITH DIFFERENTIAL/PLATELET
Abs Immature Granulocytes: 1.9 10*3/uL — ABNORMAL HIGH (ref 0.00–0.07)
Basophils Absolute: 0 10*3/uL (ref 0.0–0.1)
Basophils Relative: 0 %
Eosinophils Absolute: 0.6 10*3/uL — ABNORMAL HIGH (ref 0.0–0.5)
Eosinophils Relative: 1 %
HCT: 38.3 % (ref 36.0–46.0)
Hemoglobin: 11.3 g/dL — ABNORMAL LOW (ref 12.0–15.0)
Lymphocytes Relative: 0 %
Lymphs Abs: 0 10*3/uL — ABNORMAL LOW (ref 0.7–4.0)
MCH: 28.3 pg (ref 26.0–34.0)
MCHC: 29.5 g/dL — ABNORMAL LOW (ref 30.0–36.0)
MCV: 95.8 fL (ref 80.0–100.0)
Monocytes Absolute: 0.6 10*3/uL (ref 0.1–1.0)
Monocytes Relative: 1 %
Myelocytes: 3 %
Neutro Abs: 60.2 10*3/uL — ABNORMAL HIGH (ref 1.7–7.7)
Neutrophils Relative %: 95 %
Platelets: 306 10*3/uL (ref 150–400)
RBC: 4 MIL/uL (ref 3.87–5.11)
RDW: 17.1 % — ABNORMAL HIGH (ref 11.5–15.5)
WBC: 63.4 10*3/uL (ref 4.0–10.5)
nRBC: 0 % (ref 0.0–0.2)

## 2022-05-22 MED ORDER — PIPERACILLIN-TAZOBACTAM IN DEX 2-0.25 GM/50ML IV SOLN
2.2500 g | Freq: Three times a day (TID) | INTRAVENOUS | Status: DC
  Administered 2022-05-22 – 2022-05-23 (×3): 2.25 g via INTRAVENOUS
  Filled 2022-05-22 (×6): qty 50

## 2022-05-22 MED ORDER — ALBUMIN HUMAN 25 % IV SOLN
25.0000 g | Freq: Four times a day (QID) | INTRAVENOUS | Status: AC
  Administered 2022-05-22 – 2022-05-23 (×4): 25 g via INTRAVENOUS
  Filled 2022-05-22 (×4): qty 100

## 2022-05-22 NOTE — Progress Notes (Signed)
Assessment & Plan: POD#7 - s/p ex lap, LAR, end colostomy for perforated sigmoid diverticulitis - Dr. Cliffton Asters on 05/15/2022 - Path neg for malignancy  - resume clear liquid diet today - advance as tolerated, follow ostomy output - Cont abx - Zosyn - WOC RN consult for new ostomy - Monitor wound - some drainage, no cellulitis, staples in place - will have dressing changes - IS/pulm toilet - PT/OT. Plan SNF   CXR with mild pleural effusion, low volumes.  CT abd with ascites, hepatic cirrhosis, post op changes.  Appreciate CCM consultation and management.  Will ask TRH to assume role when CCM signs off.   FEN: CLD. Boost breeze VTE: SCD's, SQH q 8h ID: cefepime/flagyl 5/4. Zosyn 5/5 >> continue 7 days post-op. Afebrile. WBC 21.4 > 43.1 > 54.8 Foley: Bladder scan >> I/O >> leave foley if > 300cc.  Dispo: SDU    UTI - Ucx from 5/4 w/ proteus mirabilis and E. Coli. Sens to zosyn Acute on chronic anemia - Hgb 10.3  Yeast inf - fluconazole x 1.  R humerus fx - 3/21. Per notes, Dr. Carola Frost planned non-op management with sling and NWB. Reached out to ortho to let her know she was here and get updated recs. RUE NWB and no resistance exercises to RUE (the shoulder specifically) but has passive and active assistive motion of the shoulder. no restrictions from her elbow, forearm, wrist or hand. CT done while here. Discussed with their team. Cont current care. OT Hepatosplenomegaly - cirrhosis with ascites CKD - Cr 1.73 GERD Endometriosis Thrombocytosis (follows with Dr. Mosetta Putt) - Plt 389 Sacral decubitus ulcer/pressure injuries - WOCN consult.        Darnell Level, MD [facility] practice Office: [phone]        Chief Complaint: Perforated viscus  Subjective: Patient more comfortable this AM, denies SOB.  Wants to restart clear liquid diet.  Objective: Vital signs in last 24 hours: Temp:  [97.5 F (36.4 C)-98 F (36.7 C)] 97.5 F (36.4 C) (05/11 2300) Pulse  Rate:  [63-92] 63 (05/12 0300) Resp:  [16-24] 20 (05/12 0300) BP: (80-132)/(39-63) 97/39 (05/12 0300) SpO2:  [93 %-100 %] 97 % (05/12 0300) Weight:  [60 kg] 60 kg (05/12 0300) Last BM Date : 05/20/22  Intake/Output from previous day: 05/11 0701 - 05/12 0700 In: 2250.8 [P.O.:120; I.V.:1780.8; IV Piggyback:350] Out: 7150 [Urine:350; Stool:6800] Intake/Output this shift: No intake/output data recorded.  Physical Exam: HEENT - sclerae clear, mucous membranes moist Neck - soft Abdomen - much softer, less distended; ostomy with thin fluid output (likely ascites) Neuro - alert & oriented, no focal deficits  Lab Results:  Recent Labs    05/20/22 0250 05/21/22 0102  WBC 54.8* 68.1*  HGB 10.8* 10.3*  HCT 36.5 34.2*  PLT 317 389   BMET Recent Labs    05/20/22 0121 05/21/22 0102  NA 133* 134*  K 4.6 3.9  CL 108 105  CO2 14* 18*  GLUCOSE 103* 113*  BUN 36* 37*  CREATININE 1.65* 1.73*  CALCIUM 7.5* 8.2*   PT/INR No results for input(s): "LABPROT", "INR" in the last 72 hours. Comprehensive Metabolic Panel:    Component Value Date/Time   NA 134 (L) 05/21/2022 0102   NA 133 (L) 05/20/2022 0121   NA 142 09/16/2016 0905   NA 142 03/10/2016 1236   K 3.9 05/21/2022 0102   K 4.6 05/20/2022 0121   K 4.0 09/16/2016 0905   K 4.3 03/10/2016  1236   CL 105 05/21/2022 0102   CL 108 05/20/2022 0121   CO2 18 (L) 05/21/2022 0102   CO2 14 (L) 05/20/2022 0121   CO2 23 09/16/2016 0905   CO2 28 03/10/2016 1236   BUN 37 (H) 05/21/2022 0102   BUN 36 (H) 05/20/2022 0121   BUN 18.3 09/16/2016 0905   BUN 21.1 03/10/2016 1236   CREATININE 1.73 (H) 05/21/2022 0102   CREATININE 1.65 (H) 05/20/2022 0121   CREATININE 1.40 (H) 02/21/2022 0916   CREATININE 0.98 02/25/2021 1528   CREATININE 0.8 09/16/2016 0905   CREATININE 0.8 03/10/2016 1236   GLUCOSE 113 (H) 05/21/2022 0102   GLUCOSE 103 (H) 05/20/2022 0121   GLUCOSE 107 09/16/2016 0905   GLUCOSE 87 03/10/2016 1236   CALCIUM 8.2 (L)  05/21/2022 0102   CALCIUM 7.5 (L) 05/20/2022 0121   CALCIUM 9.8 09/16/2016 0905   CALCIUM 10.0 03/10/2016 1236   AST 11 (L) 05/22/2022 0419   AST 14 (L) 05/15/2022 1717   AST 12 (L) 02/21/2022 0916   AST 15 02/25/2021 1528   AST 15 09/16/2016 0905   AST 14 03/10/2016 1236   ALT 11 05/22/2022 0419   ALT 9 05/11/2022 1717   ALT 10 02/21/2022 0916   ALT 12 02/25/2021 1528   ALT 9 09/16/2016 0905   ALT 11 03/10/2016 1236   ALKPHOS 69 05/22/2022 0419   ALKPHOS 74 06/05/2022 1717   ALKPHOS 49 09/16/2016 0905   ALKPHOS 58 03/10/2016 1236   BILITOT 0.5 05/22/2022 0419   BILITOT 0.6 05/30/2022 1717   BILITOT 0.5 02/21/2022 0916   BILITOT 0.6 02/25/2021 1528   BILITOT 0.79 09/16/2016 0905   BILITOT 0.58 03/10/2016 1236   PROT 4.5 (L) 05/22/2022 0419   PROT 6.5 06/10/2022 1717   PROT 7.1 09/16/2016 0905   PROT 7.1 03/10/2016 1236   ALBUMIN 2.1 (L) 05/22/2022 0419   ALBUMIN 3.3 (L) 06/08/2022 1717   ALBUMIN 4.2 09/16/2016 0905   ALBUMIN 4.3 03/10/2016 1236    Studies/Results: CT ABDOMEN PELVIS WO CONTRAST  Result Date: 05/21/2022 CLINICAL DATA:  Postop day 6 from exploratory laparotomy with low anterior section and end colostomy for perforated sigmoid diverticulitis. EXAM: CT ABDOMEN AND PELVIS WITHOUT CONTRAST TECHNIQUE: Multidetector CT imaging of the abdomen and pelvis was performed following the standard protocol without IV contrast. RADIATION DOSE REDUCTION: This exam was performed according to the departmental dose-optimization program which includes automated exposure control, adjustment of the mA and/or kV according to patient size and/or use of iterative reconstruction technique. COMPARISON:  05/20/2022 FINDINGS: Lower chest: Bronchiectasis with areas of subsegmental volume loss again noted both lung bases, suggesting scarring. Small bilateral pleural effusions are mildly progressive in the interval. Hepatobiliary: No suspicious focal abnormality in the liver on this study without  intravenous contrast. Nodular hepatic contour suggests cirrhosis. Periportal edema evident. Markedly prominent portal vein, as before. Gallbladder is markedly distended without calcified stone disease evident. No substantial intrahepatic biliary duct dilatation. Extrahepatic common duct appears nondilated. Pancreas: No focal mass lesion. No dilatation of the main duct. No intraparenchymal cyst. No peripancreatic edema. Spleen: Spleen remains markedly enlarged. Adrenals/Urinary Tract: Left adrenal gland unremarkable. Right adrenal gland not well seen. Multiple large stones are again noted in both kidneys, as characterized on the study from 1 week ago. There is persistent similar mild right hydronephrosis. No left-sided hydronephrosis on today's study. There may be a tiny gas bubble in the anterior bladder lumen (axial 82/4) presumably secondary to recent instrumentation. Stomach/Bowel: Stomach  is nondilated. No overt small bowel dilatation although fluid-filled small bowel loops in the left pelvis measure up to 2.7 cm diameter. Terminal ileum unremarkable. Colon is nondilated with left abdominal sigmoid end colostomy evident. Hartmann's pouch anatomy associated. Vascular/Lymphatic: There is mild atherosclerotic calcification of the abdominal aorta without aneurysm. There is no gastrohepatic or hepatoduodenal ligament lymphadenopathy. No retroperitoneal or mesenteric lymphadenopathy. No pelvic sidewall lymphadenopathy. Reproductive: Hysterectomy.  There is no adnexal mass. Other: There is moderate volume ascites, mildly progressive in the interval with fluid in both para colic gutters and in the pelvis. There is dependent material of increased attenuation in the cul-de-sac fluid (see axial 77/4) which could be related to blood products or infectious debris. Extensive body wall, mesenteric, and retroperitoneal edema evident. Small to moderate volume pneumoperitoneum under the anterior abdominal wall is not entirely  unexpected on postoperative day 6. Musculoskeletal: No worrisome lytic or sclerotic osseous abnormality. IMPRESSION: 1. Small to moderate volume pneumoperitoneum under the anterior abdominal wall is not entirely unexpected on postoperative day 6. 2. Moderate volume ascites, mildly progressive in the interval with fluid in both para colic gutters and in the pelvis. There is dependent material of increased attenuation in the cul-de-sac fluid which could be related to blood products or infectious debris. 3. Extensive body wall, mesenteric, and retroperitoneal edema. 4. Small bilateral pleural effusions are mildly progressive in the interval. 5. Multiple large stones in both kidneys, as characterized on the study from 1 week ago. There is persistent similar mild right hydronephrosis. No left-sided hydronephrosis on today's study. 6. Hepatic cirrhosis with marked splenomegaly and dilated portal vein, as before and compatible with portal venous hypertension. 7. Markedly distended gallbladder without calcified stone disease evident. 8. Surgical changes consistent with low anterior resection. Left abdominal sigmoid end colostomy with Hartmann's pouch anatomy associated. 9.  Aortic Atherosclerosis (ICD10-I70.0). Electronically Signed   By: Kennith Center M.D.   On: 05/21/2022 13:01   DG CHEST PORT 1 VIEW  Result Date: 05/21/2022 CLINICAL DATA:  Shortness of breath. EXAM: PORTABLE CHEST 1 VIEW COMPARISON:  06/05/2022 FINDINGS: Low volume film. There is bibasilar collapse/consolidation with small bilateral pleural effusions. The cardio pericardial silhouette is enlarged. Degenerative changes again noted right shoulder with posttraumatic deformity in the right humeral neck. Telemetry leads overlie the chest. IMPRESSION: Low volume film with similar bibasilar right greater than left collapse/consolidation and small bilateral pleural effusions. Electronically Signed   By: Kennith Center M.D.   On: 05/21/2022 11:50      Darnell Level 05/22/2022  Patient ID: Chelsea Garcia, female   DOB: 03-18-1933, 89 y.o.   MRN: [id number]

## 2022-05-22 NOTE — Consult Note (Signed)
Initial Consultation Note   Patient: Chelsea Garcia BJY:[id number] DOB: 10-29-1933 PCP: Chelsea Bussing, MD DOA: 05/25/2022 DOS: the patient was seen and examined on 05/22/2022 Primary service: Chelsea Morita, Md, MD  Referring physician: Dr. Bedelia Garcia Reason for consult: hypotension/ UTI  Assessment/Plan: Assessment and Plan: Perforated sigmoid diverticulitis now status post exploratory laparotomy with end colostomy: Pathology was negative for malignancy. Surgery recommended to start a clear liquid diet, she is passing gas.  They recommended to continue IV Zosyn. Further management per surgery. Currently on IV Zosyn has remained afebrile last white count 68,000 on 05/20/2022. PT OT has been consulted anticipate she will need skilled. It is to note chest x-ray showed mild pleural effusion and CT scan with some ascites with hepatic cirrhosis.  Hypovolemic shock secondary to intravascular depletion plus or minus hypoalbuminemia: PCCM initially consulted patient responded well to IV fluids. Was given IV albumin, blood pressure is borderline the lowest is 97/46 to 121/48. Continue IV fluids albumin as needed. CT scan shows she has anasarca and small bilateral pleural effusion. Another option would be to start her on midodrine short-term, but will not start at this time as her blood pressure seems to be stable pain is controlled.  Possible UTI: Urine culture grew Proteus Mirabella's sensitive to Zosyn which should cover.  Acute on chronic normocytic anemia/essential thrombocytosis: Follow-up as an outpatient with oncology. Check a CBC to follow-up her hemoglobin her colostomy bag full of blood.  Leukocytosis: Follow-up with hematology oncology as an outpatient. Her baseline is usually around 20-30K, now 70,000 likely due to infectious etiology due to her bowel perforation. She has remained afebrile continue Zosyn per surgery. Question if she has underlying CLL will follow-up with oncology as an  outpatient.  Sacral decubitus ulcer stage II: Wound care has been consulted.  Humeral fracture: Per chart review Dr. Carola Garcia plans nonoperative management continue sling follow-up with Dr. Costella Garcia as an outpatient.   TRH will continue to follow the patient.  HPI: Chelsea Garcia is a 89 y.o. female with past medical history of past medical history for essential thrombocytosis JAK2 positive on Hydrea since 2015, anemia of chronic disease, history of bronchiectasis came into the ED on 05/27/2022 for abdominal pain CT scan of the abdomen pelvis was suspicious for perforated sigmoid: General surgery admitted the patient and discussed with the family and they proceeded with exploratory laparotomy positive for perforated sigmoid diverticulitis with phlegmon and review of hospital course it was complicated by developing ileus and developing transient hypotension for which PCCM was consulted on 05/21/2022, initially tried on for bolus for she responded fairly. Was continue on IV hydration General surgery consulted [place] for medical management.   Significant events: 5/4 presented for acute abdominal pain 5/5 underwent ex lap with and ostomy creation due to perforated sigmoid diverticulitis 5/7 urine culture positive for Proteus mirabillis 5/11 high output from ostomy with resultant hypotension, PCCM consulted for possible vasopressor need.  Review of Systems: As mentioned in the history of present illness. All other systems reviewed and are negative. Past Medical History:  Diagnosis Date   Bronchiectasis [facility])    followed by dr Chelsea Garcia completed pulmonary rehan 06-02-2020   Cellulitis of right foot    recurrent cellulitis last 7 days finishes ciprofloxacin for on 11-26-2020   Colon polyps    Benign. 2 colonoscopies in past. Due now again.   Complication of anesthesia    "got dizzy in 70's"   Diarrhea    resolved now taking imodium ad prn   Dry  eye 11/25/2020   both eyes   Endometriosis    required  TAH when young   Essential tremor    since 40's   GERD (gastroesophageal reflux disease)    denis   Glaucoma    started in 40's both eyes   Low back pain    since 40's   Migraine    started in 50's last migraine 10 or 20 yrs ago per pt on 11-25-2020   Osteopenia    2014   Seasonal allergies    to Dust, mold and pollen.   Thrombocytosis 09/2013   hx of resolved follows up with dr Chelsea Garcia oncology   Tick bites    she likes out door activities, 2 this year (2015)   UPJ (ureteropelvic junction) obstruction 11/25/2020   left   Urinary frequency    Wears dentures    upper   Wears glasses    Past Surgical History:  Procedure Laterality Date   BOWEL RESECTION  06/05/2022   Procedure: LOW ANTERIOR BOWEL RESECTION;  Surgeon: Chelsea Meuse, MD;  Location: [facility] OR;  Service: General;;   CATARACT EXTRACTION Left 02/07/2013   right 2015   colonscopy     10 or 15 yrs ago per pt on 11-25-2020   CYSTOSCOPY W/ URETERAL STENT PLACEMENT Left 11/26/2020   Procedure: CYSTOSCOPY WITH BILATERAL RETROGRADE PYELOGRAM/URETERAL STENT PLACEMENT;  Surgeon: Chelsea Field, MD;  Location: [facility];  Service: Urology;  Laterality: Left;  ONLY NEEDS 30 MIN   CYSTOSCOPY WITH URETEROSCOPY AND STENT PLACEMENT Left 01/12/2021   Procedure: CYSTOSCOPY WITH URETEROSCOPY AND STENT EXCHANGE;  Surgeon: Chelsea Field, MD;  Location: [facility];  Service: Urology;  Laterality: Left;   INGUINAL HERNIA REPAIR Left 01/09/2020   Procedure: LEFT INGUINAL HERNIA REPAIR WITH MESH;  Surgeon: Chelsea Bouillon, MD;  Location: [facility];  Service: General;  Laterality: Left;   INSERTION OF MESH Left 01/09/2020   Procedure: INSERTION OF MESH;  Surgeon: Chelsea Bouillon, MD;  Location: [facility];  Service: General;  Laterality: Left;   LAPAROTOMY N/A 05/21/2022   Procedure: EXPLORATORY LAPAROTOMY, low anterior resection with end colostomy;  Surgeon: Chelsea Meuse, MD;  Location: [facility] OR;  Service: General;  Laterality: N/A;   TOTAL ABDOMINAL HYSTERECTOMY  09/10/1968   TUBAL LIGATION     yrs ago   Social History:  reports that she quit smoking about 60 years ago. Her smoking use included cigarettes. She has a 8.00 pack-year smoking history. She has never used smokeless tobacco. She reports that she does not currently use alcohol. She reports that she does not use drugs.  Allergies  Allergen Reactions   Nystatin     Swelling of lips   Bee Pollen Other (See Comments) and Cough    Mill dust, pollen. Mill dust, pollen.   Pollen Extract Cough    Mill dust, pollen.   Albuterol     Increased HR, had to go to the ER   Doxycycline Nausea And Vomiting   Mangifera Indica Fruit Ext (Mango) [Mangifera Indica] Diarrhea   Mite (D. Farinae) Other (See Comments)    General allergy   Sulfamethoxazole Other (See Comments)    Irritation in only eye drops Other Other    Family History  Problem Relation Age of Onset   Thyroid disease Mother    Cancer Father    Cancer Maternal Aunt 27    Prior to Admission medications   Medication Sig Start  Date End Date Taking? Authorizing Provider  acetaminophen (TYLENOL) 500 MG tablet Take 500 mg by mouth every 6 (six) hours as needed for moderate pain.   Yes [provider]  aspirin EC 81 MG tablet Take 81 mg by mouth daily. Swallow whole.   Yes [provider]  brimonidine (ALPHAGAN) 0.2 % ophthalmic solution Place 1 drop into both eyes 2 (two) times daily. 08/17/20  Yes [provider]  calcium carbonate (OS-CAL) 1250 (500 Ca) MG chewable tablet Chew 1 tablet by mouth daily.   Yes [provider]  Docusate Sodium (DSS) 100 MG CAPS Take 1 capsule by mouth daily.   Yes [provider]  ferrous sulfate 325 (65 FE) MG EC tablet Take 1 tablet (325 mg total) by mouth daily with breakfast. 04/04/22 04/04/23 Yes Jerald Kief, MD  fluticasone-salmeterol (ADVAIR DISKUS)  100-50 MCG/ACT AEPB Inhale 1 puff into the lungs 2 (two) times daily. 02/15/22  Yes Luciano Cutter, MD  Latanoprostene Bunod (VYZULTA) 0.024 % SOLN Apply 1 drop to eye every evening. Both eyes   Yes [provider]  levothyroxine (SYNTHROID) 50 MCG tablet Take 50 mcg by mouth every morning.   Yes [provider]  Netarsudil Dimesylate (RHOPRESSA) 0.02 % SOLN Apply 1 drop to eye every evening. Both eyes   Yes [provider]  oxyCODONE (OXY IR/ROXICODONE) 5 MG immediate release tablet Take 1 tablet (5 mg total) by mouth every 6 (six) hours as needed for moderate pain or breakthrough pain. 04/01/22  Yes Jerald Kief, MD  Polyethyl Glycol-Propyl Glycol (SYSTANE OP) Place 1 drop into both eyes daily as needed (dry eyes). Uses eye drops prn for dry eye   Yes [provider]  tamsulosin (FLOMAX) 0.4 MG CAPS capsule Take 0.4 mg by mouth at bedtime. 02/08/22  Yes [provider]  feeding supplement, GLUCERNA SHAKE, (GLUCERNA SHAKE) LIQD Take 237 mLs by mouth 2 (two) times daily between meals.    [provider]  polyethylene glycol (MIRALAX / GLYCOLAX) 17 g packet Take 17 g by mouth daily. Hold for diarrhea Patient not taking: Reported on 05/31/2022 04/04/22   Jerald Kief, MD    Physical Exam: Vitals:   05/21/22 1645 05/21/22 1900 05/21/22 2300 05/22/22 0300  BP: (!) 104/47 (!) 121/48 (!) 99/41 (!) 97/39  Pulse: 77 81 70 63  Resp: 18 20 20 20   Temp:  (!) 97.5 F (36.4 C) (!) 97.5 F (36.4 C)   TempSrc:  Oral Oral   SpO2: 100% 94% 93% 97%  Weight:    60 kg  Height:       General exam: In no acute distress. Respiratory system: Good air movement and clear to auscultation. Cardiovascular system: S1 & S2 heard, RRR. No JVD. Gastrointestinal system: Positive bowel sounds, ostomy in place nontender to palpation.  Colostomy bag is full of blood Central nervous system: Alert and oriented. No focal neurological deficits. Extremities: No pedal  edema. Skin: No rashes, lesions or ulcers  Data Reviewed:  I have reviewed all her labs, her has a white count of 68,002 to 3 days ago usually ranges 20-30, she follows up with oncology as an outpatient.  Her colostomy bag is full of blood and check a CBC to follow-up on his hemoglobin  Family Communication: none Primary team communication: General surgery Thank you very much for involving [place] in the care of your patient.  Author: Marinda Elk, MD 05/22/2022 7:59 AM  For on call review  [email or web address]

## 2022-05-23 ENCOUNTER — Inpatient Hospital Stay (HOSPITAL_COMMUNITY): Payer: Medicare Other

## 2022-05-23 DIAGNOSIS — Z9049 Acquired absence of other specified parts of digestive tract: Secondary | ICD-10-CM | POA: Diagnosis not present

## 2022-05-23 DIAGNOSIS — Z9889 Other specified postprocedural states: Secondary | ICD-10-CM | POA: Diagnosis not present

## 2022-05-23 DIAGNOSIS — I959 Hypotension, unspecified: Secondary | ICD-10-CM

## 2022-05-23 DIAGNOSIS — R062 Wheezing: Secondary | ICD-10-CM | POA: Diagnosis not present

## 2022-05-23 DIAGNOSIS — J90 Pleural effusion, not elsewhere classified: Secondary | ICD-10-CM | POA: Diagnosis not present

## 2022-05-23 DIAGNOSIS — R0600 Dyspnea, unspecified: Secondary | ICD-10-CM | POA: Diagnosis not present

## 2022-05-23 DIAGNOSIS — D473 Essential (hemorrhagic) thrombocythemia: Secondary | ICD-10-CM | POA: Diagnosis not present

## 2022-05-23 LAB — CBC
HCT: 39.4 % (ref 36.0–46.0)
Hemoglobin: 11.6 g/dL — ABNORMAL LOW (ref 12.0–15.0)
MCH: 28.7 pg (ref 26.0–34.0)
MCHC: 29.4 g/dL — ABNORMAL LOW (ref 30.0–36.0)
MCV: 97.5 fL (ref 80.0–100.0)
Platelets: 294 10*3/uL (ref 150–400)
RBC: 4.04 MIL/uL (ref 3.87–5.11)
RDW: 17.3 % — ABNORMAL HIGH (ref 11.5–15.5)
WBC: 72.8 10*3/uL (ref 4.0–10.5)
nRBC: 0 % (ref 0.0–0.2)

## 2022-05-23 LAB — BASIC METABOLIC PANEL
Anion gap: 12 (ref 5–15)
BUN: 30 mg/dL — ABNORMAL HIGH (ref 8–23)
CO2: 15 mmol/L — ABNORMAL LOW (ref 22–32)
Calcium: 8.1 mg/dL — ABNORMAL LOW (ref 8.9–10.3)
Chloride: 107 mmol/L (ref 98–111)
Creatinine, Ser: 1.38 mg/dL — ABNORMAL HIGH (ref 0.44–1.00)
GFR, Estimated: 37 mL/min — ABNORMAL LOW (ref >60)
Glucose, Bld: 164 mg/dL — ABNORMAL HIGH (ref 70–99)
Potassium: 3.7 mmol/L (ref 3.5–5.1)
Sodium: 134 mmol/L — ABNORMAL LOW (ref 135–145)

## 2022-05-23 MED ORDER — LACTATED RINGERS IV BOLUS
500.0000 mL | Freq: Once | INTRAVENOUS | Status: AC
  Administered 2022-05-23: 500 mL via INTRAVENOUS

## 2022-05-23 MED ORDER — BOOST / RESOURCE BREEZE PO LIQD CUSTOM
1.0000 | Freq: Three times a day (TID) | ORAL | Status: DC
  Administered 2022-05-23 – 2022-05-24 (×4): 1 via ORAL

## 2022-05-23 NOTE — Hospital Course (Signed)
Chelsea Garcia is an 89 y.o. female with PMH essential thrombocytosis (JAK2 positive) on hydroxyurea since 2015, anemia chronic disease, bronchiectasis who presented to ER on 06/05/2022 with abdominal pain. CT abd/pelvis showed distal perforated diverticulitis.  General surgery evaluated and she was taken for ex-lap with LAR and end colostomy. She had complications of hypotension transiently and was managed by critical care as well.

## 2022-05-23 NOTE — Progress Notes (Signed)
Consult Progress Note    Chelsea Garcia   WUX:[id number]  DOB: 1933/10/21  DOA: 06/10/2022     8 PCP: Darrow Bussing, MD  Initial CC: abdominal pain  Hospital Course: Chelsea Garcia is an 89 y.o. female with PMH essential thrombocytosis (JAK2 positive) on hydroxyurea since 2015, anemia chronic disease, bronchiectasis who presented to ER on 06/03/2022 with abdominal pain. CT abd/pelvis showed distal perforated diverticulitis.  General surgery evaluated and she was taken for ex-lap with LAR and end colostomy. She had complications of hypotension transiently and was managed by critical care as well.   Interval History:  No events overnight. Family bedside. She has poor inspiratory effort but is trying the IS in bed. Encouraged her to continue getting OOB as able at least to chair and keep working with PT/OT.   Assessment and Plan:  Perforated sigmoid diverticulitis - s/p ex-lap with LAR and end colostomy on 05/15/22 - surgery continues to follow - continue CLD with advancement per surgery - continue zosyn as per surgery as well - ostomy teaching needed if not done   Hypovolemic shock  - secondary to intravascular depletion plus or minus hypoalbuminemia - PCCM initially consulted; patient responded well to IV fluids - s/p albumin - continue trending BP; may need further albumin and/or midodrine if BP drops again  Right proximal humeral fracture - initially diagnosed 03/30/22 - has been seen by ortho previously and discussed with again during this admission; non-op management - recommends to continue NWB to RUE, no resistance exercises to RUE (the shoulder specifically) but has passive and active assistive motion of the shoulder. No restrictions from her elbow, forearm, wrist or hand - CT repeated and stable: "Displaced fracture of the right proximal humerus with interval resorption of fracture margins, early periosteal reaction and callus formation but no bony bridging" - outpatient followup with  Dr. Carola Frost   Possible UTI: Urine culture grew Proteus Mirabella's sensitive to Zosyn which should cover.   Acute on chronic normocytic anemia Essential thrombocytosis Follow-up as an outpatient with oncology   Leukocytosis Follow-up with hematology oncology as an outpatient. Her baseline is usually around 20-30K, now 70,000 likely due to infectious etiology due to her bowel perforation. She has remained afebrile; continue Zosyn per surgery. Question if she has underlying CLL will follow-up with oncology as an outpatient.   Sacral decubitus ulcer stage II: Wound care has been consulted    Old records reviewed in assessment of this patient  Antimicrobials: Zosyn  DVT prophylaxis:  heparin injection 5,000 Units Start: 05/15/22 0600 SCD's Start: 05/15/22 0354 Place TED hose Start: 05/15/22 0110   Code Status:   Code Status: Full Code  Mobility Assessment (last 72 hours)     Mobility Assessment     Row Name 05/23/22 0548 05/22/22 2000 05/21/22 2000 05/21/22 0000 05/20/22 2000   Does patient have an order for bedrest or is patient medically unstable Yes- Bedfast (Level 1) - Complete Yes- Bedfast (Level 1) - Complete No - Continue assessment No - Continue assessment No - Continue assessment   What is the highest level of mobility based on the progressive mobility assessment? Level 1 (Bedfast) - Unable to balance while sitting on edge of bed Level 1 (Bedfast) - Unable to balance while sitting on edge of bed Level 2 (Chairfast) - Balance while sitting on edge of bed and cannot stand Level 2 (Chairfast) - Balance while sitting on edge of bed and cannot stand Level 2 (Chairfast) - Balance while sitting on edge of  bed and cannot stand   Is the above level different from baseline mobility prior to current illness? Yes - Recommend PT order Yes - Recommend PT order Yes - Recommend PT order -- --    Row Name 05/20/22 1600           What is the highest level of mobility based on the  progressive mobility assessment? Level 2 (Chairfast) - Balance while sitting on edge of bed and cannot stand                Barriers to discharge:  Disposition Plan:  SNF Status is: Inpt  Objective: Blood pressure (!) 97/54, pulse 76, temperature (!) 96.9 F (36.1 C), temperature source Axillary, resp. rate (!) 22, height 4\' 10"  (1.473 m), weight 59 kg, SpO2 94 %.  Examination:  Physical Exam Constitutional:      Comments: Frail elderly woman resting in bed in NAD  HENT:     Head: Normocephalic and atraumatic.     Mouth/Throat:     Mouth: Mucous membranes are moist.  Eyes:     Extraocular Movements: Extraocular movements intact.  Cardiovascular:     Rate and Rhythm: Normal rate and regular rhythm.  Pulmonary:     Comments: Poor inspiratory effort and poor air movement;  mild coarse sounds anterolaterally Abdominal:     General: There is distension.     Comments: Ostomy in place with air and dark liquid stool in bag  Musculoskeletal:     Cervical back: Normal range of motion.     Comments: RUE tender   Neurological:     Mental Status: She is oriented to person, place, and time.  Psychiatric:        Mood and Affect: Mood normal.      Consultants:    Procedures:  05/15/22: Ex-lap with LAR and end colostomy   Data Reviewed: No results found for this or any previous visit (from the past 24 hour(s)).  I have reviewed pertinent nursing notes, vitals, labs, and images as necessary. I have ordered labwork to follow up on as indicated.  I have reviewed the last notes from staff over past 24 hours. I have discussed patient's care plan and test results with nursing staff, CM/SW, and other staff as appropriate.  Time spent: Greater than 50% of the 55 minute visit was spent in counseling/coordination of care for the patient as laid out in the A&P.   LOS: 8 days   Lewie Chamber, MD [facility] 05/23/2022, 10:46 AM

## 2022-05-23 NOTE — Progress Notes (Signed)
BP soft 90/40, MAP 55-58, HR 65-70, Spo2 90 on room air. RR 20, 3 LPM of O2 NCL given. Abdominal distended and pain with hypoactive bowel sound. Oxycodone given q 6hrs. Concerning BP soft after giving Oxycodone.   Colostomy out put on day shift 1,150 ml mostly liquid with pink tint per RN day shift reported, on night shift colostomy output 230 ml with mostly greenish bile,  Dr. Lanae Boast notified. We received an order for LR 500 ml bolus. Pt has Albumin 25 g q 6 hr scheduled tonight. Hopefully Pt will respond to the fluid bolus and albumin. We will continue to monitor.   Filiberto Pinks, RN

## 2022-05-23 NOTE — Progress Notes (Signed)
Physical Therapy Treatment Patient Details Name: Chelsea Garcia MRN: [id number] DOB: 1934-01-10 Today's Date: 05/23/2022   History of Present Illness Pt is an 89 y.o. female admitted 06/03/2022 with abdominal pain; workup for suspected perforated hollow viscus-most likely sigmoid colon. S/p ex lap with low anterior resection, end colostomy on 5/4. Of note, recent admission for R proximal humerus fx conervative management (03/31/2022); pt states she did not go to SNF. Other PMH includes cellulitis, osteopenia, essential tremor, LBP, glaucoma.    PT Comments    Pt received in supine, pt lethargic but agreeable to therapy session and requesting to get OOB to chair. Pt needing increased assist up to +2 maxA for rolling for placement of hoyer lift and pt demos difficulty holding her neck up during bed mobility and while positioned upright in bed/chair. Pt with significant deconditioning even compared with previous PT session and c/o increased WOB with bed mobility, RR up to 27-28 rpm with rolling, decreases at rest. PTA discussed with RN, once pt lifted OOB to chair and positioned upright with proper cx support, pt SpO2 90-91% so O2 increased to 3L/min to maintain >92%. Pt attempting IS, pulling 100-255mL only. RN/MD notified pt states "I don't want to do all this anymore" once up in recliner and with clarification questions, pt appears to be requesting to discuss goals of care with family/MD.   Recommendations for follow up therapy are one component of a multi-disciplinary discharge planning process, led by the attending physician.  Recommendations may be updated based on patient status, additional functional criteria and insurance authorization.  Follow Up Recommendations  Can patient physically be transported by private vehicle:  (not at current level)    Assistance Recommended at Discharge Frequent or constant Supervision/Assistance  Patient can return home with the following A lot of help with  bathing/dressing/bathroom;Assistance with cooking/housework;Assist for transportation;Help with stairs or ramp for entrance;Two people to help with walking and/or transfers   Equipment Recommendations  Other (comment) (defer to next venue)    Recommendations for Other Services OT consult     Precautions / Restrictions Precautions Precautions: Fall;Other (comment) Precaution Comments: Abdominal for comfort s/p colostomy; sacral wound; h/o R proximal humerus fx with conservative management (03/31/2022) Restrictions Weight Bearing Restrictions: Yes RUE Weight Bearing: Non weight bearing Other Position/Activity Restrictions: Per ortho consult pt still NWB'ing and no resistance exercises to RUE (the shoulder specifically) but has passive and active assistive motion of the shoulder. No restrictions from her elbow, forearm, wrist or hand.     Mobility  Bed Mobility Overal bed mobility: Needs Assistance Bed Mobility: Rolling, Supine to Sit Rolling: Max assist, +2 for safety/equipment   Supine to sit: Total assist, +2 for physical assistance, HOB elevated     General bed mobility comments: Pt needs dense multimodal cues for sequencing/safety for roll to each side x2 reps ea for placement/removal of bed pads/hoyer lift pad. Pt encouraged to attempt supine to long sitting using only LUE to assist given RUE NWB but pt needs totalA and unable to maintain sitting upright unassisted. Of note, poor cervical control observed by pt while performing bed mobility and when pt repositioned mostly upright in chair, needing multiple pillows/blankets to promote neutral cx posture, pt unable to maintain upright head on her own today.    Transfers Overall transfer level: Needs assistance Equipment used: Ambulation equipment used Transfers: Bed to chair/wheelchair/BSC             General transfer comment: transfer via maxi-move given pt significant weakness this  date and pt c/o increased WOB with minimal  bed mobility so defer EOB/standing trials due to pt c/o pain, dyspnea (SpO2 WFL on 2L during bed mobility) and fatigue. Pt agreeable to hoyer OOB to chair. Transfer via Lift Equipment: Maximove  Ambulation/Gait               General Gait Details: Defer for pt safety given lethargy/pain and pt unable to tolerate upright/seated posture, decreased cervical control with repositioning.   Stairs             Wheelchair Mobility    Modified Rankin (Stroke Patients Only)       Balance Overall balance assessment: Needs assistance Sitting-balance support: Feet supported, Single extremity supported Sitting balance-Leahy Scale: Poor Sitting balance - Comments: R lateral lean while sitting which pt attributes to scoliosis Postural control: Right lateral lean     Standing balance comment: defer standing trials for pt safety today                            Cognition Arousal/Alertness: Lethargic Behavior During Therapy: Flat affect, Anxious Overall Cognitive Status: No family/caregiver present to determine baseline cognitive functioning                                 General Comments: Pt with poor insight into deficits and needs reminders for RUE NWB status. Pt states "I don't want to do this anymore" (after we lifted her OOB to chair with hoyer lift), and "What do I do?" and was tearful and asked to speak with family and MD about plan going forward, MD/RN notified. Pt states she is more comfortable in chair than she had been in air bed and was agreeable to try to remain up in chair an hour, but asking for pain meds.        Exercises Other Exercises Other Exercises: supine BLE A/AAROM: ankle pumps (AROM), SAQ, heel slides x5-10 reps ea Other Exercises: IS x 5 reps x2 sets (pt achieves ~200 mL) Other Exercises: supine RUE ROM: elbow flex/ext (AA), wrist flex/ext and gross grasp/finger ext (AROM) x5-10 reps ea    General Comments        Pertinent  Vitals/Pain Pain Assessment Pain Assessment: Faces Faces Pain Scale: Hurts even more Breathing: occasional labored breathing, short period of hyperventilation Negative Vocalization: repeated troubled calling out, loud moaning/groaning, crying Facial Expression: facial grimacing Body Language: tense, distressed pacing, fidgeting Consolability: distracted or reassured by voice/touch PAINAD Score: 7 Pain Location: generalized, pt does not localize this date Pain Descriptors / Indicators: Discomfort, Sore, Grimacing, Guarding, Tightness, Other (Comment) (c/o difficulty breathing while  supine to roll for placement of pads; therapist providing increased cx support PRN; pt states "I want medicine to sleep" and reoriented to time of day, pt states "I want oxycodone, I need to sleep", RN notified) Pain Intervention(s): Limited activity within patient's tolerance, Monitored during session, Repositioned, Patient requesting pain meds-RN notified    Home Living                          Prior Function            PT Goals (current goals can now be found in the care plan section) Acute Rehab PT Goals Patient Stated Goal: Pt states she wants to get OOB and to breathe better, per family they want her  to go to rehab to get stronger PT Goal Formulation: With patient Time For Goal Achievement: 05/29/22 Progress towards PT goals: Not progressing toward goals - comment    Frequency    Min 1X/week      PT Plan Current plan remains appropriate    Co-evaluation              AM-PAC PT "6 Clicks" Mobility   Outcome Measure  Help needed turning from your back to your side while in a flat bed without using bedrails?: A Lot Help needed moving from lying on your back to sitting on the side of a flat bed without using bedrails?: Total (without rails pt unable) Help needed moving to and from a bed to a chair (including a wheelchair)?: Total Help needed standing up from a chair using your  arms (e.g., wheelchair or bedside chair)?: Total Help needed to walk in hospital room?: Total Help needed climbing 3-5 steps with a railing? : Total 6 Click Score: 7    End of Session Equipment Utilized During Treatment: Oxygen Activity Tolerance: Patient limited by fatigue;Patient limited by pain;Treatment limited secondary to medical complications (Comment);Other (comment);Patient limited by lethargy (increased RR with rolling, c/o pain in any position she is in) Patient left: with call bell/phone within reach;Other (comment);in chair;with chair alarm set (geomat cushion under her hips for pressure relief, hoyer pad behind pillow behind her back for ease of return transfer from chair>bed) Nurse Communication: Mobility status;Need for lift equipment;Precautions;Weight bearing status;Patient requests pain meds (RUE NWB, increased WOB, pt seeming to be asking for palliative services and MD to speak with her/family) PT Visit Diagnosis: Other abnormalities of gait and mobility (R26.89);Muscle weakness (generalized) (M62.81)     Time: 1610-9604 PT Time Calculation (min) (ACUTE ONLY): 45 min  Charges:  $Therapeutic Exercise: 8-22 mins $Therapeutic Activity: 23-37 mins                     Rita Vialpando P., PTA Acute Rehabilitation Services Secure Chat Preferred 9a-5:30pm Office: [phone]    Dorathy Kinsman [facility] 05/23/2022, 1:30 PM

## 2022-05-23 NOTE — Consult Note (Signed)
WOC Nurse ostomy consult note Pt is in pain and crying, no family present.  She does not appear to understand about her ostomy. Current pouch is intact with good seal and was changed this weekend.  Stoma is red and viable, mod amt brown liquid stool.    5 sets of supplies ordered to the bedside for staff nurse use; barrier rings, [place] # [phone], wafer [place] # 2, pouches [place] # [phone]  Enrolled patient in [employer] DC program: Yes, previously. WOC team will attempt to perform a pouch change and teaching session on Tues or Wed when daughter is present.   Thank-you,  Cammie Mcgee MSN, RN, CWOCN, [place], CNS [phone]

## 2022-05-23 NOTE — Progress Notes (Addendum)
9 Days Post-Op  Subjective: CC: Some R lower abdominal pain this am. She thinks this is from where she is getting subq injections. Otherwise no abdominal pain. Only taking in a small amount of cld. Feels bloated but no n/v. Having ostomy output.   Last bp 93/45. She denies lightheadedness or dizziness this am. Up to the chair yesterday. RN increasing IVF from 56ml/hr to 166ml/hr. TRH following and considering midodrine. 1.38L from ostomy.   On 3L. Reports some sob. No cp. Using IS. Previous smoker.   Afebrile. No tachycardia.   Making urine (0.5 ml/kg/hr).   Objective: Vital signs in last 24 hours: Temp:  [96.5 F (35.8 C)-98.5 F (36.9 C)] 96.5 F (35.8 C) (05/13 0816) Pulse Rate:  [63-87] 67 (05/13 0754) Resp:  [17-29] 19 (05/13 0754) BP: (90-132)/(41-63) 93/45 (05/13 0816) SpO2:  [90 %-100 %] 93 % (05/13 0831) Weight:  [59 kg] 59 kg (05/13 0500) Last BM Date : 05/22/22  Intake/Output from previous day: 05/12 0701 - 05/13 0700 In: 2835.3 [P.O.:250; I.V.:1660.1; IV Piggyback:925.2] Out: 2105 [Urine:725; Stool:1380] Intake/Output this shift: No intake/output data recorded.  PE: Gen:  Alert, NAD, pleasant Card:  Reg Pulm:  Exp wheezing b/l. No rales or rhonchi. Normal rate and effort. On 2L. Abd: Mod distension but very soft. Mild ttp in RLQ, otherwise NT. No rigidity or guarding. + BS. Stoma budded and viable. Air and dark bilious like output in ostomy bag. Midline wound with dried drainage R lateral to the 7th most inferior staple - no active drainage or surrounding cellulitis - wound is otherwise cdi  Ext:  No LE edema. MAE's.  Psych: A&Ox3   Lab Results:  Recent Labs    05/21/22 0102 05/22/22 0827  WBC 68.1* 63.4*  HGB 10.3* 11.3*  HCT 34.2* 38.3  PLT 389 306   BMET Recent Labs    05/21/22 0102  NA 134*  K 3.9  CL 105  CO2 18*  GLUCOSE 113*  BUN 37*  CREATININE 1.73*  CALCIUM 8.2*   PT/INR No results for input(s): "LABPROT", "INR" in the last  72 hours. CMP     Component Value Date/Time   NA 134 (L) 05/21/2022 0102   NA 142 09/16/2016 0905   K 3.9 05/21/2022 0102   K 4.0 09/16/2016 0905   CL 105 05/21/2022 0102   CO2 18 (L) 05/21/2022 0102   CO2 23 09/16/2016 0905   GLUCOSE 113 (H) 05/21/2022 0102   GLUCOSE 107 09/16/2016 0905   BUN 37 (H) 05/21/2022 0102   BUN 18.3 09/16/2016 0905   CREATININE 1.73 (H) 05/21/2022 0102   CREATININE 1.40 (H) 02/21/2022 0916   CREATININE 0.8 09/16/2016 0905   CALCIUM 8.2 (L) 05/21/2022 0102   CALCIUM 9.8 09/16/2016 0905   PROT 4.5 (L) 05/22/2022 0419   PROT 7.1 09/16/2016 0905   ALBUMIN 2.1 (L) 05/22/2022 0419   ALBUMIN 4.2 09/16/2016 0905   AST 11 (L) 05/22/2022 0419   AST 12 (L) 02/21/2022 0916   AST 15 09/16/2016 0905   ALT 11 05/22/2022 0419   ALT 10 02/21/2022 0916   ALT 9 09/16/2016 0905   ALKPHOS 69 05/22/2022 0419   ALKPHOS 49 09/16/2016 0905   BILITOT 0.5 05/22/2022 0419   BILITOT 0.5 02/21/2022 0916   BILITOT 0.79 09/16/2016 0905   GFRNONAA 28 (L) 05/21/2022 0102   GFRNONAA 37 (L) 02/21/2022 0916   GFRAA >60 07/17/2019 1005   Lipase     Component Value Date/Time  LIPASE 39 04/08/2021 1600    Studies/Results: CT ABDOMEN PELVIS WO CONTRAST  Result Date: 05/21/2022 CLINICAL DATA:  Postop day 6 from exploratory laparotomy with low anterior section and end colostomy for perforated sigmoid diverticulitis. EXAM: CT ABDOMEN AND PELVIS WITHOUT CONTRAST TECHNIQUE: Multidetector CT imaging of the abdomen and pelvis was performed following the standard protocol without IV contrast. RADIATION DOSE REDUCTION: This exam was performed according to the departmental dose-optimization program which includes automated exposure control, adjustment of the mA and/or kV according to patient size and/or use of iterative reconstruction technique. COMPARISON:  05/29/2022 FINDINGS: Lower chest: Bronchiectasis with areas of subsegmental volume loss again noted both lung bases, suggesting  scarring. Small bilateral pleural effusions are mildly progressive in the interval. Hepatobiliary: No suspicious focal abnormality in the liver on this study without intravenous contrast. Nodular hepatic contour suggests cirrhosis. Periportal edema evident. Markedly prominent portal vein, as before. Gallbladder is markedly distended without calcified stone disease evident. No substantial intrahepatic biliary duct dilatation. Extrahepatic common duct appears nondilated. Pancreas: No focal mass lesion. No dilatation of the main duct. No intraparenchymal cyst. No peripancreatic edema. Spleen: Spleen remains markedly enlarged. Adrenals/Urinary Tract: Left adrenal gland unremarkable. Right adrenal gland not well seen. Multiple large stones are again noted in both kidneys, as characterized on the study from 1 week ago. There is persistent similar mild right hydronephrosis. No left-sided hydronephrosis on today's study. There may be a tiny gas bubble in the anterior bladder lumen (axial 82/4) presumably secondary to recent instrumentation. Stomach/Bowel: Stomach is nondilated. No overt small bowel dilatation although fluid-filled small bowel loops in the left pelvis measure up to 2.7 cm diameter. Terminal ileum unremarkable. Colon is nondilated with left abdominal sigmoid end colostomy evident. Hartmann's pouch anatomy associated. Vascular/Lymphatic: There is mild atherosclerotic calcification of the abdominal aorta without aneurysm. There is no gastrohepatic or hepatoduodenal ligament lymphadenopathy. No retroperitoneal or mesenteric lymphadenopathy. No pelvic sidewall lymphadenopathy. Reproductive: Hysterectomy.  There is no adnexal mass. Other: There is moderate volume ascites, mildly progressive in the interval with fluid in both para colic gutters and in the pelvis. There is dependent material of increased attenuation in the cul-de-sac fluid (see axial 77/4) which could be related to blood products or infectious  debris. Extensive body wall, mesenteric, and retroperitoneal edema evident. Small to moderate volume pneumoperitoneum under the anterior abdominal wall is not entirely unexpected on postoperative day 6. Musculoskeletal: No worrisome lytic or sclerotic osseous abnormality. IMPRESSION: 1. Small to moderate volume pneumoperitoneum under the anterior abdominal wall is not entirely unexpected on postoperative day 6. 2. Moderate volume ascites, mildly progressive in the interval with fluid in both para colic gutters and in the pelvis. There is dependent material of increased attenuation in the cul-de-sac fluid which could be related to blood products or infectious debris. 3. Extensive body wall, mesenteric, and retroperitoneal edema. 4. Small bilateral pleural effusions are mildly progressive in the interval. 5. Multiple large stones in both kidneys, as characterized on the study from 1 week ago. There is persistent similar mild right hydronephrosis. No left-sided hydronephrosis on today's study. 6. Hepatic cirrhosis with marked splenomegaly and dilated portal vein, as before and compatible with portal venous hypertension. 7. Markedly distended gallbladder without calcified stone disease evident. 8. Surgical changes consistent with low anterior resection. Left abdominal sigmoid end colostomy with Hartmann's pouch anatomy associated. 9.  Aortic Atherosclerosis (ICD10-I70.0). Electronically Signed   By: Kennith Center M.D.   On: 05/21/2022 13:01   DG CHEST PORT 1 VIEW  Result  Date: 05/21/2022 CLINICAL DATA:  Shortness of breath. EXAM: PORTABLE CHEST 1 VIEW COMPARISON:  05/18/2022 FINDINGS: Low volume film. There is bibasilar collapse/consolidation with small bilateral pleural effusions. The cardio pericardial silhouette is enlarged. Degenerative changes again noted right shoulder with posttraumatic deformity in the right humeral neck. Telemetry leads overlie the chest. IMPRESSION: Low volume film with similar bibasilar  right greater than left collapse/consolidation and small bilateral pleural effusions. Electronically Signed   By: Kennith Center M.D.   On: 05/21/2022 11:50    Anti-infectives: Anti-infectives (From admission, onward)    Start     Dose/Rate Route Frequency Ordered Stop   05/22/22 0900  piperacillin-tazobactam (ZOSYN) IVPB 2.25 g        2.25 g 100 mL/hr over 30 Minutes Intravenous Every 8 hours 05/22/22 0814     05/20/22 1015  fluconazole (DIFLUCAN) tablet 150 mg        150 mg Oral  Once 05/20/22 0915 05/20/22 1201   05/15/22 1900  ceFEPIme (MAXIPIME) 2 g in sodium chloride 0.9 % 100 mL IVPB  Status:  Discontinued        2 g 200 mL/hr over 30 Minutes Intravenous Every 24 hours 05/16/2022 1819 05/15/22 0109   05/15/22 0600  piperacillin-tazobactam (ZOSYN) IVPB 2.25 g        2.25 g 100 mL/hr over 30 Minutes Intravenous Every 8 hours 05/15/22 0302 05/21/22 2250   05/24/2022 1630  ceFEPIme (MAXIPIME) 2 g in sodium chloride 0.9 % 100 mL IVPB        2 g 200 mL/hr over 30 Minutes Intravenous  Once 06/01/2022 1621 05/30/2022 1856   06/10/2022 1630  metroNIDAZOLE (FLAGYL) IVPB 500 mg        500 mg 100 mL/hr over 60 Minutes Intravenous  Once 06/03/2022 1621 05/16/2022 1856        Assessment/Plan POD# 8 - s/p ex lap, LAR, end colostomy for perforated sigmoid diverticulitis - Dr. Cliffton Asters on 05/15/2022 - Path neg for malignancy  - CT 5/11 w/ ascites, hepatic cirrhosis, post op changes - no organized abscess.  - Keep on CLD today. Add boost breeze.  - Completed post op abx. Monitor wbc and fever curve. Labs pending this am.  - WOC RN consult for new ostomy - Monitor wound - some dried drainage, no cellulitis, staples in place  - Pulm toilet (IS, add FV). CXR. Looks like allergy to albuterol, will go back and ask her what she has tolerated in the past.  - PT/OT. Plan SNF    FEN: CLD. Boost breeze, IVF at 156ml/hr. TRH following to assist w/ BP.  VTE: SCD's, SQH q 8h ID: cefepime/flagyl 5/4. Zosyn 5/5 >>  completed 7 days post-op. Afebrile. WBC pending.  Foley: Spontaneous voids.  Dispo: SDU    UTI - Ucx from 5/4 w/ proteus mirabilis and E. Coli. Sens to zosyn. Completed course of abx . Acute on chronic anemia - Hgb stable at 11.3 Yeast inf - fluconazole x 1.  R humerus fx - 3/21. Per notes, Dr. Carola Frost planned non-op management with sling and NWB. Reached out to ortho to let her know she was here and get updated recs. RUE NWB and no resistance exercises to RUE (the shoulder specifically) but has passive and active assistive motion of the shoulder. no restrictions from her elbow, forearm, wrist or hand. CT done while here. Discussed with their team. Cont current care. OT Hepatosplenomegaly - cirrhosis with ascites CKD - Cr 1.73 GERD Endometriosis Thrombocytosis (follows with Dr. Mosetta Putt) -  Plt 306 Sacral decubitus ulcer/pressure injuries - WOCN consult.   LOS: 8 days    Jacinto Halim , [facility] 05/23/2022, 8:56 AM Please see Amion for pager number during day hours 7:00am-4:30pm

## 2022-05-24 ENCOUNTER — Inpatient Hospital Stay (HOSPITAL_COMMUNITY): Payer: Medicare Other

## 2022-05-24 DIAGNOSIS — K5720 Diverticulitis of large intestine with perforation and abscess without bleeding: Secondary | ICD-10-CM

## 2022-05-24 DIAGNOSIS — E43 Unspecified severe protein-calorie malnutrition: Secondary | ICD-10-CM | POA: Insufficient documentation

## 2022-05-24 DIAGNOSIS — R140 Abdominal distension (gaseous): Secondary | ICD-10-CM | POA: Diagnosis not present

## 2022-05-24 DIAGNOSIS — S42294D Other nondisplaced fracture of upper end of right humerus, subsequent encounter for fracture with routine healing: Secondary | ICD-10-CM

## 2022-05-24 DIAGNOSIS — Z9049 Acquired absence of other specified parts of digestive tract: Secondary | ICD-10-CM | POA: Diagnosis not present

## 2022-05-24 LAB — CBC
HCT: 36.8 % (ref 36.0–46.0)
Hemoglobin: 11.1 g/dL — ABNORMAL LOW (ref 12.0–15.0)
MCH: 28.5 pg (ref 26.0–34.0)
MCHC: 30.2 g/dL (ref 30.0–36.0)
MCV: 94.6 fL (ref 80.0–100.0)
Platelets: 245 10*3/uL (ref 150–400)
RBC: 3.89 MIL/uL (ref 3.87–5.11)
RDW: 17.3 % — ABNORMAL HIGH (ref 11.5–15.5)
WBC: 58.6 10*3/uL (ref 4.0–10.5)
nRBC: 0.1 % (ref 0.0–0.2)

## 2022-05-24 LAB — BASIC METABOLIC PANEL
Anion gap: 10 (ref 5–15)
BUN: 30 mg/dL — ABNORMAL HIGH (ref 8–23)
CO2: 18 mmol/L — ABNORMAL LOW (ref 22–32)
Calcium: 8.1 mg/dL — ABNORMAL LOW (ref 8.9–10.3)
Chloride: 106 mmol/L (ref 98–111)
Creatinine, Ser: 1.33 mg/dL — ABNORMAL HIGH (ref 0.44–1.00)
GFR, Estimated: 39 mL/min — ABNORMAL LOW (ref >60)
Glucose, Bld: 122 mg/dL — ABNORMAL HIGH (ref 70–99)
Potassium: 3.5 mmol/L (ref 3.5–5.1)
Sodium: 134 mmol/L — ABNORMAL LOW (ref 135–145)

## 2022-05-24 LAB — MAGNESIUM: Magnesium: 1.4 mg/dL — ABNORMAL LOW (ref 1.7–2.4)

## 2022-05-24 LAB — PHOSPHORUS: Phosphorus: 3.4 mg/dL (ref 2.5–4.6)

## 2022-05-24 MED ORDER — MIDODRINE HCL 5 MG PO TABS
5.0000 mg | ORAL_TABLET | Freq: Three times a day (TID) | ORAL | Status: DC
  Administered 2022-05-24 – 2022-05-25 (×3): 5 mg via ORAL
  Filled 2022-05-24 (×3): qty 1

## 2022-05-24 MED ORDER — JUVEN PO PACK
1.0000 | PACK | Freq: Two times a day (BID) | ORAL | Status: DC
  Administered 2022-05-24 – 2022-05-25 (×2): 1 via ORAL
  Filled 2022-05-24 (×2): qty 1

## 2022-05-24 MED ORDER — POTASSIUM CHLORIDE 10 MEQ/100ML IV SOLN
10.0000 meq | INTRAVENOUS | Status: AC
  Administered 2022-05-24 (×4): 10 meq via INTRAVENOUS
  Filled 2022-05-24 (×4): qty 100

## 2022-05-24 MED ORDER — PROSOURCE PLUS PO LIQD
30.0000 mL | Freq: Two times a day (BID) | ORAL | Status: DC
  Administered 2022-05-24 – 2022-05-25 (×2): 30 mL via ORAL
  Filled 2022-05-24 (×2): qty 30

## 2022-05-24 MED ORDER — ALBUMIN HUMAN 25 % IV SOLN
25.0000 g | Freq: Four times a day (QID) | INTRAVENOUS | Status: AC
  Administered 2022-05-24 – 2022-05-26 (×8): 25 g via INTRAVENOUS
  Filled 2022-05-24 (×8): qty 100

## 2022-05-24 MED ORDER — MAGNESIUM SULFATE 2 GM/50ML IV SOLN
2.0000 g | Freq: Once | INTRAVENOUS | Status: AC
  Administered 2022-05-24: 2 g via INTRAVENOUS
  Filled 2022-05-24: qty 50

## 2022-05-24 NOTE — Progress Notes (Signed)
MEW score is yellow zone due to Pt has RR 24-26, SPO2 90% on 3 LPM on NCL. She is anxious. She repeatedly calls and has complaints of shortness of breath and abdominal pain and distention. Dilaudid PRN given. Increasing O2 to 4 LPM, emotional support given. Pt is able to rest better after Dilaudid given. Colostomy has liquid dark greenish stool total 400 ml in 24 hrs. She is stable hemodynamically, BP 97/52 -124/98, HR 70s-80s, NSR on the monitor.   Filiberto Pinks, RN

## 2022-05-24 NOTE — Progress Notes (Signed)
Consult Progress Note    Chelsea Garcia   WUJ:[id number]  DOB: 07-Sep-1933  DOA: 05/13/2022     9 PCP: Darrow Bussing, MD  Initial CC: abdominal pain  Hospital Course: Chelsea Garcia is an 89 y.o. female with PMH essential thrombocytosis (JAK2 positive) on hydroxyurea since 2015, anemia chronic disease, bronchiectasis who presented to ER on 05/29/2022 with abdominal pain. CT abd/pelvis showed distal perforated diverticulitis.  General surgery evaluated and she was taken for ex-lap with LAR and end colostomy. She had complications of hypotension transiently and was managed by critical care as well.   Interval History:  More SOB this morning. Still not pulling in much on the spirometer and continues to have distended abdomen and low ostomy output; also not tolerating much CLD.    Assessment and Plan:  Perforated sigmoid diverticulitis Post op ileus - s/p ex-lap with LAR and end colostomy on 05/15/22 - surgery following - has been on CLD but not tolerating well.  Now with presumed ileus as well and low ostomy output -Zosyn discontinued - Likely approaching need for TPN given prolonged n.p.o. status with ileus; discussed with surgery - Also starting to show some signs of failure to improve with some decline.  Appropriate for palliative care to start to become involved   Hypovolemic shock  Hypotension - secondary to intravascular depletion plus or minus hypoalbuminemia - PCCM initially consulted; patient responded to IVF but starting to be at expense of respiratory status (more SOB today) - d/c IVF - resume albumin - trial of midodrine  Right proximal humeral fracture - initially diagnosed 03/30/22 - has been seen by ortho previously and discussed with again during this admission; non-op management - recommends to continue NWB to RUE, no resistance exercises to RUE (the shoulder specifically) but has passive and active assistive motion of the shoulder. No restrictions from her elbow, forearm,  wrist or hand - CT repeated and stable: "Displaced fracture of the right proximal humerus with interval resorption of fracture margins, early periosteal reaction and callus formation but no bony bridging" - outpatient followup with Dr. Carola Frost   Possible UTI Urine culture grew Proteus Mirabella's sensitive to Zosyn which should cover.   Acute on chronic normocytic anemia Essential thrombocytosis Follow-up as an outpatient with oncology   Leukocytosis Follow-up with hematology oncology as an outpatient. Her baseline is usually around 20-30K, now 70,000 likely due to infectious etiology due to her bowel perforation. She has remained afebrile; continue Zosyn per surgery. Question if she has underlying CLL will follow-up with oncology as an outpatient.   Sacral decubitus ulcer stage II: Wound care has been consulted    Old records reviewed in assessment of this patient  Antimicrobials: Zosyn  DVT prophylaxis:  heparin injection 5,000 Units Start: 05/15/22 0600 SCD's Start: 05/15/22 0354 Place TED hose Start: 05/15/22 0110   Code Status:   Code Status: Full Code  Mobility Assessment (last 72 hours)     Mobility Assessment     Row Name 05/24/22 1000 05/24/22 0307 05/23/22 2222 05/23/22 1300 05/23/22 0816   Does patient have an order for bedrest or is patient medically unstable No - Continue assessment Yes- Bedfast (Level 1) - Complete Yes- Bedfast (Level 1) - Complete -- No - Continue assessment   What is the highest level of mobility based on the progressive mobility assessment? Level 1 (Bedfast) - Unable to balance while sitting on edge of bed Level 1 (Bedfast) - Unable to balance while sitting on edge of bed Level 1 (Bedfast) -  Unable to balance while sitting on edge of bed Level 1 (Bedfast) - Unable to balance while sitting on edge of bed Level 1 (Bedfast) - Unable to balance while sitting on edge of bed   Is the above level different from baseline mobility prior to current  illness? Yes - Recommend PT order Yes - Recommend PT order Yes - Recommend PT order -- Yes - Recommend PT order    Row Name 05/23/22 0548 05/22/22 2000 05/21/22 2000       Does patient have an order for bedrest or is patient medically unstable Yes- Bedfast (Level 1) - Complete Yes- Bedfast (Level 1) - Complete No - Continue assessment     What is the highest level of mobility based on the progressive mobility assessment? Level 1 (Bedfast) - Unable to balance while sitting on edge of bed Level 1 (Bedfast) - Unable to balance while sitting on edge of bed Level 2 (Chairfast) - Balance while sitting on edge of bed and cannot stand     Is the above level different from baseline mobility prior to current illness? Yes - Recommend PT order Yes - Recommend PT order Yes - Recommend PT order              Barriers to discharge:  Disposition Plan:  SNF Status is: Inpt  Objective: Blood pressure (!) 95/57, pulse 81, temperature (!) 96.1 F (35.6 C), temperature source Axillary, resp. rate (!) 21, height 4\' 10"  (1.473 m), weight 61 kg, SpO2 99 %.  Examination:  Physical Exam Constitutional:      Comments: Frail elderly woman resting in bed in NAD  HENT:     Head: Normocephalic and atraumatic.     Mouth/Throat:     Mouth: Mucous membranes are moist.  Eyes:     Extraocular Movements: Extraocular movements intact.  Cardiovascular:     Rate and Rhythm: Normal rate and regular rhythm.  Pulmonary:     Comments: Poor inspiratory effort and poor air movement;  mild coarse sounds anterolaterally Abdominal:     General: Bowel sounds are decreased. There is distension.     Comments: Ostomy in place with air and dark liquid stool in bag  Musculoskeletal:     Cervical back: Normal range of motion.     Comments: RUE tender   Neurological:     Mental Status: She is oriented to person, place, and time.  Psychiatric:        Mood and Affect: Mood normal.      Consultants:  General  surgery Palliative care  Procedures:  05/15/22: Ex-lap with LAR and end colostomy   Data Reviewed: Results for orders placed or performed during the hospital encounter of 05/20/2022 (from the past 24 hour(s))  CBC     Status: Abnormal   Collection Time: 05/24/22  1:44 AM  Result Value Ref Range   WBC 58.6 (HH) 4.0 - 10.5 K/uL   RBC 3.89 3.87 - 5.11 MIL/uL   Hemoglobin 11.1 (L) 12.0 - 15.0 g/dL   HCT 16.1 09.6 - 04.5 %   MCV 94.6 80.0 - 100.0 fL   MCH 28.5 26.0 - 34.0 pg   MCHC 30.2 30.0 - 36.0 g/dL   RDW 40.9 (H) 81.1 - 91.4 %   Platelets 245 150 - 400 K/uL   nRBC 0.1 0.0 - 0.2 %  Basic metabolic panel     Status: Abnormal   Collection Time: 05/24/22  1:44 AM  Result Value Ref Range   Sodium  134 (L) 135 - 145 mmol/L   Potassium 3.5 3.5 - 5.1 mmol/L   Chloride 106 98 - 111 mmol/L   CO2 18 (L) 22 - 32 mmol/L   Glucose, Bld 122 (H) 70 - 99 mg/dL   BUN 30 (H) 8 - 23 mg/dL   Creatinine, Ser 0.98 (H) 0.44 - 1.00 mg/dL   Calcium 8.1 (L) 8.9 - 10.3 mg/dL   GFR, Estimated 39 (L) >60 mL/min   Anion gap 10 5 - 15  Magnesium     Status: Abnormal   Collection Time: 05/24/22  1:44 AM  Result Value Ref Range   Magnesium 1.4 (L) 1.7 - 2.4 mg/dL  Phosphorus     Status: None   Collection Time: 05/24/22  1:44 AM  Result Value Ref Range   Phosphorus 3.4 2.5 - 4.6 mg/dL    I have reviewed pertinent nursing notes, vitals, labs, and images as necessary. I have ordered labwork to follow up on as indicated.  I have reviewed the last notes from staff over past 24 hours. I have discussed patient's care plan and test results with nursing staff, CM/SW, and other staff as appropriate.  Time spent: Greater than 50% of the 55 minute visit was spent in counseling/coordination of care for the patient as laid out in the A&P.   LOS: 9 days   Lewie Chamber, MD [facility] 05/24/2022, 3:57 PM

## 2022-05-24 NOTE — Progress Notes (Signed)
10 Days Post-Op   Chief Complaint/Subjective: Worsening dyspnea overnight  Objective: Vital signs in last 24 hours: Temp:  [96.9 F (36.1 C)-98 F (36.7 C)] 98 F (36.7 C) (05/14 0449) Pulse Rate:  [76-85] 78 (05/14 0814) Resp:  [18-27] 20 (05/14 0548) BP: (97-124)/(52-98) 108/61 (05/14 0814) SpO2:  [90 %-96 %] 96 % (05/14 0743) Weight:  [61 kg] 61 kg (05/14 0400) Last BM Date : 05/23/22 Intake/Output from previous day: 05/13 0701 - 05/14 0700 In: 2544.3 [P.O.:345; I.V.:2199.3] Out: 780 [Urine:380; Stool:400]  PE: Gen: NAd Resp: labored Card: RRR Abd: soft, distended, ostomy pink and patent with thin bowel drainage  Lab Results:  Recent Labs    05/23/22 1045 05/24/22 0144  WBC 72.8* 58.6*  HGB 11.6* 11.1*  HCT 39.4 36.8  PLT 294 245   Recent Labs    05/23/22 1045 05/24/22 0144  NA 134* 134*  K 3.7 3.5  CL 107 106  CO2 15* 18*  GLUCOSE 164* 122*  BUN 30* 30*  CREATININE 1.38* 1.33*  CALCIUM 8.1* 8.1*   No results for input(s): "LABPROT", "INR" in the last 72 hours.    Component Value Date/Time   NA 134 (L) 05/24/2022 0144   NA 142 09/16/2016 0905   K 3.5 05/24/2022 0144   K 4.0 09/16/2016 0905   CL 106 05/24/2022 0144   CO2 18 (L) 05/24/2022 0144   CO2 23 09/16/2016 0905   GLUCOSE 122 (H) 05/24/2022 0144   GLUCOSE 107 09/16/2016 0905   BUN 30 (H) 05/24/2022 0144   BUN 18.3 09/16/2016 0905   CREATININE 1.33 (H) 05/24/2022 0144   CREATININE 1.40 (H) 02/21/2022 0916   CREATININE 0.8 09/16/2016 0905   CALCIUM 8.1 (L) 05/24/2022 0144   CALCIUM 9.8 09/16/2016 0905   PROT 4.5 (L) 05/22/2022 0419   PROT 7.1 09/16/2016 0905   ALBUMIN 2.1 (L) 05/22/2022 0419   ALBUMIN 4.2 09/16/2016 0905   AST 11 (L) 05/22/2022 0419   AST 12 (L) 02/21/2022 0916   AST 15 09/16/2016 0905   ALT 11 05/22/2022 0419   ALT 10 02/21/2022 0916   ALT 9 09/16/2016 0905   ALKPHOS 69 05/22/2022 0419   ALKPHOS 49 09/16/2016 0905   BILITOT 0.5 05/22/2022 0419   BILITOT 0.5  02/21/2022 0916   BILITOT 0.79 09/16/2016 0905   GFRNONAA 39 (L) 05/24/2022 0144   GFRNONAA 37 (L) 02/21/2022 0916   GFRAA >60 07/17/2019 1005    Assessment/Plan POD# 9 - s/p ex lap, LAR, end colostomy for perforated sigmoid diverticulitis - Dr. Cliffton Asters on 05/15/2022 - Path neg for malignancy  - CT 5/11 w/ ascites, hepatic cirrhosis, post op changes - no organized abscess.  - Keep on CLD today. Add boost breeze.  - Completed post op abx. Monitor wbc and fever curve. Labs pending this am.  - WOC RN consult for new ostomy - Monitor wound - some dried drainage, no cellulitis, staples in place  - Pulm toilet (IS, add FV). CXR. Looks like allergy to albuterol, will go back and ask her what she has tolerated in the past.  - PT/OT. Plan SNF -concern for worsening ileus, she was backed down to clears over the weekend and increased distension today.    FEN: CLD. Boost breeze, IVF at 143ml/hr. TRH following to assist w/ BP. XR abd today VTE: SCD's, SQH q 8h ID: cefepime/flagyl 5/4. Zosyn 5/5 >> completed 7 days post-op. Afebrile. WBC pending.  Foley: Spontaneous voids.  Dispo: SDU    UTI -  Ucx from 5/4 w/ proteus mirabilis and E. Coli. Sens to zosyn. Completed course of abx . Acute on chronic anemia - Hgb stable at 11.3 Yeast inf - fluconazole x 1.  R humerus fx - 3/21. Per notes, Dr. Carola Frost planned non-op management with sling and NWB. Reached out to ortho to let her know she was here and get updated recs. RUE NWB and no resistance exercises to RUE (the shoulder specifically) but has passive and active assistive motion of the shoulder. no restrictions from her elbow, forearm, wrist or hand. CT done while here. Discussed with their team. Cont current care. OT Hepatosplenomegaly - cirrhosis with ascites CKD - Cr 1.73 GERD Endometriosis Thrombocytosis (follows with Dr. Mosetta Putt) - Plt 306 Sacral decubitus ulcer/pressure injuries - WOCN consult.   LOS: 9 days   I reviewed last 24 h vitals and pain  scores, last 48 h intake and output, last 24 h labs and trends, and last 24 h imaging results.  This care required high  level of medical decision making.   De Blanch [facility] at [facility] 05/24/2022, 10:13 AM Please see Amion for pager number during day hours 7:00am-4:30pm or 7:00am -11:30am on weekends

## 2022-05-24 NOTE — Progress Notes (Signed)
Initial Nutrition Assessment  DOCUMENTATION CODES:   Severe malnutrition in context of acute illness/injury  INTERVENTION:  -Prosource BID which provides 60kcal, 15 g protein per packet  -1 packet Juven BID, each packet provides 95 calories, 2.5 grams of protein (collagen), and 9.8 grams of carbohydrate (3 grams sugar); also contains 7 grams of L-arginine and L-glutamine, 300 mg vitamin C, 15 mg vitamin E, 1.2 mcg vitamin B-12, 9.5 mg zinc, 200 mg calcium, and 1.5 g  Calcium Beta-hydroxy-Beta-methylbutyrate to support wound healing  Educated about adequate nutrition  Communicate with MD about potential need for TPN  Continue Boost Breeze po TID, each supplement provides 250 kcal and 9 grams of protein  D/c ensure surgery (patient continually refusing)   NUTRITION DIAGNOSIS:   Severe Malnutrition related to acute illness as evidenced by severe muscle depletion, moderate muscle depletion, severe fat depletion, energy intake < or equal to 50% for > or equal to 5 days.   GOAL:   Patient will meet greater than or equal to 90% of their needs -ongoing - not being met due to only tolerating liquid diet thus far   MONITOR:   PO intake, Supplement acceptance, Diet advancement, Labs, Weight trends, Skin, I & O's  REASON FOR ASSESSMENT:   Malnutrition Screening Tool    ASSESSMENT:   89 y.o. female with PMHx including GERD, endometriosis, thrombocytosis presents with 1-2 days of abdominal pain 2/2 perforated sigmoid colon.   Labs: Na 134, Glu 122, BUN 30, Cr 1.33, mag 1.4  Meds: calcium carbonate, ferrous sulfate, magnesium sulfate I/O's: +1.1 L  Admit wt: 110#, CBW- 134#  Weight jumped from 108# to 130#, wt change is unlikely, patient has most likely lost weight   Visited patient at bedside who was asleep. RD did not awaken. Her daughter in law/medical POA was present who reports, that patient is a good eater at baseline. She consumes 3 meals per day. Patient is reported to have a  hx of scoliosis, so her anatomy has changed and patient easily feels pressure in her stomach if she eats too much. Her DIL reports she belches between every 2 bites of food she takes.   Patient is s/p ex lap, LAR, end colostomy for perforated sigmoid diverticulitis. She has had an ileus which resolved and now may have another developing. Patient has not had been able to advance past a full liquid diet since admission 10 days ago. Her DIL reports she drinks some broth and boost throughout the day with no N/V, however the abdominal pressure persists. Patient's abdomen was hard to the touch during physical examination.   UBW reported to be ~110-112. Patient had recent fall and humerus fracture. She had decreased po intake and 2-4# wt loss as a result.   Patient's DIL was not aware of patient having heptic cirrhosis with ascites which was seen on 5/11's CT scan per  Dr. Guerry Minors note. RD encouraged her to follow up with MD on that.   RD discussed patient's need for additional ONS such as prosource and Juven. Patient's DIL reports she will make sure that patient drinks them everyday.   Patient may be a good candidate for TPN if diet does not advance in the next day or two, MD and surgeon are in discussion about this.   NUTRITION - FOCUSED PHYSICAL EXAM:  Flowsheet Row Most Recent Value  Orbital Region Severe depletion  Upper Arm Region Severe depletion  Thoracic and Lumbar Region Unable to assess  Buccal Region Severe depletion  [place]  Region Severe depletion  Clavicle Bone Region Severe depletion  Clavicle and Acromion Bone Region Severe depletion  Scapular Bone Region Unable to assess  Dorsal Hand Unable to assess  Patellar Region Severe depletion  Anterior Thigh Region Moderate depletion  Posterior Calf Region Moderate depletion  Edema (RD Assessment) None  Hair Reviewed  Eyes Unable to assess  Mouth Unable to assess  Skin Reviewed  Nails Unable to assess       Diet Order:    Diet Order             Diet clear liquid Room service appropriate? Yes; Fluid consistency: Thin  Diet effective now                   EDUCATION NEEDS:   Education needs have been addressed  Skin:  Skin Assessment: Skin Integrity Issues: Skin Integrity Issues:: Stage I, Stage III, Incisions Stage I: R heel Stage III: sacrum Incisions: abdomen and R elbow?  Last BM:  5/14 via ostomy 50ml  Height:   Ht Readings from Last 1 Encounters:  06/01/2022 4\' 10"  (1.473 m)    Weight:   Wt Readings from Last 1 Encounters:  05/24/22 61 kg     BMI:  Body mass index is 28.11 kg/m.  Estimated Nutritional Needs:   Kcal:  1300-1500  Protein:  60-75 g  Fluid:  > 1.5L    Leodis Rains, RDN, LDN  Clinical Nutrition

## 2022-05-24 NOTE — Progress Notes (Signed)
Occupational Therapy Treatment Patient Details Name: Chelsea Garcia MRN: [id number] DOB: 07-31-1933 Today's Date: 05/24/2022   History of present illness Pt is an 89 y.o. female admitted 05/17/2022 with abdominal pain; workup for suspected perforated hollow viscus-most likely sigmoid colon. S/p ex lap with low anterior resection, end colostomy on 5/4. Of note, recent admission for R proximal humerus fx conervative management (03/31/2022); pt states she did not go to SNF. Other PMH includes cellulitis, osteopenia, essential tremor, LBP, glaucoma.   OT comments  Pt continues to be limited by impaired cog, decreased deficits, and RUE limitations. Pt currently Mod A for bed mobility and Total A for OOB mobility. Pt EOB balance poor and uses LUE to offer support, HR in 80s with functional activity. Pt slowly processing and slow to initiate functional mobility, needing verbal and tactile cues for sequencing. OT to continue to progress pt as able. DC plans remain appropriate for SNF.   Recommendations for follow up therapy are one component of a multi-disciplinary discharge planning process, led by the attending physician.  Recommendations may be updated based on patient status, additional functional criteria and insurance authorization.    Assistance Recommended at Discharge Frequent or constant Supervision/Assistance  Patient can return home with the following  A lot of help with walking and/or transfers;A lot of help with bathing/dressing/bathroom;Assistance with cooking/housework;Help with stairs or ramp for entrance;Assist for transportation   Equipment Recommendations  Other (comment) (defer to next level of care)    Recommendations for Other Services      Precautions / Restrictions Precautions Precautions: Fall;Other (comment) Precaution Comments: Abdominal for comfort s/p colostomy; sacral wound; h/o R proximal humerus fx with conservative management (03/31/2022) Restrictions Weight Bearing  Restrictions: Yes RUE Weight Bearing: Non weight bearing Other Position/Activity Restrictions: Per ortho consult pt still NWB'ing and no resistance exercises to RUE (the shoulder specifically) but has passive and active assistive motion of the shoulder. No restrictions from her elbow, forearm, wrist or hand.       Mobility Bed Mobility Overal bed mobility: Needs Assistance Bed Mobility: Rolling, Sit to Supine Rolling: Mod assist     Sit to supine: Total assist        Transfers Overall transfer level: Needs assistance Equipment used: Ambulation equipment used Hunt[place] Dec stedy) Transfers: Sit to/from Stand Sit to Stand: Total assist           General transfer comment: Pt standing with Heavy total A using LUE and Corene Cornea.     Balance Overall balance assessment: Needs assistance Sitting-balance support: Feet supported, Single extremity supported Sitting balance-Leahy Scale: Poor Sitting balance - Comments: Pt needing LUE supported on rails to maintain posture Postural control: Right lateral lean Standing balance support: Bilateral upper extremity supported, Reliant on assistive device for balance Standing balance-Leahy Scale: Zero                             ADL either performed or assessed with clinical judgement   ADL                                       Functional mobility during ADLs:  (stood at bedside)      Extremity/Trunk Assessment Upper Extremity Assessment RUE Deficits / Details: recent (03/2022) proximal humerus fx still not allowed to move shoulder and NWBing; can move elbow to hand but with tremor (reports this is  not new)            Vision       Perception     Praxis      Cognition Arousal/Alertness: Lethargic Behavior During Therapy: Flat affect Overall Cognitive Status: No family/caregiver present to determine baseline cognitive functioning                                          Exercises       Shoulder Instructions       General Comments Pt noted to have fluid leaking from colostomy, at end of session. RN reports it was just emptied prior to OT invervention. OT assisting with pt care at end of session.    Pertinent Vitals/ Pain       Pain Assessment Pain Assessment: 0-10 Pain Score: 4  Pain Location: abdomen Pain Descriptors / Indicators: Discomfort, Sore, Grimacing, Guarding Pain Intervention(s): Limited activity within patient's tolerance, Monitored during session  Home Living                                          Prior Functioning/Environment              Frequency  Min 2X/week        Progress Toward Goals  OT Goals(current goals can now be found in the care plan section)  Progress towards OT goals: Progressing toward goals  Acute Rehab OT Goals Patient Stated Goal: to be in less pain OT Goal Formulation: With patient Time For Goal Achievement: 05/29/22 Potential to Achieve Goals: Fair  Plan Discharge plan remains appropriate    Co-evaluation                 AM-PAC OT "6 Clicks" Daily Activity     Outcome Measure   Help from another person eating meals?: Total Help from another person taking care of personal grooming?: A Little Help from another person toileting, which includes using toliet, bedpan, or urinal?: Total Help from another person bathing (including washing, rinsing, drying)?: Total Help from another person to put on and taking off regular upper body clothing?: Total Help from another person to put on and taking off regular lower body clothing?: Total 6 Click Score: 8    End of Session Equipment Utilized During Treatment: Gait belt  OT Visit Diagnosis: Other abnormalities of gait and mobility (R26.89);Muscle weakness (generalized) (M62.81);Pain   Activity Tolerance Patient limited by fatigue   Patient Left in bed;with call bell/phone within reach;with nursing/sitter in room   Nurse  Communication Need for lift equipment;Mobility status        Time: 1700-1733 OT Time Calculation (min): 33 min  Charges: OT General Charges $OT Visit: 1 Visit OT Treatments $Therapeutic Activity: 38-52 mins  05/24/2022  AB, OTR/L  Acute Rehabilitation Services  Office: [phone]   Tristan Schroeder 05/24/2022, 6:09 PM

## 2022-05-24 NOTE — Progress Notes (Signed)
SLP Cancellation Note  Patient Details Name: Chelsea Garcia MRN: [id number] DOB: 22-Jan-1933   Cancelled treatment:       Reason Eval/Treat Not Completed: Fatigue/lethargy limiting ability to participate   Claudine Mouton 05/24/2022, 3:00 PM

## 2022-05-25 ENCOUNTER — Inpatient Hospital Stay (HOSPITAL_COMMUNITY): Payer: Medicare Other

## 2022-05-25 DIAGNOSIS — Z7189 Other specified counseling: Secondary | ICD-10-CM

## 2022-05-25 DIAGNOSIS — Z4659 Encounter for fitting and adjustment of other gastrointestinal appliance and device: Secondary | ICD-10-CM | POA: Diagnosis not present

## 2022-05-25 DIAGNOSIS — Z9049 Acquired absence of other specified parts of digestive tract: Secondary | ICD-10-CM | POA: Diagnosis not present

## 2022-05-25 DIAGNOSIS — K567 Ileus, unspecified: Secondary | ICD-10-CM

## 2022-05-25 DIAGNOSIS — D473 Essential (hemorrhagic) thrombocythemia: Secondary | ICD-10-CM | POA: Diagnosis not present

## 2022-05-25 DIAGNOSIS — E43 Unspecified severe protein-calorie malnutrition: Secondary | ICD-10-CM | POA: Diagnosis not present

## 2022-05-25 DIAGNOSIS — K9189 Other postprocedural complications and disorders of digestive system: Secondary | ICD-10-CM

## 2022-05-25 DIAGNOSIS — K5720 Diverticulitis of large intestine with perforation and abscess without bleeding: Secondary | ICD-10-CM | POA: Diagnosis not present

## 2022-05-25 LAB — CBC WITH DIFFERENTIAL/PLATELET
Abs Immature Granulocytes: 4.88 10*3/uL — ABNORMAL HIGH (ref 0.00–0.07)
Basophils Absolute: 0.3 10*3/uL — ABNORMAL HIGH (ref 0.0–0.1)
Basophils Relative: 1 %
Eosinophils Absolute: 0.1 10*3/uL (ref 0.0–0.5)
Eosinophils Relative: 0 %
HCT: 32.7 % — ABNORMAL LOW (ref 36.0–46.0)
Hemoglobin: 9.8 g/dL — ABNORMAL LOW (ref 12.0–15.0)
Immature Granulocytes: 9 %
Lymphocytes Relative: 2 %
Lymphs Abs: 0.9 10*3/uL (ref 0.7–4.0)
MCH: 28.5 pg (ref 26.0–34.0)
MCHC: 30 g/dL (ref 30.0–36.0)
MCV: 95.1 fL (ref 80.0–100.0)
Monocytes Absolute: 9.9 10*3/uL — ABNORMAL HIGH (ref 0.1–1.0)
Monocytes Relative: 17 %
Neutro Abs: 40.7 10*3/uL — ABNORMAL HIGH (ref 1.7–7.7)
Neutrophils Relative %: 71 %
Platelets: 199 10*3/uL (ref 150–400)
RBC: 3.44 MIL/uL — ABNORMAL LOW (ref 3.87–5.11)
RDW: 17.3 % — ABNORMAL HIGH (ref 11.5–15.5)
WBC: 56.6 10*3/uL (ref 4.0–10.5)
nRBC: 0.1 % (ref 0.0–0.2)

## 2022-05-25 LAB — BASIC METABOLIC PANEL
Anion gap: 10 (ref 5–15)
BUN: 33 mg/dL — ABNORMAL HIGH (ref 8–23)
CO2: 17 mmol/L — ABNORMAL LOW (ref 22–32)
Calcium: 8.2 mg/dL — ABNORMAL LOW (ref 8.9–10.3)
Chloride: 105 mmol/L (ref 98–111)
Creatinine, Ser: 1.33 mg/dL — ABNORMAL HIGH (ref 0.44–1.00)
GFR, Estimated: 39 mL/min — ABNORMAL LOW (ref >60)
Glucose, Bld: 112 mg/dL — ABNORMAL HIGH (ref 70–99)
Potassium: 4 mmol/L (ref 3.5–5.1)
Sodium: 132 mmol/L — ABNORMAL LOW (ref 135–145)

## 2022-05-25 LAB — PHOSPHORUS: Phosphorus: 3.6 mg/dL (ref 2.5–4.6)

## 2022-05-25 LAB — MAGNESIUM: Magnesium: 1.8 mg/dL (ref 1.7–2.4)

## 2022-05-25 MED ORDER — LACTATED RINGERS IV SOLN: INTRAVENOUS | Status: DC

## 2022-05-25 NOTE — Progress Notes (Signed)
SLP Cancellation Note  Patient Details Name: Sera Terranova MRN: [id number] DOB: 09-23-1933   Cancelled treatment:        Attempted to see pt for swallowing assessment.  Spoke with RN. Pt with worsening ileus and restricted to clear liquids only.  RN reports good tolerance of liquids.  Evaluation deferred at this time 2/2 ileus.  SLP will reattempt when pt is medically appropriate for PO trials.   Kerrie Pleasure, MA, CCC-SLP Acute Rehabilitation Services Office: [phone] 05/25/2022, 9:53 AM

## 2022-05-25 NOTE — Progress Notes (Signed)
11 Days Post-Op   Subjective/Chief Complaint: Complains of abd pain and shortness of breath. Decreased mental status   Objective: Vital signs in last 24 hours: Temp:  [96.1 F (35.6 C)-97.9 F (36.6 C)] 97.4 F (36.3 C) (05/15 0756) Pulse Rate:  [72-81] 72 (05/15 0325) Resp:  [17-21] 20 (05/15 0325) BP: (91-100)/(44-57) 93/49 (05/15 0325) SpO2:  [93 %-99 %] 93 % (05/15 0325) Last BM Date : 05/24/22  Intake/Output from previous day: 05/14 0701 - 05/15 0700 In: 640 [P.O.:240; IV Piggyback:400] Out: 2175 [Urine:75; Stool:2100] Intake/Output this shift: Total I/O In: -  Out: 250 [Urine:250]  General appearance: cachectic and slowed mentation Resp: decreased breath sounds bilaterally Cardio: regular rate and rhythm GI: soft, distended  Lab Results:  Recent Labs    05/24/22 0144 05/25/22 0123  WBC 58.6* 56.6*  HGB 11.1* 9.8*  HCT 36.8 32.7*  PLT 245 199   BMET Recent Labs    05/24/22 0144 05/25/22 0123  NA 134* 132*  K 3.5 4.0  CL 106 105  CO2 18* 17*  GLUCOSE 122* 112*  BUN 30* 33*  CREATININE 1.33* 1.33*  CALCIUM 8.1* 8.2*   PT/INR No results for input(s): "LABPROT", "INR" in the last 72 hours. ABG No results for input(s): "PHART", "HCO3" in the last 72 hours.  Invalid input(s): "PCO2", "PO2"  Studies/Results: DG Abd Portable 1V  Result Date: 05/24/2022 CLINICAL DATA:  Abdominal distention EXAM: PORTABLE ABDOMEN - 1 VIEW COMPARISON:  Previous studies including CT done on 05/21/2022 FINDINGS: Bowel gas pattern is nonspecific. Skin staples are seen in lower abdomen. There is diffuse haziness in the abdomen suggesting moderate to large ascites. Part of the increased density in left upper and left mid abdomen may be due to enlarged spleen. IMPRESSION: Moderate to large ascites. Enlarged spleen. Nonspecific bowel gas pattern. Electronically Signed   By: Ernie Avena M.D.   On: 05/24/2022 15:25   DG CHEST PORT 1 VIEW  Result Date: 05/23/2022 CLINICAL  DATA:  Wheezing. Difficulty breathing. Diminished oxygen saturation. EXAM: PORTABLE CHEST 1 VIEW COMPARISON:  05/21/2022 FINDINGS: Low lung volumes. Heart is partially obscured by opacities in the lung bases. There are bilateral pleural effusions and bibasilar atelectasis or consolidation. Prominence of interstitial markings is also present. Findings are consistent with edema or infectious process and pleural effusions. IMPRESSION: 1. Bilateral pleural effusions and bibasilar atelectasis or consolidation. 2. Prominence of interstitial markings consistent with edema or infectious process. Electronically Signed   By: Norva Pavlov M.D.   On: 05/23/2022 13:34    Anti-infectives: Anti-infectives (From admission, onward)    Start     Dose/Rate Route Frequency Ordered Stop   05/22/22 0900  piperacillin-tazobactam (ZOSYN) IVPB 2.25 g  Status:  Discontinued        2.25 g 100 mL/hr over 30 Minutes Intravenous Every 8 hours 05/22/22 0814 05/23/22 0922   05/20/22 1015  fluconazole (DIFLUCAN) tablet 150 mg        150 mg Oral  Once 05/20/22 0915 05/20/22 1201   05/15/22 1900  ceFEPIme (MAXIPIME) 2 g in sodium chloride 0.9 % 100 mL IVPB  Status:  Discontinued        2 g 200 mL/hr over 30 Minutes Intravenous Every 24 hours 05/13/2022 1819 05/15/22 0109   05/15/22 0600  piperacillin-tazobactam (ZOSYN) IVPB 2.25 g        2.25 g 100 mL/hr over 30 Minutes Intravenous Every 8 hours 05/15/22 0302 05/21/22 2250   06/01/2022 1630  ceFEPIme (MAXIPIME) 2 g in  sodium chloride 0.9 % 100 mL IVPB        2 g 200 mL/hr over 30 Minutes Intravenous  Once 05/13/2022 1621 05/22/2022 1856   05/23/2022 1630  metroNIDAZOLE (FLAGYL) IVPB 500 mg        500 mg 100 mL/hr over 60 Minutes Intravenous  Once 06/01/2022 1621 05/12/2022 1856       Assessment/Plan: s/p Procedure(s): EXPLORATORY LAPAROTOMY, low anterior resection with end colostomy (N/A) LOW ANTERIOR BOWEL RESECTION Continue bowel rest. I think she may benefit from ng but pt  refused Consider tpn for nutrition May need palliative consult. May need to intubated soon  POD# 10 - s/p ex lap, LAR, end colostomy for perforated sigmoid diverticulitis - Dr. Cliffton Asters on 05/15/2022 - Path neg for malignancy  - CT 5/11 w/ ascites, hepatic cirrhosis, post op changes - no organized abscess.  - npo. May need ng - Completed post op abx. Monitor wbc and fever curve. Labs pending this am.  - WOC RN consult for new ostomy - Monitor wound - some dried drainage, no cellulitis, staples in place  - Pulm toilet (IS, add FV). CXR. Looks like allergy to albuterol, will go back and ask her what she has tolerated in the past.  - PT/OT. Plan SNF -concern for worsening ileus, she was backed down to clears over the weekend and increased distension today.    FEN: npo. Needs IVF. TRH following to assist w/ BP. XR abd today VTE: SCD's, SQH q 8h ID: cefepime/flagyl 5/4. Zosyn 5/5 >> completed 7 days post-op. Afebrile. WBC elevated. Will ask hematology if she could have CLL Foley: Spontaneous voids.  Dispo: SDU    UTI - Ucx from 5/4 w/ proteus mirabilis and E. Coli. Sens to zosyn. Completed course of abx . Acute on chronic anemia - Hgb stable at 11.3 Yeast inf - fluconazole x 1.  R humerus fx - 3/21. Per notes, Dr. Carola Frost planned non-op management with sling and NWB. Reached out to ortho to let her know she was here and get updated recs. RUE NWB and no resistance exercises to RUE (the shoulder specifically) but has passive and active assistive motion of the shoulder. no restrictions from her elbow, forearm, wrist or hand. CT done while here. Discussed with their team. Cont current care. OT Hepatosplenomegaly - cirrhosis with ascites CKD - Cr 1.3 GERD Endometriosis Thrombocytosis (follows with Dr. Mosetta Putt) - Plt 306 Sacral decubitus ulcer/pressure injuries - WOCN consult.  LOS: 10 days    Chevis Pretty III 05/25/2022

## 2022-05-25 NOTE — Evaluation (Signed)
Clinical/Bedside Swallow Evaluation Patient Details  Name: Chelsea Garcia MRN: [id number] Date of Birth: [birth year]  Today's Date: 05/25/2022 Time: SLP Start Time (ACUTE ONLY): 1034 SLP Stop Time (ACUTE ONLY): 1044 SLP Time Calculation (min) (ACUTE ONLY): 10 min  Past Medical History:  Past Medical History:  Diagnosis Date   Bronchiectasis (HCC)    followed by dr Irma Newness completed pulmonary rehan 06-02-2020   Cellulitis of right foot    recurrent cellulitis last 7 days finishes ciprofloxacin for on 11-26-2020   Colon polyps    Benign. 2 colonoscopies in past. Due now again.   Complication of anesthesia    "got dizzy in 70's"   Diarrhea    resolved now taking imodium ad prn   Dry eye 11/25/2020   both eyes   Endometriosis    required TAH when young   Essential tremor    since 40's   GERD (gastroesophageal reflux disease)    denis   Glaucoma    started in 40's both eyes   Low back pain    since 40's   Migraine    started in 50's last migraine 10 or 20 yrs ago per pt on 11-25-2020   Osteopenia    2014   Seasonal allergies    to Dust, mold and pollen.   Thrombocytosis 09/2013   hx of resolved follows up with dr Malachy Mood oncology   Tick bites    she likes out door activities, 2 this year (2015)   UPJ (ureteropelvic junction) obstruction 11/25/2020   left   Urinary frequency    Wears dentures    upper   Wears glasses    Past Surgical History:  Past Surgical History:  Procedure Laterality Date   BOWEL RESECTION  05/20/2022   Procedure: LOW ANTERIOR BOWEL RESECTION;  Surgeon: Andria Meuse, MD;  Location: [facility];  Service: General;;   CATARACT EXTRACTION Left 02/07/2013   right 2015   colonscopy     10 or 15 yrs ago per pt on 11-25-2020   CYSTOSCOPY W/ URETERAL STENT PLACEMENT Left 11/26/2020   Procedure: CYSTOSCOPY WITH BILATERAL RETROGRADE PYELOGRAM/URETERAL STENT PLACEMENT;  Surgeon: Jerilee Field, MD;  Location: [facility];  Service:  Urology;  Laterality: Left;  ONLY NEEDS 30 MIN   CYSTOSCOPY WITH URETEROSCOPY AND STENT PLACEMENT Left 01/12/2021   Procedure: CYSTOSCOPY WITH URETEROSCOPY AND STENT EXCHANGE;  Surgeon: Jerilee Field, MD;  Location: [facility];  Service: Urology;  Laterality: Left;   INGUINAL HERNIA REPAIR Left 01/09/2020   Procedure: LEFT INGUINAL HERNIA REPAIR WITH MESH;  Surgeon: Harriette Bouillon, MD;  Location: [facility];  Service: General;  Laterality: Left;   INSERTION OF MESH Left 01/09/2020   Procedure: INSERTION OF MESH;  Surgeon: Harriette Bouillon, MD;  Location: [facility];  Service: General;  Laterality: Left;   LAPAROTOMY N/A 05/19/2022   Procedure: EXPLORATORY LAPAROTOMY, low anterior resection with end colostomy;  Surgeon: Andria Meuse, MD;  Location: [facility];  Service: General;  Laterality: N/A;   TOTAL ABDOMINAL HYSTERECTOMY  09/10/1968   TUBAL LIGATION     yrs ago   HPI:  Chelsea Garcia is an 89 y.o. female who presented to ER on 05/18/2022 with abdominal pain.  CT abd/pelvis showed distal perforated diverticulitis.  General surgery evaluated and she was taken for ex-lap with LAR and end colostomy. She had complications of hypotension transiently and was managed by critical care as well. Pt now with ileus and abdominal  distension. Pt with PMH essential thrombocytosis (JAK2 positive) on hydroxyurea since 2015, anemia chronic disease, bronchiectasis.    Assessment / Plan / Recommendation  Clinical Impression  Pt presents with clinical indicators of pharyngeal dysphagia. Pt was partially reclined 2/2 abdominal distention from ileus which presses on her diaphragm and makes it difficult for her to breath.  Respirations shallow and pt with extremely weak cued cough.  Pt unable to generate volitional swallow despite effort.  Laryngeal bobbing noted.  Pt given two small spoons of water (~1/2 tsp).  Pt with decreased labial seal.  There was expulsion of trace  amount of thin liquid with the swallow from R side.  Pt requried multiple swallows per bolus.  There was seemingly reduced hyolaryngeal elevation and excusion on palpation.  After each trial pt stated "that's enough."  Pt seems unlikely to meet her nutritional needs orally at this time.   Daughter reports that cough has changed from dry to more crackling lately, but pt did not exhibit cough or throat clear with the very small amount of PO intake she accepted during this assessment. Pt with CXR 5/13 showing "1. Bilateral pleural effusions and bibasilar atelectasis or consolidation. 2. Prominence of interstitial markings consistent with edema or infectious process."  After each trial pt belched, this is suspected to be related to abdominal distension rather than a true esophageal dysphagia at this time.  Daughter reports no hx of esophageal issues or reflux.  MD arrived during assessment. Pt now agreeable to placement of NG to address ileus/distention. Pt is not appropriate for further evaluation of swallowing at this time, but would likely benefit from [facility] if symptoms persist following resolution of ileus.   Will defer diet determination to team.   SLP Visit Diagnosis: Dysphagia, unspecified (R13.10)    Aspiration Risk  Moderate aspiration risk    Diet Recommendation  (Defer diet orders to team)   Medication Administration: Via alternative means    Other  Recommendations Oral Care Recommendations: Oral care BID    Recommendations for follow up therapy are one component of a multi-disciplinary discharge planning process, led by the attending physician.  Recommendations may be updated based on patient status, additional functional criteria and insurance authorization.  Follow up Recommendations  (TBD)      Assistance Recommended at Discharge  N/A  Functional Status Assessment Patient has had a recent decline in their functional status and demonstrates the ability to make significant improvements  in function in a reasonable and predictable amount of time.  Frequency and Duration min 2x/week  2 weeks       Prognosis   TBD     Swallow Study   General Date of Onset: 06/10/2022 HPI: Chelsea Garcia is an 89 y.o. female who presented to ER on 06/01/2022 with abdominal pain.  CT abd/pelvis showed distal perforated diverticulitis.  General surgery evaluated and she was taken for ex-lap with LAR and end colostomy. She had complications of hypotension transiently and was managed by critical care as well. Pt now with ileus and abdominal distension. Pt with PMH essential thrombocytosis (JAK2 positive) on hydroxyurea since 2015, anemia chronic disease, bronchiectasis. Type of Study: Bedside Swallow Evaluation Previous Swallow Assessment: None Diet Prior to this Study: Clear liquid diet Respiratory Status: Nasal cannula History of Recent Intubation: No Behavior/Cognition: Alert Oral Cavity Assessment: Within Functional Limits Oral Care Completed by SLP: No Oral Cavity - Dentition: Adequate natural dentition Self-Feeding Abilities: Needs assist Patient Positioning: Partially reclined (2/2 abdominal distension) Baseline Vocal Quality: Low  vocal intensity Volitional Cough: Weak Volitional Swallow: Unable to elicit    Oral/Motor/Sensory Function Overall Oral Motor/Sensory Function: Mild impairment Facial ROM: Reduced right;Reduced left Facial Symmetry: Within Functional Limits Facial Strength: Reduced left Lingual ROM: Within Functional Limits Lingual Symmetry: Within Functional Limits Lingual Strength: Reduced Velum: Within Functional Limits Mandible: Within Functional Limits   Ice Chips Ice chips: Not tested   Thin Liquid Thin Liquid: Impaired Presentation: Spoon Oral Phase Functional Implications: Left anterior spillage Pharyngeal  Phase Impairments: Decreased hyoid-laryngeal movement;Multiple swallows    Nectar Thick Nectar Thick Liquid: Not tested   Honey Thick Honey Thick Liquid: Not  tested   Puree Puree: Not tested   Solid     Solid: Not tested      Kerrie Pleasure, MA, CCC-SLP Acute Rehabilitation Services Office: [phone] 05/25/2022,11:07 AM

## 2022-05-25 NOTE — Progress Notes (Addendum)
PROGRESS NOTE Leathie Curtsinger  ZOX:[id number] DOB: 1933-01-12 DOA: 05/21/2022 PCP: Darrow Bussing, MD  Brief Narrative/Hospital Course: 89 y.o. female with PMH MPN/ET with elevated WBC and platelet counts (JAK2 positive) on hydroxyurea since 2015, anemia chronic disease, bronchiectasis who presented to ER on 05/16/2022 with abdominal pain. CT abd/pelvis showed distal perforated diverticulitis.  General surgery evaluated and she was taken for ex-lap with LAR and end colostomy. She had complications of hypotension transiently and was managed by critical care as well.  Patient has prolonged hospitalization complicated by ileus.  Manage antibiotic postop and completed seen by wound care nurse ostomy nurse and continue on pulmonary toileting, PT OT    Subjective: Seen and examined C/o abd pain and shortness of breath  due to abdomen distension having serosanginuous output in colostomy PMT consulted yesterday POA at bedside CCS making NPO and advised NGT-initially refused but now agreeable Overnight BP soft about the same 90s, on 3l [place] [facility] with mild hyponatremia 132 creatinine stable at 1.3 with EGFR 39, bicarb 17, persistent leukocytosis 56.6 with anemia 9.9,   Assessment and Plan: Principal Problem:   Perforation of sigmoid colon due to diverticulitis Active Problems:   S/P partial colectomy   Right humeral fracture   Essential thrombocytosis (HCC)   Normocytic anemia   Pressure injury of skin   Hypotension   UTI (urinary tract infection)   Leukocytosis   Sacral decubitus ulcer, stage III (HCC)   Protein-calorie malnutrition, severe   Perforated sigmoid diverticulitis Postop ileus: Patient is a S/P EX LAP with LAR- end colostomy on 05/15/22.  General surgery following, has been on clear liquid diet but now with worsening abdominal distention.  Changing to n.p.o., NG tube for decompression.  Initially received cefepime and if Flagyl then on Zosyn 5/5> 5/13,S/P fluconazole 5/10 x 1.  Continue wound  care, continue colostomy care, continue plan of care as per general surgery following closely continue PT OT as tolerated aggressive pulmonary toileting I-S, flutter valve. She may need TPN soon to augment nutritional status- sent message to CCS. discussed with palliative care.  Moderate to Large volume ascites noted in x-ray  5/14 in the setting of poor nutritional status severe hypoalbuminemia, on albumin 25 g every 6 hours. She is post op, colostomy in place.  Hypovolemic shock Soft blood pressure/hypotension: In the setting of enteral vascular depletion, hypoalbuminemia, seen by PCCM initially responded to IV fluids. BP remains soft, continue midodrine, albumin. ivf held 5/14 but W/ ongoing ileus, npo , ad soft BO -add gentle ivf, and monitor respiratory status.   Acute hypoxic respiratory failure: Currently on 3 to nasal cannula, at risk of decompensation, she elected for DNR/DNI. Cotn IS, FV.  Sleepy/lethargy concern for acute metabolic encephalopathy in the setting of prolonged hospitalization postop ileus malnutrition.  Patient remains at risk, monitor closely  Right proximal humeral fracture,initially diagnosed 03/30/22. Hhas been seen by ortho previously and discussed with again during this admission; non-op management> recommended to continue NWB to RUE, no resistance exercises to RUE (the shoulder specifically) but has passive and active assistive motion of the shoulder.No restrictions from her elbow, forearm, wrist or hand. CT repeated and stable: "Displaced fracture of the right proximal humerus with interval resorption of fracture margins, early periosteal reaction and callus formation but no bony bridging" fu w/ Dr Carola Frost   Possible UTI: With Proteus treated with Zosyn   MCN/ET w/ JAK+ with anemia, leukocytosis Enlarged spleen: F/b Dr Mosetta Putt, last see in 04/20/22.  On hydroxyzine as outpatient.  Has  worsening Dowless count in the setting of #1/acute illness.  Discussed Dr. Mosetta Putt less  likely CLL. Recent Labs  Lab 05/23/22 1045 05/24/22 0144 05/25/22 0123  HGB 11.6* 11.1* 9.8*  HCT 39.4 36.8 32.7*  WBC 72.8* 58.6* 56.6*  PLT 294 245 199    Sacral decubitus ulcer stage II: Continue wound care, offloading  Severe malnutrition: At risk of further malnutrition, in the setting of postop status.  If family agreeable would like to start TPN. Again she is at high risk, at risk of decompensation  GOC: Discussed with patient and patient's bedside-POA informs that initially admitted as a full code but patient and herself discussed and and requesting DNR, palliative care has been consulted.  She is at high risk for decompensation, she remains  seriously ill, prognosis is guarded to poor.   Pressure ulcer of sacrum stage III and right heel stage I POA see below Pressure Injury 05/15/22 Sacrum Mid Stage 3 -  Full thickness tissue loss. Subcutaneous fat may be visible but bone, tendon or muscle are NOT exposed. (Active)  05/15/22 1015  Location: Sacrum  Location Orientation: Mid  Staging: Stage 3 -  Full thickness tissue loss. Subcutaneous fat may be visible but bone, tendon or muscle are NOT exposed.  Wound Description (Comments):   Present on Admission: Yes  Dressing Type Foam - Lift dressing to assess site every shift 05/24/22 2000     Pressure Injury 05/15/22 Heel Right Stage 1 -  Intact skin with non-blanchable redness of a localized area usually over a bony prominence. (Active)  05/15/22 1015  Location: Heel  Location Orientation: Right  Staging: Stage 1 -  Intact skin with non-blanchable redness of a localized area usually over a bony prominence.  Wound Description (Comments):   Present on Admission: Yes  Dressing Type Foam - Lift dressing to assess site every shift 05/24/22 2000   DVT prophylaxis: heparin injection 5,000 Units Start: 05/15/22 0600 SCD's Start: 05/15/22 0354 Place TED hose Start: 05/15/22 0110 Code Status:   Code Status: DNR Family Communication:  plan of care discussed with patient/DAUGHTER IN LAW poa at bedside. Patient status is:  INPATIENT  because of Ieus post op Level of care: Progressive   Dispo: The patient is from: Home            Anticipated disposition: TBD Objective: Vitals last 24 hrs: Vitals:   05/24/22 2314 05/25/22 0325 05/25/22 0756 05/25/22 1118  BP: (!) 91/44 (!) 93/49  (!) 92/50  Pulse: 77 72    Resp: 17 20    Temp: 97.7 F (36.5 C) 97.9 F (36.6 C) (!) 97.4 F (36.3 C) (!) 97.5 F (36.4 C)  TempSrc: Oral Oral Axillary Axillary  SpO2: 95% 93%    Weight:      Height:       Weight change:   Physical Examination: General exam: alert awake,ill and frail appearing HEENT:Oral mucosa moist, Ear/Nose WNL grossly Respiratory system: bilaterally diminshed BS,no use of accessory muscle Cardiovascular system: S1 & S2 +,No JVD. Gastrointestinal system: Abdomen soft/firm, incision site dressed, abdomen is distended,  colostomy+ w/ serosanguineous discharge Nervous System:Alert, awake, moving extremities some. Extremities: LE edema neg,distal peripheral pulses palpable.  Skin: No rashes,no icterus. MSK: Normal muscle bulk,tone, power  Medications reviewed:  Scheduled Meds:  (feeding supplement) PROSource Plus  30 mL Oral BID BM   acetaminophen  1,000 mg Oral Q8H   aspirin EC  81 mg Oral Daily   bethanechol  10 mg Oral TID  brimonidine  1 drop Both Eyes BID   calcium carbonate  1,250 mg Oral Q breakfast   feeding supplement  1 Container Oral TID BM   ferrous sulfate  325 mg Oral Q breakfast   guaiFENesin  600 mg Oral BID   heparin injection (subcutaneous)  5,000 Units Subcutaneous Q8H   latanoprost  1 drop Both Eyes QPM   leptospermum manuka honey  1 Application Topical Daily   levothyroxine  50 mcg Oral q morning   midodrine  5 mg Oral TID with meals   mometasone-formoterol  2 puff Inhalation BID   Netarsudil Dimesylate  1 drop Ophthalmic QPM   nutrition supplement (JUVEN)  1 packet Oral BID BM    mouth rinse  15 mL Mouth Rinse 4 times per day   tamsulosin  0.4 mg Oral QHS   Continuous Infusions:  sodium chloride 5 mL/hr at 05/21/22 1847   albumin human 25 g (05/25/22 0559)   lactated ringers 50 mL/hr at 05/25/22 1141   Diet Order             Diet NPO time specified  Diet effective now                   Intake/Output Summary (Last 24 hours) at 05/25/2022 1332 Last data filed at 05/25/2022 0757 Gross per 24 hour  Intake 520 ml  Output 2425 ml  Net -1905 ml   Net IO Since Admission: 4,149.38 mL [05/25/22 1332]  Wt Readings from Last 3 Encounters:  05/24/22 61 kg  03/30/22 49.9 kg  02/17/22 45.8 kg     Unresulted Labs (From admission, onward)     Start     Ordered   05/25/22 0500  Basic metabolic panel  Daily,   R     Question:  Specimen collection method  Answer:  Lab=Lab collect   05/24/22 1821   05/25/22 0500  CBC with Differential/Platelet  Daily,   R     Question:  Specimen collection method  Answer:  Lab=Lab collect   05/24/22 1821   05/24/22 0500  Magnesium  Daily,   R     Question:  Specimen collection method  Answer:  IV Team=IV Team collect   05/23/22 1421   05/24/22 0500  Phosphorus  Daily,   R     Question:  Specimen collection method  Answer:  IV Team=IV Team collect   05/23/22 1421          Data Reviewed: I have personally reviewed following labs and imaging studies CBC: Recent Labs  Lab 05/21/22 0102 05/22/22 0827 05/23/22 1045 05/24/22 0144 05/25/22 0123  WBC 68.1* 63.4* 72.8* 58.6* 56.6*  NEUTROABS  --  60.2*  --   --  40.7*  HGB 10.3* 11.3* 11.6* 11.1* 9.8*  HCT 34.2* 38.3 39.4 36.8 32.7*  MCV 92.7 95.8 97.5 94.6 95.1  PLT 389 306 294 245 199   Basic Metabolic Panel: Recent Labs  Lab 05/20/22 0121 05/21/22 0102 05/23/22 1045 05/24/22 0144 05/25/22 0123  NA 133* 134* 134* 134* 132*  K 4.6 3.9 3.7 3.5 4.0  CL 108 105 107 106 105  CO2 14* 18* 15* 18* 17*  GLUCOSE 103* 113* 164* 122* 112*  BUN 36* 37* 30* 30* 33*   CREATININE 1.65* 1.73* 1.38* 1.33* 1.33*  CALCIUM 7.5* 8.2* 8.1* 8.1* 8.2*  MG  --   --   --  1.4* 1.8  PHOS  --   --   --  3.4  3.6  GFR: Estimated Creatinine Clearance: 23.4 mL/min (A) (by C-G formula based on SCr of 1.33 mg/dL (H)). Liver Function Tests: Recent Labs  Lab 05/22/22 0419  AST 11*  ALT 11  ALKPHOS 69  BILITOT 0.5  PROT 4.5*  ALBUMIN 2.1*   No results found for this or any previous visit (from the past 240 hour(s)).  Antimicrobials: Anti-infectives (From admission, onward)    Start     Dose/Rate Route Frequency Ordered Stop   05/22/22 0900  piperacillin-tazobactam (ZOSYN) IVPB 2.25 g  Status:  Discontinued        2.25 g 100 mL/hr over 30 Minutes Intravenous Every 8 hours 05/22/22 0814 05/23/22 0922   05/20/22 1015  fluconazole (DIFLUCAN) tablet 150 mg        150 mg Oral  Once 05/20/22 0915 05/20/22 1201   05/15/22 1900  ceFEPIme (MAXIPIME) 2 g in sodium chloride 0.9 % 100 mL IVPB  Status:  Discontinued        2 g 200 mL/hr over 30 Minutes Intravenous Every 24 hours 05/23/2022 1819 05/15/22 0109   05/15/22 0600  piperacillin-tazobactam (ZOSYN) IVPB 2.25 g        2.25 g 100 mL/hr over 30 Minutes Intravenous Every 8 hours 05/15/22 0302 05/21/22 2250   06/01/2022 1630  ceFEPIme (MAXIPIME) 2 g in sodium chloride 0.9 % 100 mL IVPB        2 g 200 mL/hr over 30 Minutes Intravenous  Once 06/01/2022 1621 06/03/2022 1856   05/16/2022 1630  metroNIDAZOLE (FLAGYL) IVPB 500 mg        500 mg 100 mL/hr over 60 Minutes Intravenous  Once 05/31/2022 1621 05/22/2022 1856      Culture/Microbiology    Component Value Date/Time   SDES BLOOD LEFT HAND 05/11/2022 1725   SPECREQUEST  05/16/2022 1725    BOTTLES DRAWN AEROBIC AND ANAEROBIC Blood Culture adequate volume   CULT  05/20/2022 1725    NO GROWTH 5 DAYS Performed at [facility], [address], [place], [place] [address]    REPTSTATUS 05/19/2022 FINAL 05/31/2022 1725    Radiology Studies: DG Abd Portable 1V  Result  Date: 05/24/2022 CLINICAL DATA:  Abdominal distention EXAM: PORTABLE ABDOMEN - 1 VIEW COMPARISON:  Previous studies including CT done on 05/21/2022 FINDINGS: Bowel gas pattern is nonspecific. Skin staples are seen in lower abdomen. There is diffuse haziness in the abdomen suggesting moderate to large ascites. Part of the increased density in left upper and left mid abdomen may be due to enlarged spleen. IMPRESSION: Moderate to large ascites. Enlarged spleen. Nonspecific bowel gas pattern. Electronically Signed   By: Ernie Avena M.D.   On: 05/24/2022 15:25     LOS: 10 days   Lanae Boast, MD [facility]  05/25/2022, 1:32 PM

## 2022-05-25 NOTE — Consult Note (Signed)
WOC Nurse ostomy follow up Met with patient's DIL at bedside  Stoma type/location: LLQ, colostomy  Patient is not able to participate in care, she will be totally dependent in the care of her ostomy  Stomal assessment/size: 1 3/4" round, budded, pink, moist Peristomal assessment: intact  Treatment options for stomal/peristomal skin: 2" skin barrier ring Output liquid green, small amount of seedy green, 100CC reported to bedside nursing for I/O Ostomy pouching: 2pc. 2 3/4" with 2"skin barrier ring  Education provided:  DIL changed pouch essentially, she has reviewed all of the educational materials in depth as well She cut new skin barrier, emptied current pouch, used wick to clean spout; removed old pouch, cleansed peristomal skin, applied barrier ring, skin barrier and attached new pouch. She is independent with lock and roll closure. She required minimal assistance and/or cueing during process.   Enrolled patient in [place] Secure Start Discharge program: Yes, previously per [facility]. [facility] WOC   Requested Licensed conveyancer to order skin barriers [place] # [phone]) 2 3/4" pouches [place] # 2) and barrier rings [place] # [phone]) for patient's room. Notified bedside nurse of same.  Bedside nurse also participated in teaching session for her own knowledge. Verbalized understanding of pouch change frequency and need to change if leaking.   WOC Nurse will follow along with you for continued support with ostomy teaching and care Aella Ronda [facility], RN, [place], CNS, [place] [phone]

## 2022-05-25 NOTE — Progress Notes (Addendum)
PT Cancellation Note  Patient Details Name: Chelsea Garcia MRN: [id number] DOB: [birth year]   Cancelled Treatment:    Reason Eval/Treat Not Completed: (P) Patient not medically ready RN defer, pt not tolerating gastric tube and too lethargic to attempt therapies at this time. Noted Palliative discussions being held this date, will continue efforts next date per PT plan of care pending pt/family wishes.  Dorathy Kinsman Trendon Zaring 05/25/2022, 4:32 PM

## 2022-05-26 DIAGNOSIS — R627 Adult failure to thrive: Secondary | ICD-10-CM | POA: Diagnosis not present

## 2022-05-26 DIAGNOSIS — Z515 Encounter for palliative care: Secondary | ICD-10-CM | POA: Diagnosis not present

## 2022-05-26 DIAGNOSIS — J9601 Acute respiratory failure with hypoxia: Secondary | ICD-10-CM | POA: Diagnosis not present

## 2022-05-26 DIAGNOSIS — L89153 Pressure ulcer of sacral region, stage 3: Secondary | ICD-10-CM

## 2022-05-26 DIAGNOSIS — R188 Other ascites: Secondary | ICD-10-CM

## 2022-05-26 DIAGNOSIS — K5720 Diverticulitis of large intestine with perforation and abscess without bleeding: Secondary | ICD-10-CM | POA: Diagnosis not present

## 2022-05-26 DIAGNOSIS — R601 Generalized edema: Secondary | ICD-10-CM

## 2022-05-26 DIAGNOSIS — Z7189 Other specified counseling: Secondary | ICD-10-CM

## 2022-05-26 LAB — CBC WITH DIFFERENTIAL/PLATELET
Abs Immature Granulocytes: 0 10*3/uL (ref 0.00–0.07)
Basophils Absolute: 0 10*3/uL (ref 0.0–0.1)
Basophils Relative: 0 %
Eosinophils Absolute: 0 10*3/uL (ref 0.0–0.5)
Eosinophils Relative: 0 %
HCT: 32.5 % — ABNORMAL LOW (ref 36.0–46.0)
Hemoglobin: 9.8 g/dL — ABNORMAL LOW (ref 12.0–15.0)
Lymphocytes Relative: 4 %
Lymphs Abs: 3.2 10*3/uL (ref 0.7–4.0)
MCH: 28.7 pg (ref 26.0–34.0)
MCHC: 30.2 g/dL (ref 30.0–36.0)
MCV: 95 fL (ref 80.0–100.0)
Monocytes Absolute: 2.4 10*3/uL — ABNORMAL HIGH (ref 0.1–1.0)
Monocytes Relative: 3 %
Neutro Abs: 73.7 10*3/uL — ABNORMAL HIGH (ref 1.7–7.7)
Neutrophils Relative %: 93 %
Platelets: 240 10*3/uL (ref 150–400)
RBC: 3.42 MIL/uL — ABNORMAL LOW (ref 3.87–5.11)
RDW: 17.5 % — ABNORMAL HIGH (ref 11.5–15.5)
WBC: 79.2 10*3/uL (ref 4.0–10.5)
nRBC: 0 /100 WBC
nRBC: 0.2 % (ref 0.0–0.2)

## 2022-05-26 LAB — BASIC METABOLIC PANEL
Anion gap: 9 (ref 5–15)
BUN: 44 mg/dL — ABNORMAL HIGH (ref 8–23)
CO2: 19 mmol/L — ABNORMAL LOW (ref 22–32)
Calcium: 8.9 mg/dL (ref 8.9–10.3)
Chloride: 104 mmol/L (ref 98–111)
Creatinine, Ser: 1.43 mg/dL — ABNORMAL HIGH (ref 0.44–1.00)
GFR, Estimated: 36 mL/min — ABNORMAL LOW (ref >60)
Glucose, Bld: 108 mg/dL — ABNORMAL HIGH (ref 70–99)
Potassium: 4.2 mmol/L (ref 3.5–5.1)
Sodium: 132 mmol/L — ABNORMAL LOW (ref 135–145)

## 2022-05-26 LAB — PHOSPHORUS: Phosphorus: 4.2 mg/dL (ref 2.5–4.6)

## 2022-05-26 LAB — MAGNESIUM: Magnesium: 1.8 mg/dL (ref 1.7–2.4)

## 2022-05-26 MED ORDER — BETHANECHOL CHLORIDE 10 MG PO TABS: 10.0000 mg | ORAL_TABLET | Freq: Three times a day (TID) | ORAL | Status: DC

## 2022-05-26 MED ORDER — GLYCOPYRROLATE 0.2 MG/ML IJ SOLN: 0.4000 mg | INTRAMUSCULAR | Status: DC | PRN

## 2022-05-26 MED ORDER — HYDROMORPHONE HCL 1 MG/ML IJ SOLN: 1.0000 mg | INTRAMUSCULAR | Status: DC | PRN

## 2022-05-26 MED ORDER — MORPHINE SULFATE (CONCENTRATE) 10 MG/0.5ML PO SOLN: 5.0000 mg | ORAL | Status: DC | PRN

## 2022-05-26 MED ORDER — LORAZEPAM 2 MG/ML IJ SOLN: 1.0000 mg | INTRAMUSCULAR | Status: DC | PRN

## 2022-05-26 MED ORDER — LEVOTHYROXINE SODIUM 100 MCG/5ML IV SOLN
25.0000 ug | Freq: Every day | INTRAVENOUS | Status: DC
  Filled 2022-05-26: qty 5

## 2022-05-26 MED ORDER — BIOTENE DRY MOUTH MT LIQD: 15.0000 mL | OROMUCOSAL | Status: DC | PRN

## 2022-05-26 MED ORDER — ACETAMINOPHEN 650 MG RE SUPP: 650.0000 mg | Freq: Four times a day (QID) | RECTAL | Status: DC | PRN

## 2022-05-26 MED ORDER — MIDODRINE HCL 5 MG PO TABS: 10.0000 mg | ORAL_TABLET | Freq: Three times a day (TID) | ORAL | Status: DC

## 2022-05-26 MED ORDER — ALBUMIN HUMAN 25 % IV SOLN: 25.0000 g | Freq: Four times a day (QID) | INTRAVENOUS | Status: DC

## 2022-05-26 MED ORDER — POLYVINYL ALCOHOL 1.4 % OP SOLN: 1.0000 [drp] | Freq: Four times a day (QID) | OPHTHALMIC | Status: DC | PRN

## 2022-06-11 NOTE — Progress Notes (Signed)
PROGRESS NOTE Gyanna Dipierro  ZOX:[id number] DOB: 05/30/1933 DOA: 05/31/2022 PCP: Darrow Bussing, MD  Brief Narrative/Hospital Course: 89 y.o. female with PMH MPN/ET with elevated WBC and platelet counts (JAK2 positive) on hydroxyurea since 2015, anemia chronic disease, bronchiectasis who presented to ER on 06/01/2022 with abdominal pain. CT abd/pelvis showed distal perforated diverticulitis.  General surgery evaluated and she was taken for ex-lap with LAR and end colostomy. She had complications of hypotension transiently and was managed by critical care as well.  Patient has prolonged hospitalization complicated by ileus.  Manage antibiotic postop and completed seen by wound care nurse ostomy nurse and continue on pulmonary toileting, PT OT    Subjective: Seen and examined She was awake this am Now sleeping/lethargic Tachypneic abdomen further distended- not much NG output DIL HCPOA at bedside Agreeable for paracentesis to provide her relief and comfort   Assessment and Plan: Principal Problem:   Perforation of sigmoid colon due to diverticulitis Active Problems:   S/P partial colectomy   Right humeral fracture   Essential thrombocytosis (HCC)   Normocytic anemia   Pressure injury of skin   Hypotension   UTI (urinary tract infection)   Leukocytosis   Sacral decubitus ulcer, stage III (HCC)   Protein-calorie malnutrition, severe   Perforated sigmoid diverticulitis Postop ileus Blood in colostomy bag 5/16: Patient is a S/P EX LAP with LAR- end colostomy on 05/15/22.Initially received cefepime and if Flagyl then on Zosyn 5/5> 5/13,S/P fluconazole 5/10 x 1. Surgery following closely for ongoing ileus> NG tube) started 5/15>  continue current wound care colostomy.  Continue gentle IV fluid hydration.  Holding off on TPN until family meeting.  Patient is lethargic frail and doing poorly with ongoing ileus, ascites.  Encourage PT OT as tolerated along with pulmonary toileting. Change meds to NG  tube/ and IV.  RN noticed blood in colostomy bag will hold heparin and ASPIRIN.  Hepatic cirrhosis w/ mild splenomegaly and dilated portal vein w/ portal venous hypertension Moderate to Large volume ascites Extensive body wall mesenteric and retroperitoneal edema Small Bilateral pleural effusions on 5/11 CT: in the setting of hepatic cirrhosis hypoalbuminemia,poor nutritional status.Agreeable for paracentesis to provide her relief and comfort. Cont on iv Albumin.  Hypovolemic shock Soft blood pressure/hypotension: In the setting of enteral vascular depletion,hypoalbuminemia, seen by PCCM initially responded to IV fluids. BP remains soft, continue midodrine, albumin. ivf held 5/14 but W/ ongoing ileus, npo added gentle ivf, and on iv albumin q6hr   Acute hypoxic respiratory failure: Currently needin [place] and today upped to HFNC at 9l and overall worsening resp status due to ascites and abdomen distension. She is now DNR/DNI.palling paracentesis to relive the pressure.  Sleepy/lethargy concern for acute metabolic encephalopathy in the setting of prolonged hospitalization postop ileus malnutrition. Patient remains at high risk for decompensation.   Right proximal humeral fracture,initially diagnosed 03/30/22. Hhas been seen by ortho previously and discussed with again during this admission; non-op management> recommended to continue NWB to RUE, no resistance exercises to RUE (the shoulder specifically) but has passive and active assistive motion of the shoulder.No restrictions from her elbow, forearm, wrist or hand. CT repeated and stable: "Displaced fracture of the right proximal humerus with interval resorption of fracture margins, early periosteal reaction and callus formation but no bony bridging" fu w/ Dr Carola Frost   Possible UTI: With Proteus treated with Zosyn. See #1   MPN/ Long standing ET w/ JAK+ with anemia, leukocytosis- probably myelofibrosis F/b Dr Mosetta Putt, last see in 04/20/22.  On  hydroxyurea  as outpatient.  Has worsening wbc counts in the setting of #1/acute illness.  Discussed Dr. Mosetta Putt less likely CLL.she is afebrile. Last CT abd 5/11 -no evidence of abscess or infection. Recent Labs  Lab 05/24/22 0144 05/25/22 0123 06/09/2022 0543  HGB 11.1* 9.8* 9.8*  HCT 36.8 32.7* 32.5*  WBC 58.6* 56.6* 79.2*  PLT 245 199 240     Sacral decubitus ulcer stage II: Continue wound care, offloading  Severe malnutrition: At risk of further malnutrition, in the setting of postop status.  Holding off on starting TPN.  Family meeting  Abnormal CT scan 5/11 Multiple large stones in both kidneys with mild right hydronephrosis persistent, no left-sided hydronephrosis. Markedly distended gallbladder without calcified stone disease evident  GOC: Palliative care has been involved has had extensive discussion at this time patient is DNR/DNI.  Patient remains at high risk of decompensation , her son is arriving from [place] overnight, planning for palliative meeting as early as tomorrow morning but I am concerned about her overall clinical situation.  Will attempt to do paracentesis to relieve her shortness of breath and abdominal distention.  BP remains soft, she has anasarca.  Healthcare power of attorney has been updated.  They are considering not continuing full scope of care> awaiting on family meeting.  Addendum:12:15 pm Informed by nursing staff that procedure was canceled, as patient had further worsening of respiratory status hypoxia along with soft blood pressure. Came and saw the patient urgently, [name] at bedside- patient minimally responsive on nonrebreather Palliative care also arrived at the bedside.  [name] has been in touch with patient's other son Fara Olden who is on the flight and informed of her current clinical situation. I requested PCCM ICU consult to see if anything can be done as a temporizing measures/bedside paracentesis-with hypotension, respiratory distress needing nonrebreather. PCCM  arrived at bedside Informed by palliative care that after patient's son [name]  arrives they will transition to comfort measures/no escalation of care.  Pressure ulcer of sacrum stage III and right heel stage I POA see below Pressure Injury 05/15/22 Sacrum Mid Stage 3 -  Full thickness tissue loss. Subcutaneous fat may be visible but bone, tendon or muscle are NOT exposed. (Active)  05/15/22 1015  Location: Sacrum  Location Orientation: Mid  Staging: Stage 3 -  Full thickness tissue loss. Subcutaneous fat may be visible but bone, tendon or muscle are NOT exposed.  Wound Description (Comments):   Present on Admission: Yes  Dressing Type Foam - Lift dressing to assess site every shift 05/24/22 2000     Pressure Injury 05/15/22 Heel Right Stage 1 -  Intact skin with non-blanchable redness of a localized area usually over a bony prominence. (Active)  05/15/22 1015  Location: Heel  Location Orientation: Right  Staging: Stage 1 -  Intact skin with non-blanchable redness of a localized area usually over a bony prominence.  Wound Description (Comments):   Present on Admission: Yes  Dressing Type Foam - Lift dressing to assess site every shift 05/24/22 2000   DVT prophylaxis: heparin injection 5,000 Units Start: 05/15/22 0600 SCD's Start: 05/15/22 0354 Place TED hose Start: 05/15/22 0110 Code Status:   Code Status: DNR Family Communication: plan of care discussed with patient/HCPOA at bedside \\Patient  status is:  INPATIENT  because of Ieus post op Level of care: Progressive   Dispo: The patient is from: Home            Anticipated disposition: TBD Objective: Vitals last 24 hrs: Vitals:  05/25/22 2333 05/28/2022 0335 05/18/2022 0754 06/02/2022 0828  BP: (!) 97/49 (!) 99/45 (!) 94/45   Pulse: 99 93    Resp: 16 18    Temp:  97.7 F (36.5 C) 97.6 F (36.4 C)   TempSrc: Oral Oral Axillary   SpO2: (!) 87% 95%  95%  Weight:      Height:       Weight change:   Physical Examination: General  exam: lethargic, NGT+ HEENT:Oral mucosa moist, Ear/Nose WNL grossly, dentition normal. Respiratory system:Bilaterally diminished BS, tachypneic  w/ some use of accessory muscle Cardiovascular system:S1 & S2 +, regular rate, JVD neg. Gastrointestinal system:Abdomen firm with significant distension incision site intact w/ colostomy+ Nervous System:lethargic. Extremities: LE ankle edema neg, lower extremities warm. Skin:No rashes,no icterus. ZOX:WRUE and thin muscle bulk,tone, power.   Medications reviewed:  Scheduled Meds:  (feeding supplement) PROSource Plus  30 mL Oral BID BM   acetaminophen  1,000 mg Oral Q8H   aspirin EC  81 mg Oral Daily   bethanechol  10 mg Oral TID   brimonidine  1 drop Both Eyes BID   calcium carbonate  1,250 mg Oral Q breakfast   feeding supplement  1 Container Oral TID BM   ferrous sulfate  325 mg Oral Q breakfast   guaiFENesin  600 mg Oral BID   heparin injection (subcutaneous)  5,000 Units Subcutaneous Q8H   latanoprost  1 drop Both Eyes QPM   leptospermum manuka honey  1 Application Topical Daily   levothyroxine  50 mcg Oral q morning   midodrine  5 mg Oral TID with meals   mometasone-formoterol  2 puff Inhalation BID   Netarsudil Dimesylate  1 drop Ophthalmic QPM   nutrition supplement (JUVEN)  1 packet Oral BID BM   mouth rinse  15 mL Mouth Rinse 4 times per day   tamsulosin  0.4 mg Oral QHS   Continuous Infusions:  sodium chloride 5 mL/hr at 05/21/22 1847   albumin human     lactated ringers 50 mL/hr at 06/06/2022 0820   Diet Order             Diet NPO time specified  Diet effective now                   Intake/Output Summary (Last 24 hours) at 05/28/2022 0931 Last data filed at 05/23/2022 0740 Gross per 24 hour  Intake --  Output 1000 ml  Net -1000 ml    Net IO Since Admission: 3,149.38 mL [05/17/2022 0931]  Wt Readings from Last 3 Encounters:  05/24/22 61 kg  03/30/22 49.9 kg  02/17/22 45.8 kg     Unresulted Labs (From  admission, onward)     Start     Ordered   05/25/22 0500  Basic metabolic panel  Daily,   R     Question:  Specimen collection method  Answer:  Lab=Lab collect   05/24/22 1821   05/25/22 0500  CBC with Differential/Platelet  Daily,   R     Question:  Specimen collection method  Answer:  Lab=Lab collect   05/24/22 1821   05/24/22 0500  Magnesium  Daily,   R     Question:  Specimen collection method  Answer:  IV Team=IV Team collect   05/23/22 1421   05/24/22 0500  Phosphorus  Daily,   R     Question:  Specimen collection method  Answer:  IV Team=IV Team collect   05/23/22 1421  Data Reviewed: I have personally reviewed following labs and imaging studies CBC: Recent Labs  Lab 05/22/22 0827 05/23/22 1045 05/24/22 0144 05/25/22 0123 06/10/2022 0543  WBC 63.4* 72.8* 58.6* 56.6* 79.2*  NEUTROABS 60.2*  --   --  40.7* 73.7*  HGB 11.3* 11.6* 11.1* 9.8* 9.8*  HCT 38.3 39.4 36.8 32.7* 32.5*  MCV 95.8 97.5 94.6 95.1 95.0  PLT 306 294 245 199 240    Basic Metabolic Panel: Recent Labs  Lab 05/21/22 0102 05/23/22 1045 05/24/22 0144 05/25/22 0123 06/09/2022 0543  NA 134* 134* 134* 132* 132*  K 3.9 3.7 3.5 4.0 4.2  CL 105 107 106 105 104  CO2 18* 15* 18* 17* 19*  GLUCOSE 113* 164* 122* 112* 108*  BUN 37* 30* 30* 33* 44*  CREATININE 1.73* 1.38* 1.33* 1.33* 1.43*  CALCIUM 8.2* 8.1* 8.1* 8.2* 8.9  MG  --   --  1.4* 1.8 1.8  PHOS  --   --  3.4 3.6 4.2   GFR: Estimated Creatinine Clearance: 21.8 mL/min (A) (by C-G formula based on SCr of 1.43 mg/dL (H)). Liver Function Tests: Recent Labs  Lab 05/22/22 0419  AST 11*  ALT 11  ALKPHOS 69  BILITOT 0.5  PROT 4.5*  ALBUMIN 2.1*    No results found for this or any previous visit (from the past 240 hour(s)).  Antimicrobials: Anti-infectives (From admission, onward)    Start     Dose/Rate Route Frequency Ordered Stop   05/22/22 0900  piperacillin-tazobactam (ZOSYN) IVPB 2.25 g  Status:  Discontinued        2.25  g 100 mL/hr over 30 Minutes Intravenous Every 8 hours 05/22/22 0814 05/23/22 0922   05/20/22 1015  fluconazole (DIFLUCAN) tablet 150 mg        150 mg Oral  Once 05/20/22 0915 05/20/22 1201   05/15/22 1900  ceFEPIme (MAXIPIME) 2 g in sodium chloride 0.9 % 100 mL IVPB  Status:  Discontinued        2 g 200 mL/hr over 30 Minutes Intravenous Every 24 hours 05/11/2022 1819 05/15/22 0109   05/15/22 0600  piperacillin-tazobactam (ZOSYN) IVPB 2.25 g        2.25 g 100 mL/hr over 30 Minutes Intravenous Every 8 hours 05/15/22 0302 05/21/22 2250   05/28/2022 1630  ceFEPIme (MAXIPIME) 2 g in sodium chloride 0.9 % 100 mL IVPB        2 g 200 mL/hr over 30 Minutes Intravenous  Once 05/27/2022 1621 05/28/2022 1856   06/08/2022 1630  metroNIDAZOLE (FLAGYL) IVPB 500 mg        500 mg 100 mL/hr over 60 Minutes Intravenous  Once 05/11/2022 1621 05/18/2022 1856      Culture/Microbiology    Component Value Date/Time   SDES BLOOD LEFT HAND 05/27/2022 1725   SPECREQUEST  05/20/2022 1725    BOTTLES DRAWN AEROBIC AND ANAEROBIC Blood Culture adequate volume   CULT  05/22/2022 1725    NO GROWTH 5 DAYS Performed at [facility], [address] [place], [place] [address]    REPTSTATUS 05/19/2022 FINAL 05/25/2022 1725    Radiology Studies: DG Abd Portable 1V  Result Date: 05/25/2022 CLINICAL DATA:  Encounter for NG tube placement. EXAM: PORTABLE ABDOMEN - 1 VIEW COMPARISON:  Abdominal radiographs 05/24/2022 FINDINGS: An enteric tube has been placed and terminates in the expected region of the distal stomach. Skin staples are noted in the midline of the lower abdomen/pelvis. Increased density diffusely in the abdomen with centralized bowel  loops corresponds to known ascites. There is increased, mild dilatation of multiple gas-filled small bowel loops in the central abdomen and pelvis. Scattered gas is present in the colon. No acute osseous abnormality is seen. IMPRESSION: 1. Enteric tube terminates over the distal stomach.  2. Increased, mild small bowel dilatation which may reflect ileus or partial obstruction. 3. Ascites. Electronically Signed   By: Sebastian Ache M.D.   On: 05/25/2022 14:19   DG Abd Portable 1V  Result Date: 05/24/2022 CLINICAL DATA:  Abdominal distention EXAM: PORTABLE ABDOMEN - 1 VIEW COMPARISON:  Previous studies including CT done on 05/21/2022 FINDINGS: Bowel gas pattern is nonspecific. Skin staples are seen in lower abdomen. There is diffuse haziness in the abdomen suggesting moderate to large ascites. Part of the increased density in left upper and left mid abdomen may be due to enlarged spleen. IMPRESSION: Moderate to large ascites. Enlarged spleen. Nonspecific bowel gas pattern. Electronically Signed   By: Ernie Avena M.D.   On: 05/24/2022 15:25     LOS: 11 days   Lanae Boast, MD [facility]  05/15/2022, 9:31 AM

## 2022-06-11 NOTE — Progress Notes (Signed)
   05/29/2022 1100  Assess: MEWS Score  Temp (!) 97.5 F (36.4 C)  BP (!) 95/42  MAP (mmHg) (!) 59  Pulse Rate 93  ECG Heart Rate 94  Resp (!) 22  Level of Consciousness Alert  SpO2 96 %  O2 Device HFNC  O2 Flow Rate (L/min) 9 L/min  Assess: MEWS Score  MEWS Temp 0  MEWS Systolic 1  MEWS Pulse 0  MEWS RR 1  MEWS LOC 0  MEWS Score 2  MEWS Score Color Yellow  Assess: if the MEWS score is Yellow or Red  Were vital signs taken at a resting state? Yes  Focused Assessment No change from prior assessment  Does the patient meet 2 or more of the SIRS criteria? No  Notify: Charge Nurse/RN  Name of Charge Nurse/RN Notified Angie  Assess: SIRS CRITERIA  SIRS Temperature  0  SIRS Pulse 1  SIRS Respirations  1  SIRS WBC 1  SIRS Score Sum  3

## 2022-06-11 NOTE — Progress Notes (Signed)
   05/13/2022 1100  Vitals  Temp (!) 97.5 F (36.4 C)  Temp Source Oral  BP (!) 95/42  MAP (mmHg) (!) 59  BP Location Left Arm  BP Method Automatic  Patient Position (if appropriate) Lying  Pulse Rate 93  Pulse Rate Source Monitor  ECG Heart Rate 94  Resp (!) 22  Level of Consciousness  Level of Consciousness Alert  MEWS COLOR  MEWS Score Color Yellow  Oxygen Therapy  SpO2 96 %  O2 Device HFNC  O2 Flow Rate (L/min) 9 L/min  Pain Assessment  Pain Scale Faces  Pain Score 0  PCA/Epidural/Spinal Assessment  Respiratory Pattern Regular;Unlabored  MEWS Score  MEWS Temp 0  MEWS Systolic 1  MEWS Pulse 0  MEWS RR 1  MEWS LOC 0  MEWS Score 2

## 2022-06-11 NOTE — Progress Notes (Signed)
NAME:  Avarae Lengerich, MRN:  [id number], DOB:  1933-12-07, LOS: 11 ADMISSION DATE:  05/15/2022, CONSULTATION DATE:  05/21/2022 REFERRING MD:  Dr. Bedelia Person, CHIEF COMPLAINT:  Hypotension    History of Present Illness:  Dwala Hamre is a 89yo with a past medical history significant for bronchiectasis, thrombocytosis chronic venous insufficiency, GERD  who presented to the emergency department 5/4 for complaints of abdominal pain with distention and constipation.  Workup on an admission including CT abdomen pelvis suspicious for perforated sigmoid colon, general surgery was consulted and decision in collaboration with family was to proceed with ex lap.  Ex lap positive for perforated sigmoid diverticulitis with phlegmon  Per chart review it appears Hospital course initially uncomplicated but patient eventually developed ileus resulting in extended hospital stay.  Late evening of 5/11 PCCM was consulted to assist in medical management in the setting of hypotension and questionable need for vasopressor support. Given IV fluids and BP stabile. CT abd/pelvis showing moderate volume ascites w/ hpeatic cirrhosis; small b/l pleural effusions.  On 5/16 PCCM re-consulted for hypotension despite albumin. Also on NRB w/ ascites and pleural effusion. Palliative care consulted.  Pertinent  Medical History  bronchiectasis, thrombocytosis chronic venous insufficiency, GERD   Significant Hospital Events: Including procedures, antibiotic start and stop dates in addition to other pertinent events   5/4 presented for acute abdominal pain 5/5 underwent ex lap with and ostomy creation due to perforated sigmoid diverticulitis 5/7 urine culture positive for Proteus mirabillis 5/11 high output from ostomy with resultant hypotension, PCCM consulted for possible vasopressor need.  Interim History / Subjective:  Patient acute ill Daughter in law at bedside On NRB  Bp 95/42  Objective   Blood pressure (!) 95/42, pulse 93,  temperature (!) 97.5 F (36.4 C), temperature source Oral, resp. rate (!) 22, height 4\' 10"  (1.473 m), weight 61 kg, SpO2 96 %.        Intake/Output Summary (Last 24 hours) at 05/24/2022 1204 Last data filed at 05/22/2022 1102 Gross per 24 hour  Intake 349.82 ml  Output 1400 ml  Net -1050.18 ml    Filed Weights   05/22/22 0300 05/23/22 0500 05/24/22 0400  Weight: 60 kg 59 kg 61 kg    Examination: General:  critically ill appearing on nrb HEENT: MM pink/moist; nrb in place Neuro: lethargic but arousable CV: s1s2, RRR, no m/r/g PULM:  dim clear BS bilaterally; on nrb GI: soft, bsx4 active; distended w/ ascites Extremities: warm/dry, no edema  Skin: no rashes or lesions   Resolved Hospital Problem list     Assessment & Plan:  GOC Perforated sigmoid diverticulitis now s/p exploratory laparotomy with end colostomy Hypotension likely secondary to intravascular depletion and hypoalbuminemia Possible aspiration Acute respiratory failure w/ hypoxia Possible UTI Acute on chronic anemia Hx of essential thrombocytosis Sacral decubitus pressure ulcer Humeral fracture P: -Palliative care and Dr. Chestine Spore spoke with daughter in law at bedside who is aware that patient is severely ill and aware that patient will likely not survive this admission. Patient is DNR. Daughter in law states her family is coming from [place] and wanted to wait on transitioning to comfort initially until they had arrived, but given patient worsening hemodynamic status she does not want to place patient on pressors and does not want patient to suffer. She is okay to continue other supportive care for now but if patient is showing signs of pain would likely need to reach back out to family and consider transitioning to comfort care -continue  current supportive care with no escalation in care until family arrives from [place] -palliative care following  Best Practice (right click and "Reselect all SmartList Selections"  daily)  Per primary   Labs   CBC: Recent Labs  Lab 05/22/22 0827 05/23/22 1045 05/24/22 0144 05/25/22 0123 06/08/2022 0543  WBC 63.4* 72.8* 58.6* 56.6* 79.2*  NEUTROABS 60.2*  --   --  40.7* 73.7*  HGB 11.3* 11.6* 11.1* 9.8* 9.8*  HCT 38.3 39.4 36.8 32.7* 32.5*  MCV 95.8 97.5 94.6 95.1 95.0  PLT 306 294 245 199 240     Basic Metabolic Panel: Recent Labs  Lab 05/21/22 0102 05/23/22 1045 05/24/22 0144 05/25/22 0123 05/30/2022 0543  NA 134* 134* 134* 132* 132*  K 3.9 3.7 3.5 4.0 4.2  CL 105 107 106 105 104  CO2 18* 15* 18* 17* 19*  GLUCOSE 113* 164* 122* 112* 108*  BUN 37* 30* 30* 33* 44*  CREATININE 1.73* 1.38* 1.33* 1.33* 1.43*  CALCIUM 8.2* 8.1* 8.1* 8.2* 8.9  MG  --   --  1.4* 1.8 1.8  PHOS  --   --  3.4 3.6 4.2    GFR: Estimated Creatinine Clearance: 21.8 mL/min (A) (by C-G formula based on SCr of 1.43 mg/dL (H)). Recent Labs  Lab 05/23/22 1045 05/24/22 0144 05/25/22 0123 05/23/2022 0543  WBC 72.8* 58.6* 56.6* 79.2*     Liver Function Tests: Recent Labs  Lab 05/22/22 0419  AST 11*  ALT 11  ALKPHOS 69  BILITOT 0.5  PROT 4.5*  ALBUMIN 2.1*    No results for input(s): "LIPASE", "AMYLASE" in the last 168 hours. No results for input(s): "AMMONIA" in the last 168 hours.  ABG No results found for: "PHART", "PCO2ART", "PO2ART", "HCO3", "TCO2", "ACIDBASEDEF", "O2SAT"   Coagulation Profile: No results for input(s): "INR", "PROTIME" in the last 168 hours.   Cardiac Enzymes: No results for input(s): "CKTOTAL", "CKMB", "CKMBINDEX", "TROPONINI" in the last 168 hours.  HbA1C: No results found for: "HGBA1C"  CBG: No results for input(s): "GLUCAP" in the last 168 hours.  Review of Systems:   Please see the history of present illness. All other systems reviewed and are negative   Past Medical History:  She,  has a past medical history of Bronchiectasis (HCC), Cellulitis of right foot, Colon polyps, Complication of anesthesia, Diarrhea, Dry eye  (11/25/2020), Endometriosis, Essential tremor, GERD (gastroesophageal reflux disease), Glaucoma, Low back pain, Migraine, Osteopenia, Seasonal allergies, Thrombocytosis (09/2013), Tick bites, UPJ (ureteropelvic junction) obstruction (11/25/2020), Urinary frequency, Wears dentures, and Wears glasses.   Surgical History:   Past Surgical History:  Procedure Laterality Date   BOWEL RESECTION  05/14/2022   Procedure: LOW ANTERIOR BOWEL RESECTION;  Surgeon: Andria Meuse, MD;  Location: [facility];  Service: General;;   CATARACT EXTRACTION Left 02/07/2013   right 2015   colonscopy     10 or 15 yrs ago per pt on 11-25-2020   CYSTOSCOPY W/ URETERAL STENT PLACEMENT Left 11/26/2020   Procedure: CYSTOSCOPY WITH BILATERAL RETROGRADE PYELOGRAM/URETERAL STENT PLACEMENT;  Surgeon: Jerilee Field, MD;  Location: [facility];  Service: Urology;  Laterality: Left;  ONLY NEEDS 30 MIN   CYSTOSCOPY WITH URETEROSCOPY AND STENT PLACEMENT Left 01/12/2021   Procedure: CYSTOSCOPY WITH URETEROSCOPY AND STENT EXCHANGE;  Surgeon: Jerilee Field, MD;  Location: [facility];  Service: Urology;  Laterality: Left;   INGUINAL HERNIA REPAIR Left 01/09/2020   Procedure: LEFT INGUINAL HERNIA REPAIR WITH MESH;  Surgeon: Harriette Bouillon, MD;  Location: [facility];  Service: General;  Laterality: Left;   INSERTION OF MESH Left 01/09/2020   Procedure: INSERTION OF MESH;  Surgeon: Harriette Bouillon, MD;  Location: [facility];  Service: General;  Laterality: Left;   LAPAROTOMY N/A 05/25/2022   Procedure: EXPLORATORY LAPAROTOMY, low anterior resection with end colostomy;  Surgeon: Andria Meuse, MD;  Location: [facility];  Service: General;  Laterality: N/A;   TOTAL ABDOMINAL HYSTERECTOMY  09/10/1968   TUBAL LIGATION     yrs ago     Social History:   reports that she quit smoking about 60 years ago. Her smoking use included cigarettes. She has a 8.00 pack-year smoking  history. She has never used smokeless tobacco. She reports that she does not currently use alcohol. She reports that she does not use drugs.   Family History:  Her family history includes Cancer in her father; Cancer (age of onset: 47) in her maternal aunt; Thyroid disease in her mother.   Allergies Allergies  Allergen Reactions   Nystatin     Swelling of lips   Bee Pollen Other (See Comments) and Cough    Mill dust, pollen. Mill dust, pollen.   Pollen Extract Cough    Mill dust, pollen.   Albuterol     Increased HR, had to go to the ER   Doxycycline Nausea And Vomiting   Mangifera Indica Fruit Ext (Mango) [Mangifera Indica] Diarrhea   Mite (D. Farinae) Other (See Comments)    General allergy   Sulfamethoxazole Other (See Comments)    Irritation in only eye drops Other Other     Home Medications  Prior to Admission medications   Medication Sig Start Date End Date Taking? Authorizing Provider  acetaminophen (TYLENOL) 500 MG tablet Take 500 mg by mouth every 6 (six) hours as needed for moderate pain.   Yes [provider]  aspirin EC 81 MG tablet Take 81 mg by mouth daily. Swallow whole.   Yes [provider]  brimonidine (ALPHAGAN) 0.2 % ophthalmic solution Place 1 drop into both eyes 2 (two) times daily. 08/17/20  Yes [provider]  calcium carbonate (OS-CAL) 1250 (500 Ca) MG chewable tablet Chew 1 tablet by mouth daily.   Yes [provider]  Docusate Sodium (DSS) 100 MG CAPS Take 1 capsule by mouth daily.   Yes [provider]  ferrous sulfate 325 (65 FE) MG EC tablet Take 1 tablet (325 mg total) by mouth daily with breakfast. 04/04/22 04/04/23 Yes Jerald Kief, MD  fluticasone-salmeterol (ADVAIR DISKUS) 100-50 MCG/ACT AEPB Inhale 1 puff into the lungs 2 (two) times daily. 02/15/22  Yes Luciano Cutter, MD  Latanoprostene Bunod (VYZULTA) 0.024 % SOLN Apply 1 drop to eye every evening. Both eyes   Yes [provider]   levothyroxine (SYNTHROID) 50 MCG tablet Take 50 mcg by mouth every morning.   Yes [provider]  Netarsudil Dimesylate (RHOPRESSA) 0.02 % SOLN Apply 1 drop to eye every evening. Both eyes   Yes [provider]  oxyCODONE (OXY IR/ROXICODONE) 5 MG immediate release tablet Take 1 tablet (5 mg total) by mouth every 6 (six) hours as needed for moderate pain or breakthrough pain. 04/01/22  Yes Jerald Kief, MD  Polyethyl Glycol-Propyl Glycol (SYSTANE OP) Place 1 drop into both eyes daily as needed (dry eyes). Uses eye drops prn for dry eye   Yes [provider]  tamsulosin (FLOMAX) 0.4 MG CAPS capsule Take 0.4 mg by mouth at  bedtime. 02/08/22  Yes [provider]  feeding supplement, GLUCERNA SHAKE, (GLUCERNA SHAKE) LIQD Take 237 mLs by mouth 2 (two) times daily between meals.    [provider]  polyethylene glycol (MIRALAX / GLYCOLAX) 17 g packet Take 17 g by mouth daily. Hold for diarrhea Patient not taking: Reported on 06/02/2022 04/04/22   Jerald Kief, MD     Critical care time:  NA   JD Anselm Lis [facility] 05/19/2022, 12:34 PM  Please see [email or web address] for pager details.  From 7A-7P if no response, please call [phone]. After hours, please call ELink [phone].

## 2022-06-11 NOTE — Death Summary Note (Addendum)
DEATH SUMMARY   Patient Details  Name: Chelsea Garcia MRN: [id number] DOB: June 23, 1933 WUJ:[id number], [name], MD Admission/Discharge Information   Admit Date:  Jun 04, 2022  Date of Death: Date of Death: 06/16/2022  Time of Death: Time of Death: 1455-07-03  Length of Stay: 2022-07-12   Principle Cause of death: FAILURE TO THRIVE , RESP FAILURE 2/2 PERFORATED VISCUS Hospital Diagnoses: Principal Problem:   Perforation of sigmoid colon due to diverticulitis Active Problems:   S/P partial colectomy   Right humeral fracture   Essential thrombocytosis (HCC)   Normocytic anemia   Pressure injury of skin   Hypotension   UTI (urinary tract infection)   Leukocytosis   Sacral decubitus ulcer, stage III (HCC)   Protein-calorie malnutrition, severe   Goals of care, counseling/discussion   Other ascites   Acute respiratory failure with hypoxia (HCC)   Anasarca   End of life care   FTT (failure to thrive) in adult   Hospital Course: 89 y.o. female with Chelsea Garcia with elevated WBC and platelet counts (Chelsea Garcia positive) on hydroxyurea since Jul 02, 2013, anemia chronic disease, bronchiectasis who presented to ER on 2022/06/04 with abdominal pain. CT abd/pelvis showed distal perforated diverticulitis.  General surgery evaluated and she was taken for ex-lap with LAR and end colostomy. She had complications of hypotension transiently and was managed by critical care as well.  Patient has prolonged hospitalization complicated by ileus.  ManageD W/ antibiotic postop course complicated with  ileus, ascites anasarcar w. Heaptic cirrhosis severe malnutrition FTT, hypotension, leucocytosis PMT was consulted. Changed to Chelsea Garcia 5/15-. Pateitn had further decline with soft bp, respiratory failure/resp distress. Chelsea Garcia consulted 06/16/2022- no escalation of care. DIL son HCPOA at bedside with ongoing change in clinical status- transitioned to comfort care and she passed away with family at bedside  End-of-life care/comfort measures: Patient was  transition to comfort measures and subsequently passed away peacefully with family at bedside  Perforated sigmoid diverticulitis Postop ileus Blood in colostomy bag 06-16-22 Patient is a S/P EX LAP with LAR- end colostomy on 05/15/22.Initially received cefepime and if Flagyl then on Zosyn 5/5> 5/13,S/P fluconazole 5/10 x 1. Surgery following closely for ongoing ileus> NG tube) started 5/15>  continue current wound care colostomy.  Continue gentle IV fluid hydration.  Holding off on TPN until family meeting.  Patient is lethargic frail and doing poorly with ongoing ileus, ascites.  Encourage PT OT as tolerated along with pulmonary toileting. Change meds to NG tube/ and IV.  RN noticed blood in colostomy bag -held heparin   Hepatic cirrhosis w/ splenomegaly and dilated portal vein w/ portal venous hypertension Moderate to Large volume ascites Extensive body wall mesenteric and retroperitoneal edema-anasarca Small Bilateral pleural effusions on 5/11 CT: in the setting of hepatic cirrhosis hypoalbuminemia,poor nutritional status   Hypovolemic shock Soft blood pressure/hypotension: In the setting of hypoalbuminemia, malnutrition, high output ileostomy-seen by Chelsea Garcia initially responded to IV fluids. BP remains soft, continue midodrine, albumin. ivf held 5/14 but W/ ongoing ileus, npo and AKI- added gentle ivf, and on iv albumin q6hr   Acute hypoxic respiratory failure: worsening and needing HFNC at 9 L/NRB due to ascites and abdomen distension.  Acute kidney injury Metabolic acidosis Hyponatremia Likely aspiration pneumonitis in the setting of postop ileus/ascites  Failure to thrive in the setting of severe malnutrition postop ileus  Sleepy/lethargy concern for acute metabolic encephalopathy in the setting of prolonged hospitalization postop ileus malnutrition. Patient remains at high risk for decompensation.    Right proximal humeral fracture,initially diagnosed 03/30/22.  Hhas been seen by ortho  previously and discussed with again during this admission; non-op management> recommended to continue NWB to RUE, no resistance exercises to RUE (the shoulder specifically) but has passive and active assistive motion of the shoulder.No restrictions from her elbow, forearm, wrist or hand. CT repeated and stable: "Displaced fracture of the right proximal humerus with interval resorption of fracture margins, early periosteal reaction and callus formation but no bony bridging" fu w/ Chelsea Garcia   Possible UTI: With Proteus treated with Zosyn. See #1   MPN/ Long standing ET w/ JAK+ with anemia, leukocytosis- probably myelofibrosis F/b Chelsea Garcia, last see in 04/20/22.  On hydroxyurea as outpatient.  Has worsening wbc counts in the setting of #1/acute illness Sacral decubitus ulcer stage II Severe malnutrition Abnormal CT scan 5/11 Multiple large stones in both kidneys with mild right hydronephrosis persistent, no left-sided hydronephrosis. Markedly distended gallbladder without calcified stone disease evident  Severe Sepsis 2/2 perforated viscus and UTI; with lactic acidosis, AKI, leucocytosis all these present on admission.       Procedures: SEE ABOVE  Consultations: PMT, Chelsea Garcia, CCS,  The results of significant diagnostics from this hospitalization (including imaging, microbiology, ancillary and laboratory) are listed below for reference.   Significant Diagnostic Studies: DG Abd Portable 1V  Result Date: 05/25/2022 CLINICAL DATA:  Encounter for NG tube placement. EXAM: PORTABLE ABDOMEN - 1 VIEW COMPARISON:  Abdominal radiographs 05/24/2022 FINDINGS: An enteric tube has been placed and terminates in the expected region of the distal stomach. Skin staples are noted in the midline of the lower abdomen/pelvis. Increased density diffusely in the abdomen with centralized bowel loops corresponds to known ascites. There is increased, mild dilatation of multiple gas-filled small bowel loops in the central  abdomen and pelvis. Scattered gas is present in the colon. No acute osseous abnormality is seen. IMPRESSION: 1. Enteric tube terminates over the distal stomach. 2. Increased, mild small bowel dilatation which may reflect ileus or partial obstruction. 3. Ascites. Electronically Signed   By: Sebastian Ache M.D.   On: 05/25/2022 14:19   DG Abd Portable 1V  Result Date: 05/24/2022 CLINICAL DATA:  Abdominal distention EXAM: PORTABLE ABDOMEN - 1 VIEW COMPARISON:  Previous studies including CT done on 05/21/2022 FINDINGS: Bowel gas pattern is nonspecific. Skin staples are seen in lower abdomen. There is diffuse haziness in the abdomen suggesting moderate to large ascites. Part of the increased density in left upper and left mid abdomen may be due to enlarged spleen. IMPRESSION: Moderate to large ascites. Enlarged spleen. Nonspecific bowel gas pattern. Electronically Signed   By: Ernie Avena M.D.   On: 05/24/2022 15:25   DG CHEST PORT 1 VIEW  Result Date: 05/23/2022 CLINICAL DATA:  Wheezing. Difficulty breathing. Diminished oxygen saturation. EXAM: PORTABLE CHEST 1 VIEW COMPARISON:  05/21/2022 FINDINGS: Low lung volumes. Heart is partially obscured by opacities in the lung bases. There are bilateral pleural effusions and bibasilar atelectasis or consolidation. Prominence of interstitial markings is also present. Findings are consistent with edema or infectious process and pleural effusions. IMPRESSION: 1. Bilateral pleural effusions and bibasilar atelectasis or consolidation. 2. Prominence of interstitial markings consistent with edema or infectious process. Electronically Signed   By: Norva Pavlov M.D.   On: 05/23/2022 13:34   CT ABDOMEN PELVIS WO CONTRAST  Result Date: 05/21/2022 CLINICAL DATA:  Postop day 6 from exploratory laparotomy with low anterior section and end colostomy for perforated sigmoid diverticulitis. EXAM: CT ABDOMEN AND PELVIS WITHOUT CONTRAST TECHNIQUE: Multidetector CT imaging of  the abdomen and pelvis was performed following the standard protocol without IV contrast. RADIATION DOSE REDUCTION: This exam was performed according to the departmental dose-optimization program which includes automated exposure control, adjustment of the mA and/or kV according to patient size and/or use of iterative reconstruction technique. COMPARISON:  05/11/2022 FINDINGS: Lower chest: Bronchiectasis with areas of subsegmental volume loss again noted both lung bases, suggesting scarring. Small bilateral pleural effusions are mildly progressive in the interval. Hepatobiliary: No suspicious focal abnormality in the liver on this study without intravenous contrast. Nodular hepatic contour suggests cirrhosis. Periportal edema evident. Markedly prominent portal vein, as before. Gallbladder is markedly distended without calcified stone disease evident. No substantial intrahepatic biliary duct dilatation. Extrahepatic common duct appears nondilated. Pancreas: No focal mass lesion. No dilatation of the main duct. No intraparenchymal cyst. No peripancreatic edema. Spleen: Spleen remains markedly enlarged. Adrenals/Urinary Tract: Left adrenal gland unremarkable. Right adrenal gland not well seen. Multiple large stones are again noted in both kidneys, as characterized on the study from 1 week ago. There is persistent similar mild right hydronephrosis. No left-sided hydronephrosis on today's study. There may be a tiny gas bubble in the anterior bladder lumen (axial 82/4) presumably secondary to recent instrumentation. Stomach/Bowel: Stomach is nondilated. No overt small bowel dilatation although fluid-filled small bowel loops in the left pelvis measure up to 2.7 cm diameter. Terminal ileum unremarkable. Colon is nondilated with left abdominal sigmoid end colostomy evident. Hartmann's pouch anatomy associated. Vascular/Lymphatic: There is mild atherosclerotic calcification of the abdominal aorta without aneurysm. There is no  gastrohepatic or hepatoduodenal ligament lymphadenopathy. No retroperitoneal or mesenteric lymphadenopathy. No pelvic sidewall lymphadenopathy. Reproductive: Hysterectomy.  There is no adnexal mass. Other: There is moderate volume ascites, mildly progressive in the interval with fluid in both para colic gutters and in the pelvis. There is dependent material of increased attenuation in the cul-de-sac fluid (see axial 77/4) which could be related to blood products or infectious debris. Extensive body wall, mesenteric, and retroperitoneal edema evident. Small to moderate volume pneumoperitoneum under the anterior abdominal wall is not entirely unexpected on postoperative day 6. Musculoskeletal: No worrisome lytic or sclerotic osseous abnormality. IMPRESSION: 1. Small to moderate volume pneumoperitoneum under the anterior abdominal wall is not entirely unexpected on postoperative day 6. 2. Moderate volume ascites, mildly progressive in the interval with fluid in both para colic gutters and in the pelvis. There is dependent material of increased attenuation in the cul-de-sac fluid which could be related to blood products or infectious debris. 3. Extensive body wall, mesenteric, and retroperitoneal edema. 4. Small bilateral pleural effusions are mildly progressive in the interval. 5. Multiple large stones in both kidneys, as characterized on the study from 1 week ago. There is persistent similar mild right hydronephrosis. No left-sided hydronephrosis on today's study. 6. Hepatic cirrhosis with marked splenomegaly and dilated portal vein, as before and compatible with portal venous hypertension. 7. Markedly distended gallbladder without calcified stone disease evident. 8. Surgical changes consistent with low anterior resection. Left abdominal sigmoid end colostomy with Hartmann's pouch anatomy associated. 9.  Aortic Atherosclerosis (ICD10-I70.0). Electronically Signed   By: Kennith Center M.D.   On: 05/21/2022 13:01   DG  CHEST PORT 1 VIEW  Result Date: 05/21/2022 CLINICAL DATA:  Shortness of breath. EXAM: PORTABLE CHEST 1 VIEW COMPARISON:  05/22/2022 FINDINGS: Low volume film. There is bibasilar collapse/consolidation with small bilateral pleural effusions. The cardio pericardial silhouette is enlarged. Degenerative changes again noted right shoulder with posttraumatic deformity in the right humeral neck.  Telemetry leads overlie the chest. IMPRESSION: Low volume film with similar bibasilar right greater than left collapse/consolidation and small bilateral pleural effusions. Electronically Signed   By: Kennith Center M.D.   On: 05/21/2022 11:50   DG Abd Portable 1V  Result Date: 05/20/2022 CLINICAL DATA:  Abdominal distension. EXAM: PORTABLE ABDOMEN - 1 VIEW COMPARISON:  May 15, 2022. FINDINGS: No abnormal bowel dilatation is noted. Midline surgical staples are noted in the pelvis. No abnormal calcifications are noted. IMPRESSION: No abnormal bowel dilatation. Electronically Signed   By: Lupita Raider M.D.   On: 05/20/2022 08:29   CT HUMERUS RIGHT WO CONTRAST  Result Date: 05/18/2022 CLINICAL DATA:  Fracture, humerus EXAM: CT OF THE RIGHT HUMERUS WITHOUT CONTRAST TECHNIQUE: Multidetector CT imaging was performed according to the standard protocol. Multiplanar CT image reconstructions were also generated. RADIATION DOSE REDUCTION: This exam was performed according to the departmental dose-optimization program which includes automated exposure control, adjustment of the mA and/or kV according to patient size and/or use of iterative reconstruction technique. COMPARISON:  Right shoulder radiograph 03/30/2022, CT 03/30/2022 FINDINGS: Bones/Joint/Cartilage There is a displaced fracture through the surgical neck of the right proximal humerus with involvement of the lesser tuberosity. There is no greater tuberosity displacement. There is interval fracture margin resorption with some sclerotic margins developing. There is early  periosteal reaction and developing callus without any bridging solid bone. Normal alignment of the humeral head with the glenoid. There is fracture foreshortening. No humeral head split. Mild glenohumeral osteoarthritis. Ligaments Suboptimally assessed by CT. Muscles and Tendons Generalized loss of muscle bulk. Soft tissues Soft tissue swelling along the shoulder. IMPRESSION: Displaced fracture of the right proximal humerus with interval resorption of fracture margins, early periosteal reaction and callus formation but no bony bridging. Electronically Signed   By: Caprice Renshaw M.D.   On: 05/18/2022 16:52   DG Abd 1 View  Result Date: 05/15/2022 CLINICAL DATA:  [id number]. Encounter to confirm nasogastric tube placement. PACU patient. EXAM: ABDOMEN - 1 VIEW COMPARISON:  CT abdomen and pelvis yesterday at 7:33 p.m. FINDINGS: Interval laparotomy with overlying midline skin staples. There is free air in the pelvis and left mid abdomen consistent with the open procedure. No dilated bowel is seen. Bilateral nephrolithiasis seen on CT was better demonstrated on CT. Visceral shadows are stable. NGT has been inserted and terminates in the body of stomach. IMPRESSION: 1. NGT terminates in the body of the stomach. 2. Free air in the pelvis and left mid abdomen consistent with the recent open procedure. Electronically Signed   By: Almira Bar M.D.   On: 05/15/2022 01:53   CT ABDOMEN PELVIS W CONTRAST  Result Date: 05-18-22 CLINICAL DATA:  Abdominal pain EXAM: CT ABDOMEN AND PELVIS WITH CONTRAST TECHNIQUE: Multidetector CT imaging of the abdomen and pelvis was performed using the standard protocol following bolus administration of intravenous contrast. RADIATION DOSE REDUCTION: This exam was performed according to the departmental dose-optimization program which includes automated exposure control, adjustment of the mA and/or kV according to patient size and/or use of iterative reconstruction technique. CONTRAST:  50mL  OMNIPAQUE IOHEXOL 350 MG/ML SOLN COMPARISON:  08/13/2021 FINDINGS: Lower chest: There are small bilateral pleural effusions, with dependent lower lobe consolidation favoring atelectasis. Peripheral ground-glass consolidation in the right middle lobe could be hypoventilatory, inflammatory, or infectious. The heart is enlarged without pericardial effusion. Hepatobiliary: Hepatomegaly without focal liver abnormality. Gallbladder is unremarkable. Pancreas: Unremarkable. No pancreatic ductal dilatation or surrounding inflammatory changes. Spleen: Massive splenomegaly again noted,  spleen measuring greater than 22 cm in craniocaudal extent. No focal parenchymal abnormality. Adrenals/Urinary Tract: Bilateral renal calculi are again identified, measuring up to 18 mm on the right and 16 mm on the left. Bilateral extrarenal pelves are again identified. No evidence of hydronephrosis. The bladder is only minimally distended, limiting its evaluation. The adrenals are stable. Stomach/Bowel: No evidence of bowel obstruction or ileus. Diverticulosis of the colon, most pronounced distally. There is possible extraluminal gas and bowel contents within the right lower pelvis, reference images 76 through 82. There is diffuse wall thickening of the large and small bowel, nonspecific given the adjacent ascites. Vascular/Lymphatic: Portal venous hypertension manifested by enlargement of the portal vein, recanalization of the umbilical vein, upper abdominal varices, and massive splenomegaly. Atherosclerosis of the aorta and its branches. No pathologic adenopathy. Reproductive: Status post hysterectomy. No adnexal masses. Other: There is small volume ascites throughout the abdomen and pelvis. No free intraperitoneal gas. No abdominal wall hernia. Musculoskeletal: The patient is cachectic. Progressive superior endplate compression deformity at the T9 level, with less than 25% loss of height. No acute bony abnormality. Reconstructed images  demonstrate no additional findings. IMPRESSION: 1. Suspected extraluminal gas and bowel contents within the right lower pelvis, adjacent to the rectosigmoid colon. Evaluation is limited without oral contrast, but distal perforated diverticulitis is suspected. 2. Hepatosplenomegaly, with persistent findings of portal venous hypertension. 3. Diffuse ascites. 4. Nonspecific diffuse bowel wall thickening. While this could be due to adjacent ascites, secondary bowel wall thickening due to diffuse peritonitis should be considered given the above findings. 5. Bilateral nonobstructing renal calculi. 6. Small bilateral pleural effusions and bibasilar atelectasis, right greater than left. Nonspecific peripheral ground-glass consolidation within the non dependent right middle lobe, which could be hypoventilatory, inflammatory, or infectious. 7.  Aortic Atherosclerosis (ICD10-I70.0). Critical Value/emergent results were called by telephone at the time of interpretation on May 21, 2022 at 8:16 pm to provider [facility]: Lambert Rhonda W , who verbally acknowledged these results. Electronically Signed   By: Sharlet Salina M.D.   On: 21-May-2022 20:16   DG Chest Port 1 View  Result Date: 21-May-2022 CLINICAL DATA:  Questionable sepsis, evaluate for abnormality. History of fractured right humerus for several weeks. EXAM: PORTABLE CHEST 1 VIEW COMPARISON:  CT examination dated March 30, 2022 FINDINGS: The heart is enlarged. Right basilar opacity suggesting atelectasis or infiltrate. Left lung is clear. Comminuted displaced fracture of the right humeral head and neck partially imaged. Thoracic spondylosis. IMPRESSION: 1. Right basilar opacity suggesting atelectasis or infiltrate. 2. Comminuted displaced fracture of the right humeral head and neck partially imaged. Electronically Signed   By: Larose Hires D.O.   On: 05/21/22 17:03    Microbiology: No results found for this or any previous visit (from the past 240 hour(s)).  Time spent: 0  minutes  Signed: Lanae Boast, MD 05/30/2022

## 2022-06-11 NOTE — Progress Notes (Signed)
Daily Progress Note   Patient Name: Chelsea Garcia       Date: 05/25/2022 DOB: 1934-01-06  Age: 89 y.o. MRN#: [id number] Attending Physician: Lanae Boast, MD Primary Care Physician: Darrow Bussing, MD Admit Date: 05/23/2022  Reason for Consultation/Follow-up: Establishing goals of care  Subjective: AM: Medical records reviewed including progress notes, labs, imaging. Patient assessed at the bedside.  She is unresponsive and tachypneic, on 9 L nasal cannula.  Her daughter-in-law/HCPOA Chelsea Garcia is present at the bedside.  Discussed with RN.  Created space and opportunity for Chelsea Garcia's thoughts and feelings on patient's current illness.  Reviewed her discussion with my colleague yesterday.  Chelsea Garcia feels strongly that patient's sons should be more involved in this decision even though she is HCPOA.  Chelsea Garcia is arriving around 10:30 PM tonight from [place].  She is interested in a family meeting with PMT tomorrow when both her sons are present to discuss options moving forward.  Emotional support and therapeutic listening was provided.  She agrees that it would be most appropriate to defer decisions such as TPN and to have no further escalation of care until tomorrow's meeting.  She understands that aggressive interventions would not benefit the overall trajectory or prognosis of the patient.  PM: Received update from RN that patient was unable to have paracentesis in IR due to worsening hypoxia, now requiring NRB.  Patient also has worsening hypotension and was given midodrine.  Met at the bedside with Chelsea Garcia and Dr. Jonathon Bellows, then joined by Dr. Chestine Spore of critical care.  We discussed that patient is dying and suffering.  Shared my concern that patient may not survive to see her son arrive this evening.  She called her husband Chelsea Garcia to  come to the hospital as soon as possible.   Recommended transition to full comfort measures upon [name]'s arrival.  Explained that patient would no longer receive aggressive medical interventions such as continuous vital signs, lab work, radiology testing, or medications not focused on comfort. All care would focus on how the patient is looking and feeling. This would include management of any symptoms that may cause discomfort, pain, shortness of breath, cough, nausea, agitation, anxiety, and/or secretions etc. Symptoms would be managed with medications and other non-pharmacological interventions such as spiritual support if requested, repositioning, music therapy, or therapeutic listening. Family verbalized understanding and  appreciation.  They communicated with patient's son Chelsea Garcia via text and he was agreeable as well.   Questions and concerns addressed. PMT will continue to support holistically.   Length of Stay: 11   Physical Exam Vitals reviewed.  Constitutional:      Appearance: She is ill-appearing.     Interventions: Nasal cannula in place.     Comments: 9L. NTG in place  Cardiovascular:     Rate and Rhythm: Normal rate.  Pulmonary:     Effort: Tachypnea and accessory muscle usage present.  Skin:    General: Skin is cool and dry.  Neurological:     Mental Status: She is unresponsive.             Vital Signs: BP (!) 94/45 (BP Location: Left Arm)   Pulse 93   Temp 97.6 F (36.4 C) (Axillary)   Resp 18   Ht 4\' 10"  (1.473 m)   Wt 61 kg   SpO2 95%   BMI 28.11 kg/m  SpO2: SpO2: 95 % O2 Device: O2 Device: High Flow Nasal Cannula (salter) O2 Flow Rate: O2 Flow Rate (L/min): 8 L/min      Palliative Assessment/Data: 10%   Palliative Care Assessment & Plan   Patient Profile: 89 y.o. female  with past medical history of essential thrombocytosis (JAK2 positive and on hydroxyurea since 2015), anemia of chronic disease, glaucoma, and bronchiectasis who presented to the ED on  05/25/2022 with abdominal pain.  CT abdomen/pelvis showed distal perforated diverticulitis.  She was evaluated by general surgery and taken for exploratory laparotomy with LAR and end colostomy on 05/15/2022.  She has been on a clear liquid diet but now with worsening abdominal distention and concern for worsening ileus.  Changed to NPO status and NG tube placed for decompression on 05/25/2022.   Palliative Medicine has been consulted for goals of care.  Assessment: Goals of care conversation Acute respiratory failure with hypoxia Sepsis due to diverticulitis, perforated, status post ex lap with end colostomy Failure to thrive  Recommendations/Plan: Continue DNR/DNI Transition to full comfort measures Dilaudid PRN for pain/air hunger/comfort Robinul PRN for excessive secretions Ativan PRN for agitation/anxiety Zofran PRN for nausea Liquifilm tears PRN for dry eyes Unrestricted visitations in the setting of EOL (per policy) Oxygen PRN 2L or less for comfort. No escalation.   Psychosocial and emotional support provided Spiritual care consult declined at this time PMT will continue to follow and support   Prognosis:  Hours - Days  Discharge Planning: Anticipated Hospital Death  Care plan was discussed with patient's family, RN, Dr. Jonathon Bellows, PCCM   Total time: I spent 85 minutes in the care of the patient today in the above activities and documenting the encounter.  MDM high         Folasade Mooty Jeni Salles, PA-C  Palliative Medicine Team Team phone # [phone]  Thank you for allowing the Palliative Medicine Team to assist in the care of this patient. Please utilize secure chat with additional questions, if there is no response within 30 minutes please call the above phone number.  Palliative Medicine Team providers are available by phone from 7am to 7pm daily and can be reached through the team cell phone.  Should this patient require assistance outside of these hours, please call the  patient's attending physician.

## 2022-06-11 NOTE — Progress Notes (Signed)
Trauma/Critical Care Follow Up Note  Subjective:    Overnight Issues:   Objective:  Vital signs for last 24 hours: Temp:  [97.5 F (36.4 C)-97.7 F (36.5 C)] 97.6 F (36.4 C) (05/16 0754) Pulse Rate:  [93-99] 93 (05/16 0335) Resp:  [16-18] 18 (05/16 0335) BP: (92-100)/(42-50) 94/45 (05/16 0754) SpO2:  [87 %-95 %] 95 % (05/16 0335)  Hemodynamic parameters for last 24 hours:    Intake/Output from previous day: 05/15 0701 - 05/16 0700 In: -  Out: 1150 [Urine:250; Stool:900]  Intake/Output this shift: Total I/O In: -  Out: 100 [Emesis/NG output:100]  Vent settings for last 24 hours:    Physical Exam:  Gen: comfortable, no distress Neuro: follows commands, alert, communicative HEENT: PERRL Neck: supple CV: RRR Pulm: unlabored breathing on [place] Abd: soft, NT , distended, ostmoy productive GU: urine clear and yellow, +spontaneous voids Extr: wwp, no edema  Results for orders placed or performed during the hospital encounter of 05/29/2022 (from the past 24 hour(s))  CBC with Differential/Platelet     Status: Abnormal   Collection Time: 05/20/2022  5:43 AM  Result Value Ref Range   WBC 79.2 (HH) 4.0 - 10.5 K/uL   RBC 3.42 (L) 3.87 - 5.11 MIL/uL   Hemoglobin 9.8 (L) 12.0 - 15.0 g/dL   HCT 81.1 (L) 91.4 - 78.2 %   MCV 95.0 80.0 - 100.0 fL   MCH 28.7 26.0 - 34.0 pg   MCHC 30.2 30.0 - 36.0 g/dL   RDW 95.6 (H) 21.3 - 08.6 %   Platelets 240 150 - 400 K/uL   nRBC 0.2 0.0 - 0.2 %   Neutrophils Relative % 93 %   Neutro Abs 73.7 (H) 1.7 - 7.7 K/uL   Lymphocytes Relative 4 %   Lymphs Abs 3.2 0.7 - 4.0 K/uL   Monocytes Relative 3 %   Monocytes Absolute 2.4 (H) 0.1 - 1.0 K/uL   Eosinophils Relative 0 %   Eosinophils Absolute 0.0 0.0 - 0.5 K/uL   Basophils Relative 0 %   Basophils Absolute 0.0 0.0 - 0.1 K/uL   WBC Morphology See Note    nRBC 0 0 /100 WBC   Abs Immature Granulocytes 0.00 0.00 - 0.07 K/uL   Polychromasia PRESENT   Basic metabolic panel     Status: Abnormal    Collection Time: 05/21/2022  5:43 AM  Result Value Ref Range   Sodium 132 (L) 135 - 145 mmol/L   Potassium 4.2 3.5 - 5.1 mmol/L   Chloride 104 98 - 111 mmol/L   CO2 19 (L) 22 - 32 mmol/L   Glucose, Bld 108 (H) 70 - 99 mg/dL   BUN 44 (H) 8 - 23 mg/dL   Creatinine, Ser 5.78 (H) 0.44 - 1.00 mg/dL   Calcium 8.9 8.9 - 46.9 mg/dL   GFR, Estimated 36 (L) >60 mL/min   Anion gap 9 5 - 15  Magnesium     Status: None   Collection Time: 05/22/2022  5:43 AM  Result Value Ref Range   Magnesium 1.8 1.7 - 2.4 mg/dL  Phosphorus     Status: None   Collection Time: 06/10/2022  5:43 AM  Result Value Ref Range   Phosphorus 4.2 2.5 - 4.6 mg/dL    Assessment & Plan:  Present on Admission:  Essential thrombocytosis (HCC)  Normocytic anemia    LOS: 11 days   Additional comments:I reviewed the patient's new clinical lab test results.   and I reviewed the patients new  imaging test results.    s/p Procedure(s): EXPLORATORY LAPAROTOMY, low anterior resection with end colostomy (N/A) LOW ANTERIOR BOWEL RESECTION Continue bowel rest. I think she may benefit from ng but pt refused Consider tpn for nutrition May need palliative consult. May need to intubated soon  POD# 11 - s/p ex lap, LAR, end colostomy for perforated sigmoid diverticulitis - Dr. Cliffton Asters on 05/15/2022 - Path neg for malignancy  - CT 5/11 w/ ascites, hepatic cirrhosis, post op changes - no organized abscess.  - npo. NGT low output - Completed post op abx. Monitor wbc and fever curve. WBC 79 this AM, but remains AF. Consider repeat CT A/P if full scope of care desired, but I am doubtful WBC is secondary to infectious process. - WOC RN consult for new ostomy - Monitor wound - some dried drainage, no cellulitis, staples in place  - Pulm toilet (IS, add FV) - PT/OT. Plan SNF -concern for worsening ileus, backed down to clears over the weekend Persistent distension today, but low NGT o/p. I suspect this distention is reaccumulation of ascites and  may benefit from paracentesis if full scope of care is desired.     FEN: npo. Needs IVF. TRH following to assist w/ BP. XR abd today VTE: SCD's, SQH q 8h ID: cefepime/flagyl 5/4. Zosyn 5/5 >> completed 7 days post-op. Afebrile. WBC elevated. Will ask hematology if she could have CLL Foley: Spontaneous voids.  Dispo: SDU    UTI - Ucx from 5/4 w/ proteus mirabilis and E. Coli. Sens to zosyn. Completed course of abx . Acute on chronic anemia - Hgb stable at 11.3 Yeast inf - fluconazole x 1.  R humerus fx - 3/21. Per notes, Dr. Carola Frost planned non-op management with sling and NWB. Reached out to ortho to let her know she was here and get updated recs. RUE NWB and no resistance exercises to RUE (the shoulder specifically) but has passive and active assistive motion of the shoulder. no restrictions from her elbow, forearm, wrist or hand. CT done while here. Discussed with their team. Cont current care. OT Hepatosplenomegaly - cirrhosis with ascites CKD - Cr 1.3 GERD Endometriosis Thrombocytosis (follows with Dr. Mosetta Putt) - Plt 306 Sacral decubitus ulcer/pressure injuries - WOCN consult.  Diamantina Monks, MD Trauma & General Surgery Please use [email or web address] to contact on call provider  05/29/2022  *Care during the described time interval was provided by me. I have reviewed this patient's available data, including medical history, events of note, physical examination and test results as part of my evaluation.

## 2022-06-11 NOTE — Progress Notes (Signed)
SLP Cancellation Note  Patient Details Name: Chelsea Garcia MRN: [id number] DOB: 25-Aug-1933   Cancelled treatment:        Pt remains NPO with NG suction and is not appropriate for PO trials at this time.  Spoke briefly with RN.  SLP will continue to follow for medical readiness for reassessment.   Kerrie Pleasure, MA, CCC-SLP Acute Rehabilitation Services Office: [phone] 05/17/2022, 10:09 AM

## 2022-06-11 NOTE — Progress Notes (Signed)
Patient transitioned to full comfort care. NG tube removed. Remains on NRB at this time. Son and daughter-in-law at bedside. Patient appears to be resting comfortably, no distress noted at this time.

## 2022-06-11 NOTE — Consult Note (Addendum)
Palliative Care Consult Note                                  Date: 05/25/2022   Patient Name: Chelsea Garcia  DOB: [birth year]  MRN: [id number]  Age / Sex: 89 y.o., female  PCP: Darrow Bussing, MD Referring Physician: Lanae Boast, MD  Reason for Consultation: Establishing goals of care  HPI/Patient Profile: 89 y.o. female  with past medical history of essential thrombocytosis (JAK2 positive and on hydroxyurea since 2015), anemia of chronic disease, glaucoma, and bronchiectasis who presented to the ED on 06/10/2022 with abdominal pain.  CT abdomen/pelvis showed distal perforated diverticulitis.  She was evaluated by general surgery and taken for exploratory laparotomy with LAR and end colostomy on 05/15/2022.  She has been on a clear liquid diet but now with worsening abdominal distention and concern for worsening ileus.  Changed to NPO status and NG tube placed for decompression on 05/25/2022.  Palliative Medicine has been consulted for goals of care.   Subjective:   I have reviewed medical records including progress notes, labs and imaging, discussed with Johnny Bridge PA with surgery, and assessed the patient at bedside. She appears acutely ill, weak, and frail. She is complaining of discomfort from the NG tube.   I met with her daughter-in-law Chelsea Garcia to discuss diagnosis, prognosis, GOC, disposition, and options. Chelsea Garcia reports that she is patient's HCPOA.  I introduced Palliative Medicine as specialized medical care for people living with serious illness. It focuses on providing relief from the symptoms and stress of a serious illness.   A brief life review was discussed.  Patient is a retired Associate Professor.  She has been married to her husband/Chelsea Garcia for 52 years.  They have 2 sons - Chelsea Garcia and Wall. Chelsea Garcia is [name]'s wife.   We discussed patient's functional status prior to admission. She was hospitalized in March 2024 with a fractured arm after a fall at home.  She discharged to rehab, then returned home. She regained most of her mobility and able to perform her ADLs.   We discussed patient's current illness and what it means in the larger context of her ongoing co-morbidities. Current clinical status was reviewed. Chelsea Garcia has an excellent understanding   Avis Epley understanding that the surgery on 5/5 was very high risk.  She reports that family was initially hopeful because patient not only survived surgery but seemed to be doing ok post-op. Chelsea Garcia states that unfortunately patient's bowels "never woke up". We discussed that TPN is the only option for nutritional support right now, but that it has significant risks.   Created space and opportunity for Chelsea Garcia to express her thoughts and feelings regarding patient's current medical situation. Values and goals of care were attempted to be elicited.  Chelsea Garcia states "I think she is dying". I shared with Chelsea Garcia that was my concern as well.   We discussed different paths of care - full scope versus limited interventions (treat the treatable) versus comfort care.  Chelsea Garcia seems to indicate that family is considering not continuing full scope care.  Chelsea Garcia plans to talk with patient's husband and both sons this evening, I let hew know that someone from PMT would follow-up tomorrow.   Review of Systems  Gastrointestinal:  Positive for abdominal distention.    Objective:   Primary Diagnoses: Present on Admission:  Essential thrombocytosis (HCC)  Normocytic anemia   Physical Exam Vitals reviewed.  Constitutional:  General: She is awake.     Appearance: She is ill-appearing.  Pulmonary:     Effort: Accessory muscle usage present.  Neurological:     Mental Status: She is oriented to person, place, and time.     Motor: Weakness present.     Vital Signs:  BP (!) 94/45 (BP Location: Left Arm)   Pulse 93   Temp 97.6 F (36.4 C) (Axillary)   Resp 18   Ht 4\' 10"  (1.473 m)   Wt 61 kg   SpO2 95%   BMI  28.11 kg/m   Palliative Assessment/Data: PPS 20%     Assessment & Plan:   SUMMARY OF RECOMMENDATIONS   DNR/DNI as previously documented Continue current interventions Daughter-in-law/[name] will be speaking with patient and family regarding next steps of care PMT will follow-up tomorrow  Primary Decision Maker: Patient can express her own wishes, but needs support from [place] for decision-making  Prognosis:  Very guarded   Discussed with: Dr. Jonathon Bellows and RN    Thank you for allowing [place] to participate in the care of Hassel Neth  MDM - High  Signed by: Sherlean Foot, NP Palliative Medicine Team  Team Phone # [phone]  For individual providers, please see AMION

## 2022-06-11 NOTE — Progress Notes (Signed)
PT Cancellation Note  Patient Details Name: Mariyam Galeski MRN: [id number] DOB: 1933/01/26   Cancelled Treatment:    Reason Eval/Treat Not Completed: (P) Fatigue/lethargy limiting ability to participate (Will continue efforts per PT plan of care as schedule permits once medically appropriate. Per NP, pt and family still in discussions with Palliative providers regarding GOC.)   Dorathy Kinsman Bentley Haralson 06/10/2022, 11:01 AM

## 2022-06-11 DEATH — deceased

## 2022-06-13 ENCOUNTER — Other Ambulatory Visit: Payer: Medicare Other

## 2022-06-29 ENCOUNTER — Ambulatory Visit: Payer: Medicare Other | Admitting: Podiatry

## 2022-07-28 ENCOUNTER — Ambulatory Visit: Payer: Medicare Other | Admitting: Hematology

## 2022-07-28 ENCOUNTER — Other Ambulatory Visit: Payer: Medicare Other

## 2022-11-30 IMAGING — CT CT ABD-PELV W/ CM
2 of 5 series · 15 of 46 positions shown, 17 images · IV contrast (omnipaque)
Comparison: 11/07/2019, 03/21/2017

CLINICAL DATA: Right lower quadrant pain since this morning, nausea

EXAM:
CT ABDOMEN AND PELVIS WITH CONTRAST
TECHNIQUE: Multidetector CT imaging of the abdomen and pelvis was performed
using the standard protocol following bolus administration of
intravenous contrast.
CONTRAST:  80mL OMNIPAQUE IOHEXOL 300 MG/ML  SOLN

[Series 2: axial st · axial · 0.77mm/px · z∈[+1002,+1367]mm · 12 of 85 slices shown, 14 images]
[im 6/85  soft-tissue]
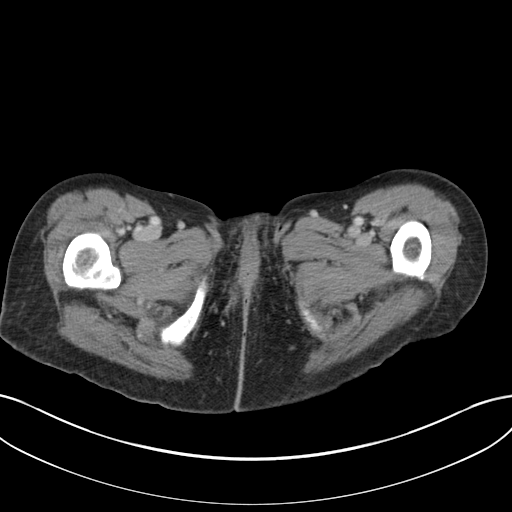
[im 6/85  bone]
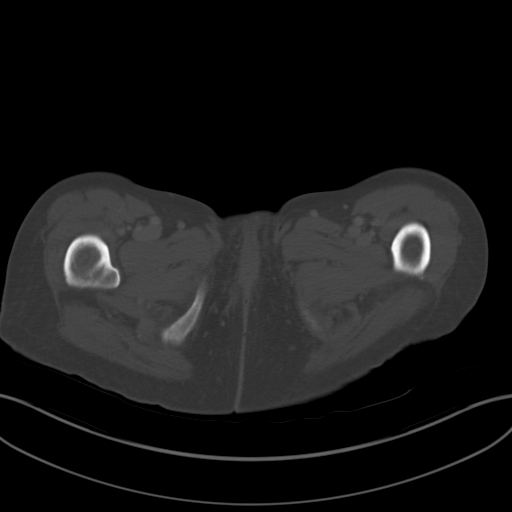
[im 12/85  soft-tissue]
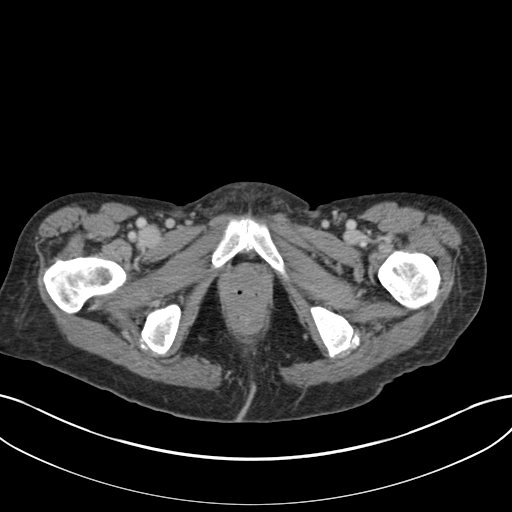
[im 17/85  soft-tissue]
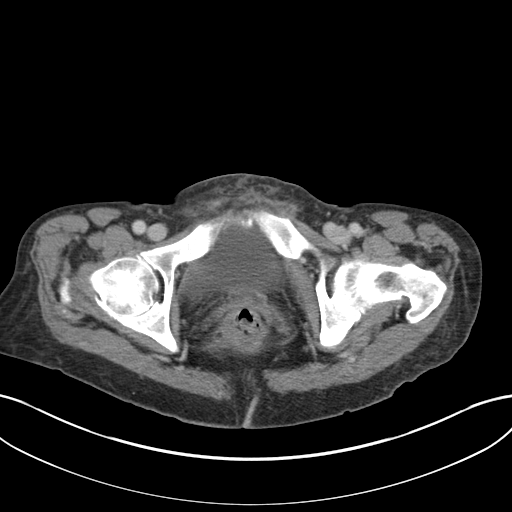
[im 29/85  soft-tissue]
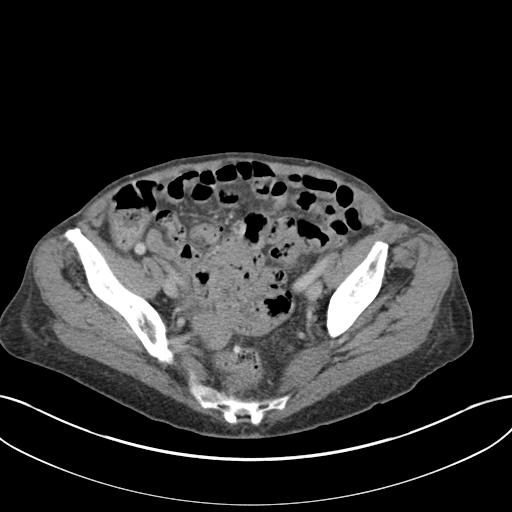
[im 34/85  soft-tissue]
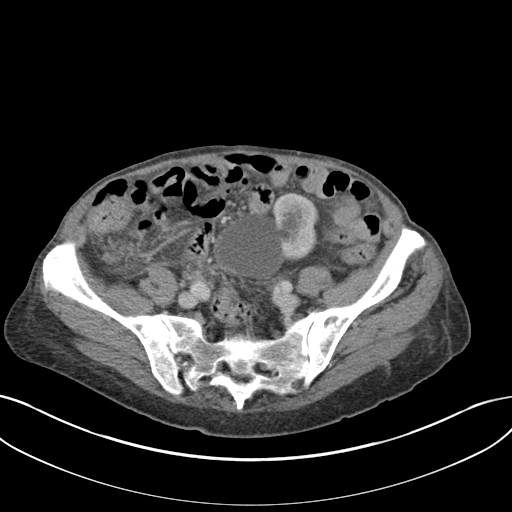
[im 40/85  soft-tissue]
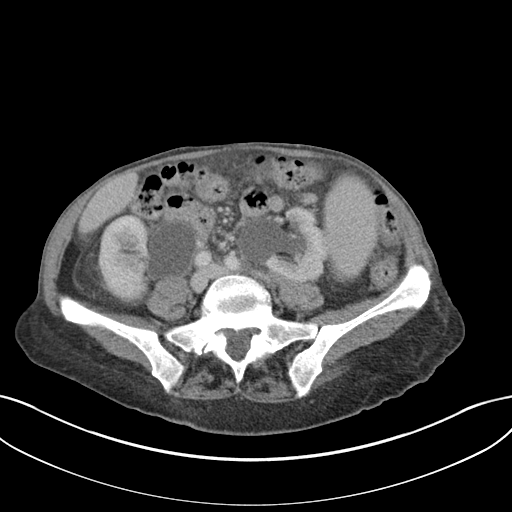
[im 45/85  soft-tissue]
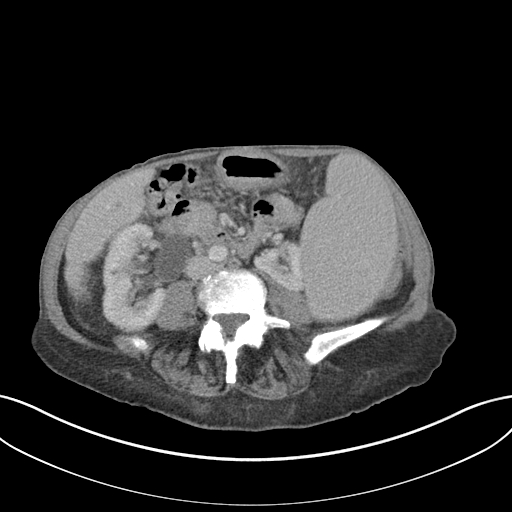
[im 51/85  soft-tissue]
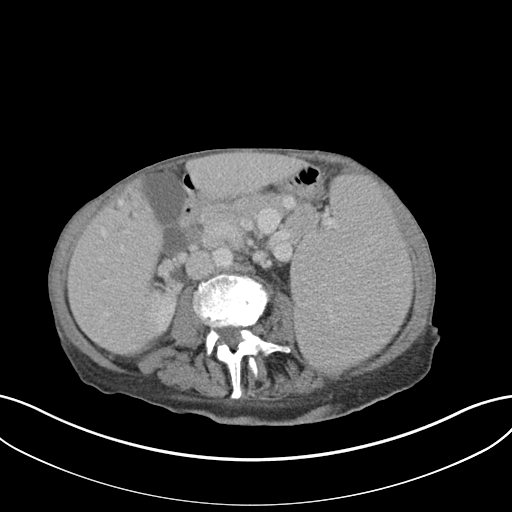
[im 57/85  soft-tissue]
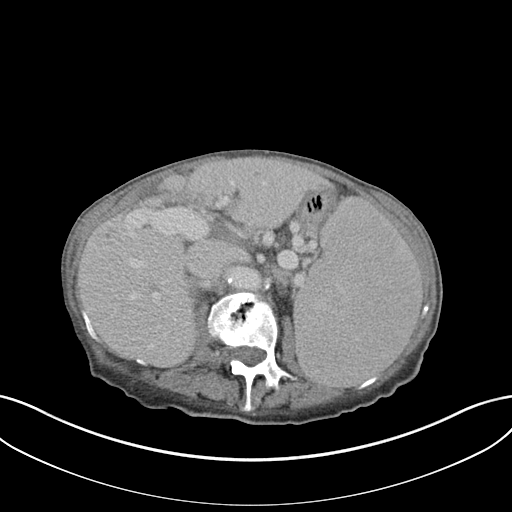
[im 57/85  bone]
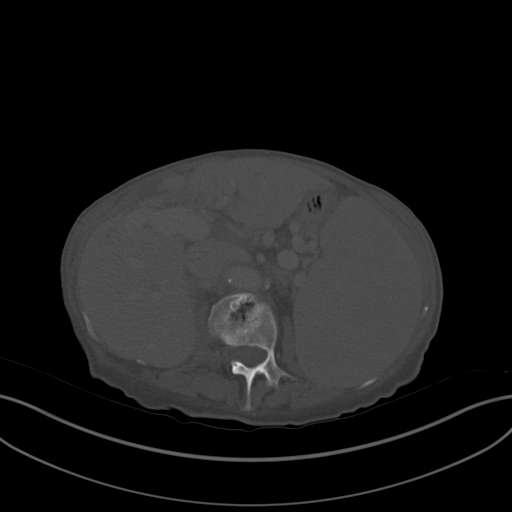
[im 68/85  soft-tissue]
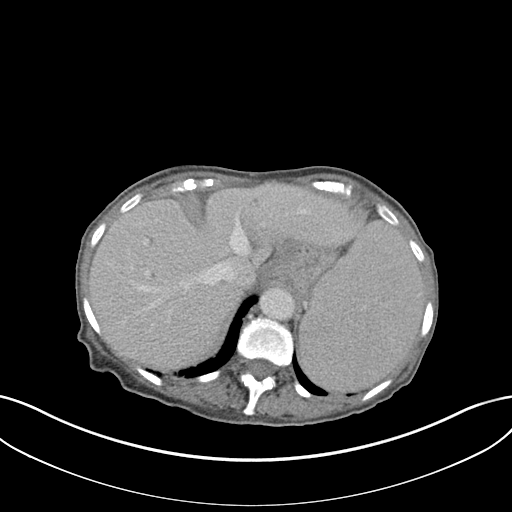
[im 73/85  soft-tissue]
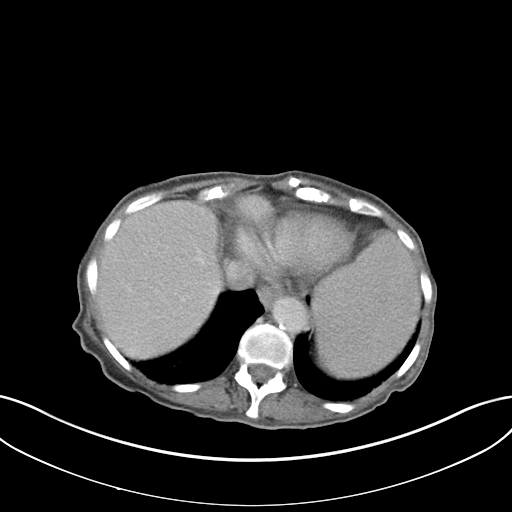
[im 79/85  soft-tissue]
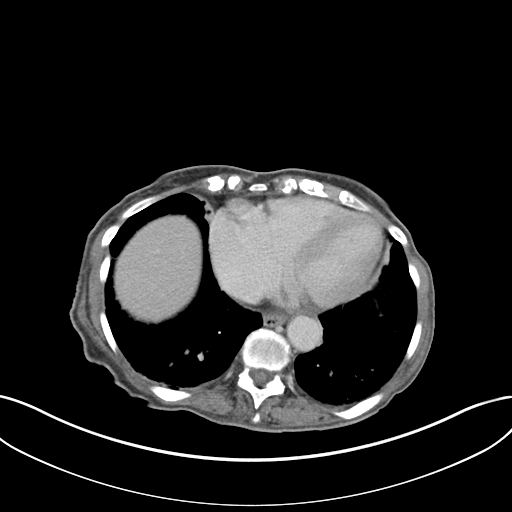

[Series 6: coronal st · coronal · 0.66mm/px · 3 of 124 slices shown]
[im 42/124  soft-tissue]
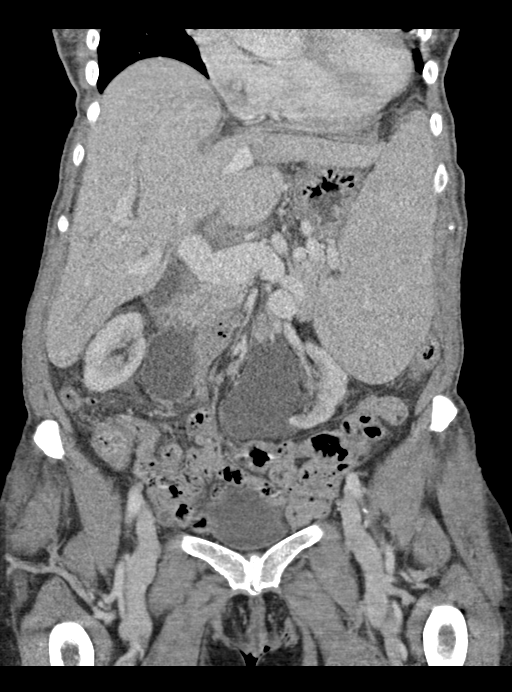
[im 55/124  soft-tissue]
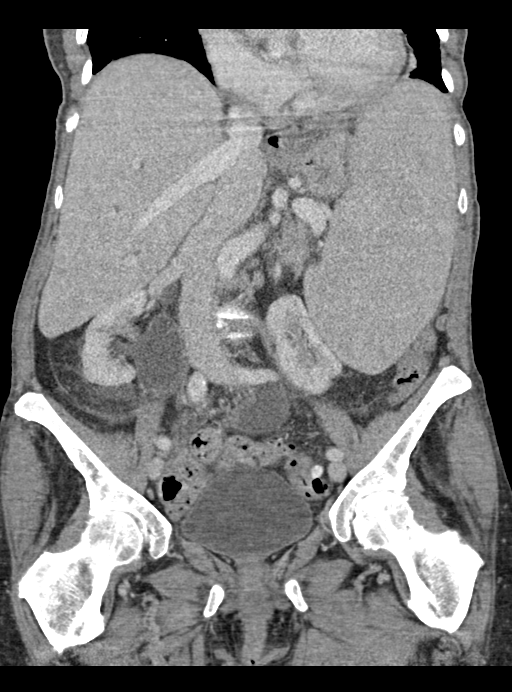
[im 69/124  soft-tissue]
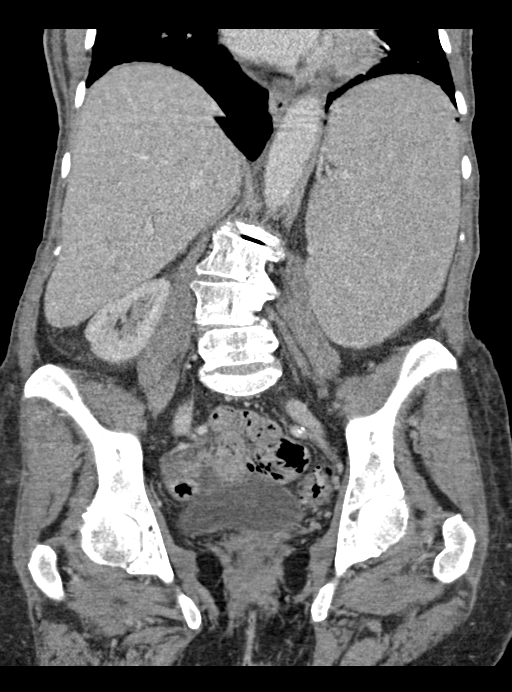

[15 of 46 positions shown; findings below may reference images not displayed]

FINDINGS: Lower chest: Heart is enlarged without pericardial effusion. No
acute pleural or parenchymal lung disease.

Hepatobiliary: No focal liver abnormality is seen. No gallstones,
gallbladder wall thickening, or biliary dilatation.

Pancreas: Unremarkable. No pancreatic ductal dilatation or
surrounding inflammatory changes.

Spleen: There is progressive massive splenomegaly, with the spleen
measuring 19.5 cm in craniocaudal length. No focal parenchymal
abnormalities.

Adrenals/Urinary Tract: There is mild bilateral renal cortical
atrophy. No focal parenchymal abnormalities. There is marked
distension of the bilateral renal pelves, left greater than right,
without frank hydronephrosis. This may reflect an element of
congenital UPJ narrowing. No urinary tract calculi. The adrenals and
bladder are normal.

Stomach/Bowel: No bowel obstruction or ileus. Diffuse diverticulosis
of the sigmoid colon without evidence of diverticulitis. Normal
retrocecal appendix. No bowel wall thickening or inflammatory
change.

Vascular/Lymphatic: Aortic atherosclerosis. No enlarged abdominal or
pelvic lymph nodes.

Reproductive: Status post hysterectomy. No adnexal masses.

Other: Trace free fluid within the lower pelvis. No free
intraperitoneal gas. No abdominal wall hernia.

Musculoskeletal: No acute or destructive bony lesions. Progressive
multilevel spondylosis and rotatory scoliosis. Reconstructed images
demonstrate no additional findings.
IMPRESSION: 1. Marked splenomegaly, which has progressed since prior study.
2. Sigmoid diverticulosis without diverticulitis.
3. Trace pelvic free fluid, nonspecific.
4. Large bilateral extrarenal pelves, without hydronephrosis.
5. Normal appendix.
